# Patient Record
Sex: Male | Born: 1955 | Race: White | Hispanic: No | Marital: Married | Smoking: Current every day smoker
Health system: Southern US, Community
[De-identification: ages and names within clinical notes are randomized; demographics above are authoritative.]

## PROBLEM LIST (undated history)

## (undated) DIAGNOSIS — K8020 Calculus of gallbladder without cholecystitis without obstruction: Secondary | ICD-10-CM

## (undated) DIAGNOSIS — I255 Ischemic cardiomyopathy: Secondary | ICD-10-CM

## (undated) DIAGNOSIS — I219 Acute myocardial infarction, unspecified: Secondary | ICD-10-CM

## (undated) DIAGNOSIS — F329 Major depressive disorder, single episode, unspecified: Secondary | ICD-10-CM

## (undated) DIAGNOSIS — M545 Low back pain: Secondary | ICD-10-CM

## (undated) DIAGNOSIS — I428 Other cardiomyopathies: Secondary | ICD-10-CM

## (undated) DIAGNOSIS — I639 Cerebral infarction, unspecified: Secondary | ICD-10-CM

## (undated) DIAGNOSIS — M5137 Other intervertebral disc degeneration, lumbosacral region: Secondary | ICD-10-CM

## (undated) DIAGNOSIS — I2510 Atherosclerotic heart disease of native coronary artery without angina pectoris: Secondary | ICD-10-CM

## (undated) DIAGNOSIS — Z95810 Presence of automatic (implantable) cardiac defibrillator: Secondary | ICD-10-CM

## (undated) DIAGNOSIS — J189 Pneumonia, unspecified organism: Secondary | ICD-10-CM

## (undated) DIAGNOSIS — G8929 Other chronic pain: Secondary | ICD-10-CM

## (undated) DIAGNOSIS — J449 Chronic obstructive pulmonary disease, unspecified: Secondary | ICD-10-CM

## (undated) DIAGNOSIS — C3490 Malignant neoplasm of unspecified part of unspecified bronchus or lung: Secondary | ICD-10-CM

## (undated) DIAGNOSIS — E785 Hyperlipidemia, unspecified: Secondary | ICD-10-CM

## (undated) DIAGNOSIS — M51379 Other intervertebral disc degeneration, lumbosacral region without mention of lumbar back pain or lower extremity pain: Secondary | ICD-10-CM

## (undated) DIAGNOSIS — I679 Cerebrovascular disease, unspecified: Secondary | ICD-10-CM

## (undated) DIAGNOSIS — M1990 Unspecified osteoarthritis, unspecified site: Secondary | ICD-10-CM

## (undated) DIAGNOSIS — I509 Heart failure, unspecified: Secondary | ICD-10-CM

## (undated) DIAGNOSIS — G4730 Sleep apnea, unspecified: Secondary | ICD-10-CM

## (undated) HISTORY — DX: Cerebrovascular disease, unspecified: I67.9

## (undated) HISTORY — DX: Other cardiomyopathies: I42.8

## (undated) HISTORY — DX: Ischemic cardiomyopathy: I25.5

## (undated) HISTORY — DX: Sleep apnea, unspecified: G47.30

## (undated) HISTORY — DX: Atherosclerotic heart disease of native coronary artery without angina pectoris: I25.10

## (undated) HISTORY — DX: Malignant neoplasm of unspecified part of unspecified bronchus or lung: C34.90

## (undated) HISTORY — DX: Calculus of gallbladder without cholecystitis without obstruction: K80.20

## (undated) HISTORY — PX: BACK SURGERY: SHX140

---

## 1994-06-26 HISTORY — PX: POSTERIOR LUMBAR FUSION: SHX6036

## 2000-06-26 HISTORY — PX: KNEE ARTHROSCOPY: SHX127

## 2003-12-18 ENCOUNTER — Emergency Department (HOSPITAL_COMMUNITY): Admission: EM | Admit: 2003-12-18 | Discharge: 2003-12-18 | Payer: Self-pay | Admitting: Emergency Medicine

## 2009-12-05 ENCOUNTER — Emergency Department (HOSPITAL_COMMUNITY)
Admission: EM | Admit: 2009-12-05 | Discharge: 2009-12-05 | Payer: Self-pay | Source: Home / Self Care | Admitting: Emergency Medicine

## 2009-12-31 ENCOUNTER — Ambulatory Visit (HOSPITAL_COMMUNITY): Admission: RE | Admit: 2009-12-31 | Discharge: 2009-12-31 | Payer: Self-pay | Admitting: Family Medicine

## 2010-01-07 ENCOUNTER — Ambulatory Visit (HOSPITAL_COMMUNITY)
Admission: RE | Admit: 2010-01-07 | Discharge: 2010-01-07 | Payer: Self-pay | Source: Home / Self Care | Admitting: Family Medicine

## 2010-06-04 ENCOUNTER — Emergency Department (HOSPITAL_COMMUNITY)
Admission: EM | Admit: 2010-06-04 | Discharge: 2010-06-05 | Payer: Self-pay | Source: Home / Self Care | Admitting: Emergency Medicine

## 2010-06-05 ENCOUNTER — Emergency Department (HOSPITAL_COMMUNITY)
Admission: EM | Admit: 2010-06-05 | Discharge: 2010-06-05 | Payer: Self-pay | Source: Home / Self Care | Admitting: Emergency Medicine

## 2010-06-06 ENCOUNTER — Emergency Department (HOSPITAL_COMMUNITY)
Admission: EM | Admit: 2010-06-06 | Discharge: 2010-06-06 | Payer: Self-pay | Source: Home / Self Care | Admitting: Emergency Medicine

## 2010-06-26 HISTORY — PX: LUMBAR DISC SURGERY: SHX700

## 2010-08-10 ENCOUNTER — Other Ambulatory Visit (HOSPITAL_COMMUNITY): Payer: Self-pay | Admitting: Neurosurgery

## 2010-08-10 DIAGNOSIS — M545 Low back pain: Secondary | ICD-10-CM

## 2010-08-11 ENCOUNTER — Other Ambulatory Visit (HOSPITAL_COMMUNITY): Payer: Self-pay | Admitting: Neurosurgery

## 2010-08-11 DIAGNOSIS — M545 Low back pain: Secondary | ICD-10-CM

## 2010-08-12 ENCOUNTER — Ambulatory Visit (HOSPITAL_COMMUNITY): Payer: Medicaid Other

## 2010-08-12 ENCOUNTER — Other Ambulatory Visit: Payer: Self-pay | Admitting: General Surgery

## 2010-08-12 ENCOUNTER — Ambulatory Visit (HOSPITAL_COMMUNITY): Admission: RE | Admit: 2010-08-12 | Payer: Medicaid Other | Source: Ambulatory Visit

## 2010-08-12 ENCOUNTER — Encounter (HOSPITAL_COMMUNITY): Payer: Medicaid Other | Attending: General Surgery

## 2010-08-12 DIAGNOSIS — Z01812 Encounter for preprocedural laboratory examination: Secondary | ICD-10-CM | POA: Insufficient documentation

## 2010-08-12 LAB — BASIC METABOLIC PANEL
BUN: 7 mg/dL (ref 6–23)
CO2: 28 mEq/L (ref 19–32)
Chloride: 104 mEq/L (ref 96–112)
Creatinine, Ser: 1.02 mg/dL (ref 0.4–1.5)
GFR calc Af Amer: >60 mL/min (ref >60)
GFR calc non Af Amer: >60 mL/min (ref >60)
Glucose, Bld: 76 mg/dL (ref 70–99)
Potassium: 4.6 mEq/L (ref 3.5–5.1)
Sodium: 138 mEq/L (ref 135–145)

## 2010-08-12 LAB — CBC
MCH: 31.7 pg (ref 26.0–34.0)
Platelets: 312 10*3/uL (ref 150–400)
WBC: 8.1 10*3/uL (ref 4.0–10.5)

## 2010-08-12 LAB — SURGICAL PCR SCREEN
MRSA, PCR: NEGATIVE
Staphylococcus aureus: NEGATIVE

## 2010-08-19 ENCOUNTER — Ambulatory Visit (HOSPITAL_COMMUNITY)
Admission: RE | Admit: 2010-08-19 | Discharge: 2010-08-19 | Disposition: A | Discharge status: Home / Self Care | Payer: Medicaid Other | Source: Ambulatory Visit | Attending: General Surgery | Admitting: General Surgery

## 2010-08-19 ENCOUNTER — Other Ambulatory Visit: Payer: Self-pay | Admitting: General Surgery

## 2010-08-19 ENCOUNTER — Ambulatory Visit (HOSPITAL_COMMUNITY)
Admission: RE | Admit: 2010-08-19 | Discharge: 2010-08-19 | Disposition: A | Discharge status: Home / Self Care | Payer: Medicaid Other | Source: Ambulatory Visit | Attending: Neurosurgery | Admitting: Neurosurgery

## 2010-08-19 DIAGNOSIS — M545 Low back pain, unspecified: Secondary | ICD-10-CM | POA: Insufficient documentation

## 2010-08-19 DIAGNOSIS — M48061 Spinal stenosis, lumbar region without neurogenic claudication: Secondary | ICD-10-CM | POA: Insufficient documentation

## 2010-08-19 DIAGNOSIS — L723 Sebaceous cyst: Secondary | ICD-10-CM | POA: Insufficient documentation

## 2010-08-19 DIAGNOSIS — M519 Unspecified thoracic, thoracolumbar and lumbosacral intervertebral disc disorder: Secondary | ICD-10-CM | POA: Insufficient documentation

## 2010-08-19 DIAGNOSIS — M5126 Other intervertebral disc displacement, lumbar region: Secondary | ICD-10-CM | POA: Insufficient documentation

## 2010-09-01 ENCOUNTER — Other Ambulatory Visit: Payer: Self-pay | Admitting: Neurosurgery

## 2010-09-01 DIAGNOSIS — M5126 Other intervertebral disc displacement, lumbar region: Secondary | ICD-10-CM

## 2010-09-02 ENCOUNTER — Ambulatory Visit
Admission: RE | Admit: 2010-09-02 | Discharge: 2010-09-02 | Disposition: A | Discharge status: Home / Self Care | Payer: Medicaid Other | Source: Ambulatory Visit | Attending: Neurosurgery | Admitting: Neurosurgery

## 2010-09-02 DIAGNOSIS — M5126 Other intervertebral disc displacement, lumbar region: Secondary | ICD-10-CM

## 2010-09-02 NOTE — H&P (Signed)
  NAMEDAVIER, TRAMELL               ACCOUNT NO.:  [id number]  MEDICAL RECORD NO.:  [id number]           PATIENT TYPE:  LOCATION:                                 FACILITY:  PHYSICIAN:  Tilford Pillar, MD      DATE OF BIRTH:  [birth year]  DATE OF ADMISSION: DATE OF DISCHARGE:  LH                             HISTORY & PHYSICAL   CHIEF COMPLAINT:  Knot under right earlobe.  HISTORY OF PRESENT ILLNESS:  The patient is a 55 year old male with suddenly a history of a knot underneath his right earlobe, this had not significantly changed in size.  He has had no discharge.  No erythema. No significant pain symptomatology.  This is become more of a new symptoms and irritation, concern due to the size.  PAST MEDICAL HISTORY: 1. Hypertension. 2. Back pain. 3. Hypercholesteremia.  PAST SURGICAL HISTORY:  Back surgery.  MEDICATIONS:  Cyclobenzaprine, hydrocodone, Zocor, has been on a recent prednisone.  ALLERGIES:  MORPHINE, which causes itching.  SOCIAL HISTORY:  Two-pack per day smoker.  No alcohol.  No recreational drug abuse.  He is currently not employed.  PERTINENT FAMILY HISTORY:  Diabetes mellitus, otherwise unremarkable.  REVIEW OF SYSTEMS:  CONSTITUTIONAL:  Unremarkable.  EYES:  Unremarkable. EARS, NOSE, AND THROAT:  Complaints of rhinorrhea.  RESPIRATORY:  Cough and wheezing.  CARDIOVASCULAR:  Unremarkable.  GASTROINTESTINAL: Unremarkable.  GENITOURINARY:  Unremarkable.  MUSCULOSKELETAL: Arthralgias of the back, neck and joints.  SKIN:  Unremarkable. ENDOCRINE:  Unremarkable.  NEUROLOGIC:  Paresthesias of bilateral legs.  PHYSICAL EXAMINATION:  GENERAL:  The patient is healthy, calm-appearing male.  No acute distress, he is alert and oriented x3. HEENT:  Scalp, no deformities, no masses.  Eyes; pupils are equal, round, and reactive to light.  Extraocular movements are intact.  No scleral icterus or conjunctival pallor is noted along the inferior aspect of the right  earlobe.  There is a mobile, soft, nontender, rubbery nodule, no discharges noted.  Oral mucosa is pink.  Normal occlusion. NECK:  Trachea is midline.  No cervical lymphadenopathy. PULMONARY:  Unlabored respiration.  No wheezes or crackles.  He is clear to auscultation bilaterally. CARDIOVASCULAR:  Regular rate and rhythm.  No murmurs or gallops are apparent.  He has no chest wall deformities. ABDOMEN:  Positive bowel sounds.  Abdomen is soft and nontender. EXTREMITIES:  Warm and dry.  ASSESSMENT AND PLAN:  Sebaceous cyst, subauricular of the right face.  Plan at this point is to proceed with excision.  The risks, benefits, and alternatives were discussed at length with the patient including both excision and continued conservative measures.  At this point, the patient does wish to proceed with excision and we will plan to proceed at his earliest convenience.     Tilford Pillar, MD     BZ/MEDQ  D:  08/18/2010  T:  08/18/2010  Job:  [id number]  Electronically Signed by Tilford Pillar MD on 09/01/2010 09:30:42 PM

## 2010-09-05 LAB — BASIC METABOLIC PANEL
Glucose, Bld: 115 mg/dL — ABNORMAL HIGH (ref 70–99)
Potassium: 4.5 mEq/L (ref 3.5–5.1)

## 2010-09-05 LAB — CBC
Hemoglobin: 15.6 g/dL (ref 13.0–17.0)
MCHC: 37.1 g/dL — ABNORMAL HIGH (ref 30.0–36.0)
MCV: 93.3 fL (ref 78.0–100.0)
WBC: 11.9 10*3/uL — ABNORMAL HIGH (ref 4.0–10.5)

## 2010-09-05 LAB — DIFFERENTIAL
Basophils Absolute: 0.1 10*3/uL (ref 0.0–0.1)
Basophils Relative: 1 % (ref 0–1)
Eosinophils Absolute: 0.2 10*3/uL (ref 0.0–0.7)
Lymphs Abs: 2.7 10*3/uL (ref 0.7–4.0)
Monocytes Relative: 8 % (ref 3–12)
Neutrophils Relative %: 66 % (ref 43–77)

## 2010-09-05 LAB — HEPATIC FUNCTION PANEL
AST: 33 U/L (ref 0–37)
Albumin: 4 g/dL (ref 3.5–5.2)
Alkaline Phosphatase: 76 U/L (ref 39–117)
Indirect Bilirubin: 0.4 mg/dL (ref 0.3–0.9)
Total Bilirubin: 0.5 mg/dL (ref 0.3–1.2)

## 2010-09-06 LAB — CBC
HCT: 39.5 % (ref 39.0–52.0)
Hemoglobin: 13.6 g/dL (ref 13.0–17.0)
MCH: 32.7 pg (ref 26.0–34.0)
MCHC: 34.4 g/dL (ref 30.0–36.0)
Platelets: 188 10*3/uL (ref 150–400)

## 2010-09-06 LAB — DIFFERENTIAL
Basophils Relative: 1 % (ref 0–1)
Eosinophils Absolute: 0.2 10*3/uL (ref 0.0–0.7)
Eosinophils Relative: 2 % (ref 0–5)
Monocytes Absolute: 0.7 10*3/uL (ref 0.1–1.0)
Monocytes Relative: 7 % (ref 3–12)
Neutro Abs: 7.3 10*3/uL (ref 1.7–7.7)

## 2010-09-06 LAB — POCT I-STAT, CHEM 8
HCT: 42 % (ref 39.0–52.0)
Hemoglobin: 14.3 g/dL (ref 13.0–17.0)
Potassium: 4.2 mEq/L (ref 3.5–5.1)

## 2010-09-23 NOTE — Op Note (Signed)
  NAMEGREELY, Charles Savage               ACCOUNT NO.:  [id number]  MEDICAL RECORD NO.:  [id number]           PATIENT TYPE:  LOCATION:                                 FACILITY:  PHYSICIAN:  Tilford Pillar, MD      DATE OF BIRTH:  08/02/1955  DATE OF PROCEDURE:  08/18/2010 DATE OF DISCHARGE:                              OPERATIVE REPORT   PREOPERATIVE DIAGNOSIS:  Sebaceous cyst of the right subauricular face.  POSTOPERATIVE DIAGNOSIS:  Sebaceous cyst of the right subauricular face.  PROCEDURE:  Excision of cyst via 2-cm incision.  SURGEON:  Tilford Pillar, MD  ANESTHESIA:  MAC sedation with local anesthetic.  Local anesthetic 1% lidocaine plain.  SPECIMEN:  Cyst.  ESTIMATED BLOOD LOSS:  Minimal.  COMPLICATIONS:  None.  INDICATIONS:  The patient is a 55 year old black male who presented to my office with a history of a nodule on his right ear.  This had slowly increased in size, and on evaluation, was consistent with a sebaceous cyst.  Risks, benefits and alternatives of excision were discussed at length with the patient including, but not limited to risk of bleeding, infection and recurrence.  His questions and concerns were addressed. The patient was consented for planned procedure.  OPERATION:  The patient was taken to the operating room, placed in supine position on the operating table at which time the MAC sedation was administered.  Once he was asleep, his right ear and face were prepped with Betadine solution and draped in standard fashion.  Local anesthetic was instilled and an elliptical incision was created over the cyst.  Careful dissection was carried out using combination of electrocautery and sharp dissection to free the cyst circumferentially. Once it was free, it was placed into the back table and sent as a permanent specimen to Pathology.  At this time, hemostasis was excellent.  I used a 3-0 Vicryl for the deep subcutaneous tissue and then 4-0 Monocryl in a  running subcuticular suture to reapproximate the skin edges.  Skin was washed, dried with moist dry towel.  Dermabond was placed over the wound and the patient was allowed to come out of sedation, he was transferred back to recovery area in stable condition.  At the conclusion of procedure, all instrument, sponge and needle counts were correct.  The patient tolerated the procedure extremely well.     Tilford Pillar, MD     BZ/MEDQ  D:  09/02/2010  T:  09/03/2010  Job:  [id number]  cc:   Primary Care Physician  Electronically Signed by Tilford Pillar MD on 09/23/2010 12:20:17 PM

## 2010-09-29 ENCOUNTER — Other Ambulatory Visit: Payer: Self-pay | Admitting: Neurosurgery

## 2010-09-29 DIAGNOSIS — M5126 Other intervertebral disc displacement, lumbar region: Secondary | ICD-10-CM

## 2010-10-06 ENCOUNTER — Ambulatory Visit
Admission: RE | Admit: 2010-10-06 | Discharge: 2010-10-06 | Disposition: A | Discharge status: Home / Self Care | Payer: Medicaid Other | Source: Ambulatory Visit | Attending: Neurosurgery | Admitting: Neurosurgery

## 2010-10-06 DIAGNOSIS — M5126 Other intervertebral disc displacement, lumbar region: Secondary | ICD-10-CM

## 2010-10-07 ENCOUNTER — Other Ambulatory Visit: Payer: Medicaid Other

## 2010-10-07 ENCOUNTER — Other Ambulatory Visit (HOSPITAL_COMMUNITY): Payer: Self-pay | Admitting: Family Medicine

## 2010-10-07 DIAGNOSIS — R9389 Abnormal findings on diagnostic imaging of other specified body structures: Secondary | ICD-10-CM

## 2010-10-11 ENCOUNTER — Other Ambulatory Visit (HOSPITAL_COMMUNITY): Payer: Medicaid Other

## 2010-10-14 ENCOUNTER — Ambulatory Visit (HOSPITAL_COMMUNITY)
Admission: RE | Admit: 2010-10-14 | Discharge: 2010-10-14 | Disposition: A | Discharge status: Home / Self Care | Payer: Medicaid Other | Source: Ambulatory Visit | Attending: Family Medicine | Admitting: Family Medicine

## 2010-10-14 DIAGNOSIS — F17200 Nicotine dependence, unspecified, uncomplicated: Secondary | ICD-10-CM | POA: Insufficient documentation

## 2010-10-14 DIAGNOSIS — J479 Bronchiectasis, uncomplicated: Secondary | ICD-10-CM | POA: Insufficient documentation

## 2010-10-14 DIAGNOSIS — J984 Other disorders of lung: Secondary | ICD-10-CM | POA: Insufficient documentation

## 2010-10-14 DIAGNOSIS — R918 Other nonspecific abnormal finding of lung field: Secondary | ICD-10-CM | POA: Insufficient documentation

## 2010-10-14 DIAGNOSIS — R9389 Abnormal findings on diagnostic imaging of other specified body structures: Secondary | ICD-10-CM

## 2010-11-08 ENCOUNTER — Other Ambulatory Visit: Payer: Self-pay | Admitting: Neurosurgery

## 2010-11-08 DIAGNOSIS — M5126 Other intervertebral disc displacement, lumbar region: Secondary | ICD-10-CM

## 2010-11-11 ENCOUNTER — Ambulatory Visit
Admission: RE | Admit: 2010-11-11 | Discharge: 2010-11-11 | Disposition: A | Discharge status: Home / Self Care | Payer: Medicaid Other | Source: Ambulatory Visit | Attending: Neurosurgery | Admitting: Neurosurgery

## 2010-11-11 DIAGNOSIS — M5126 Other intervertebral disc displacement, lumbar region: Secondary | ICD-10-CM

## 2011-02-10 ENCOUNTER — Other Ambulatory Visit (HOSPITAL_COMMUNITY): Payer: Self-pay | Admitting: Internal Medicine

## 2011-02-10 DIAGNOSIS — J449 Chronic obstructive pulmonary disease, unspecified: Secondary | ICD-10-CM

## 2011-02-13 ENCOUNTER — Ambulatory Visit (HOSPITAL_COMMUNITY): Payer: Medicaid Other

## 2011-02-16 ENCOUNTER — Ambulatory Visit (HOSPITAL_COMMUNITY)
Admission: RE | Admit: 2011-02-16 | Discharge: 2011-02-16 | Disposition: A | Discharge status: Home / Self Care | Payer: Medicaid Other | Source: Ambulatory Visit | Attending: Internal Medicine | Admitting: Internal Medicine

## 2011-02-16 ENCOUNTER — Encounter (HOSPITAL_COMMUNITY): Payer: Self-pay

## 2011-02-16 DIAGNOSIS — F17200 Nicotine dependence, unspecified, uncomplicated: Secondary | ICD-10-CM | POA: Insufficient documentation

## 2011-02-16 DIAGNOSIS — J449 Chronic obstructive pulmonary disease, unspecified: Secondary | ICD-10-CM | POA: Insufficient documentation

## 2011-02-16 DIAGNOSIS — J984 Other disorders of lung: Secondary | ICD-10-CM | POA: Insufficient documentation

## 2011-02-16 DIAGNOSIS — R0789 Other chest pain: Secondary | ICD-10-CM | POA: Insufficient documentation

## 2011-02-16 DIAGNOSIS — J4489 Other specified chronic obstructive pulmonary disease: Secondary | ICD-10-CM | POA: Insufficient documentation

## 2011-02-16 DIAGNOSIS — R0602 Shortness of breath: Secondary | ICD-10-CM | POA: Insufficient documentation

## 2011-02-16 MED ORDER — IOHEXOL 300 MG/ML  SOLN
80.0000 mL | Freq: Once | INTRAMUSCULAR | Status: AC | PRN
  Administered 2011-02-16: 80 mL via INTRAVENOUS

## 2011-02-22 ENCOUNTER — Encounter (HOSPITAL_COMMUNITY): Payer: Self-pay | Admitting: *Deleted

## 2011-02-22 ENCOUNTER — Emergency Department (HOSPITAL_COMMUNITY)
Admission: EM | Admit: 2011-02-22 | Discharge: 2011-02-23 | Disposition: A | Discharge status: Home / Self Care | Payer: Medicaid Other | Attending: Emergency Medicine | Admitting: Emergency Medicine

## 2011-02-22 DIAGNOSIS — F17200 Nicotine dependence, unspecified, uncomplicated: Secondary | ICD-10-CM | POA: Insufficient documentation

## 2011-02-22 DIAGNOSIS — L272 Dermatitis due to ingested food: Secondary | ICD-10-CM | POA: Insufficient documentation

## 2011-02-22 DIAGNOSIS — T7840XA Allergy, unspecified, initial encounter: Secondary | ICD-10-CM

## 2011-02-22 MED ORDER — FAMOTIDINE IN NACL 20-0.9 MG/50ML-% IV SOLN
20.0000 mg | Freq: Once | INTRAVENOUS | Status: AC
  Administered 2011-02-22: 20 mg via INTRAVENOUS
  Filled 2011-02-22: qty 50

## 2011-02-22 MED ORDER — METHYLPREDNISOLONE SODIUM SUCC 125 MG IJ SOLR
125.0000 mg | Freq: Once | INTRAMUSCULAR | Status: AC
  Administered 2011-02-22: 125 mg via INTRAVENOUS
  Filled 2011-02-22: qty 2

## 2011-02-22 MED ORDER — DIPHENHYDRAMINE HCL 50 MG/ML IJ SOLN
25.0000 mg | Freq: Once | INTRAMUSCULAR | Status: AC
  Administered 2011-02-22: 25 mg via INTRAVENOUS
  Filled 2011-02-22: qty 1

## 2011-02-22 NOTE — ED Notes (Signed)
Pt states he ate a peach pie and 10 mins later broke out in rash and itching all over; pt states he throat feels very dry

## 2011-02-23 MED ORDER — PREDNISONE 10 MG PO TABS: 20.0000 mg | ORAL_TABLET | Freq: Every day | ORAL | Status: AC

## 2011-02-23 NOTE — ED Provider Notes (Signed)
History     CSN: [id number] Arrival date & time: 02/22/2011 11:15 PM  Chief Complaint  Patient presents with  . Allergic Reaction   HPI Comments: Seen 0003.  Patient is a 55 y.o. male presenting with allergic reaction. The history is provided by the patient and the spouse.  Allergic Reaction The primary symptoms are  rash and urticaria. The primary symptoms do not include wheezing, shortness of breath, cough, abdominal pain, nausea, vomiting, diarrhea, dizziness, palpitations or altered mental status. Primary symptoms comment: Patient ate a peach cobler pie and within 30 minutes developed severe itching and hives. The current episode started less than 1 hour ago. The problem has been rapidly worsening. This is a new problem.  The rash began today. Location: over entire body. The rash is associated with itching.  The urticaria began less than 1 hour ago.  The onset of the reaction was associated with eating. Significant symptoms also include itching. Significant symptoms that are not present include eye redness, flushing or rhinorrhea.    History reviewed. No pertinent past medical history.  Past Surgical History  Procedure Date  . Back surgery     History reviewed. No pertinent family history.  History  Substance Use Topics  . Smoking status: Current Everyday Smoker  . Smokeless tobacco: Not on file  . Alcohol Use: No      Review of Systems  HENT: Negative for rhinorrhea.   Eyes: Negative for redness.  Respiratory: Negative for cough, choking, chest tightness, shortness of breath and wheezing.   Cardiovascular: Negative for chest pain and palpitations.  Gastrointestinal: Negative for nausea, vomiting, abdominal pain and diarrhea.  Skin: Positive for itching and rash. Negative for flushing.       itching  Neurological: Negative for dizziness.  Psychiatric/Behavioral: Negative for altered mental status.  All other systems reviewed and are negative.    Physical Exam    BP 128/75  Pulse 111  Resp 20  Ht 5\' 9"  (1.753 m)  Wt 209 lb (94.802 kg)  BMI 30.86 kg/m2  SpO2 98%  Physical Exam  Nursing note and vitals reviewed. Constitutional: He is oriented to person, place, and time. He appears well-developed and well-nourished.       Itching and scratching.  HENT:  Head: Normocephalic and atraumatic.  Nose: Nose normal.  Mouth/Throat: Oropharynx is clear and moist.       No stridor  Eyes: EOM are normal.  Neck: Normal range of motion. Neck supple.  Cardiovascular: Normal rate, normal heart sounds and intact distal pulses.   Pulmonary/Chest: Effort normal.  Abdominal: Bowel sounds are normal.  Musculoskeletal: Normal range of motion.  Neurological: He is alert and oriented to person, place, and time.  Skin:       Hives to trunk, arms, legs, neck.  Psychiatric: He has a normal mood and affect.    ED Course  Procedures  Patient with acute allergic response after eating peach cobbler. Received solumedrol, pepcid, benadyl. Urticaria resolved. Itching improved.Patient observed in the ER. Pt feels improved after observation and/or treatment in ED. Pt stable in ED with no significant deterioration in condition.  MDM Reviewed: nursing note and vitals Total time providing critical care: 30-74 minutes. This excludes time spent performing separately reportable procedures and services.         Nicoletta Dress. Colon Branch, MD 02/23/11 [id number]

## 2011-02-23 NOTE — ED Notes (Signed)
Pt sleeping. 

## 2011-02-23 NOTE — ED Notes (Signed)
Pt self ambulated out with a steady gait stating no needs 

## 2011-05-24 ENCOUNTER — Ambulatory Visit (HOSPITAL_COMMUNITY)
Admission: RE | Admit: 2011-05-24 | Discharge: 2011-05-24 | Disposition: A | Discharge status: Home / Self Care | Payer: Medicaid Other | Source: Ambulatory Visit | Attending: Specialist | Admitting: Specialist

## 2011-05-24 ENCOUNTER — Other Ambulatory Visit (HOSPITAL_COMMUNITY): Payer: Self-pay | Admitting: Specialist

## 2011-05-24 DIAGNOSIS — IMO0002 Reserved for concepts with insufficient information to code with codable children: Secondary | ICD-10-CM

## 2011-05-24 DIAGNOSIS — S0550XA Penetrating wound with foreign body of unspecified eyeball, initial encounter: Secondary | ICD-10-CM | POA: Insufficient documentation

## 2011-05-24 DIAGNOSIS — X58XXXA Exposure to other specified factors, initial encounter: Secondary | ICD-10-CM | POA: Insufficient documentation

## 2011-06-01 ENCOUNTER — Encounter (HOSPITAL_BASED_OUTPATIENT_CLINIC_OR_DEPARTMENT_OTHER): Payer: Self-pay | Admitting: *Deleted

## 2011-06-01 NOTE — Progress Notes (Signed)
Pt had lumbar surg 9/12 [facility] Still severe back pain Has a piece metal in lt cheek needs to come out so he can have an mri No labs per anesth needed

## 2011-06-01 NOTE — H&P (Signed)
Charles Savage is an 55 y.o. male.   Chief Complaint: foreign body to face HPI:   Past Medical History  Diagnosis Date  . Arthritis   . Chronic pain     back pain    Past Surgical History  Procedure Date  . Back surgery 1610,9604  . Knee arthroscopy 2002    rt    History reviewed. No pertinent family history. Social History:  reports that he has been smoking.  He does not have any smokeless tobacco history on file. He reports that he does not drink alcohol or use illicit drugs.  Allergies:  Allergies  Allergen Reactions  . Morphine And Related Hives and Itching    No current facility-administered medications on file as of .   Medications Prior to Admission  Medication Sig Dispense Refill  . cyclobenzaprine (FLEXERIL) 10 MG tablet Take 10 mg by mouth 3 (three) times daily as needed.        Marland Kitchen HYDROcodone-acetaminophen (NORCO) 10-325 MG per tablet Take 1 tablet by mouth every 6 (six) hours as needed.        . meloxicam (MOBIC) 15 MG tablet Take 15 mg by mouth as needed.          No results found for this or any previous visit (from the past 48 hour(s)). No results found.  Review of Systems  Constitutional: Negative for fever, chills, weight loss, malaise/fatigue and diaphoresis.  HENT: Negative for hearing loss, ear pain, nosebleeds, congestion, sore throat, neck pain, tinnitus and ear discharge.   Eyes: Positive for double vision. Negative for blurred vision, photophobia, pain, discharge and redness.  Respiratory: Negative for cough, hemoptysis, sputum production, shortness of breath, wheezing and stridor.   Cardiovascular: Negative for chest pain, palpitations, orthopnea, claudication, leg swelling and PND.  Gastrointestinal: Negative for heartburn, nausea, vomiting, abdominal pain, diarrhea, constipation, blood in stool and melena.  Genitourinary: Negative for dysuria, urgency, frequency, hematuria and flank pain.  Musculoskeletal: Positive for back pain and joint  pain. Negative for myalgias and falls.  Skin: Negative for itching and rash.  Neurological: Negative for dizziness, tingling, tremors, sensory change, speech change, focal weakness, seizures, loss of consciousness, weakness and headaches.  Endo/Heme/Allergies: Negative for environmental allergies and polydipsia. Does not bruise/bleed easily.  Psychiatric/Behavioral: Negative for depression, suicidal ideas, hallucinations, memory loss and substance abuse. The patient is not nervous/anxious and does not have insomnia.   All other systems reviewed and are negative.    There were no vitals taken for this visit. Physical Exam  Constitutional: He is oriented to person, place, and time. He appears well-developed and well-nourished.  HENT:  Head: Normocephalic and atraumatic.    Right Ear: External ear normal.  Left Ear: External ear normal.  Nose: Nose normal.  Mouth/Throat: Oropharynx is clear and moist. No oropharyngeal exudate.  Eyes: Conjunctivae are normal. Pupils are equal, round, and reactive to light. Right eye exhibits no discharge. Left eye exhibits no discharge. No scleral icterus.  Neck: Normal range of motion. No JVD present. No tracheal deviation present. No thyromegaly present.  Cardiovascular: Normal rate, regular rhythm, normal heart sounds and intact distal pulses.  Exam reveals no gallop and no friction rub.   No murmur heard. Respiratory: Effort normal and breath sounds normal. No respiratory distress. He has no wheezes. He has no rales. He exhibits no tenderness.  GI: Soft. Bowel sounds are normal. He exhibits no distension and no mass. There is no tenderness. There is no rebound and no guarding.  Musculoskeletal: Normal range of motion. He exhibits no edema and no tenderness.  Lymphadenopathy:    He has no cervical adenopathy.  Neurological: He is alert and oriented to person, place, and time. He has normal reflexes. He displays normal reflexes. No cranial nerve deficit.  He exhibits normal muscle tone. Coordination normal.  Skin: Skin is warm and dry. No rash noted. No erythema. No pallor.  Psychiatric: He has a normal mood and affect. His behavior is normal. Judgment and thought content normal.     Assessment/Plan Removal of foreign body and repair of fracture  Dyanara Cozza L 06/01/2011, 12:09 PM

## 2011-06-02 ENCOUNTER — Encounter (HOSPITAL_BASED_OUTPATIENT_CLINIC_OR_DEPARTMENT_OTHER)
Admission: RE | Admit: 2011-06-02 | Discharge: 2011-06-02 | Disposition: A | Discharge status: Home / Self Care | Payer: Medicaid Other | Source: Ambulatory Visit | Attending: Specialist | Admitting: Specialist

## 2011-06-02 LAB — CBC
Hemoglobin: 14.4 g/dL (ref 13.0–17.0)
MCH: 33.2 pg (ref 26.0–34.0)
MCHC: 35.8 g/dL (ref 30.0–36.0)
Platelets: 263 10*3/uL (ref 150–400)
RDW: 13.1 % (ref 11.5–15.5)

## 2011-06-02 LAB — BASIC METABOLIC PANEL
BUN: 8 mg/dL (ref 6–23)
CO2: 27 mEq/L (ref 19–32)
Chloride: 103 mEq/L (ref 96–112)
Creatinine, Ser: 0.81 mg/dL (ref 0.50–1.35)
Potassium: 4.3 mEq/L (ref 3.5–5.1)
Sodium: 138 mEq/L (ref 135–145)

## 2011-06-05 ENCOUNTER — Encounter (HOSPITAL_BASED_OUTPATIENT_CLINIC_OR_DEPARTMENT_OTHER)
Admission: RE | Disposition: A | Discharge status: Home / Self Care | Payer: Self-pay | Source: Ambulatory Visit | Attending: Specialist

## 2011-06-05 ENCOUNTER — Other Ambulatory Visit: Payer: Self-pay | Admitting: Specialist

## 2011-06-05 ENCOUNTER — Ambulatory Visit (HOSPITAL_BASED_OUTPATIENT_CLINIC_OR_DEPARTMENT_OTHER): Payer: Medicaid Other | Admitting: Anesthesiology

## 2011-06-05 ENCOUNTER — Ambulatory Visit (HOSPITAL_BASED_OUTPATIENT_CLINIC_OR_DEPARTMENT_OTHER)
Admission: RE | Admit: 2011-06-05 | Discharge: 2011-06-05 | Disposition: A | Discharge status: Home / Self Care | Payer: Medicaid Other | Source: Ambulatory Visit | Attending: Specialist | Admitting: Specialist

## 2011-06-05 ENCOUNTER — Encounter (HOSPITAL_BASED_OUTPATIENT_CLINIC_OR_DEPARTMENT_OTHER): Payer: Self-pay | Admitting: Anesthesiology

## 2011-06-05 ENCOUNTER — Encounter (HOSPITAL_BASED_OUTPATIENT_CLINIC_OR_DEPARTMENT_OTHER): Payer: Self-pay | Admitting: Certified Registered"

## 2011-06-05 ENCOUNTER — Encounter (HOSPITAL_BASED_OUTPATIENT_CLINIC_OR_DEPARTMENT_OTHER): Payer: Self-pay

## 2011-06-05 DIAGNOSIS — Z01812 Encounter for preprocedural laboratory examination: Secondary | ICD-10-CM | POA: Insufficient documentation

## 2011-06-05 DIAGNOSIS — H0550 Retained (old) foreign body following penetrating wound of unspecified orbit: Secondary | ICD-10-CM | POA: Insufficient documentation

## 2011-06-05 HISTORY — DX: Unspecified osteoarthritis, unspecified site: M19.90

## 2011-06-05 HISTORY — PX: FOREIGN BODY REMOVAL: SHX962

## 2011-06-05 SURGERY — FOREIGN BODY REMOVAL ADULT
Anesthesia: General | Site: Face | Laterality: Right | Wound class: Clean

## 2011-06-05 MED ORDER — CEFAZOLIN SODIUM 1-5 GM-% IV SOLN
INTRAVENOUS | Status: DC | PRN
  Administered 2011-06-05: 2 g via INTRAVENOUS

## 2011-06-05 MED ORDER — FENTANYL CITRATE 0.05 MG/ML IJ SOLN: 50.0000 ug | INTRAMUSCULAR | Status: DC | PRN

## 2011-06-05 MED ORDER — GLYCOPYRROLATE 0.2 MG/ML IJ SOLN
INTRAMUSCULAR | Status: DC | PRN
  Administered 2011-06-05: .6 mg via INTRAVENOUS

## 2011-06-05 MED ORDER — DEXAMETHASONE SODIUM PHOSPHATE 4 MG/ML IJ SOLN
INTRAMUSCULAR | Status: DC | PRN
  Administered 2011-06-05: 10 mg via INTRAVENOUS

## 2011-06-05 MED ORDER — FENTANYL CITRATE 0.05 MG/ML IJ SOLN
INTRAMUSCULAR | Status: DC | PRN
  Administered 2011-06-05: 50 ug via INTRAVENOUS

## 2011-06-05 MED ORDER — LACTATED RINGERS IV SOLN
INTRAVENOUS | Status: DC
  Administered 2011-06-05 (×2): via INTRAVENOUS

## 2011-06-05 MED ORDER — PROPOFOL 10 MG/ML IV EMUL
INTRAVENOUS | Status: DC | PRN
  Administered 2011-06-05: 200 mg via INTRAVENOUS

## 2011-06-05 MED ORDER — ROCURONIUM BROMIDE 100 MG/10ML IV SOLN
INTRAVENOUS | Status: DC | PRN
  Administered 2011-06-05: 50 mg via INTRAVENOUS

## 2011-06-05 MED ORDER — LIDOCAINE-EPINEPHRINE 0.5-1:200000 % IJ SOLN
INTRAMUSCULAR | Status: DC | PRN
  Administered 2011-06-05: 9 mL

## 2011-06-05 MED ORDER — NEOSTIGMINE METHYLSULFATE 1 MG/ML IJ SOLN
INTRAMUSCULAR | Status: DC | PRN
  Administered 2011-06-05: 4 mg via INTRAVENOUS

## 2011-06-05 MED ORDER — FENTANYL CITRATE 0.05 MG/ML IJ SOLN: 25.0000 ug | INTRAMUSCULAR | Status: DC | PRN

## 2011-06-05 MED ORDER — MIDAZOLAM HCL 2 MG/2ML IJ SOLN: 0.5000 mg | INTRAMUSCULAR | Status: DC | PRN

## 2011-06-05 MED ORDER — ONDANSETRON HCL 4 MG/2ML IJ SOLN
INTRAMUSCULAR | Status: DC | PRN
  Administered 2011-06-05: 4 mg via INTRAVENOUS

## 2011-06-05 MED ORDER — LIDOCAINE HCL (CARDIAC) 20 MG/ML IV SOLN
INTRAVENOUS | Status: DC | PRN
  Administered 2011-06-05: 60 mg via INTRAVENOUS

## 2011-06-05 MED ORDER — METOCLOPRAMIDE HCL 5 MG/ML IJ SOLN: 10.0000 mg | Freq: Once | INTRAMUSCULAR | Status: DC | PRN

## 2011-06-05 MED ORDER — MIDAZOLAM HCL 5 MG/5ML IJ SOLN
INTRAMUSCULAR | Status: DC | PRN
  Administered 2011-06-05: 1 mg via INTRAVENOUS

## 2011-06-05 SURGICAL SUPPLY — 49 items
BANDAGE GAUZE ELAST BULKY 4 IN (GAUZE/BANDAGES/DRESSINGS) IMPLANT
BENZOIN TINCTURE PRP APPL 2/3 (GAUZE/BANDAGES/DRESSINGS) ×2 IMPLANT
BLADE KNIFE PERSONA 10 (BLADE) IMPLANT
BLADE KNIFE PERSONA 15 (BLADE) ×2 IMPLANT
CANISTER SUCTION 1200CC (MISCELLANEOUS) IMPLANT
CLEANER CAUTERY TIP 5X5 PAD (MISCELLANEOUS) IMPLANT
CLOTH BEACON ORANGE TIMEOUT ST (SAFETY) ×2 IMPLANT
COTTONBALL LRG STERILE PKG (GAUZE/BANDAGES/DRESSINGS) IMPLANT
COVER MAYO STAND STRL (DRAPES) ×2 IMPLANT
COVER TABLE BACK 60X90 (DRAPES) ×2 IMPLANT
DRAPE PED LAPAROTOMY (DRAPES) IMPLANT
DRAPE U-SHAPE 76X120 STRL (DRAPES) IMPLANT
DRSG PAD ABDOMINAL 8X10 ST (GAUZE/BANDAGES/DRESSINGS) IMPLANT
ELECT NEEDLE TIP 2.8 STRL (NEEDLE) ×2 IMPLANT
ELECT REM PT RETURN 9FT ADLT (ELECTROSURGICAL) ×2
ELECTRODE REM PT RTRN 9FT ADLT (ELECTROSURGICAL) ×1 IMPLANT
GAUZE SPONGE 4X4 12PLY STRL LF (GAUZE/BANDAGES/DRESSINGS) IMPLANT
GAUZE SPONGE 4X4 16PLY XRAY LF (GAUZE/BANDAGES/DRESSINGS) IMPLANT
GAUZE XEROFORM 1X8 LF (GAUZE/BANDAGES/DRESSINGS) ×2 IMPLANT
GAUZE XEROFORM 5X9 LF (GAUZE/BANDAGES/DRESSINGS) IMPLANT
GLOVE BIO SURGEON STRL SZ 6.5 (GLOVE) ×4 IMPLANT
GLOVE BIO SURGEON STRL SZ7 (GLOVE) ×4 IMPLANT
GLOVE ECLIPSE 7.0 STRL STRAW (GLOVE) ×2 IMPLANT
GOWN PREVENTION PLUS XLARGE (GOWN DISPOSABLE) IMPLANT
GOWN PREVENTION PLUS XXLARGE (GOWN DISPOSABLE) ×4 IMPLANT
NEEDLE HYPO 25X1 1.5 SAFETY (NEEDLE) ×2 IMPLANT
PACK BASIN DAY SURGERY FS (CUSTOM PROCEDURE TRAY) ×2 IMPLANT
PAD CLEANER CAUTERY TIP 5X5 (MISCELLANEOUS)
PENCIL BUTTON HOLSTER BLD 10FT (ELECTRODE) ×2 IMPLANT
SHEET MEDIUM DRAPE 40X70 STRL (DRAPES) IMPLANT
SPONGE GAUZE 2X2 8PLY STRL LF (GAUZE/BANDAGES/DRESSINGS) ×2 IMPLANT
SPONGE GAUZE 4X4 12PLY (GAUZE/BANDAGES/DRESSINGS) IMPLANT
STAPLER VISISTAT 35W (STAPLE) IMPLANT
STERI STRIP BROWN 1/4X3 R155 (GAUZE/BANDAGES/DRESSINGS) ×2 IMPLANT
STRIP SUTURE WOUND CLOSURE 1/2 (SUTURE) IMPLANT
SUCTION FRAZIER TIP 10 FR DISP (SUCTIONS) IMPLANT
SUT MNCRL AB 3-0 PS2 18 (SUTURE) ×2 IMPLANT
SUT PROLENE 4 0 P 3 18 (SUTURE) IMPLANT
SUT PROLENE 4 0 PS 2 18 (SUTURE) IMPLANT
SUT SILK 3 0 PS 1 (SUTURE) IMPLANT
SUT SILK 6 0 P 1 (SUTURE) ×2 IMPLANT
SUT VIC AB 3-0 FS2 27 (SUTURE) IMPLANT
SYR CONTROL 10ML LL (SYRINGE) ×2 IMPLANT
TAPE HYPAFIX 4 X10 (GAUZE/BANDAGES/DRESSINGS) ×2 IMPLANT
TAPE HYPAFIX 6X30 (GAUZE/BANDAGES/DRESSINGS) IMPLANT
TOWEL OR 17X24 6PK STRL BLUE (TOWEL DISPOSABLE) ×6 IMPLANT
TUBE CONNECTING 20X1/4 (TUBING) IMPLANT
UNDERPAD 30X30 INCONTINENT (UNDERPADS AND DIAPERS) ×2 IMPLANT
WATER STERILE IRR 1000ML POUR (IV SOLUTION) IMPLANT

## 2011-06-05 NOTE — Interval H&P Note (Signed)
History and Physical Interval Note:  06/05/2011 9:07 AM  Charles Savage  has presented today for surgery, with the diagnosis of Retained foreign body (metal)  The various methods of treatment have been discussed with the patient and family. After consideration of risks, benefits and other options for treatment, the patient has consented to  Procedure(s): FOREIGN BODY REMOVAL ADULT as a surgical intervention .  The patients' history has been reviewed, patient examined, no change in status, stable for surgery.  I have reviewed the patients' chart and labs.  Questions were answered to the patient's satisfaction.     [name] L

## 2011-06-05 NOTE — Anesthesia Preprocedure Evaluation (Signed)
Anesthesia Evaluation  Patient identified by MRN, date of birth, ID band Patient awake    Reviewed: Allergy & Precautions, H&P , NPO status , [name] Chart, lab work & pertinent test results, reviewed documented beta blocker date and time   Airway Mallampati: II TM Distance: >3 FB Neck ROM: full    Dental   Pulmonary neg pulmonary ROS,          Cardiovascular neg cardio ROS     Neuro/Psych Negative Neurological ROS  Negative Psych ROS   GI/Hepatic negative GI ROS, Neg liver ROS,   Endo/Other  Negative Endocrine ROS  Renal/GU negative Renal ROS  Genitourinary negative   Musculoskeletal   Abdominal   Peds  Hematology negative hematology ROS (+)   Anesthesia Other Findings See surgeon's H&P   Reproductive/Obstetrics negative OB ROS                           Anesthesia Physical Anesthesia Plan  ASA: II  Anesthesia Plan: General   Post-op Pain Management:    Induction: Intravenous  Airway Management Planned: Oral ETT  Additional Equipment:   Intra-op Plan:   Post-operative Plan: Extubation in OR  Informed Consent: I have reviewed the patients History and Physical, chart, labs and discussed the procedure including the risks, benefits and alternatives for the proposed anesthesia with the patient or authorized representative who has indicated his/her understanding and acceptance.     Plan Discussed with: CRNA and Surgeon  Anesthesia Plan Comments:         Anesthesia Quick Evaluation

## 2011-06-05 NOTE — Transfer of Care (Signed)
Immediate Anesthesia Transfer of Care Note  Patient: Charles Savage  Procedure(s) Performed:  FOREIGN BODY REMOVAL ADULT - removal foreign body from right side face   Patient Location: PACU  Anesthesia Type: General  Level of Consciousness: awake, alert , oriented and patient cooperative  Airway & Oxygen Therapy: Patient Spontanous Breathing and Patient connected to face mask oxygen  Post-op Assessment: Report given to PACU RN and Post -op Vital signs reviewed and stable  Post vital signs: Reviewed and stable  Complications: No apparent anesthesia complications

## 2011-06-05 NOTE — Anesthesia Procedure Notes (Signed)
Procedure Name: Intubation Date/Time: 06/05/2011 9:27 AM Performed by: Radford Pax Pre-anesthesia Checklist: Patient identified, Emergency Drugs available, Suction available, Patient being monitored and Timeout performed Patient Re-evaluated:Patient Re-evaluated prior to inductionOxygen Delivery Method: Circle System Utilized Preoxygenation: Pre-oxygenation with 100% oxygen Intubation Type: IV induction Ventilation: Mask ventilation without difficulty Laryngoscope Size: 3 and Miller Grade View: Grade III Tube type: Oral Tube size: 8.0 (cords barely visable and deep) mm Number of attempts: 1 Airway Equipment and Method: stylet Placement Confirmation: positive ETCO2 and breath sounds checked- equal and bilateral Tube secured with: Tape Dental Injury: Teeth and Oropharynx as per pre-operative assessment  Difficulty Due To: Difficulty was unanticipated and Difficult Airway- due to dentition

## 2011-06-05 NOTE — Brief Op Note (Signed)
06/05/2011  10:02 AM  PATIENT:  Charles Savage  55 y.o. male  PRE-OPERATIVE DIAGNOSIS:  Retained foreign body (metal)  POST-OPERATIVE DIAGNOSIS:  Retained foreign body (metal)  PROCEDURE:  Procedure(s): FOREIGN BODY REMOVAL ADULT  SURGEON:  Surgeon(s): Yaakov Guthrie Amenah Tucci  PHYSICIAN ASSISTANT:   ASSISTANTS: none   ANESTHESIA:   general  EBL:  Total I/O In: 200 [I.V.:200] Out: -   BLOOD ADMINISTERED:none  DRAINS: none   LOCAL MEDICATIONS USED:  NONE  SPECIMEN:  Excision  DISPOSITION OF SPECIMEN:  PATHOLOGY  COUNTS:  YES  TOURNIQUET:  * No tourniquets in log *  DICTATION: .409811  PLAN OF CARE: Discharge to home after PACU  PATIENT DISPOSITION:  PACU - hemodynamically stable.   Delay start of Pharmacological VTE agent (>24hrs) due to surgical blood loss or risk of bleeding:  {YES/NO/NOT APPLICABLE:20182

## 2011-06-05 NOTE — Anesthesia Postprocedure Evaluation (Signed)
  Anesthesia Post Note  Patient: Charles Savage  Procedure(s) Performed:  FOREIGN BODY REMOVAL ADULT - removal foreign body from right side face   Anesthesia type: General  Patient location: PACU  Post pain: Pain level controlled  Post assessment: [name] Cardiovascular Status Stable  Last Vitals:  Filed Vitals:   06/05/11 1214  BP: 104/66  Pulse: 86  Temp: 36.6 C  Resp: 20    Post vital signs: Reviewed and stable  Level of consciousness: alert  Complications: No apparent anesthesia complications

## 2011-06-06 NOTE — Op Note (Signed)
NAMECOLLIN, RENGEL               ACCOUNT NO.:  [id number]  MEDICAL RECORD NO.:  [id number]  LOCATION:                                 FACILITY:  PHYSICIAN:  Earvin Hansen L. Aubert Choyce, M.D.DATE OF BIRTH:  08-15-1955  DATE OF PROCEDURE:  06/05/2011 DATE OF DISCHARGE:                              OPERATIVE REPORT   This 55 year old gentleman who couple of years ago was working with a drill bit, fell off and broke and it lodged into his facial area.  He has a demonstrable area there on palpation but the patient needs to have an MRI and needs to have it now removed prior to that.  PROCEDURE:  Planned exploration right orbital area, removal of foreign body, drill bit with plastic closure, buffing of the bone.  ANESTHESIA:  General.  SURGEON:  Telina Kleckley L. Shon Hough, MD  ASSISTANTS:  None.  DESCRIPTION OF PROCEDURE:  Preoperatively, the patient had the area drawn.  He underwent general anesthesia and intubated orally.  Prep was done to the facial area with Betadine solution and walled off with sterile towels and drapes so as to make a sterile field.  Xylocaine 1.5% with epinephrine was injected locally over the site, a total of 9 mL. Next, we did a curvilinear incision over the site, carried down through the skin and subcutaneous tissue, orbicularis musculature to the mass and had configuration and capsule that were removed using blunt and sharp dissection.  The foreign body had been enlarged within the orbital bone that was buffed down with a buffer.  Then, after the air was removed, hemostasis was maintained with the Bovie anticoagulation, then plastic closure was done with multiple sutures of 3-0 Monocryl subcutaneously in the muscle, then subcuticular closure with 5-0 Monocryl.  Steri-Strips and soft dressing were applied to all the areas. He withstood the procedures very well and was taken to recovery in excellent condition.     Yaakov Guthrie. Shon Hough, M.D.     Cathie Hoops  D:   06/05/2011  T:  06/05/2011  Job:  [id number]

## 2011-06-08 ENCOUNTER — Encounter (HOSPITAL_BASED_OUTPATIENT_CLINIC_OR_DEPARTMENT_OTHER): Payer: Self-pay | Admitting: Specialist

## 2011-07-28 DIAGNOSIS — I639 Cerebral infarction, unspecified: Secondary | ICD-10-CM

## 2011-07-28 HISTORY — PX: CAROTID ENDARTERECTOMY: SUR193

## 2011-07-28 HISTORY — DX: Cerebral infarction, unspecified: I63.9

## 2011-07-30 ENCOUNTER — Encounter: Payer: Self-pay | Admitting: Cardiology

## 2011-08-25 DIAGNOSIS — I219 Acute myocardial infarction, unspecified: Secondary | ICD-10-CM

## 2011-08-25 HISTORY — DX: Acute myocardial infarction, unspecified: I21.9

## 2011-09-10 ENCOUNTER — Emergency Department (HOSPITAL_COMMUNITY): Payer: Medicaid Other

## 2011-09-10 ENCOUNTER — Encounter (HOSPITAL_COMMUNITY): Payer: Self-pay | Admitting: *Deleted

## 2011-09-10 ENCOUNTER — Other Ambulatory Visit: Payer: Self-pay

## 2011-09-10 ENCOUNTER — Inpatient Hospital Stay (HOSPITAL_COMMUNITY): Payer: Medicaid Other

## 2011-09-10 ENCOUNTER — Inpatient Hospital Stay (HOSPITAL_COMMUNITY)
Admission: EM | Admit: 2011-09-10 | Discharge: 2011-09-20 | DRG: 280 | Disposition: A | Discharge status: Home / Self Care | Payer: Medicaid Other | Source: Ambulatory Visit | Attending: Internal Medicine | Admitting: Internal Medicine

## 2011-09-10 DIAGNOSIS — I509 Heart failure, unspecified: Secondary | ICD-10-CM | POA: Diagnosis present

## 2011-09-10 DIAGNOSIS — M51379 Other intervertebral disc degeneration, lumbosacral region without mention of lumbar back pain or lower extremity pain: Secondary | ICD-10-CM | POA: Diagnosis present

## 2011-09-10 DIAGNOSIS — I059 Rheumatic mitral valve disease, unspecified: Secondary | ICD-10-CM | POA: Diagnosis present

## 2011-09-10 DIAGNOSIS — M5137 Other intervertebral disc degeneration, lumbosacral region: Secondary | ICD-10-CM | POA: Diagnosis present

## 2011-09-10 DIAGNOSIS — R9431 Abnormal electrocardiogram [ECG] [EKG]: Secondary | ICD-10-CM | POA: Diagnosis present

## 2011-09-10 DIAGNOSIS — M1990 Unspecified osteoarthritis, unspecified site: Secondary | ICD-10-CM | POA: Insufficient documentation

## 2011-09-10 DIAGNOSIS — Z888 Allergy status to other drugs, medicaments and biological substances status: Secondary | ICD-10-CM

## 2011-09-10 DIAGNOSIS — I5020 Unspecified systolic (congestive) heart failure: Secondary | ICD-10-CM | POA: Diagnosis present

## 2011-09-10 DIAGNOSIS — N058 Unspecified nephritic syndrome with other morphologic changes: Secondary | ICD-10-CM | POA: Diagnosis not present

## 2011-09-10 DIAGNOSIS — R5381 Other malaise: Secondary | ICD-10-CM | POA: Diagnosis present

## 2011-09-10 DIAGNOSIS — Z720 Tobacco use: Secondary | ICD-10-CM | POA: Diagnosis present

## 2011-09-10 DIAGNOSIS — D72829 Elevated white blood cell count, unspecified: Secondary | ICD-10-CM | POA: Diagnosis present

## 2011-09-10 DIAGNOSIS — M129 Arthropathy, unspecified: Secondary | ICD-10-CM | POA: Diagnosis present

## 2011-09-10 DIAGNOSIS — R0902 Hypoxemia: Secondary | ICD-10-CM | POA: Diagnosis not present

## 2011-09-10 DIAGNOSIS — I2510 Atherosclerotic heart disease of native coronary artery without angina pectoris: Secondary | ICD-10-CM | POA: Diagnosis present

## 2011-09-10 DIAGNOSIS — Z7902 Long term (current) use of antithrombotics/antiplatelets: Secondary | ICD-10-CM

## 2011-09-10 DIAGNOSIS — Z6830 Body mass index (BMI) 30.0-30.9, adult: Secondary | ICD-10-CM

## 2011-09-10 DIAGNOSIS — R5383 Other fatigue: Secondary | ICD-10-CM | POA: Diagnosis present

## 2011-09-10 DIAGNOSIS — J9601 Acute respiratory failure with hypoxia: Secondary | ICD-10-CM

## 2011-09-10 DIAGNOSIS — R531 Weakness: Secondary | ICD-10-CM | POA: Diagnosis present

## 2011-09-10 DIAGNOSIS — M519 Unspecified thoracic, thoracolumbar and lumbosacral intervertebral disc disorder: Secondary | ICD-10-CM | POA: Insufficient documentation

## 2011-09-10 DIAGNOSIS — Z9889 Other specified postprocedural states: Secondary | ICD-10-CM | POA: Insufficient documentation

## 2011-09-10 DIAGNOSIS — Z9981 Dependence on supplemental oxygen: Secondary | ICD-10-CM

## 2011-09-10 DIAGNOSIS — N179 Acute kidney failure, unspecified: Secondary | ICD-10-CM | POA: Diagnosis present

## 2011-09-10 DIAGNOSIS — I340 Nonrheumatic mitral (valve) insufficiency: Secondary | ICD-10-CM | POA: Diagnosis present

## 2011-09-10 DIAGNOSIS — D649 Anemia, unspecified: Secondary | ICD-10-CM | POA: Diagnosis present

## 2011-09-10 DIAGNOSIS — J189 Pneumonia, unspecified organism: Secondary | ICD-10-CM | POA: Diagnosis present

## 2011-09-10 DIAGNOSIS — N170 Acute kidney failure with tubular necrosis: Secondary | ICD-10-CM | POA: Diagnosis not present

## 2011-09-10 DIAGNOSIS — I5021 Acute systolic (congestive) heart failure: Secondary | ICD-10-CM | POA: Diagnosis present

## 2011-09-10 DIAGNOSIS — I959 Hypotension, unspecified: Secondary | ICD-10-CM | POA: Diagnosis not present

## 2011-09-10 DIAGNOSIS — J90 Pleural effusion, not elsewhere classified: Secondary | ICD-10-CM | POA: Diagnosis present

## 2011-09-10 DIAGNOSIS — I679 Cerebrovascular disease, unspecified: Secondary | ICD-10-CM | POA: Insufficient documentation

## 2011-09-10 DIAGNOSIS — E785 Hyperlipidemia, unspecified: Secondary | ICD-10-CM | POA: Diagnosis present

## 2011-09-10 DIAGNOSIS — J441 Chronic obstructive pulmonary disease with (acute) exacerbation: Secondary | ICD-10-CM | POA: Diagnosis present

## 2011-09-10 DIAGNOSIS — Z8673 Personal history of transient ischemic attack (TIA), and cerebral infarction without residual deficits: Secondary | ICD-10-CM

## 2011-09-10 DIAGNOSIS — E875 Hyperkalemia: Secondary | ICD-10-CM | POA: Diagnosis present

## 2011-09-10 DIAGNOSIS — E860 Dehydration: Secondary | ICD-10-CM | POA: Diagnosis present

## 2011-09-10 DIAGNOSIS — I214 Non-ST elevation (NSTEMI) myocardial infarction: Principal | ICD-10-CM | POA: Diagnosis present

## 2011-09-10 DIAGNOSIS — Z79899 Other long term (current) drug therapy: Secondary | ICD-10-CM

## 2011-09-10 DIAGNOSIS — G8929 Other chronic pain: Secondary | ICD-10-CM | POA: Diagnosis present

## 2011-09-10 DIAGNOSIS — Z7982 Long term (current) use of aspirin: Secondary | ICD-10-CM

## 2011-09-10 DIAGNOSIS — J9600 Acute respiratory failure, unspecified whether with hypoxia or hypercapnia: Secondary | ICD-10-CM | POA: Diagnosis not present

## 2011-09-10 HISTORY — DX: Cerebral infarction, unspecified: I63.9

## 2011-09-10 HISTORY — DX: Hyperlipidemia, unspecified: E78.5

## 2011-09-10 LAB — BLOOD GAS, ARTERIAL
Acid-base deficit: 1.1 mmol/L (ref 0.0–2.0)
Bicarbonate: 22.8 mEq/L (ref 20.0–24.0)
O2 Saturation: 91.7 %
TCO2: 19.1 mmol/L (ref 0–100)
pCO2 arterial: 36.3 mmHg (ref 35.0–45.0)
pO2, Arterial: 63.7 mmHg — ABNORMAL LOW (ref 80.0–100.0)

## 2011-09-10 LAB — CBC
Hemoglobin: 13.7 g/dL (ref 13.0–17.0)
MCH: 31.5 pg (ref 26.0–34.0)
MCHC: 33.8 g/dL (ref 30.0–36.0)
Platelets: 345 10*3/uL (ref 150–400)
RBC: 4.26 MIL/uL (ref 4.22–5.81)
RDW: 13.4 % (ref 11.5–15.5)
WBC: 11.8 10*3/uL — ABNORMAL HIGH (ref 4.0–10.5)
WBC: 11 10*3/uL — ABNORMAL HIGH (ref 4.0–10.5)

## 2011-09-10 LAB — CARDIAC PANEL(CRET KIN+CKTOT+MB+TROPI)
CK, MB: 4.8 ng/mL — ABNORMAL HIGH (ref 0.3–4.0)
Relative Index: 4.2 — ABNORMAL HIGH (ref 0.0–2.5)
Relative Index: 2.7 — ABNORMAL HIGH (ref 0.0–2.5)
Total CK: 114 U/L (ref 7–232)
Troponin I: 1.33 ng/mL (ref <0.30)

## 2011-09-10 LAB — STREP PNEUMONIAE URINARY ANTIGEN: Strep Pneumo Urinary Antigen: NEGATIVE

## 2011-09-10 LAB — TSH: TSH: 2.108 u[IU]/mL (ref 0.350–4.500)

## 2011-09-10 LAB — DIFFERENTIAL
Basophils Absolute: 0.1 10*3/uL (ref 0.0–0.1)
Basophils Relative: 0 % (ref 0–1)
Lymphocytes Relative: 27 % (ref 12–46)
Neutro Abs: 7.2 10*3/uL (ref 1.7–7.7)
Neutrophils Relative %: 61 % (ref 43–77)

## 2011-09-10 LAB — BASIC METABOLIC PANEL
Chloride: 100 mEq/L (ref 96–112)
GFR calc Af Amer: >90 mL/min (ref >90)
Potassium: 4 mEq/L (ref 3.5–5.1)
Sodium: 136 mEq/L (ref 135–145)

## 2011-09-10 LAB — TROPONIN I: Troponin I: 2.14 ng/mL (ref <0.30)

## 2011-09-10 LAB — CREATININE, SERUM: Creatinine, Ser: 0.9 mg/dL (ref 0.50–1.35)

## 2011-09-10 LAB — D-DIMER, QUANTITATIVE: D-Dimer, Quant: 1.5 ug/mL-FEU — ABNORMAL HIGH (ref 0.00–0.48)

## 2011-09-10 MED ORDER — GUAIFENESIN-DM 100-10 MG/5ML PO SYRP: 5.0000 mL | ORAL_SOLUTION | ORAL | Status: DC | PRN

## 2011-09-10 MED ORDER — LEVALBUTEROL HCL 0.63 MG/3ML IN NEBU
0.6300 mg | INHALATION_SOLUTION | Freq: Four times a day (QID) | RESPIRATORY_TRACT | Status: DC
  Administered 2011-09-10 – 2011-09-12 (×9): 0.63 mg via RESPIRATORY_TRACT
  Filled 2011-09-10 (×12): qty 3

## 2011-09-10 MED ORDER — HEPARIN BOLUS VIA INFUSION
4000.0000 [IU] | Freq: Once | INTRAVENOUS | Status: DC
  Filled 2011-09-10: qty 4000

## 2011-09-10 MED ORDER — HYDROMORPHONE HCL PF 1 MG/ML IJ SOLN
1.0000 mg | Freq: Once | INTRAMUSCULAR | Status: AC
Start: 2011-09-10 — End: 2011-09-10
  Administered 2011-09-10: 1 mg via INTRAVENOUS
  Filled 2011-09-10: qty 1

## 2011-09-10 MED ORDER — ONDANSETRON HCL 4 MG PO TABS: 4.0000 mg | ORAL_TABLET | Freq: Four times a day (QID) | ORAL | Status: DC | PRN

## 2011-09-10 MED ORDER — VANCOMYCIN HCL IN DEXTROSE 1-5 GM/200ML-% IV SOLN
1000.0000 mg | Freq: Three times a day (TID) | INTRAVENOUS | Status: DC
  Administered 2011-09-10 – 2011-09-12 (×4): 1000 mg via INTRAVENOUS
  Filled 2011-09-10 (×8): qty 200

## 2011-09-10 MED ORDER — ALBUTEROL SULFATE (5 MG/ML) 0.5% IN NEBU
2.5000 mg | INHALATION_SOLUTION | RESPIRATORY_TRACT | Status: DC | PRN
  Administered 2011-09-10: 2.5 mg via RESPIRATORY_TRACT
  Filled 2011-09-10: qty 0.5

## 2011-09-10 MED ORDER — ATORVASTATIN CALCIUM 40 MG PO TABS
40.0000 mg | ORAL_TABLET | Freq: Every day | ORAL | Status: DC
  Administered 2011-09-10 – 2011-09-12 (×3): 40 mg via ORAL
  Filled 2011-09-10 (×4): qty 1

## 2011-09-10 MED ORDER — LEVOFLOXACIN IN D5W 750 MG/150ML IV SOLN
750.0000 mg | INTRAVENOUS | Status: DC
  Administered 2011-09-10 – 2011-09-12 (×2): 750 mg via INTRAVENOUS
  Filled 2011-09-10 (×3): qty 150

## 2011-09-10 MED ORDER — HYDROCODONE-ACETAMINOPHEN 5-325 MG PO TABS
1.0000 | ORAL_TABLET | ORAL | Status: DC | PRN
  Administered 2011-09-10 – 2011-09-12 (×4): 2 via ORAL
  Administered 2011-09-13: 1 via ORAL
  Administered 2011-09-14 – 2011-09-19 (×7): 2 via ORAL
  Filled 2011-09-10 (×11): qty 2
  Filled 2011-09-10: qty 1

## 2011-09-10 MED ORDER — SIMVASTATIN 10 MG PO TABS
10.0000 mg | ORAL_TABLET | Freq: Every day | ORAL | Status: DC
  Filled 2011-09-10: qty 1

## 2011-09-10 MED ORDER — CEFTRIAXONE SODIUM 1 G IJ SOLR
1.0000 g | Freq: Once | INTRAMUSCULAR | Status: AC
  Administered 2011-09-10: 1 g via INTRAVENOUS
  Filled 2011-09-10: qty 10

## 2011-09-10 MED ORDER — METOPROLOL TARTRATE 12.5 MG HALF TABLET
12.5000 mg | ORAL_TABLET | Freq: Two times a day (BID) | ORAL | Status: DC
  Administered 2011-09-10 – 2011-09-11 (×2): 12.5 mg via ORAL
  Filled 2011-09-10 (×4): qty 1

## 2011-09-10 MED ORDER — ALBUTEROL SULFATE (5 MG/ML) 0.5% IN NEBU
2.5000 mg | INHALATION_SOLUTION | Freq: Once | RESPIRATORY_TRACT | Status: AC
  Administered 2011-09-10: 2.5 mg via RESPIRATORY_TRACT
  Filled 2011-09-10: qty 0.5

## 2011-09-10 MED ORDER — ALBUTEROL SULFATE (5 MG/ML) 0.5% IN NEBU: 2.5000 mg | INHALATION_SOLUTION | Freq: Four times a day (QID) | RESPIRATORY_TRACT | Status: DC

## 2011-09-10 MED ORDER — SODIUM CHLORIDE 0.9 % IJ SOLN
3.0000 mL | Freq: Two times a day (BID) | INTRAMUSCULAR | Status: DC
  Administered 2011-09-11 – 2011-09-20 (×13): 3 mL via INTRAVENOUS

## 2011-09-10 MED ORDER — MUPIROCIN 2 % EX OINT
1.0000 "application " | TOPICAL_OINTMENT | Freq: Two times a day (BID) | CUTANEOUS | Status: DC
  Filled 2011-09-10: qty 22

## 2011-09-10 MED ORDER — CHLORHEXIDINE GLUCONATE CLOTH 2 % EX PADS: 6.0000 | MEDICATED_PAD | Freq: Every day | CUTANEOUS | Status: DC

## 2011-09-10 MED ORDER — PANTOPRAZOLE SODIUM 40 MG IV SOLR
40.0000 mg | Freq: Every day | INTRAVENOUS | Status: DC
  Filled 2011-09-10 (×2): qty 40

## 2011-09-10 MED ORDER — NICOTINE 21 MG/24HR TD PT24
21.0000 mg | MEDICATED_PATCH | Freq: Every day | TRANSDERMAL | Status: DC
  Administered 2011-09-10 – 2011-09-20 (×11): 21 mg via TRANSDERMAL
  Filled 2011-09-10 (×11): qty 1

## 2011-09-10 MED ORDER — ASPIRIN 325 MG PO TABS
325.0000 mg | ORAL_TABLET | Freq: Once | ORAL | Status: AC
  Administered 2011-09-10: 325 mg via ORAL
  Filled 2011-09-10: qty 1

## 2011-09-10 MED ORDER — HYDROCODONE-ACETAMINOPHEN 10-325 MG PO TABS: 1.0000 | ORAL_TABLET | Freq: Three times a day (TID) | ORAL | Status: DC | PRN

## 2011-09-10 MED ORDER — HYDROMORPHONE HCL PF 1 MG/ML IJ SOLN
1.0000 mg | INTRAMUSCULAR | Status: DC | PRN
  Administered 2011-09-11: 1 mg via INTRAVENOUS
  Filled 2011-09-10: qty 1

## 2011-09-10 MED ORDER — PIPERACILLIN-TAZOBACTAM 3.375 G IVPB
3.3750 g | Freq: Three times a day (TID) | INTRAVENOUS | Status: DC
  Administered 2011-09-10 – 2011-09-12 (×4): 3.375 g via INTRAVENOUS
  Filled 2011-09-10 (×8): qty 50

## 2011-09-10 MED ORDER — CYCLOBENZAPRINE HCL 10 MG PO TABS
10.0000 mg | ORAL_TABLET | Freq: Three times a day (TID) | ORAL | Status: DC | PRN
  Administered 2011-09-11 – 2011-09-19 (×10): 10 mg via ORAL
  Filled 2011-09-10 (×11): qty 1

## 2011-09-10 MED ORDER — ALUM & MAG HYDROXIDE-SIMETH 200-200-20 MG/5ML PO SUSP: 30.0000 mL | Freq: Four times a day (QID) | ORAL | Status: DC | PRN

## 2011-09-10 MED ORDER — HEPARIN (PORCINE) IN NACL 100-0.45 UNIT/ML-% IJ SOLN
1600.0000 [IU]/h | INTRAMUSCULAR | Status: DC
  Administered 2011-09-10: 1200 [IU]/h via INTRAVENOUS
  Administered 2011-09-11 (×2): 1400 [IU]/h via INTRAVENOUS
  Administered 2011-09-12 – 2011-09-14 (×4): 1600 [IU]/h via INTRAVENOUS
  Filled 2011-09-10 (×8): qty 250

## 2011-09-10 MED ORDER — SODIUM CHLORIDE 0.9 % IV SOLN
INTRAVENOUS | Status: DC
  Administered 2011-09-11: 12:00:00 via INTRAVENOUS
  Administered 2011-09-15: 10 mL/h via INTRAVENOUS

## 2011-09-10 MED ORDER — ACETAMINOPHEN 325 MG PO TABS: 650.0000 mg | ORAL_TABLET | Freq: Four times a day (QID) | ORAL | Status: DC | PRN

## 2011-09-10 MED ORDER — ENOXAPARIN SODIUM 40 MG/0.4ML ~~LOC~~ SOLN
40.0000 mg | SUBCUTANEOUS | Status: DC
  Filled 2011-09-10: qty 0.4

## 2011-09-10 MED ORDER — NITROGLYCERIN 2 % TD OINT
1.0000 [in_us] | TOPICAL_OINTMENT | Freq: Once | TRANSDERMAL | Status: AC
  Administered 2011-09-10: 1 [in_us] via TOPICAL
  Filled 2011-09-10 (×2): qty 1

## 2011-09-10 MED ORDER — ONDANSETRON HCL 4 MG/2ML IJ SOLN
4.0000 mg | Freq: Four times a day (QID) | INTRAMUSCULAR | Status: DC | PRN
  Administered 2011-09-15: 4 mg via INTRAVENOUS
  Filled 2011-09-10: qty 2

## 2011-09-10 MED ORDER — ACETAMINOPHEN 650 MG RE SUPP: 650.0000 mg | Freq: Four times a day (QID) | RECTAL | Status: DC | PRN

## 2011-09-10 MED ORDER — IPRATROPIUM BROMIDE 0.02 % IN SOLN
0.5000 mg | Freq: Once | RESPIRATORY_TRACT | Status: AC
  Administered 2011-09-10: 0.5 mg via RESPIRATORY_TRACT
  Filled 2011-09-10: qty 2.5

## 2011-09-10 MED ORDER — IPRATROPIUM BROMIDE 0.02 % IN SOLN
0.5000 mg | Freq: Four times a day (QID) | RESPIRATORY_TRACT | Status: DC
  Administered 2011-09-10 – 2011-09-12 (×10): 0.5 mg via RESPIRATORY_TRACT
  Filled 2011-09-10 (×11): qty 2.5

## 2011-09-10 MED ORDER — ASPIRIN EC 325 MG PO TBEC
325.0000 mg | DELAYED_RELEASE_TABLET | Freq: Every day | ORAL | Status: DC
  Administered 2011-09-11 – 2011-09-17 (×7): 325 mg via ORAL
  Filled 2011-09-10 (×8): qty 1

## 2011-09-10 MED ORDER — DEXTROSE 5 % IV SOLN
500.0000 mg | Freq: Once | INTRAVENOUS | Status: AC
  Administered 2011-09-10: 500 mg via INTRAVENOUS
  Filled 2011-09-10: qty 500

## 2011-09-10 NOTE — Progress Notes (Addendum)
ANTICOAGULATION CONSULT NOTE - Initial Consult  Pharmacy Consult for [facility] Indication: chest pain/ACS  Allergies  Allergen Reactions  . Morphine And Related Hives and Itching  . Other     Peaches cause hives and itching     Patient Measurements: Height: 5\' 8"  (172.7 cm) Weight: 200 lb (90.719 kg) IBW/kg (Calculated) : 68.4   Vital Signs: Temp: 98.3 F (36.8 C) (03/17 1153) Temp src: Oral (03/17 0546) BP: 103/77 mmHg (03/17 1610) Pulse Rate: 108  (03/17 1500)  Labs:  Basename 09/10/11 1447 09/10/11 0606  HGB 13.4 13.7  HCT 39.0 40.5  PLT 345 358  APTT -- --  LABPROT -- --  INR -- --  HEPARINUNFRC -- --  CREATININE 0.90 0.98  CKTOTAL 114 --  CKMB 4.8* --  TROPONINI 1.33* 2.14*   Estimated Creatinine Clearance: 100.2 ml/min (by C-G formula based on Cr of 0.9).  Medical History: Past Medical History  Diagnosis Date  . Chronic pain     back pain  . Stroke   . Hyperlipemia   . Lumbar disc disease   . Carotid artery disease     Medications:  Prescriptions prior to admission  Medication Sig Dispense Refill  . aspirin EC 325 MG tablet Take 650 mg by mouth daily.      . cyclobenzaprine (FLEXERIL) 10 MG tablet Take 10 mg by mouth 3 (three) times daily as needed.        Marland Kitchen HYDROcodone-acetaminophen (NORCO) 10-325 MG per tablet Take 1 tablet by mouth every 8 (eight) hours as needed. Pain      . pravastatin (PRAVACHOL) 20 MG tablet Take 20 mg by mouth daily.      . meloxicam (MOBIC) 15 MG tablet Take 15 mg by mouth as needed.          Assessment: 56 y/o male patient admitted with chest pain, positive cardiac enzymes requiring anticoagulation for NSTEMI. EKG with some ST depression.  Goal of Therapy:  Heparin level 0.3-0.7 units/ml   Plan:  Heparin 4000 unit IV bolus followed by 1000 units/hr, check 6 hour level with daily cbc and heparin level.  Verlene Mayer, PharmD, BCPS Pager [phone] 09/10/2011,4:46 PM   Patient with recent stroke, will d/c heparin  bolus and start gtt at 1200 units/hr. D/c'd sq lovenox.  Verlene Mayer, PharmD, BCPS Pager [phone]

## 2011-09-10 NOTE — ED Notes (Signed)
Lab called critical troponin of 2.17. Result repeated. edp aware

## 2011-09-10 NOTE — ED Notes (Signed)
Plans being made for pt to be admitted. Pt and family aware

## 2011-09-10 NOTE — H&P (Signed)
History and Physical       Hospital Admission Note Date: 09/10/2011  Patient name: Charles Savage Medical record number: [id number] Date of birth: 21-Mar-1956 Age: 56 y.o. Gender: male PCP: Colette Ribas, MD, MD  Attending physician: Cathren Harsh, MD   Chief Complaint:  Shortness of breath worsening for last 3 days  HPI: Patient is a 56 year old Caucasian male with history of recent CVA in February 2013, carotid endarterectomy last month after CVA (done at [facility], [place]), chronic back pain, hyperlipidemia presented initially to [facility] ED this morning with shortness of breath worsening over last 3 days. Patient was found to have a positive troponin of 2.14 and EKG changes suggestive of NSTEMI. Patient was transferred to [facility] for further management of his cardiac issues. History was obtained from the patient and his family members in the room. Per patient, he has been having productive cough with yellowish phlegm, subjective fevers and chills, pleuritic chest pain with worsening shortness of breath for last 3 days. Patient also felt wheezing, he has ongoing nicotine abuse.   Review of Systems:  Constitutional:  See history of present illness.  HEENT: Denies photophobia, eye pain, redness, hearing loss, ear pain, congestion, sore throat, rhinorrhea, sneezing, mouth sores, trouble swallowing, neck pain, neck stiffness and tinnitus.   Respiratory: See history of present illness    Cardiovascular: Denies palpitations and leg swelling.  has pleuritic chest pain with coughing and deep breathing Gastrointestinal: Denies nausea, vomiting, abdominal pain, diarrhea, constipation, blood in stool and abdominal distention.  Genitourinary: Denies dysuria, urgency, frequency, hematuria, flank pain and difficulty urinating.  Musculoskeletal: Denies myalgias, joint swelling, arthralgias and gait problem.  has  chronic back pain Skin: Denies pallor, rash and wound.  Neurological: Denies dizziness, seizures, syncope, weakness, light-headedness, numbness and headaches.  Hematological: Denies adenopathy. Easy bruising, personal or family bleeding history  Psychiatric/Behavioral: Denies suicidal ideation, mood changes, confusion, nervousness, sleep disturbance and agitation  Past Medical History: Past Medical History  Diagnosis Date  . Arthritis   . Chronic pain     back pain  . Stroke    Past Surgical History  Procedure Date  . Back surgery 4098,1191  . Knee arthroscopy 2002    rt  . Foreign body removal 06/05/2011    Procedure: FOREIGN BODY REMOVAL ADULT;  Surgeon: Rosalio Macadamia;  Location: [facility];  Service: Plastics;  Laterality: Right;  removal foreign body from right side face   . Carotid endarterectomy     Medications: Prior to Admission medications   Medication Sig Start Date End Date Taking? Authorizing Provider  aspirin EC 325 MG tablet Take 650 mg by mouth daily.   Yes Historical Provider, MD  cyclobenzaprine (FLEXERIL) 10 MG tablet Take 10 mg by mouth 3 (three) times daily as needed.     Yes Historical Provider, MD  HYDROcodone-acetaminophen (NORCO) 10-325 MG per tablet Take 1 tablet by mouth every 8 (eight) hours as needed. Pain   Yes Historical Provider, MD  pravastatin (PRAVACHOL) 20 MG tablet Take 20 mg by mouth daily.   Yes Historical Provider, MD  meloxicam (MOBIC) 15 MG tablet Take 15 mg by mouth as needed.      Historical Provider, MD    Allergies:   Allergies  Allergen Reactions  . Morphine And Related Hives and Itching  . Other     Peaches cause hives and itching     Social History:  He reports that he does not drink alcohol  or use illicit drugs. patient smokes 2 packs per day. Denies any drug use, lives at home with his family and is functional with all his ADLs  Family History: No family history on file.  Physical Exam: Blood  pressure 101/75, pulse 102, temperature 98.3 F (36.8 C), temperature source Oral, resp. rate 24, height 5\' 8"  (1.727 m), weight 90.719 kg (200 lb), SpO2 95.00%. General: Alert, awake, oriented x3, in no acute distress. HEENT: anicteric sclera, pink conjunctiva, pupils equal and reactive to light and accomodation,  Neck: supple, no masses or lymphadenopathy, no goiter,  left carotid endarterectomy scar Heart: Tachycardiac Regular rate and rhythm, without murmurs, rubs or gallops. Lungs: Scattered wheezing with rhonchi right worse than left Abdomen: Soft, nontender, nondistended, positive bowel sounds, no masses. Extremities: No clubbing, cyanosis or edema with positive pedal pulses. Neuro: Grossly intact, no focal neurological deficits, strength 5/5 upper and lower extremities bilaterally Psych: alert and oriented x 3, normal mood and affect Skin: no rashes or lesions, warm and dry   LABS on Admission:  Basic Metabolic Panel:  Lab 09/10/11 9562  NA 136  K 4.0  CL 100  CO2 26  GLUCOSE 126*  BUN 15  CREATININE 0.98  CALCIUM 9.1  MG --  PHOS --     Lab 09/10/11 0606  WBC 11.8*  NEUTROABS 7.2  HGB 13.7  HCT 40.5  MCV 93.1  PLT 358   Cardiac Enzymes:  Lab 09/10/11 0606  CKTOTAL --  CKMB --  CKMBINDEX --  TROPONINI 2.14*     Radiological Exams on Admission: Dg Chest Port 1 View  09/10/2011  *RADIOLOGY REPORT*  Clinical Data: Short of breath  PORTABLE CHEST - 1 VIEW  Comparison: Chest CT 02/16/2011  Findings: Cardiac silhouette is enlarged.  There is air space disease in the right upper lobe.  There is bibasilar atelectasis. No pneumothorax. No acute osseous abnormality.  IMPRESSION: Right upper lobe pneumonia.  Recommend follow-up chest x-rays after therapy to ensure resolution.  Original Report Authenticated By: Genevive Bi, M.D.    Assessment/Plan Present on Admission:  .Pneumonia/ COPD exacerbation: Patient did receive antibiotics last month for his carotid  endarterectomy procedure, hence will manage as HCAP for now - Obtain blood cultures, sputum cultures, urine Legionella antigen, flu PCR, urine strep antigen, stat d-dimer - Placed on vancomycin, Zosyn, Levaquin, narrow antibiotics within next 48 hours depending on cultures and clinical stability - Placed on scheduled Xopenex and Atrovent nebulizers    .NSTEMI (non-ST elevated myocardial infarction): First set of troponins 2.14 with EKG changes, pleuritic chest pain - Obtain serial cardiac enzymes, stat d-dimer (if positive rule out PE), 2-D echo - Placed on aspirin, beta blocker (given tachycardia), statins - Placed cardiology consult, discussed with Dr. Donnie Aho, and defer heparin drip to cardiology  .CVA (cerebral infarction): Per patient was secondary to carotid artery occlusion status post carotid endarterectomy by Dr. Andrey Campanile in [facility] [place] - Continue full dose aspirin, statins   .Hyperlipidemia - Obtain lipid panel, continue Zocor   .Arthritis/ Chronic back pain: - Continue pain control when necessary   .Weakness generalized: - PTOT evaluation once more stable  .Nicotine abuse:  - Strongly counseled on nicotine cessation, place on Nicoderm patch   DVT prophylaxis: Lovenox  CODE STATUS: I discussed in detail with the patient, he opted to be FULL CODE STATUS  Family communication: Discussed in detail with patient's wife and sister in the room. [name] wife, Deval Mroczka phone #[phone], patient's sister, Delray Alt, phone #[phone]  Further  plan will depend as patient's clinical course evolves and further radiologic and laboratory data become available.   @Time  Spent on Admission: 1 hour Issabelle Mcraney M.D. [facility] 09/10/2011, 2:39 PM

## 2011-09-10 NOTE — ED Notes (Signed)
Pt complain of back pain ( has chronic back pain). edp aware

## 2011-09-10 NOTE — ED Notes (Signed)
Pt reports productive cough x 3 days, reports tonight he was unable to sleep or find a position of comfort, pt reports he was unable to "catch" his breath and it worried him

## 2011-09-10 NOTE — ED Notes (Signed)
Pt transferred via EMS to [facility].

## 2011-09-10 NOTE — ED Notes (Signed)
edp back in with pt to discuss plan of care

## 2011-09-10 NOTE — ED Notes (Signed)
Family with paitent. Doctor at bedside.

## 2011-09-10 NOTE — Progress Notes (Signed)
  Echocardiogram 2D Echocardiogram has been performed.  Charles Savage 09/10/2011, 6:36 PM

## 2011-09-10 NOTE — ED Notes (Signed)
Awaiting [facility] arrival for transport. Pt denies pain. Informed of room assignment at [facility].

## 2011-09-10 NOTE — ED Notes (Signed)
Report called to Nettie Elm, Charity fundraiser at [facility].

## 2011-09-10 NOTE — Consult Note (Signed)
Admit date: 09/10/2011 Name: Charles Savage 56 y.o.  male DOB:  03-04-1956 MRN:  [id number]  Today's date:  09/10/2011  Referring Physician:    Dr. Marigene Ehlers  Primary Physician:    Dr. Assunta Found  Reason for Consultation:   Chest pain, abnormal cardiac enzymes  IMPRESSIONS: 1. Clinical presentation of pneumonia with right upper lobe pneumonia on x-ray 2. Abnormal troponins thought secondary to a non-STEMI 3. Carotid artery disease with previous carotid endarterectomy 4. Ongoing tobacco abuse 5. Chronic lumbar disc disease with chronic pain 6. Clinical history of exertional tightness suggestive of angina 7. Elevated BNP level possible CHF  RECOMMENDATION: 1. Initiate treatment for pneumonia 2. Serial cardiac enzymes 3. Intravenous heparin as well as beta blocker therapy, high-dose statin therapy 4. Obtain echocardiogram 5. Serial EKGs. 6. Cardiac catheterization depending on clinical response and pneumonia  HISTORY: This 56 year old male presented to the [facility] emergency room with a 3 day history of pleuritic chest discomfort fever and chills and cough productive of yellowish-green sputum. He went because he thought he was having pneumonia and was also having shortness of breath. He was found to have a right upper lobe pneumonia but also troponins were done which were elevated. In retrospect he has had exertional chest tightness that has been present over the winter usually with breathing cold air in or with some level of exertion. He had a carotid endarterectomy last month at [facility]. He is also due to have upcoming lumbar disc surgery. He has some mild chronic dyspnea and smokes about 2 packs of cigarettes per day. He denies PND, orthopnea or edema. He does not have any claudication.   Past Medical History  Diagnosis Date  . Chronic pain     back pain  . Stroke   . Hyperlipemia   . Lumbar disc disease   . Carotid artery disease       Past Surgical History    Procedure Date  . Back surgery [id number]  . Knee arthroscopy 2002    rt  . Foreign body removal 06/05/2011    Procedure: FOREIGN BODY REMOVAL ADULT;  Surgeon: Rosalio Macadamia;  Location: [facility];  Service: Plastics;  Laterality: Right;  removal foreign body from right side face   . Carotid endarterectomy     Allergies:  is allergic to morphine and related and other.   Medications: Prior to Admission medications   Medication Sig Start Date End Date Taking? Authorizing Provider  aspirin EC 325 MG tablet Take 650 mg by mouth daily.   Yes Historical Provider, MD  cyclobenzaprine (FLEXERIL) 10 MG tablet Take 10 mg by mouth 3 (three) times daily as needed.     Yes Historical Provider, MD  HYDROcodone-acetaminophen (NORCO) 10-325 MG per tablet Take 1 tablet by mouth every 8 (eight) hours as needed. Pain   Yes Historical Provider, MD  pravastatin (PRAVACHOL) 20 MG tablet Take 20 mg by mouth daily.   Yes Historical Provider, MD  meloxicam (MOBIC) 15 MG tablet Take 15 mg by mouth as needed.      Historical Provider, MD    Family History:  Family Status  Relation Status Death Age  . Mother Deceased 69    heart trouble  . Father Deceased 24    heart trouble  . Brother Alive     arryhthmia  . Sister Alive     heart trouble  . Sister Alive     heart trouble    Social History:  reports that he has been smoking Cigarettes.  He has a 60 pack-year smoking history. He does not have any smokeless tobacco history on file. He reports that he does not drink alcohol or use illicit drugs.   History   Social History Narrative   Disabled.  Formerly did logging work.  Lives with wife.    Review of Systems: .General ROS: Mild fatigue over the winter, he is sedentary  Ophthalmic ROS: He had double vision last winter that has resolved, no other eye complaints Respiratory ROS: Significant for reticulocyte chest pain as well as cough productive of sputum Gastrointestinal ROS:  no abdominal pain, change in bowel habits, or black or bloody stools Genito-Urinary ROS: no dysuria, trouble voiding, or hematuria Musculoskeletal ROS: Significant chronic back pain for which he sees Dr. Joylene Igo. He also has arthritis of his hands Neurological ROS: He had right-sided weakness and difficulty with his speech which resolved after the recent carotid endarterectomy Dermatological ROS: negative Other than as noted above the remainder of the review of systems is unremarkable.  Physical Exam: Blood pressure 101/75, pulse 102, temperature 98.3 F (36.8 C), temperature source Oral, resp. rate 24, height 5\' 8"  (1.727 m), weight 90.719 kg (200 lb), SpO2 95.00%.    General appearance: alert, appears older than stated age and no distress Head: Normocephalic, without obvious abnormality, atraumatic Eyes: conjunctivae/corneas clear. PERRL, EOM's intact. Fundi benign. Ears: normal TM's and external ear canals both ears Neck: no adenopathy, no carotid bruit, no JVD, supple, symmetrical, trachea midline and Healed left carotid endarterectomy scar Back: Previous lumbar laminectomy scar Lungs: Mild rales and decreased breath sounds in the right upper lobe, mild rhonchi Chest wall: no tenderness Heart: regular rate and rhythm, S1, S2 normal, no murmur, click, rub or gallop Abdomen: soft, non-tender; bowel sounds normal; no masses,  no organomegaly Rectal: deferred Extremities: extremities normal, atraumatic, no cyanosis or edema Pulses: 2+ and symmetric Skin: Skin color, texture, turgor normal. No rashes or lesions Lymph nodes: No adenopathy Neurologic: Alert and oriented X 3, normal strength and tone. Normal symmetric reflexes. Normal coordination and gait  Labs: Results for orders placed during the hospital encounter of 09/10/11 (from the past 24 hour(s))  CBC     Status: Abnormal   Collection Time   09/10/11  6:06 AM      Component Value Range   WBC 11.8 (*) 4.0 - 10.5 (K/uL)   RBC 4.35   4.22 - 5.81 (MIL/uL)   Hemoglobin 13.7  13.0 - 17.0 (g/dL)   HCT 95.6  21.3 - 08.6 (%)   MCV 93.1  78.0 - 100.0 (fL)   MCH 31.5  26.0 - 34.0 (pg)   MCHC 33.8  30.0 - 36.0 (g/dL)   RDW 57.8  46.9 - 62.9 (%)   Platelets 358  150 - 400 (K/uL)  DIFFERENTIAL     Status: Normal   Collection Time   09/10/11  6:06 AM      Component Value Range   Neutrophils Relative 61  43 - 77 (%)   Neutro Abs 7.2  1.7 - 7.7 (K/uL)   Lymphocytes Relative 27  12 - 46 (%)   Lymphs Abs 3.2  0.7 - 4.0 (K/uL)   Monocytes Relative 9  3 - 12 (%)   Monocytes Absolute 1.0  0.1 - 1.0 (K/uL)   Eosinophils Relative 3  0 - 5 (%)   Eosinophils Absolute 0.4  0.0 - 0.7 (K/uL)   Basophils Relative 0  0 - 1 (%)  Basophils Absolute 0.1  0.0 - 0.1 (K/uL)  BASIC METABOLIC PANEL     Status: Abnormal   Collection Time   09/10/11  6:06 AM      Component Value Range   Sodium 136  135 - 145 (mEq/L)   Potassium 4.0  3.5 - 5.1 (mEq/L)   Chloride 100  96 - 112 (mEq/L)   CO2 26  19 - 32 (mEq/L)   Glucose, Bld 126 (*) 70 - 99 (mg/dL)   BUN 15  6 - 23 (mg/dL)   Creatinine, Ser 1.61  0.50 - 1.35 (mg/dL)   Calcium 9.1  8.4 - 09.6 (mg/dL)   GFR calc non Af Amer >90  >90 (mL/min)   GFR calc Af Amer >90  >90 (mL/min)  TROPONIN I     Status: Abnormal   Collection Time   09/10/11  6:06 AM      Component Value Range   Troponin I 2.14 (*) <0.30 (ng/mL)  PRO B NATRIURETIC PEPTIDE     Status: Abnormal   Collection Time   09/10/11  6:06 AM      Component Value Range   Pro B Natriuretic peptide (BNP) 5890.0 (*) 0 - 125 (pg/mL)  BLOOD GAS, ARTERIAL     Status: Abnormal   Collection Time   09/10/11  8:00 AM      Component Value Range   O2 Content 2.0     Delivery systems NASAL CANNULA     pH, Arterial 7.415  7.350 - 7.450    pCO2 arterial 36.3  35.0 - 45.0 (mmHg)   pO2, Arterial 63.7 (*) 80.0 - 100.0 (mmHg)   Bicarbonate 22.8  20.0 - 24.0 (mEq/L)   TCO2 19.1  0 - 100 (mmol/L)   Acid-base deficit 1.1  0.0 - 2.0 (mmol/L)   O2  Saturation 91.7     Patient temperature 37.0     Collection site RIGHT RADIAL     Drawn by [id number]     Sample type ARTHROGRAPHIS SPECIES     Allens test (pass/fail) PASS  PASS       Radiology: Basilar atelectasis, right upper lobe infiltrate  EKG: Sinus rhythm with ST depression inferolateral leads, occasional PVCs  Signed:  W. Ashley Royalty MD [facility]   Cardiology Consultant  09/10/2011, 3:13 PM

## 2011-09-10 NOTE — ED Notes (Signed)
[facility]. Pt and family aware. Pt denies pain at present. States the only time he has pain is when he takes a deep breath

## 2011-09-10 NOTE — Progress Notes (Signed)
Called NP Lenny Pastel with the [facility] about pts increase work of breathing with movement and bilateral crackles in bases of lungs. Ordered PCXR

## 2011-09-10 NOTE — ED Notes (Signed)
Report given to Victorino Dike, Charity fundraiser for [employer]. Transport ETA 30 min.

## 2011-09-10 NOTE — Progress Notes (Signed)
ANTIBIOTIC CONSULT NOTE - INITIAL  Pharmacy Consult for Vancomycin/Zosyn Indication: Suspected HCAP  Allergies  Allergen Reactions  . Morphine And Related Hives and Itching  . Other     Peaches cause hives and itching     Patient Measurements: Height: 5\' 8"  (172.7 cm) Weight: 200 lb (90.719 kg) IBW/kg (Calculated) : 68.4   Vital Signs: Temp: 98.3 F (36.8 C) (03/17 1153) Temp src: Oral (03/17 0546) BP: 101/75 mmHg (03/17 1400) Pulse Rate: 102  (03/17 1400) Intake/Output from previous day:   Intake/Output from this shift:    Labs:  [facility] 09/10/11 0606  WBC 11.8*  HGB 13.7  PLT 358  LABCREA --  CREATININE 0.98   Estimated Creatinine Clearance: 92 ml/min (by C-G formula based on Cr of 0.98). No results found for this basename: VANCOTROUGH:2,VANCOPEAK:2,VANCORANDOM:2,GENTTROUGH:2,GENTPEAK:2,GENTRANDOM:2,TOBRATROUGH:2,TOBRAPEAK:2,TOBRARND:2,AMIKACINPEAK:2,AMIKACINTROU:2,AMIKACIN:2, in the last 72 hours   Microbiology: No results found for this or any previous visit (from the past 720 hour(s)).  Medical History: Past Medical History  Diagnosis Date  . Arthritis   . Chronic pain     back pain  . Stroke    Assessment: 38 yoM with PNA/COPD exacerbation.  Pt had recent hospital procedure therefore will be treated for HCAP.  Pt will be on Levaquin per MD and Vancomycin/Zosyn per Pharmacy.   Renal rxn ok (CrCl ~ 90ml /min).  WBC 11.8 (neutrophills 61%).  Afebrile.   Blood Cxs, Resp Cx, legionella/influenza/ strep labs pending.    Goal of Therapy:  Vancomycin trough level 15-20 mcg/ml  Plan:  1.   Vanc 1 gram IV q 8 hours 2.   Zosyn 3.375g q 8 hours (4 hour infusion).   3.   F/u cultures, T, WBC, clinical course.

## 2011-09-10 NOTE — ED Notes (Signed)
Pt reports he has had a productive cough for the past 3 days, reports tonight he was unable to walk but a few steps without having increased sob

## 2011-09-10 NOTE — ED Provider Notes (Signed)
History     CSN: [id number]  Arrival date & time 09/10/11  0535   First MD Initiated Contact with Patient 09/10/11 [phone]      Chief Complaint  Patient presents with  . Shortness of Breath    (Consider location/radiation/quality/duration/timing/severity/associated sxs/prior treatment) HPI Charles Savage is a 56 y.o. male who presents to the Emergency Department complaining of x3 days productive of yellow-green sputum. He is not aware of having fevers although he has had some sweating. He was unable to sleep last night due to shortness of breath while lying down. He has heard himself wheezing. He has taken no medicines. He denies nausea, vomiting, diarrhea, weakness.  PCP Dr. Phillips Odor Past Medical History  Diagnosis Date  . Arthritis   . Chronic pain     back pain  . Stroke     Past Surgical History  Procedure Date  . Back surgery [id number]  . Knee arthroscopy 2002    rt  . Foreign body removal 06/05/2011    Procedure: FOREIGN BODY REMOVAL ADULT;  Surgeon: Rosalio Macadamia;  Location: [facility];  Service: Plastics;  Laterality: Right;  removal foreign body from right side face   . Carotid endarterectomy     No family history on file.  History  Substance Use Topics  . Smoking status: Current Everyday Smoker -- 1.5 packs/day  . Smokeless tobacco: Not on file  . Alcohol Use: No      Review of Systems ROS: Statement: All systems negative except as marked or noted in the HPI; Constitutional: Negative for fever and chills. ; ; Eyes: Negative for eye pain, redness and discharge. ; ; ENMT: Negative for ear pain, hoarseness, nasal congestion, sinus pressure and sore throat. ; ; Cardiovascular: Negative for chest pain, palpitations, diaphoresis, dyspnea and peripheral edema. ; ; Respiratory: Negative for stridor. ; ; Gastrointestinal: Negative for nausea, vomiting, diarrhea, abdominal pain, blood in stool, hematemesis, jaundice and rectal bleeding. . ; ;  Genitourinary: Negative for dysuria, flank pain and hematuria. ; ; Musculoskeletal: Negative for back pain and neck pain. Negative for swelling and trauma.; ; Skin: Negative for pruritus, rash, abrasions, blisters, bruising and skin lesion.; ; Neuro: Negative for headache, lightheadedness and neck stiffness. Negative for weakness, altered level of consciousness , altered mental status, extremity weakness, paresthesias, involuntary movement, seizure and syncope.     Allergies  Morphine and related  Home Medications   Current Outpatient Rx  Name Route Sig Dispense Refill  . PRAVASTATIN SODIUM 20 MG PO TABS Oral Take 20 mg by mouth daily.    . CYCLOBENZAPRINE HCL 10 MG PO TABS Oral Take 10 mg by mouth 3 (three) times daily as needed.      Marland Kitchen HYDROCODONE-ACETAMINOPHEN 10-325 MG PO TABS Oral Take 1 tablet by mouth every 6 (six) hours as needed.      . MELOXICAM 15 MG PO TABS Oral Take 15 mg by mouth as needed.        BP 131/71  Pulse 115  Temp(Src) 98.3 F (36.8 C) (Oral)  Resp 20  Ht 5\' 8"  (1.727 m)  Wt 200 lb (90.719 kg)  BMI 30.41 kg/m2  SpO2 93%  Physical Exam Physical examination:  Nursing notes reviewed; Vital signs and O2 SAT reviewed;  Constitutional: Well developed, Well nourished, Well hydrated, In no acute distress; Head:  Normocephalic, atraumatic; Eyes: EOMI, PERRL, No scleral icterus; ENMT: Mouth and pharynx normal, Mucous membranes moist; Neck: Supple, Full range of motion, No lymphadenopathy; Cardiovascular:  Regular rate and rhythm, No murmur, rub, or gallop; Respiratory: Breath sounds equal, mild respiratory effort, end expiratory wheezing, Normal respiratoryexcursion; Chest: Nontender, Movement normal; Abdomen: Soft, Nontender, Nondistended, Normal bowel sounds; Genitourinary: No CVA tenderness; Extremities: Pulses normal, No tenderness, No edema, No calf edema or asymmetry.; Neuro: AA&Ox3, Major CN grossly intact.  No gross focal motor or sensory deficits in extremities.;  Skin: Color normal, Warm, Dry  ED Course  Procedures (including critical care time) Results for orders placed during the hospital encounter of 09/10/11  CBC      Component Value Range   WBC 11.8 (*) 4.0 - 10.5 (K/uL)   RBC 4.35  4.22 - 5.81 (MIL/uL)   Hemoglobin 13.7  13.0 - 17.0 (g/dL)   HCT 16.1  09.6 - 04.5 (%)   MCV 93.1  78.0 - 100.0 (fL)   MCH 31.5  26.0 - 34.0 (pg)   MCHC 33.8  30.0 - 36.0 (g/dL)   RDW 40.9  81.1 - 91.4 (%)   Platelets 358  150 - 400 (K/uL)  DIFFERENTIAL      Component Value Range   Neutrophils Relative 61  43 - 77 (%)   Neutro Abs 7.2  1.7 - 7.7 (K/uL)   Lymphocytes Relative 27  12 - 46 (%)   Lymphs Abs 3.2  0.7 - 4.0 (K/uL)   Monocytes Relative 9  3 - 12 (%)   Monocytes Absolute 1.0  0.1 - 1.0 (K/uL)   Eosinophils Relative 3  0 - 5 (%)   Eosinophils Absolute 0.4  0.0 - 0.7 (K/uL)   Basophils Relative 0  0 - 1 (%)   Basophils Absolute 0.1  0.0 - 0.1 (K/uL)  BASIC METABOLIC PANEL      Component Value Range   Sodium 136  135 - 145 (mEq/L)   Potassium 4.0  3.5 - 5.1 (mEq/L)   Chloride 100  96 - 112 (mEq/L)   CO2 26  19 - 32 (mEq/L)   Glucose, Bld 126 (*) 70 - 99 (mg/dL)   BUN 15  6 - 23 (mg/dL)   Creatinine, Ser 7.82  0.50 - 1.35 (mg/dL)   Calcium 9.1  8.4 - 95.6 (mg/dL)   GFR calc non Af Amer >90  >90 (mL/min)   GFR calc Af Amer >90  >90 (mL/min)  TROPONIN I      Component Value Range   Troponin I 2.14 (*) <0.30 (ng/mL)    Date: 09/10/2011 2130  Rate: 113  Rhythm: sinus tachycardia and premature ventricular contractions (PVC)  QRS Axis: normal  Intervals: normal  ST/T Wave abnormalities: ST depressions laterally  Conduction Disutrbances:none  Narrative Interpretation: Patient with frequent consecutive PVCs   Old EKG Reviewed: changes noted c/w 08/12/10 PVCs are new Obtained long rhythm strip that shows NSR alternating with sinus tachycardia with frequent PVCs.  Dg Chest Port 1 View  09/10/2011  *RADIOLOGY REPORT*  Clinical Data: Short of  breath  PORTABLE CHEST - 1 VIEW  Comparison: Chest CT 02/16/2011  Findings: Cardiac silhouette is enlarged.  There is air space disease in the right upper lobe.  There is bibasilar atelectasis. No pneumothorax. No acute osseous abnormality.  IMPRESSION: Right upper lobe pneumonia.  Recommend follow-up chest x-rays after therapy to ensure resolution.  Original Report Authenticated By: Genevive Bi, M.D.    7:38 AM:  T/C to Dr. Viann Fish, cardiology, case discussed, including:  HPI, pertinent PM/SHx, VS/PE, dx testing, ED course and treatment.  He recommended admission with hospitalist, cardiology consultation.  With patient recent stroke (07/2011 and carotid endarterectomy) , treatment of pna first then consideration of further cardiac interventions.  1610 Spoke with patient and his wife. His stroke was non hemorraghic, affecting his right side. He has regained all use of right side. Carotid endarterectomy was performed at [facility] by Dr. Andrey Campanile. 46 Spoke with [facility]. They have a page out already to  [facility] hospitalist based on the conversation previously with Dr. Donnie Aho.  7:58 AM:  T/C to Dr. Isidoro Donning, hospitalist, case discussed, including:  HPI, pertinent PM/SHx, VS/PE, dx testing, ED course and treatment.  Agreeable to transfer and admission to step down at [facility], Team 5.   MDM  Patient presents with a three-day history of cough productive of yellow-green sputum associated with shortness of breath.Chest xray with right upper lobe CAP. Initiated antibiotic therapy.  Labs unremarkable except for an elevated troponin. EKG with possible evidence of anterior MI and with ST depression in the lateral leads. . Suspect patient may have had a NSTEMI in the setting of this pna. He has received albuterol and atrovent with improvement in shortness of breath. Received with patient and his wife all of current findings and recommendation that he transfer to [facility] for care. Spoke with hospitalist at [facility] who accepted patient in  transfer. Pt stable in ED with no significant deterioration in condition. The patient appears reasonably stabilized for transfer considering the current resources, flow, and capabilities available in the ED at this time, and I doubt any other [facility] requiring further screening and/or treatment in the ED prior to transfer.  MDM Reviewed: nursing note and vitals Interpretation: labs, ECG and x-ray Consults: cardiology (hospitalist)  CRITICAL CARE Performed by: Annamarie Dawley.   Total critical care time: 40 Critical care time was exclusive of separately billable procedures and treating other patients.  Critical care was necessary to treat or prevent imminent or life-threatening deterioration.  Critical care was time spent personally by me on the following activities: development of treatment plan with patient and/or surrogate as well as nursing, discussions with consultants, evaluation of patient's response to treatment, examination of patient, obtaining history from patient or surrogate, ordering and performing treatments and interventions, ordering and review of laboratory studies, ordering and review of radiographic studies, pulse oximetry and re-evaluation of patient's condition.         Nicoletta Dress. Colon Branch, MD 09/10/11 [id number]

## 2011-09-11 DIAGNOSIS — D72829 Elevated white blood cell count, unspecified: Secondary | ICD-10-CM | POA: Diagnosis present

## 2011-09-11 DIAGNOSIS — E860 Dehydration: Secondary | ICD-10-CM | POA: Diagnosis present

## 2011-09-11 DIAGNOSIS — J9601 Acute respiratory failure with hypoxia: Secondary | ICD-10-CM | POA: Diagnosis present

## 2011-09-11 DIAGNOSIS — R9431 Abnormal electrocardiogram [ECG] [EKG]: Secondary | ICD-10-CM | POA: Diagnosis present

## 2011-09-11 DIAGNOSIS — I5020 Unspecified systolic (congestive) heart failure: Secondary | ICD-10-CM | POA: Diagnosis present

## 2011-09-11 DIAGNOSIS — I340 Nonrheumatic mitral (valve) insufficiency: Secondary | ICD-10-CM | POA: Diagnosis present

## 2011-09-11 DIAGNOSIS — Z720 Tobacco use: Secondary | ICD-10-CM | POA: Diagnosis present

## 2011-09-11 LAB — LIPID PANEL
Cholesterol: 149 mg/dL (ref 0–200)
Total CHOL/HDL Ratio: 7.5 RATIO
Triglycerides: 125 mg/dL (ref <150)
VLDL: 25 mg/dL (ref 0–40)

## 2011-09-11 LAB — BASIC METABOLIC PANEL
CO2: 24 mEq/L (ref 19–32)
Chloride: 99 mEq/L (ref 96–112)
Potassium: 3.8 mEq/L (ref 3.5–5.1)
Sodium: 133 mEq/L — ABNORMAL LOW (ref 135–145)

## 2011-09-11 LAB — EXPECTORATED SPUTUM ASSESSMENT W GRAM STAIN, RFLX TO RESP C

## 2011-09-11 LAB — CBC
HCT: 35.1 % — ABNORMAL LOW (ref 39.0–52.0)
Hemoglobin: 12.1 g/dL — ABNORMAL LOW (ref 13.0–17.0)
MCV: 90.9 fL (ref 78.0–100.0)
RBC: 3.86 MIL/uL — ABNORMAL LOW (ref 4.22–5.81)
WBC: 12.3 10*3/uL — ABNORMAL HIGH (ref 4.0–10.5)

## 2011-09-11 LAB — CARDIAC PANEL(CRET KIN+CKTOT+MB+TROPI)
CK, MB: 3.9 ng/mL (ref 0.3–4.0)
Relative Index: 3.8 — ABNORMAL HIGH (ref 0.0–2.5)
Troponin I: 0.95 ng/mL (ref <0.30)

## 2011-09-11 LAB — HEPARIN LEVEL (UNFRACTIONATED): Heparin Unfractionated: 0.16 IU/mL — ABNORMAL LOW (ref 0.30–0.70)

## 2011-09-11 LAB — LEGIONELLA ANTIGEN, URINE

## 2011-09-11 MED ORDER — PANTOPRAZOLE SODIUM 40 MG PO TBEC
40.0000 mg | DELAYED_RELEASE_TABLET | Freq: Every day | ORAL | Status: DC
  Administered 2011-09-11 – 2011-09-20 (×9): 40 mg via ORAL
  Filled 2011-09-11 (×9): qty 1

## 2011-09-11 MED ORDER — FUROSEMIDE 10 MG/ML IJ SOLN
40.0000 mg | Freq: Two times a day (BID) | INTRAMUSCULAR | Status: DC
  Filled 2011-09-11 (×2): qty 4

## 2011-09-11 MED ORDER — METOPROLOL TARTRATE 25 MG PO TABS
25.0000 mg | ORAL_TABLET | Freq: Two times a day (BID) | ORAL | Status: DC
  Administered 2011-09-11 – 2011-09-14 (×7): 25 mg via ORAL
  Filled 2011-09-11 (×8): qty 1

## 2011-09-11 MED ORDER — FUROSEMIDE 40 MG PO TABS
40.0000 mg | ORAL_TABLET | Freq: Two times a day (BID) | ORAL | Status: DC
  Administered 2011-09-11 – 2011-09-12 (×2): 40 mg via ORAL
  Filled 2011-09-11 (×3): qty 1

## 2011-09-11 MED ORDER — POTASSIUM CHLORIDE CRYS ER 20 MEQ PO TBCR
20.0000 meq | EXTENDED_RELEASE_TABLET | Freq: Two times a day (BID) | ORAL | Status: DC
  Administered 2011-09-11 – 2011-09-12 (×2): 20 meq via ORAL
  Filled 2011-09-11 (×3): qty 1

## 2011-09-11 MED ORDER — FUROSEMIDE 40 MG PO TABS
40.0000 mg | ORAL_TABLET | Freq: Two times a day (BID) | ORAL | Status: DC
  Administered 2011-09-11: 40 mg via ORAL
  Filled 2011-09-11: qty 1

## 2011-09-11 NOTE — Progress Notes (Signed)
Subjective:  Had some mild dyspnea last night, is better this am.  No chest tightness pleuritic chest pain is better. He reports that he had an ECHO at [facility].  Also reports has been told of murmur in past.  Objective:  Vital Signs in the last 24 hours: BP 96/79  Pulse 109  Temp(Src) 97.8 F (36.6 C) (Oral)  Resp 32  Ht 5\' 8"  (1.727 m)  Wt 92 kg (202 lb 13.2 oz)  BMI 30.84 kg/m2  SpO2 97%  Physical Exam: Pleasant obese WM in NAD Lungs:  Clear  Cardiac:  Regular rhythm, normal S1 and S2, no S3 I could not appreciate a murmur after careful exam Abdomen:  Soft, nontender, no masses Extremities:  No edema present  Intake/Output from previous day: 03/17 0701 - 03/18 0700 In: 1218 [I.V.:568; IV Piggyback:650] Out: 1300 [Urine:1300] Weight change: 1.281 kg (2 lb 13.2 oz)  Lab Results: Basic Metabolic Panel:  Basename 09/11/11 0313 09/10/11 1447 09/10/11 0606  NA 133* -- 136  K 3.8 -- 4.0  CL 99 -- 100  CO2 24 -- 26  GLUCOSE 111* -- 126*  BUN 11 -- 15  CREATININE 0.89 0.90 --   CBC:  Basename 09/11/11 0313 09/10/11 1447 09/10/11 0606  WBC 12.3* 11.0* --  NEUTROABS -- -- 7.2  HGB 12.1* 13.4 --  HCT 35.1* 39.0 --  MCV 90.9 91.5 --  PLT 321 345 --   Cardiac Enzymes:  Basename 09/11/11 0214 09/10/11 2030 09/10/11 1447  CKTOTAL 103 153 114  CKMB 3.9 4.2* 4.8*  CKMBINDEX -- -- --  TROPONINI 0.95* 1.03* 1.33*    Protime: . Lab Results  Component Value Date   INR 1.02 06/05/2010    Telemetry: Sinus tachycardia  Assessment/Plan:  1. Non STEMI with positive troponin and MB  2. Abnormal ECHO with severe MR ( again did not appreciate murmur at bedside) 3. Mild CHF with EF 40-45% 4. Abnormal EKG c/w ischemia 5. Sinus tachycardia 6. Pneumonia under treatment  Rec:  1.  [facility] records he had an ECHO there 2. Continue antibiotics for pneumonia 3. Mild diuresis 4. Continue heparin and treat pneumonia  May consider cath in a day or two once  pneumonia well under treatment.  The LA enlargement suggests a cardia process that has been going on sometime.  Hard to tell is MR chronic or acute.       Darden Palmer.  MD [facility] 09/11/2011, 9:45 AM

## 2011-09-11 NOTE — Progress Notes (Signed)
Pt signed release of information for [facility].  Forms pass to NSMT to be faxed off for records.  Eliane Decree, RN, 09/11/2011, 1215pm

## 2011-09-11 NOTE — Progress Notes (Signed)
ANTICOAGULATION CONSULT NOTE - Follow Up Consult  Pharmacy Consult for Heparin Indication: NSTEMI  Allergies  Allergen Reactions  . Morphine And Related Hives and Itching  . Other     Peaches cause hives and itching     Patient Measurements: Height: 5\' 8"  (172.7 cm) Weight: 202 lb 13.2 oz (92 kg) IBW/kg (Calculated) : 68.4  Heparin Dosing Weight: 87 kg  Vital Signs: Temp: 98.2 F (36.8 C) (03/18 1619) Temp src: Oral (03/18 1619) BP: 122/73 mmHg (03/18 1619) Pulse Rate: 114  (03/18 1619)  Labs:  Alvira Philips 09/11/11 1805 09/11/11 0810 09/11/11 0313 09/11/11 0214 09/10/11 2030 09/10/11 1447 09/10/11 0606  HGB -- -- 12.1* -- -- 13.4 --  HCT -- -- 35.1* -- -- 39.0 40.5  PLT -- -- 321 -- -- 345 358  APTT -- -- -- -- -- -- --  LABPROT -- -- -- -- -- -- --  INR -- -- -- -- -- -- --  HEPARINUNFRC 0.33 0.17* 0.18* -- -- -- --  CREATININE -- -- 0.89 -- -- 0.90 0.98  CKTOTAL -- -- -- 103 153 114 --  CKMB -- -- -- 3.9 4.2* 4.8* --  TROPONINI -- -- -- 0.95* 1.03* 1.33* --   Estimated Creatinine Clearance: 102 ml/min (by C-G formula based on Cr of 0.89).   Medications:  Scheduled:     . aspirin EC  325 mg Oral Daily  . atorvastatin  40 mg Oral q1800  . furosemide  40 mg Oral BID  . ipratropium  0.5 mg Nebulization Q6H  . levalbuterol  0.63 mg Nebulization Q6H  . levofloxacin (LEVAQUIN) IV  750 mg Intravenous Q24H  . metoprolol tartrate  25 mg Oral BID  . nicotine  21 mg Transdermal Daily  . pantoprazole  40 mg Oral Q1200  . piperacillin-tazobactam (ZOSYN)  IV  3.375 g Intravenous Q8H  . potassium chloride  20 mEq Oral BID  . sodium chloride  3 mL Intravenous Q12H  . vancomycin  1,000 mg Intravenous Q8H  . DISCONTD: Chlorhexidine Gluconate Cloth  6 each Topical Q0600  . DISCONTD: Chlorhexidine Gluconate Cloth  6 each Topical Q0600  . DISCONTD: furosemide  40 mg Intravenous Q12H  . DISCONTD: furosemide  40 mg Oral BID  . DISCONTD: metoprolol tartrate  12.5 mg Oral BID  .  DISCONTD: mupirocin ointment  1 application Nasal BID  . DISCONTD: mupirocin ointment  1 application Nasal BID  . DISCONTD: pantoprazole (PROTONIX) IV  40 mg Intravenous Q1200   Infusions:     . sodium chloride 10 mL/hr at 09/11/11 1445  . heparin 1,600 Units/hr (09/11/11 1100)    Assessment: 56 yo M on heparin for NSTEMI.  Heparin level now therapeutic.  Noted plans for cardiac cath in 1-2 days.  Goal of Therapy:  Heparin level 0.3-0.7 units/ml   Plan:  Continue heparin at 1600 units/hr. F/U heparin level in AM  Rolland Porter, [place].D., BCPS Clinical Pharmacist Pager: [phone]

## 2011-09-11 NOTE — Progress Notes (Signed)
ANTICOAGULATION CONSULT NOTE - Follow Up Consult  Pharmacy Consult for heparin Indication: chest pain/ACS  Labs:  Basename 09/10/11 2308 09/10/11 2030 09/10/11 1447 09/10/11 0606  HGB -- -- 13.4 13.7  HCT -- -- 39.0 40.5  PLT -- -- 345 358  APTT -- -- -- --  LABPROT -- -- -- --  INR -- -- -- --  HEPARINUNFRC 0.16* -- -- --  CREATININE -- -- 0.90 0.98  CKTOTAL -- 153 114 --  CKMB -- 4.2* 4.8* --  TROPONINI -- 1.03* 1.33* 2.14*   Assessment: 55yo male subtherapeutic on heparin with initial dosing for CP (CVA in 07/2011).  Goal of Therapy:  Heparin level 0.3-0.7 units/ml   Plan:  Will increase heparin gtt by 2-3 units/kg/hr to 1400 units/hr and check level in 6hr.  Colleen Can PharmD BCPS 09/11/2011,12:15 AM

## 2011-09-11 NOTE — Progress Notes (Signed)
Utilization review completed.  

## 2011-09-11 NOTE — Progress Notes (Addendum)
ANTICOAGULATION CONSULT NOTE - Follow Up Consult  Pharmacy Consult for Heparin Indication: chest pain/ACS  Allergies  Allergen Reactions  . Morphine And Related Hives and Itching  . Other     Peaches cause hives and itching     Patient Measurements: Height: 5\' 8"  (172.7 cm) Weight: 202 lb 13.2 oz (92 kg) IBW/kg (Calculated) : 68.4  Heparin Dosing Weight: 87 kg  Vital Signs: Temp: 97.8 F (36.6 C) (03/18 0738) Temp src: Oral (03/18 0738) BP: 96/79 mmHg (03/18 0853) Pulse Rate: 109  (03/18 0853)  Labs:  Alvira Philips 09/11/11 0810 09/11/11 0313 09/11/11 0214 09/10/11 2308 09/10/11 2030 09/10/11 1447 09/10/11 0606  HGB -- 12.1* -- -- -- 13.4 --  HCT -- 35.1* -- -- -- 39.0 40.5  PLT -- 321 -- -- -- 345 358  APTT -- -- -- -- -- -- --  LABPROT -- -- -- -- -- -- --  INR -- -- -- -- -- -- --  HEPARINUNFRC 0.17* 0.18* -- 0.16* -- -- --  CREATININE -- 0.89 -- -- -- 0.90 0.98  CKTOTAL -- -- 103 -- 153 114 --  CKMB -- -- 3.9 -- 4.2* 4.8* --  TROPONINI -- -- 0.95* -- 1.03* 1.33* --   Estimated Creatinine Clearance: 102 ml/min (by C-G formula based on Cr of 0.89).   Medications:  Scheduled:    . aspirin EC  325 mg Oral Daily  . atorvastatin  40 mg Oral q1800  . azithromycin  500 mg Intravenous Once  . furosemide  40 mg Oral BID  . ipratropium  0.5 mg Nebulization Q6H  . levalbuterol  0.63 mg Nebulization Q6H  . levofloxacin (LEVAQUIN) IV  750 mg Intravenous Q24H  . metoprolol tartrate  25 mg Oral BID  . nicotine  21 mg Transdermal Daily  . pantoprazole (PROTONIX) IV  40 mg Intravenous Q1200  . piperacillin-tazobactam (ZOSYN)  IV  3.375 g Intravenous Q8H  . sodium chloride  3 mL Intravenous Q12H  . vancomycin  1,000 mg Intravenous Q8H  . DISCONTD: albuterol  2.5 mg Nebulization Q6H  . DISCONTD: Chlorhexidine Gluconate Cloth  6 each Topical Q0600  . DISCONTD: Chlorhexidine Gluconate Cloth  6 each Topical Q0600  . DISCONTD: enoxaparin  40 mg Subcutaneous Q24H  . DISCONTD:  heparin  4,000 Units Intravenous Once  . DISCONTD: metoprolol tartrate  12.5 mg Oral BID  . DISCONTD: mupirocin ointment  1 application Nasal BID  . DISCONTD: mupirocin ointment  1 application Nasal BID  . DISCONTD: simvastatin  10 mg Oral q1800   Infusions:    . sodium chloride 75 mL/hr at 09/11/11 0700  . heparin 1,400 Units/hr (09/11/11 1018)    Assessment: 56 yo M on heparin for CP/ACS.  Heparin level remains subtherapeutic despite rate increase overnight.  No IV issues noted per RN.  Noted plans for cardiac cath in 1-2 days.  Goal of Therapy:  Heparin level 0.3-0.7 units/ml   Plan:  Increase heparin to 1600 units/hr. Recheck heparin level in 6 hours.  Patient also meets criteria for IV--> PO conversion of protonix.  Start protonix 40mg  PO daily.  [employer], Pharm.D., BCPS Clinical Pharmacist Pager [phone]  09/11/2011,10:25 AM

## 2011-09-11 NOTE — Progress Notes (Signed)
[facility]  Subjective: Alert. Complains of generalized weakness and some dizziness especially when standing. Currently no chest pain or shortness of breath.  Objective: Blood pressure 122/73, pulse 114, temperature 98.2 F (36.8 C), temperature source Oral, resp. rate 21, height 5\' 8"  (1.727 m), weight 92 kg (202 lb 13.2 oz), SpO2 96.00%.  Intake/Output from previous day: 03/17 0701 - 03/18 0700 In: 2026.4 [I.V.:1376.4; IV Piggyback:650] Out: 1300 [Urine:1300] Intake/Output this shift: Total I/O In: 957.8 [P.O.:240; I.V.:467.8; IV Piggyback:250] Out: -   General appearance: alert, cooperative, appears stated age and no distress Resp: clear to auscultation bilaterally but decreased right side somewhat, on 2 L nasal cannula oxygen with saturations 97% had transient tachypnea during the night Cardio: regular rate and rhythm, S1, S2 normal, no murmur, click, rub or gallop, IV fluid at 75 cc per hour, IV heparin infusing GI: soft, non-tender; bowel sounds normal; no masses,  no organomegaly Extremities: extremities normal, atraumatic, no cyanosis or edema Neurologic: Grossly normal  Lab Results:  Basename 09/11/11 0313 09/10/11 1447  WBC 12.3* 11.0*  HGB 12.1* 13.4  HCT 35.1* 39.0  PLT 321 345   BMET  Basename 09/11/11 0313 09/10/11 1447 09/10/11 0606  NA 133* -- 136  K 3.8 -- 4.0  CL 99 -- 100  CO2 24 -- 26  GLUCOSE 111* -- 126*  BUN 11 -- 15  CREATININE 0.89 0.90 --  CALCIUM 8.4 -- 9.1   Medications:  I have reviewed the patient's current medications.  Assessment/Plan:  Acute respiratory failure with hypoxia/ *HCAP (healthcare-associated pneumonia) *Stable from a respiratory standpoint but still requiring nasal cannula oxygen *Continue empiric antibiotics of Levaquin, Zosyn, and vancomycin *Continue supportive care with oxygen, nebulizers, and pulmonary toileting  NSTEMI (non-ST elevated myocardial infarction)/Abnormal EKG *Cardiology is following-initial  EKG was sinus rhythm with ST depression inferior lateral leads *Peak troponin I was 2.14 at [facility] and current trend is downward. May have had acute ischemic event 24-48 hours prior to presentation therefore troponin may have been higher *Suspect etiology secondary to pneumonia process with concurrent hypoxemia *Consideration for possible cardiac catheterization this hospitalization once pneumonia adequately treated otherwise stable from a respiratory standpoint *ECHO shows severe hypokinesis of the inferior myocardium as well as moderately reduced systolic function. Patient had echo done at [facility] back in February and Dr. Donnie Aho with cardiology is attempting to obtain this result for comparison *Continue aspirin, Lipitor, metoprolol and IV heparin (pharmacy managing heparin)  Systolic heart failure, NYHA class II/severe mitral regurgitation *Not sure if this is a new finding secondary to acute coronary ischemia or progression of underlying systolic dysfunction. Echo from [facility] has been requested *Also found to have severe MR and again unclear if this is new or acute finding-either problem could contribute to acute decompensated heart failure *Change Lasix to 40 mg every 12 hours and add potassium to prevent hypokalemia  Leukocytosis/Dehydration *Initially felt to be dehydrated secondary to pneumonia but chest x-ray looks more consistent with pulmonary edema secondary to acute systolic heart failure *Suspect leukocytosis could be related to acute coronary ischemia as well  Weakness generalized *Likely secondary to healthcare acquired pneumonia and acute coronary ischemia  S/P carotid endarterectomy *Stable without any neurological deficits  Hyperlipemia *On statin therapy with Pravachol prior to admission *Lipid panel pending this admission   Chronic back pain *Continue home Flexeril and Vicodin  Tobacco abuse *Nicotine patch in place *Patient counseled on beginning cessation  given multiple vascular problems  Disposition *Remain in step down unit  LOS: 1 day   Junious Silk, ANP pager [phone]  [facility] [email or web address] Password: [email or web address]  09/11/2011, 12:54 PM  I have personally examined this patient and reviewed the entire database. I have reviewed the above note, made any necessary editorial changes, and agree with its content.  Lonia Blood, MD [facility]

## 2011-09-12 ENCOUNTER — Inpatient Hospital Stay (HOSPITAL_COMMUNITY): Payer: Medicaid Other

## 2011-09-12 LAB — COMPREHENSIVE METABOLIC PANEL
ALT: 13 U/L (ref 0–53)
AST: 26 U/L (ref 0–37)
Albumin: 3.3 g/dL — ABNORMAL LOW (ref 3.5–5.2)
Alkaline Phosphatase: 60 U/L (ref 39–117)
Calcium: 9.1 mg/dL (ref 8.4–10.5)
GFR calc Af Amer: 23 mL/min — ABNORMAL LOW (ref >90)
Glucose, Bld: 106 mg/dL — ABNORMAL HIGH (ref 70–99)
Potassium: 5.1 mEq/L (ref 3.5–5.1)
Sodium: 135 mEq/L (ref 135–145)
Total Protein: 7.2 g/dL (ref 6.0–8.3)

## 2011-09-12 LAB — BASIC METABOLIC PANEL
BUN: 29 mg/dL — ABNORMAL HIGH (ref 6–23)
CO2: 24 mEq/L (ref 19–32)
Calcium: 8.9 mg/dL (ref 8.4–10.5)
Chloride: 99 mEq/L (ref 96–112)
Creatinine, Ser: 3.57 mg/dL — ABNORMAL HIGH (ref 0.50–1.35)
GFR calc non Af Amer: 21 mL/min — ABNORMAL LOW (ref >90)
Glucose, Bld: 126 mg/dL — ABNORMAL HIGH (ref 70–99)
Glucose, Bld: 110 mg/dL — ABNORMAL HIGH (ref 70–99)
Potassium: 5.4 mEq/L — ABNORMAL HIGH (ref 3.5–5.1)
Sodium: 136 mEq/L (ref 135–145)

## 2011-09-12 LAB — CBC
HCT: 36 % — ABNORMAL LOW (ref 39.0–52.0)
Hemoglobin: 12.6 g/dL — ABNORMAL LOW (ref 13.0–17.0)
MCH: 31.8 pg (ref 26.0–34.0)
MCHC: 35 g/dL (ref 30.0–36.0)
RBC: 3.96 MIL/uL — ABNORMAL LOW (ref 4.22–5.81)

## 2011-09-12 LAB — URINALYSIS, ROUTINE W REFLEX MICROSCOPIC
Bilirubin Urine: NEGATIVE
Ketones, ur: NEGATIVE mg/dL
Leukocytes, UA: NEGATIVE
Nitrite: NEGATIVE
Protein, ur: NEGATIVE mg/dL
Urobilinogen, UA: 0.2 mg/dL (ref 0.0–1.0)
pH: 5 (ref 5.0–8.0)

## 2011-09-12 LAB — HEPARIN LEVEL (UNFRACTIONATED)
Heparin Unfractionated: 0.34 IU/mL (ref 0.30–0.70)
Heparin Unfractionated: 0.32 IU/mL (ref 0.30–0.70)

## 2011-09-12 MED ORDER — MAGNESIUM HYDROXIDE 400 MG/5ML PO SUSP: 30.0000 mL | Freq: Every day | ORAL | Status: DC | PRN

## 2011-09-12 MED ORDER — LEVOFLOXACIN IN D5W 250 MG/50ML IV SOLN
250.0000 mg | INTRAVENOUS | Status: DC
  Filled 2011-09-12: qty 50

## 2011-09-12 MED ORDER — DOCUSATE SODIUM 100 MG PO CAPS
100.0000 mg | ORAL_CAPSULE | Freq: Every day | ORAL | Status: DC | PRN
  Administered 2011-09-12: 100 mg via ORAL
  Filled 2011-09-12: qty 1

## 2011-09-12 NOTE — Progress Notes (Signed)
Subjective:  Mild tightness and SOB when lies down.  Coughing productively.  No chest pain.  Says urinating OK.  Objective:  Vital Signs in the last 24 hours: BP 107/67  Pulse 104  Temp(Src) 98.2 F (36.8 C) (Oral)  Resp 27  Ht 5\' 8"  (1.727 m)  Wt 92 kg (202 lb 13.2 oz)  BMI 30.84 kg/m2  SpO2 93%  Physical Exam: Pleasant obese WM in NAD Lungs: Rales post lung L>R  Cardiac:  Rapid regular rhythm, normal S1 and S2, ? S4, again, no murmur heard today. Abdomen:  Soft, nontender, no masses Extremities:  No edema present  Intake/Output from previous day: 03/18 0701 - 03/19 0700 In: 2364.1 [P.O.:600; I.V.:1264.1; IV Piggyback:500] Out: 350 [Urine:350] Weight change:   Lab Results: Basic Metabolic Panel:  Basename 09/12/11 0846 09/11/11 0313  NA 136 133*  K 5.4* 3.8  CL 99 99  CO2 24 24  GLUCOSE 126* 111*  BUN 23 11  CREATININE 3.04* 0.89   CBC:  Basename 09/12/11 0500 09/11/11 0313 09/10/11 0606  WBC 14.1* 12.3* --  NEUTROABS -- -- 7.2  HGB 12.6* 12.1* --  HCT 36.0* 35.1* --  MCV 90.9 90.9 --  PLT 320 321 --   Cardiac Enzymes:  Basename 09/11/11 0214 09/10/11 2030 09/10/11 1447  CKTOTAL 103 153 114  CKMB 3.9 4.2* 4.8*  CKMBINDEX -- -- --  TROPONINI 0.95* 1.03* 1.33*    Telemetry: Sinus tachycardia  Assessment/Plan:  1. Non STEMI with positive troponin and MB  2. Abnormal ECHO with severe MR ( again did not appreciate murmur at bedside) 3. Mild CHF with EF 40-45% 4. Abnormal EKG c/w ischemia 5. Sinus tachycardia 6. Pneumonia under treatment 7. Acute renal failure ?cause 8. Mitral regurgitation on ECHO  Rec:  1.  Have not gotten [facility] records and will request again. 2. Need to recheck lab and get renal ultrasound,  If Creatinine rising, will need renal consult and hold on cath for now 3. Unexplained sinus tach still possibility that the MR is more acute 4. D/c potassium and diuretics for now.   Darden Palmer.  MD [facility]  09/12/2011,  12:34 PM

## 2011-09-12 NOTE — Consult Note (Signed)
I have seen and examined this patient and agree with the assessment/plan as outlined above by [facility] student. Agree with the above Assessment and plan- appears that from the history, timeline of events and available database- this ARF appears consistent with ATN- likely ischemic with volume depletion/ relative hypotension and preceding NSAID use. Currently non-oliguric but without accurately charted UOP- await renal ultrasound. No acute HD needs and will monitor potassium closely.   Wanette Robison K.,MD 09/12/2011 3:49 PM

## 2011-09-12 NOTE — Progress Notes (Addendum)
ANTICOAGULATION/Antibiotic CONSULT NOTE - Follow Up Consult  Pharmacy Consult for  Heparin for NSTEMI Vancomycin/Zosyn/Levaquin for HCAP  Allergies  Allergen Reactions  . Morphine And Related Hives and Itching  . Other     Peaches cause hives and itching     Patient Measurements: Height: 5\' 8"  (172.7 cm) Weight: 202 lb 13.2 oz (92 kg) IBW/kg (Calculated) : 68.4  Heparin Dosing Weight: 87 kg  Vital Signs: Temp: 98.4 F (36.9 C) (03/19 0829) Temp src: Oral (03/19 0829) BP: 112/88 mmHg (03/19 0829) Pulse Rate: 110  (03/19 0829)  Labs:  Alvira Philips 09/12/11 0512 09/12/11 0500 09/11/11 1805 09/11/11 0810 09/11/11 0313 09/11/11 0214 09/10/11 2030 09/10/11 1447 09/10/11 0606  HGB -- 12.6* -- -- 12.1* -- -- -- --  HCT -- 36.0* -- -- 35.1* -- -- 39.0 --  PLT -- 320 -- -- 321 -- -- 345 --  APTT -- -- -- -- -- -- -- -- --  LABPROT -- -- -- -- -- -- -- -- --  INR -- -- -- -- -- -- -- -- --  HEPARINUNFRC 0.34 -- 0.33 0.17* -- -- -- -- --  CREATININE -- -- -- -- 0.89 -- -- 0.90 0.98  CKTOTAL -- -- -- -- -- 103 153 114 --  CKMB -- -- -- -- -- 3.9 4.2* 4.8* --  TROPONINI -- -- -- -- -- 0.95* 1.03* 1.33* --   Estimated Creatinine Clearance: 102 ml/min (by C-G formula based on Cr of 0.89).   Medications:  Scheduled:     . aspirin EC  325 mg Oral Daily  . atorvastatin  40 mg Oral q1800  . furosemide  40 mg Oral BID  . ipratropium  0.5 mg Nebulization Q6H  . levalbuterol  0.63 mg Nebulization Q6H  . levofloxacin (LEVAQUIN) IV  750 mg Intravenous Q24H  . metoprolol tartrate  25 mg Oral BID  . nicotine  21 mg Transdermal Daily  . pantoprazole  40 mg Oral Q1200  . piperacillin-tazobactam (ZOSYN)  IV  3.375 g Intravenous Q8H  . potassium chloride  20 mEq Oral BID  . sodium chloride  3 mL Intravenous Q12H  . vancomycin  1,000 mg Intravenous Q8H  . DISCONTD: furosemide  40 mg Intravenous Q12H  . DISCONTD: furosemide  40 mg Oral BID  . DISCONTD: metoprolol tartrate  12.5 mg Oral BID    . DISCONTD: pantoprazole (PROTONIX) IV  40 mg Intravenous Q1200   Infusions:     . sodium chloride 10 mL/hr at 09/11/11 1445  . heparin 1,600 Units/hr (09/12/11 0358)    Assessment/Plan 1)NSTEMI-currently on IV UFH which continues to be at goal without bleeding complications. CBC stable. Consider cath once pneumonia is stable. ACS best practices with aspirin, metoprolol, lipitor.  2)HCAP - day #2 vancomycin/zosyn/levaquin, no fevers noted, wbc increasing to 14 today. Blood cxs-ngtd, respiratory cx needs recollecting. Renal function worsening CrCl <30 ml/min with concern for ATN. Antibiotics narrowed to levaquin alone today.  Goal of Therapy:  Heparin level 0.3-0.7 units/ml Vancomycin trough 15-20 Renal adjustment of antibiotics  Plan:  Continue heparin at 1600 units/hr. F/U heparin level in AM Change levofloxacin to 750 mg IV q48h. Follow up SCr, UOP, cultures, clinical course and adjust as clinically indicated.   Severiano Gilbert 9:29 AM 09/12/2011  Lovenia Kim Pharm.D., BCPS Clinical Pharmacist 09/12/2011 3:05 PM Pager: [phone] Phone: [phone]

## 2011-09-12 NOTE — Progress Notes (Signed)
[facility]  Subjective: Continues to endorse coughing but now is nonproductive and feels like is getting stuck in his throat. Denies orthopnea, chest pain or shortness of breath.  Objective: Blood pressure 107/67, pulse 104, temperature 98.2 F (36.8 C), temperature source Oral, resp. rate 27, height 5\' 8"  (1.727 m), weight 92 kg (202 lb 13.2 oz), SpO2 93.00%.  Intake/Output from previous day: 03/18 0701 - 03/19 0700 In: 2364.1 [P.O.:600; I.V.:1264.1; IV Piggyback:500] Out: 350 [Urine:350] Intake/Output this shift: Total I/O In: 344 [P.O.:240; I.V.:104] Out: 100 [Urine:100]  General appearance: alert, cooperative, appears stated age and no distress Resp: clear to auscultation bilaterally but decreased right side somewhat, on 2 L nasal cannula oxygen with saturations 97% had transient tachypnea during the night Cardio: regular rate and rhythm, S1, S2 normal, no murmur, click, rub or gallop, IV fluid at the open rate, IV heparin infusing GI: soft, non-tender; bowel sounds normal; no masses,  no organomegaly Extremities: extremities normal, atraumatic, no cyanosis or edema Neurologic: Grossly normal  Lab Results:  Basename 09/12/11 0500 09/11/11 0313  WBC 14.1* 12.3*  HGB 12.6* 12.1*  HCT 36.0* 35.1*  PLT 320 321   BMET  Basename 09/12/11 0846 09/11/11 0313  NA 136 133*  K 5.4* 3.8  CL 99 99  CO2 24 24  GLUCOSE 126* 111*  BUN 23 11  CREATININE 3.04* 0.89  CALCIUM 8.9 8.4   Medications:  I have reviewed the patient's current medications.  Assessment/Plan:  Acute respiratory failure with hypoxia/ *HCAP (healthcare-associated pneumonia) *Stable from a respiratory standpoint but still requiring nasal cannula oxygen *Continue empiric antibiotics of Levaquin but we'll go ahead and discontinue Zosyn and vancomycin *Continue supportive care with oxygen, nebulizers, and pulmonary toileting  NSTEMI (non-ST elevated myocardial infarction)/Abnormal EKG *Cardiology is  following-initial EKG was sinus rhythm with ST depression inferior lateral leads *Peak troponin I was 2.14 at [facility] and current trend is downward. May have had acute ischemic event 24-48 hours prior to presentation therefore troponin may have been higher *Suspect etiology secondary to pneumonia process with concurrent hypoxemia *Cardiology plans to proceed with cardiac catheterization this hospitalization once pneumonia adequately treated otherwise stable from a respiratory standpoint *ECHO shows severe hypokinesis of the inferior myocardium as well as moderately reduced systolic function. Patient had echo done at [facility] back in February and Dr. Donnie Aho with cardiology is attempting to obtain this result for comparison *Continue aspirin, Lipitor, metoprolol and IV heparin (pharmacy managing heparin)  Systolic heart failure, NYHA class II/severe mitral regurgitation *Not sure if this is a new finding secondary to acute coronary ischemia or progression of underlying systolic dysfunction. Echo from [facility] has been requested *Also found to have severe MR and again unclear if this is new or acute finding-either problem could contribute to acute decompensated heart failure *Continue Lasix to 40 mg every 12 hours with potassium to prevent hypokalemia *I and O. is innaccurate due to patient voiding in the toilet. [name] weight has decreased nearly 2 kg over the past 24 hours-today's weight is 90.6 kg  Leukocytosis/Dehydration *Initially felt to be dehydrated secondary to pneumonia but chest x-ray looks more consistent with pulmonary edema secondary to acute systolic heart failure *Suspect leukocytosis could be related to acute coronary ischemia as well  Weakness generalized *Likely secondary to healthcare acquired pneumonia and acute coronary ischemia  S/P carotid endarterectomy *Stable without any neurological deficits  Hyperlipemia *On statin therapy with Pravachol prior to  admission *Lipid panel pending this admission   Chronic back pain *Continue  home Flexeril and Vicodin  Tobacco abuse *Nicotine patch in place *Patient counseled on beginning cessation given multiple vascular problems  Disposition *Remain in step down unit   LOS: 2 days   Junious Silk, ANP pager [phone]  [facility] [email or web address] Password: [email or web address]  09/12/2011, 11:51 AM  I have examined the patient and reveiwed the chart. Elevation is Cr and K+ is concerning. Dr Donnie Aho has stopped diuretics and KCL today. I will ask pharmacy to redose Levaquin- and other meds for the renal failure.    Calvert Cantor, MD [phone]

## 2011-09-12 NOTE — Consult Note (Signed)
[facility] Consult Note [facility]  Date: 09/12/2011  Patient name: Charles Savage Medical record number: [id number] Date of birth: [birth year] Age: 56 y.o. Gender: male PCP: Colette Ribas, MD, MD  Medical Service: Nephrology      Chief Complaint: HCAP, NSTEMI, Acute Renal Failure  History of Present Illness: Charles Savage is a 56 year old male with a history of CVA in FEB 2013, Carotid artery disease s/p L carotid endardectomy in FEB, Chronic pain, HLD, tobacco abuse who presented to the ED at [facility] on 3-17 with the chief complaint of SOB x 3days. Pt was found to have an elevated troponin of 2.14 and EKG changes suggestive of NSTEMI and was transferred to [facility]. Cardiac enzymes were cycled at [facility] and pt has had positive enzymes x 3 which is suggestive of NSTEMI. Cardiology is following the pt and he will require a cardiac cath when stable enough to undergo the procedure. Pt had echo done which shows an EF of 40-45% and sever MR. Pt is currently being medically managed with Heparin infusion, ASA, Lipitor, Lopressor  Pt was also noted to have a productive cough of thick yellow mucus, subjective fevers, and chills, pleuritc chest pain, wheezing,  and SOB for 3 days prior to coming into the ED. CXR was obtained and showed a RUL pnuemonia. Pt was initially started on Vanc, Zosyn and Levoquin for HCAP and is now only on Levaquin. Blood cultures were obtained and show no growth. Resp cultures were obtained and are thought not to be representative of lower resp tract secretion.   Nephrology is beng consulted for an acute worsening of renal function. The pt was noted to have a Scr today of 3.04 increased from 0.89 yesterday. GFR today is 21 decreased from >90 yesterday. BUN today 23 increased from 11 yesterday. The pt was receiving 40mg  lasix q12 and 20 meq of K+ BID. These medications have currently been discontinued. The pt now has an elevated K+ at 5.4 increased from 3.8  yesterday.   Currently the pt states he is feeling better. He denies ever having any issues with his kidneys in the past. He denies any CP but does state some SOB with minimal exertion. He denies SOB at rest. He denies any palpitations. He denies any swelling in his extremities. The pt sates he still has a productive cough and describes the mucus and brown/green and thick. He states he is able to expectorate the sputum. The pt currently denies any wheezing. He currently denies any chills or night sweats. The pt does state one episode of nausea today that occurred while he was eating. He states it resolved without any medical therapy and he denies any vomiting. The pt denies any problems with urination. He denies any decrease in force/stream. He denies any feelings on incomplete emptying. He denies any decrease in urinary output. He denies any dysuria or hematuria. The pt does state that he is constipated and has not had a bowel movement since this past Saturday. He does endorse some anxiety about what is going on with his heart. He denies having any lasting deficits from his CVA in Feb.    Meds: Medications Prior to Admission  Medication Dose Route Frequency Provider Last Rate Last Dose  . 0.9 %  sodium chloride infusion   Intravenous Continuous Russella Dar, NP 10 mL/hr at 09/11/11 1445    . acetaminophen (TYLENOL) tablet 650 mg  650 mg Oral Q6H PRN Ripudeep Jenna Luo, MD  Or  . acetaminophen (TYLENOL) suppository 650 mg  650 mg Rectal Q6H PRN Ripudeep K Rai, MD      . albuterol (PROVENTIL) (5 MG/ML) 0.5% nebulizer solution 2.5 mg  2.5 mg Nebulization Once [employer]. Colon Branch, MD   2.5 mg at 09/10/11 1610  . albuterol (PROVENTIL) (5 MG/ML) 0.5% nebulizer solution 2.5 mg  2.5 mg Nebulization Q2H PRN Ripudeep K Rai, MD   2.5 mg at 09/10/11 1530  . alum & mag hydroxide-simeth (MAALOX/MYLANTA) 200-200-20 MG/5ML suspension 30 mL  30 mL Oral Q6H PRN Ripudeep K Rai, MD      . aspirin EC tablet 325 mg  325 mg  Oral Daily Ripudeep Jenna Luo, MD   325 mg at 09/12/11 0924  . aspirin tablet 325 mg  325 mg Oral Once [employer]. Colon Branch, MD   325 mg at 09/10/11 0728  . atorvastatin (LIPITOR) tablet 40 mg  40 mg Oral q1800 Othella Boyer, MD   40 mg at 09/11/11 1833  . azithromycin (ZITHROMAX) 500 mg in dextrose 5 % 250 mL IVPB  500 mg Intravenous Once Nicoletta Dress. Colon Branch, MD   500 mg at 09/10/11 9604  . cefTRIAXone (ROCEPHIN) 1 g in dextrose 5 % 50 mL IVPB  1 g Intravenous Once Nicoletta Dress. Colon Branch, MD   1 g at 09/10/11 0729  . cyclobenzaprine (FLEXERIL) tablet 10 mg  10 mg Oral TID PRN Ripudeep Jenna Luo, MD   10 mg at 09/11/11 1535  . docusate sodium (COLACE) capsule 100 mg  100 mg Oral Daily PRN [place] C. Terressa Koyanagi, MD   100 mg at 09/12/11 0533  . guaiFENesin-dextromethorphan (ROBITUSSIN DM) 100-10 MG/5ML syrup 5 mL  5 mL Oral Q4H PRN Ripudeep K Rai, MD      . heparin ADULT infusion 100 units/mL (25000 units/250 mL)  1,600 Units/hr Intravenous Continuous Judie Bonus Hammons, PHARMD 16 mL/hr at 09/12/11 0358 1,600 Units/hr at 09/12/11 0358  . HYDROcodone-acetaminophen (NORCO) 5-325 MG per tablet 1-2 tablet  1-2 tablet Oral Q4H PRN Ripudeep Jenna Luo, MD   2 tablet at 09/11/11 1534  . HYDROmorphone (DILAUDID) injection 1 mg  1 mg Intravenous Once Benny Lennert, MD   1 mg at 09/10/11 0855  . HYDROmorphone (DILAUDID) injection 1 mg  1 mg Intravenous Q4H PRN Ripudeep Jenna Luo, MD   1 mg at 09/11/11 1138  . ipratropium (ATROVENT) nebulizer solution 0.5 mg  0.5 mg Nebulization Once [employer]. Colon Branch, MD   0.5 mg at 09/10/11 5409  . ipratropium (ATROVENT) nebulizer solution 0.5 mg  0.5 mg Nebulization Q6H Ripudeep K Rai, MD   0.5 mg at 09/12/11 0810  . levalbuterol (XOPENEX) nebulizer solution 0.63 mg  0.63 mg Nebulization Q6H Ripudeep K Rai, MD   0.63 mg at 09/12/11 0810  . Levofloxacin (LEVAQUIN) IVPB 750 mg  750 mg Intravenous Q24H Ripudeep K Rai, MD   750 mg at 09/10/11 1610  . magnesium hydroxide (MILK OF MAGNESIA) suspension 30 mL   30 mL Oral Daily PRN [place] C. Balfour, MD      . metoprolol tartrate (LOPRESSOR) tablet 25 mg  25 mg Oral BID Othella Boyer, MD   25 mg at 09/12/11 0924  . nicotine (NICODERM CQ - dosed in mg/24 hours) patch 21 mg  21 mg Transdermal Daily Ripudeep Jenna Luo, MD   21 mg at 09/12/11 0927  . nitroGLYCERIN (NITROGLYN) 2 % ointment 1 inch  1 inch Topical Once Nicoletta Dress. Colon Branch, MD   1  inch at 09/10/11 0825  . ondansetron (ZOFRAN) tablet 4 mg  4 mg Oral Q6H PRN Ripudeep K Rai, MD       Or  . ondansetron (ZOFRAN) injection 4 mg  4 mg Intravenous Q6H PRN Ripudeep K Rai, MD      . pantoprazole (PROTONIX) EC tablet 40 mg  40 mg Oral Q1200 Kimberly Ballard Hammons, PHARMD   40 mg at 09/12/11 1150  . sodium chloride 0.9 % injection 3 mL  3 mL Intravenous Q12H Ripudeep Jenna Luo, MD   3 mL at 09/12/11 0924  . DISCONTD: albuterol (PROVENTIL) (5 MG/ML) 0.5% nebulizer solution 2.5 mg  2.5 mg Nebulization Q6H Ripudeep Jenna Luo, MD      . DISCONTD: Chlorhexidine Gluconate Cloth 2 % PADS 6 each  6 each Topical Q0600 Nolon Lennert, MD      . DISCONTD: Chlorhexidine Gluconate Cloth 2 % PADS 6 each  6 each Topical Q0600 Nolon Lennert, MD      . DISCONTD: enoxaparin (LOVENOX) injection 40 mg  40 mg Subcutaneous Q24H Ripudeep K Rai, MD      . DISCONTD: furosemide (LASIX) injection 40 mg  40 mg Intravenous Q12H Russella Dar, NP      . DISCONTD: furosemide (LASIX) tablet 40 mg  40 mg Oral BID Othella Boyer, MD   40 mg at 09/11/11 1248  . DISCONTD: furosemide (LASIX) tablet 40 mg  40 mg Oral BID Lonia Blood, MD   40 mg at 09/12/11 0924  . DISCONTD: heparin bolus via infusion 4,000 Units  4,000 Units Intravenous Once Ripudeep Jenna Luo, MD      . DISCONTD: HYDROcodone-acetaminophen (NORCO) 10-325 MG per tablet 1 tablet  1 tablet Oral Q8H PRN Ripudeep Jenna Luo, MD      . DISCONTD: metoprolol tartrate (LOPRESSOR) tablet 12.5 mg  12.5 mg Oral BID Ripudeep Jenna Luo, MD   12.5 mg at 09/11/11 0854  . DISCONTD: mupirocin ointment  (BACTROBAN) 2 % 1 application  1 application Nasal BID Nolon Lennert, MD      . DISCONTD: mupirocin ointment (BACTROBAN) 2 % 1 application  1 application Nasal BID Nolon Lennert, MD      . DISCONTD: pantoprazole (PROTONIX) injection 40 mg  40 mg Intravenous Q1200 Ripudeep K Rai, MD      . DISCONTD: piperacillin-tazobactam (ZOSYN) IVPB 3.375 g  3.375 g Intravenous Q8H Colleen E Summe, PHARMD   3.375 g at 09/12/11 0347  . DISCONTD: potassium chloride SA (K-DUR,KLOR-CON) CR tablet 20 mEq  20 mEq Oral BID Russella Dar, NP   20 mEq at 09/12/11 0924  . DISCONTD: simvastatin (ZOCOR) tablet 10 mg  10 mg Oral q1800 Ripudeep K Rai, MD      . DISCONTD: vancomycin (VANCOCIN) IVPB 1000 mg/200 mL premix  1,000 mg Intravenous Q8H Colleen E Summe, PHARMD   1,000 mg at 09/12/11 0346   Medications Prior to Admission  Medication Sig Dispense Refill  . cyclobenzaprine (FLEXERIL) 10 MG tablet Take 10 mg by mouth 3 (three) times daily as needed.        Marland Kitchen HYDROcodone-acetaminophen (NORCO) 10-325 MG per tablet Take 1 tablet by mouth every 8 (eight) hours as needed. Pain      . meloxicam (MOBIC) 15 MG tablet Take 15 mg by mouth as needed.          Allergies: Morphine and related and Other Past Medical History  Diagnosis Date  . Chronic pain  back pain  . Stroke   . Hyperlipemia   . Lumbar disc disease   . Carotid artery disease    Past Surgical History  Procedure Date  . Back surgery [id number]  . Knee arthroscopy 2002    rt  . Foreign body removal 06/05/2011    Procedure: FOREIGN BODY REMOVAL ADULT;  Surgeon: Rosalio Macadamia;  Location: [facility];  Service: Plastics;  Laterality: Right;  removal foreign body from right side face   . Carotid endarterectomy    No family history on file. History   Social History  . Marital Status: Legally Separated    Spouse Name: N/A    Number of Children: N/A  . Years of Education: N/A   Occupational History  . Not on file.   Social  History Main Topics  . Smoking status: Current Everyday Smoker -- 2.0 packs/day for 30 years    Types: Cigarettes  . Smokeless tobacco: Not on file  . Alcohol Use: No  . Drug Use: No  . Sexually Active:    Other Topics Concern  . 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    Review of Systems: As stated in HPI  Physical Exam: Blood pressure 107/67, pulse 104, temperature 98.2 F (36.8 C), temperature source Oral, resp. rate 27, height 5\' 8"  (1.727 m), weight 92 kg (202 lb 13.2 oz), SpO2 93.00%. General: Pleasant obese WM in NAD  HEENT: anicteric sclera, pupils equal and reactive to light and accomodation,  Neck: supple, no masses or lymphadenopathy, no goiter, left carotid endarterectomy scar Lungs: bibasilar rales, Diffuse rhonchi that improves with cough, no wheezes Cardiac: Rapid regular rhythm, normal S1 and S2, no murmur  Abdomen: Soft, nontender, no masses  Extremities: trace pedal edema noted Neuro: Alert and Oriented X 3, responds appropriately, equal strength in all extremities.  Skin: Warm, dry, no lesions or rashes noted  Lab results: Basic Metabolic Panel:  Lab 09/12/11 5409 09/11/11 0313 09/10/11 1447 09/10/11 0606  NA 136 133* -- 136  K 5.4* 3.8 -- 4.0  CL 99 99 -- 100  CO2 24 24 -- 26  GLUCOSE 126* 111* -- 126*  BUN 23 11 -- 15  CREATININE 3.04* 0.89 0.90 --  CALCIUM 8.9 8.4 -- 9.1  ALB -- -- -- --  PHOS -- -- -- --   Liver Function Tests: No results found for this basename: AST:3,ALT:3,ALKPHOS:3,BILITOT:3,PROT:3,ALBUMIN:3 in the last 168 hours No results found for this basename: LIPASE:3,AMYLASE:3 in the last 168 hours No results found for this basename: AMMONIA:3 in the last 168 hours INR: @resultsinr3 @ CBC:  Lab 09/12/11 0500 09/11/11 0313 09/10/11 1447 09/10/11 0606  WBC 14.1* 12.3* 11.0* --  NEUTROABS -- -- -- 7.2  HGB 12.6* 12.1* 13.4 --  HCT 36.0* 35.1* 39.0 --  MCV 90.9 90.9 91.5  93.1  PLT 320 321 345 --   Blood Culture    Component Value Date/Time   SDES SPUTUM 09/11/2011 1836   SPECREQUEST NONE 09/11/2011 1836   CULT        BLOOD CULTURE RECEIVED NO GROWTH TO DATE CULTURE WILL BE HELD FOR 5 DAYS BEFORE ISSUING A FINAL NEGATIVE REPORT 09/10/2011 1535   REPTSTATUS 09/11/2011 FINAL 09/11/2011 1836    Cardiac Enzymes:  Lab 09/11/11 0214 09/10/11 2030 09/10/11 1447 09/10/11 0606  CKTOTAL 103 153 114 --  CKMB 3.9 4.2* 4.8* --  CKMBINDEX -- -- -- --  TROPONINI 0.95* 1.03* 1.33* 2.14*   CBG: No results found for this basename: GLUCAP:5 in the last 168 hours Iron Studies: No results found for this basename: IRON,TIBC,TRANSFERRIN,FERRITIN in the last 72 hours  Micro Results: Recent Results (from the past 240 hour(s))  CULTURE, BLOOD (ROUTINE X 2)     Status: Normal (Preliminary result)   Collection Time   09/10/11  3:34 PM      Component Value Range Status Comment   Specimen Description BLOOD RIGHT HAND   Final    Special Requests BOTTLES DRAWN AEROBIC AND ANAEROBIC 10CC   Final    Culture  Setup Time [id number]   Final    Culture     Final    Value:        BLOOD CULTURE RECEIVED NO GROWTH TO DATE CULTURE WILL BE HELD FOR 5 DAYS BEFORE ISSUING A FINAL NEGATIVE REPORT   Report Status PENDING   Incomplete   CULTURE, BLOOD (ROUTINE X 2)     Status: Normal (Preliminary result)   Collection Time   09/10/11  3:35 PM      Component Value Range Status Comment   Specimen Description BLOOD RIGHT ARM   Final    Special Requests BOTTLES DRAWN AEROBIC AND ANAEROBIC 10CC   Final    Culture  Setup Time [id number]   Final    Culture     Final    Value:        BLOOD CULTURE RECEIVED NO GROWTH TO DATE CULTURE WILL BE HELD FOR 5 DAYS BEFORE ISSUING A FINAL NEGATIVE REPORT   Report Status PENDING   Incomplete   CULTURE, SPUTUM-ASSESSMENT     Status: Normal   Collection Time   09/11/11  6:36 PM      Component Value Range Status Comment   Specimen Description SPUTUM   Final     Special Requests NONE   Final    Sputum evaluation     Final    Value: MICROSCOPIC FINDINGS SUGGEST THAT THIS SPECIMEN IS NOT REPRESENTATIVE OF LOWER RESPIRATORY SECRETIONS. PLEASE RECOLLECT.     CALLED TO RN E.SMITH AT 1923 09/11/11 BY L.PITT   Report Status 09/11/2011 FINAL   Final    Studies/Results: Dg Chest Port 1 View  09/11/2011  *RADIOLOGY REPORT*  Clinical Data: Dyspnea.  PORTABLE CHEST - 1 VIEW  Comparison: Chest radiograph performed earlier today at 06:31 a.m.  Findings: The lungs are well-aerated.  There has been interval redistribution of bilateral airspace opacities.  Given the appearance on the prior study, this remains concerning for pneumonia, though interstitial edema could have a similar appearance.  Underlying vascular congestion is noted.  A small left pleural effusion is suspected.  No pneumothorax is seen.  The cardiomediastinal silhouette remains borderline normal in size. No acute osseous abnormalities are identified.  IMPRESSION: Persistent bilateral airspace opacities; this remains concerning for pneumonia, though interstitial edema could have a similar appearance.  Underlying vascular congestion and small left pleural effusion noted.  Original Report Authenticated By: Tonia Ghent, M.D.    Other results: EKG:  Assessment & Plan by Problem: 1. HCAP-  CXR shows Persistent bilateral airspace opacities; this remains concerning for pneumonia, pt currently on Levaquin. Requiring O2 at 2lpm [place]. Pt states improvement in sob however it is still present with minimal exertion. Continues to have productive cough with thick brown/green mucus. Resp cultures were obtained but thought no to be representative of Lower resp tract secretions. Continue abx therapy.  2. NSTEMI- Troponin positive  x3. Cardiology following. Cardiology plans to proceed with cardiac catheterization this hospitalization once pneumonia adequately treated and kidney function stable.  3. Acute Renal Failure-  Possible that renal injury is from episodes of hypovolemia and concurrent mobic use. Cannot rule out obstruction at this point. The pt was noted to have a Scr today of 3.04 increased from 0.89 yesterday. GFR today is 21 decreased from >90 yesterday. BUN today 23 increased from 11 yesterday. Urinalysis ordered , Renal US ordered, Urine protein/creatinie ratio ordered. No results yet available. I&O are inaccurate due to pt voiding in the toilet. Will need strict I&O and daily weights since Acute Renal Failure with CHF. Renal panel in AM.  4. CHF- Echo shows EF 40-45 %, CXR shows underlying vascular congestion and small left pleural effusion noted. Pt was receiving lasix 40mg  q12 but it was discontinued due to concerns over renal function.  5. Severe MR- Echo suggestive of severe MR, pt had recent echo done at [facility]. Cardiology is requesting those records to see if this is a new finding.  6. HLD- on Statin therapy 7. Hyperkalemia- last K= was 5.4 increased from 3.8 yesterday. Pt was receiving K supplementation at bid while he was on lasix. Both have been discontinued. Continue to monitor.  8. Tobacco abuse- on nicotine patch 9.  Mild anemia- Last HGB 12.6, continue to monitor 10. CVA- occurred in Feb with no residual neurologic deficits 11. Carotid artery disease- s/p carotid endardectomy  This is a Psychologist, occupational Note.  The care of the patient was discussed with Dr. Allena Katz and the assessment and plan was formulated with their assistance.  Please see their note for official documentation of the patient encounter.   Signed: Morrell Riddle  PA-S2 09/12/2011, 1:56 PM

## 2011-09-13 ENCOUNTER — Inpatient Hospital Stay (HOSPITAL_COMMUNITY): Payer: Medicaid Other

## 2011-09-13 DIAGNOSIS — N179 Acute kidney failure, unspecified: Secondary | ICD-10-CM | POA: Diagnosis present

## 2011-09-13 LAB — DIFFERENTIAL
Basophils Absolute: 0 10*3/uL (ref 0.0–0.1)
Eosinophils Absolute: 0.3 10*3/uL (ref 0.0–0.7)
Eosinophils Relative: 2 % (ref 0–5)
Lymphocytes Relative: 22 % (ref 12–46)
Lymphs Abs: 2.4 10*3/uL (ref 0.7–4.0)
Monocytes Absolute: 0.9 10*3/uL (ref 0.1–1.0)

## 2011-09-13 LAB — CBC
HCT: 34 % — ABNORMAL LOW (ref 39.0–52.0)
MCH: 31.2 pg (ref 26.0–34.0)
MCV: 90.7 fL (ref 78.0–100.0)
Platelets: 304 10*3/uL (ref 150–400)
RDW: 13.8 % (ref 11.5–15.5)
WBC: 12.3 10*3/uL — ABNORMAL HIGH (ref 4.0–10.5)

## 2011-09-13 LAB — BASIC METABOLIC PANEL
CO2: 22 mEq/L (ref 19–32)
Calcium: 8.9 mg/dL (ref 8.4–10.5)
Chloride: 99 mEq/L (ref 96–112)
Creatinine, Ser: 3.9 mg/dL — ABNORMAL HIGH (ref 0.50–1.35)
Glucose, Bld: 98 mg/dL (ref 70–99)

## 2011-09-13 LAB — HEPARIN LEVEL (UNFRACTIONATED): Heparin Unfractionated: 0.51 IU/mL (ref 0.30–0.70)

## 2011-09-13 MED ORDER — DEXTROSE 5 % IV SOLN
2.0000 g | INTRAVENOUS | Status: AC
  Administered 2011-09-13 – 2011-09-19 (×7): 2 g via INTRAVENOUS
  Filled 2011-09-13 (×7): qty 2

## 2011-09-13 MED ORDER — LEVALBUTEROL HCL 0.63 MG/3ML IN NEBU
0.6300 mg | INHALATION_SOLUTION | Freq: Three times a day (TID) | RESPIRATORY_TRACT | Status: DC
  Administered 2011-09-13 – 2011-09-14 (×6): 0.63 mg via RESPIRATORY_TRACT
  Filled 2011-09-13 (×10): qty 3

## 2011-09-13 MED ORDER — AZITHROMYCIN 500 MG IV SOLR
500.0000 mg | INTRAVENOUS | Status: AC
  Administered 2011-09-13 – 2011-09-17 (×5): 500 mg via INTRAVENOUS
  Filled 2011-09-13 (×5): qty 500

## 2011-09-13 MED ORDER — IPRATROPIUM BROMIDE 0.02 % IN SOLN
0.5000 mg | Freq: Three times a day (TID) | RESPIRATORY_TRACT | Status: DC
  Administered 2011-09-13 – 2011-09-14 (×6): 0.5 mg via RESPIRATORY_TRACT
  Filled 2011-09-13 (×7): qty 2.5

## 2011-09-13 MED ORDER — LEVOFLOXACIN IN D5W 750 MG/150ML IV SOLN: 750.0000 mg | INTRAVENOUS | Status: DC

## 2011-09-13 NOTE — Progress Notes (Signed)
[facility] [place] TEAM 1 - Stepdown/ICU TEAM  Subjective: No significant complaints verbalized by the patient today. No chest pain, shortness of breath, or orthopnea.  Objective: Blood pressure 122/76, pulse 100, temperature 97.5 F (36.4 C), temperature source Oral, resp. rate 12, height 5\' 8"  (1.727 m), weight 92 kg (202 lb 13.2 oz), SpO2 94.00%.  Intake/Output from previous day: 03/19 0701 - 03/20 0700 In: 1381 [P.O.:780; I.V.:601] Out: 575 [Urine:575] Intake/Output this shift: Total I/O In: 240 [P.O.:240] Out: 200 [Urine:200]  General appearance: alert, cooperative, appears stated age and no distress Resp: clear to auscultation bilaterally but decreased right side somewhat, on 2 L nasal cannula oxygen with saturations 97% Cardio: regular rate and rhythm, S1, S2 normal, no murmur, click, rub or gallop, IV fluid at the open rate, IV heparin infusing GI: soft, non-tender; bowel sounds normal; no masses,  no organomegaly Extremities: extremities normal, atraumatic, no cyanosis or edema Neurologic: Grossly normal  Lab Results:  Basename 09/13/11 0455 09/12/11 0500  WBC 12.3* 14.1*  HGB 11.7* 12.6*  HCT 34.0* 36.0*  PLT 304 320   BMET  Basename 09/13/11 0455 09/12/11 2006  NA 135 136  K 4.4 4.3  CL 99 99  CO2 22 24  GLUCOSE 98 110*  BUN 32* 29*  CREATININE 3.90* 3.57*  CALCIUM 8.9 8.9   Medications:  I have reviewed the patient's current medications.  Assessment/Plan:  Acute respiratory failure with hypoxia/ *HCAP (healthcare-associated pneumonia) *Stable from a respiratory standpoint but still requiring nasal cannula oxygen *Antibiotics narrowed yesterday with discontinuation of Zosyn and vancomycin in favor of Levaquin. Today because of progressive renal failure Levaquin was discontinued in favor of Rocephin and azithromycin. *Continue supportive care with oxygen, nebulizers, and pulmonary toileting  NSTEMI (non-ST elevated myocardial  infarction)/Abnormal EKG *Cardiology is following-initial EKG was sinus rhythm with ST depression inferior lateral leads *Peak troponin I was 2.14 at [facility] and current trend is downward. May have had acute ischemic event 24-48 hours prior to presentation therefore troponin may have been higher *Suspect etiology secondary to pneumonia process with concurrent hypoxemia *Cardiology plans to proceed with cardiac catheterization this hospitalization once pneumonia adequately treated otherwise stable from a respiratory standpoint. Also recent problems with acute renal failure are prohibitive to proceeding with immediate cardiac catheterization. *ECHO shows severe hypokinesis of the inferior myocardium as well as moderately reduced systolic function. Patient had echo done at [facility] back in February and Dr. Donnie Aho with cardiology is attempting to obtain this result for comparison *Continue aspirin, Lipitor, metoprolol and IV heparin (pharmacy managing heparin)  Systolic heart failure, NYHA class II/severe mitral regurgitation *Not sure if this is a new finding secondary to acute coronary ischemia or progression of underlying systolic dysfunction. Echo from [facility] has been requested *Also found to have severe MR and again unclear if this is new or acute finding-either problem could contribute to acute decompensated heart failure *Lasix was discontinued due to acute renal failure issues  Acute renal failure secondary to ATN versus acute interstitial nephritis *Creatinine 0.85 in admission and has increased to above 3 as of 09/12/2011 and continues to [place] today *Appreciate renal team assistance *Serological workup in process *Recommendations are to DC Levaquin and start Rocephin and azithromycin instead *Consider renal biopsy on Thursday or Friday if serologies negative and renal function continues to rise today  Leukocytosis/Dehydration *Initially felt to be dehydrated secondary to pneumonia but  chest x-ray looks more consistent with pulmonary edema secondary to acute systolic heart failure *Suspect leukocytosis could  be related to acute coronary ischemia as well *Trend is downward and no left shift  Weakness generalized *Likely secondary to healthcare acquired pneumonia and acute coronary ischemia  S/P carotid endarterectomy *Stable without any neurological deficits  Hyperlipemia *On statin therapy with Pravachol prior to admission *Lipid panel this admission demonstrates suboptimal HDL cholesterol 20 and mildly elevated LDL cholesterol 104   Chronic back pain *Continue home Flexeril and Vicodin  Tobacco abuse *Nicotine patch in place *Patient counseled on beginning cessation given multiple vascular problems  Disposition *Remain in step down unit   LOS: 3 days   Junious Silk, ANP pager [phone]  [facility] [email or web address] Password: [email or web address]  09/13/2011, 11:43 AM  I have personally examined this patient and reviewed the entire database. I have reviewed the above note, made any necessary editorial changes, and agree with its content.  Lonia Blood, MD [facility]

## 2011-09-13 NOTE — Progress Notes (Signed)
ANTICOAGULATION/Antibiotic CONSULT NOTE - Follow Up Consult  Pharmacy Consult for  Heparin for NSTEMI Levaquin for HCAP  Allergies  Allergen Reactions  . Morphine And Related Hives and Itching  . Other     Peaches cause hives and itching     Patient Measurements: Height: 5\' 8"  (172.7 cm) Weight: 202 lb 13.2 oz (92 kg) IBW/kg (Calculated) : 68.4  Heparin Dosing Weight: 87 kg  Vital Signs: Temp: 97.5 F (36.4 C) (03/20 0806) Temp src: Oral (03/20 0806) BP: 122/76 mmHg (03/20 0806) Pulse Rate: 100  (03/20 0806)  Labs:  Alvira Philips 09/13/11 0455 09/12/11 2006 09/12/11 1337 09/12/11 0846 09/12/11 0512 09/12/11 0500 09/11/11 0313 09/11/11 0214 09/10/11 2030 09/10/11 1447  HGB 11.7* -- -- -- -- 12.6* -- -- -- --  HCT 34.0* -- -- -- -- 36.0* 35.1* -- -- --  PLT 304 -- -- -- -- 320 321 -- -- --  APTT -- -- -- -- -- -- -- -- -- --  LABPROT -- -- -- -- -- -- -- -- -- --  INR -- -- -- -- -- -- -- -- -- --  HEPARINUNFRC 0.51 -- -- 0.32 0.34 -- -- -- -- --  CREATININE 3.90* 3.57* 3.29* -- -- -- -- -- -- --  CKTOTAL -- -- -- -- -- -- -- 103 153 114  CKMB -- -- -- -- -- -- -- 3.9 4.2* 4.8*  TROPONINI -- -- -- -- -- -- -- 0.95* 1.03* 1.33*   Estimated Creatinine Clearance: 23.3 ml/min (by C-G formula based on Cr of 3.9).   Medications:  Scheduled:     . aspirin EC  325 mg Oral Daily  . atorvastatin  40 mg Oral q1800  . ipratropium  0.5 mg Nebulization TID  . levalbuterol  0.63 mg Nebulization TID  . levofloxacin (LEVAQUIN) IV  750 mg Intravenous Q48H  . metoprolol tartrate  25 mg Oral BID  . nicotine  21 mg Transdermal Daily  . pantoprazole  40 mg Oral Q1200  . sodium chloride  3 mL Intravenous Q12H  . DISCONTD: furosemide  40 mg Oral BID  . DISCONTD: ipratropium  0.5 mg Nebulization Q6H  . DISCONTD: levalbuterol  0.63 mg Nebulization Q6H  . DISCONTD: levofloxacin (LEVAQUIN) IV  250 mg Intravenous Q24H  . DISCONTD: levofloxacin (LEVAQUIN) IV  750 mg Intravenous Q24H  .  DISCONTD: piperacillin-tazobactam (ZOSYN)  IV  3.375 g Intravenous Q8H  . DISCONTD: potassium chloride  20 mEq Oral BID  . DISCONTD: vancomycin  1,000 mg Intravenous Q8H   Infusions:     . sodium chloride 10 mL/hr at 09/11/11 1445  . heparin 1,600 Units/hr (09/12/11 1949)    Assessment/Plan 1)NSTEMI-currently on IV UFH which continues to be at goal without bleeding complications. HGB 11.7 down from 12.6 platelet count stable. Considering cath once pneumonia and now renal function is stable. ACS best practices with aspirin, metoprolol, lipitor.  2)HCAP - day #3 levaquin, no fevers noted, wbc down to 12 today. Blood cxs-ngtd, respiratory cx needs recollecting. Renal function worsening SCr up to 3.9 today with uop but may not be accurate. Antibiotics narrowed to levaquin alone at HCAP dosing which has been renally adjusted to q 48h. No changes warranted today.  Goal of Therapy:  Heparin level 0.3-0.7 units/ml Renal adjustment of antibiotics  Plan:  Continue heparin at 1600 units/hr. F/U heparin level in AM Continue levofloxacin 750 mg IV q48h. Follow up SCr, UOP, cultures, clinical course and adjust as clinically indicated.  Severiano Gilbert 8:31 AM 09/13/2011

## 2011-09-13 NOTE — Progress Notes (Signed)
I will stop atorvastatin as there have been reports of worsening renal failure associated with it.   Darden Palmer MD [facility]

## 2011-09-13 NOTE — Progress Notes (Signed)
Subjective:  Less cough. Not too much SOB.  Renal failure is worsening.  No chest pain.   Objective:  Vital Signs in the last 24 hours: BP 122/76  Pulse 100  Temp(Src) 97.5 F (36.4 C) (Oral)  Resp 12  Ht 5\' 8"  (1.727 m)  Wt 92 kg (202 lb 13.2 oz)  BMI 30.84 kg/m2  SpO2 94%  Physical Exam: Pleasant obese WM in NAD Lungs: Rales post lung L>R  Cardiac:  Rapid regular rhythm, normal S1 and S2, ? S4, again, no murmur heard today. Abdomen:  Soft, nontender, no masses Extremities:  No edema present  Intake/Output from previous day: 03/19 0701 - 03/20 0700 In: 1381 [P.O.:780; I.V.:601] Out: 575 [Urine:575] Weight change:   Lab Results: Basic Metabolic Panel:  Basename 09/13/11 0455 09/12/11 2006  NA 135 136  K 4.4 4.3  CL 99 99  CO2 22 24  GLUCOSE 98 110*  BUN 32* 29*  CREATININE 3.90* 3.57*   CBC:  Basename 09/13/11 0455 09/12/11 0500  WBC 12.3* 14.1*  NEUTROABS -- --  HGB 11.7* 12.6*  HCT 34.0* 36.0*  MCV 90.7 90.9  PLT 304 320   Cardiac Enzymes:  Basename 09/11/11 0214 09/10/11 2030 09/10/11 1447  CKTOTAL 103 153 114  CKMB 3.9 4.2* 4.8*  CKMBINDEX -- -- --  TROPONINI 0.95* 1.03* 1.33*    Telemetry: Sinus tachycardia  Assessment/Plan:  1. Acute renal failure - nephrology currently seeing.  The clinical course is unusual for dehydration. 2. Abnormal ECHO with severe MR ( again did not appreciate murmur at bedside) 3. Mild CHF with EF 40-45% 4. Abnormal EKG c/w ischemia 5.  Pneumonia under treatment ? If levaquin is causing renal problems 6. Persistent sinus tachycardia  Rec:  1. Have not gotten [facility] records and will request again.  Now 2 days out 2. Hold on cath until renal issues resolved  W. Viann Fish, Montez Hageman.  MD [facility]  09/13/2011, 8:47 AM

## 2011-09-13 NOTE — Progress Notes (Signed)
PA Student Daily Progress Note [facility] Subjective: Pt states he is feeling better. He denies any CP. He states some sob with exertion but states this is improving. He still endorses a productive cough. Pt denies any urinary symptoms or difficulty urinating. Objective:  Filed Vitals:   09/12/11 2355 09/13/11 0005 09/13/11 0340 09/13/11 0350  BP: 101/67  129/84   Pulse: 98  96   Temp:  98.6 F (37 C)  97.5 F (36.4 C)  TempSrc:  Oral  Oral  Resp:   12   Height:      Weight:      SpO2: 93%  92%    Physical Exam: General: Pleasant obese WM in NAD  HEENT: anicteric sclera, pupils equal and reactive to light and accomodation,  Neck: supple, no masses or lymphadenopathy, no goiter, left carotid endarterectomy scar  Lungs: bibasilar rales L>R, Diffuse rhonchi that improves with cough, no wheezes  Cardiac: Rapid regular rhythm, normal S1 and S2, no murmur  Abdomen: Soft, nontender, no masses  Extremities: trace pedal edema noted, somewhat increased from yesterday Neuro: Alert and Oriented X 3, responds appropriately, equal strength in all extremities.  Skin: Warm, dry, no lesions or rashes noted  Assessment/Plan: 1. HCAP- CXR from 18th shows Persistent bilateral airspace opacities; this remains concerning for pneumonia. No new CXR available for review. Consider obtaining CXR today. Pt  Remains on Levaquin. Pt states improvement in sob however it is still present with minimal exertion. Continues to have productive cough with thick brown/green mucus. Resp cultures were obtained but thought no to be representative of Lower resp tract secretions. Continue abx therapy.  2. NSTEMI- Troponin positive x3. Cardiology following. Cardiology plans to proceed with cardiac catheterization this hospitalization once pneumonia adequately treated and kidney function stable.  3. Acute Renal Failure- Possible ATN due to ischemia from volume depletion, relative hypotension and NSAID use. Scr  continue to increase 3.90 today increased from 3.57 yesterday. BUN increased to 32 from 29 yesterday. Urine output from yesterday shows 575 ml out which may not be completely accurate due to pt voiding in the toilet earlier in the day. Pt was advised we needed to keep close monitoring of his output. Will closely follow output today. UA was negative for infection, glucose, HGB, protein. Renal US shows no evidence of obstruction. Total urine protein 6.6, Total urine creatinine 32.71, protein/creatinine ratio high at 0.20. No electrolyte abnormalities noted. No acute needs for HD.  4. CHF- Echo shows EF 40-45 %, CXR shows underlying vascular congestion and small left pleural effusion noted. Pt was receiving lasix 40mg  q12 but it was discontinued due to concerns over renal function. Slight increase in LE edema today.  5. Severe MR- Echo suggestive of severe MR, pt had recent echo done at [facility]. Cardiology is requesting those records to see if this is a new finding.  6. HLD- on Statin therapy  7. Hyperkalemia- lmproved. Continue to monitor.  8. Tobacco abuse- on nicotine patch  9. Mild anemia- Last HGB 11.7, continue to monitor  10. 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 Carotid artery disease- s/p carotid endardectomy  Labs: Basic Metabolic Panel:  Lab 09/13/11 1610 09/12/11 2006 09/12/11 1337  NA 135 136 135  K 4.4 4.3 5.1  CL 99 99 98  CO2 22 24 23   GLUCOSE 98 110* 106*  BUN 32* 29* 25*  CREATININE 3.90* 3.57* 3.29*  CALCIUM 8.9 8.9 9.1  ALB -- -- --  PHOS -- -- --  Liver Function Tests:  Lab 09/12/11 1337  AST 26  ALT 13  ALKPHOS 60  BILITOT 0.5  PROT 7.2  ALBUMIN 3.3*   No results found for this basename: LIPASE:3,AMYLASE:3 in the last 168 hours No results found for this basename: AMMONIA:3 in the last 168 hours INR: @resultsinr3 @ CBC:  Lab 09/13/11 0455 09/12/11 0500 09/11/11 0313 09/10/11 1447 09/10/11 0606  WBC 12.3* 14.1* 12.3* -- --    NEUTROABS -- -- -- -- 7.2  HGB 11.7* 12.6* 12.1* -- --  HCT 34.0* 36.0* 35.1* -- --  MCV 90.7 90.9 90.9 91.5 93.1  PLT 304 320 321 -- --   Blood Culture    Component Value Date/Time   SDES SPUTUM 09/11/2011 1836   SPECREQUEST NONE 09/11/2011 1836   CULT        BLOOD CULTURE RECEIVED NO GROWTH TO DATE CULTURE WILL BE HELD FOR 5 DAYS BEFORE ISSUING A FINAL NEGATIVE REPORT 09/10/2011 1535   REPTSTATUS 09/11/2011 FINAL 09/11/2011 1836    Cardiac Enzymes:  Lab 09/11/11 0214 09/10/11 2030 09/10/11 1447 09/10/11 0606  CKTOTAL 103 153 114 --  CKMB 3.9 4.2* 4.8* --  CKMBINDEX -- -- -- --  TROPONINI 0.95* 1.03* 1.33* 2.14*   CBG: No results found for this basename: GLUCAP:5 in the last 168 hours Iron Studies: No results found for this basename: IRON,TIBC,TRANSFERRIN,FERRITIN in the last 72 hours  Micro Results: Recent Results (from the past 240 hour(s))  CULTURE, BLOOD (ROUTINE X 2)     Status: Normal (Preliminary result)   Collection Time   09/10/11  3:34 PM      Component Value Range Status Comment   Specimen Description BLOOD RIGHT HAND   Final    Special Requests BOTTLES DRAWN AEROBIC AND ANAEROBIC 10CC   Final    Culture  Setup Time [id number]   Final    Culture     Final    Value:        BLOOD CULTURE RECEIVED NO GROWTH TO DATE CULTURE WILL BE HELD FOR 5 DAYS BEFORE ISSUING A FINAL NEGATIVE REPORT   Report Status PENDING   Incomplete   CULTURE, BLOOD (ROUTINE X 2)     Status: Normal (Preliminary result)   Collection Time   09/10/11  3:35 PM      Component Value Range Status Comment   Specimen Description BLOOD RIGHT ARM   Final    Special Requests BOTTLES DRAWN AEROBIC AND ANAEROBIC 10CC   Final    Culture  Setup Time [id number]   Final    Culture     Final    Value:        BLOOD CULTURE RECEIVED NO GROWTH TO DATE CULTURE WILL BE HELD FOR 5 DAYS BEFORE ISSUING A FINAL NEGATIVE REPORT   Report Status PENDING   Incomplete   CULTURE, SPUTUM-ASSESSMENT     Status: Normal    Collection Time   09/11/11  6:36 PM      Component Value Range Status Comment   Specimen Description SPUTUM   Final    Special Requests NONE   Final    Sputum evaluation     Final    Value: MICROSCOPIC FINDINGS SUGGEST THAT THIS SPECIMEN IS NOT REPRESENTATIVE OF LOWER RESPIRATORY SECRETIONS. PLEASE RECOLLECT.     CALLED TO RN E.SMITH AT 1923 09/11/11 BY L.PITT   Report Status 09/11/2011 FINAL   Final    Studies/Results: US Renal  09/12/2011  *RADIOLOGY REPORT*  Clinical Data:  Worsening renal  failure  RENAL/URINARY TRACT ULTRASOUND COMPLETE  Comparison:  None.  Findings:  Right Kidney:  12.3 cm.  Normal in size and parenchymal echogenicity.  No evidence of mass or hydronephrosis.  Left Kidney:  12.0 cm.  Normal in size and parenchymal echogenicity.  No evidence of mass or hydronephrosis.  Bladder:  Appears normal for degree of bladder distention.  IMPRESSION: No evidence for obstructive uropathy.  Original Report Authenticated By: Rosealee Albee, M.D.   Medications: Scheduled Meds:   . aspirin EC  325 mg Oral Daily  . atorvastatin  40 mg Oral q1800  . ipratropium  0.5 mg Nebulization TID  . levalbuterol  0.63 mg Nebulization TID  . levofloxacin (LEVAQUIN) IV  250 mg Intravenous Q24H  . metoprolol tartrate  25 mg Oral BID  . nicotine  21 mg Transdermal Daily  . pantoprazole  40 mg Oral Q1200  . sodium chloride  3 mL Intravenous Q12H  . DISCONTD: furosemide  40 mg Oral BID  . DISCONTD: ipratropium  0.5 mg Nebulization Q6H  . DISCONTD: levalbuterol  0.63 mg Nebulization Q6H  . DISCONTD: levofloxacin (LEVAQUIN) IV  750 mg Intravenous Q24H  . DISCONTD: piperacillin-tazobactam (ZOSYN)  IV  3.375 g Intravenous Q8H  . DISCONTD: potassium chloride  20 mEq Oral BID  . DISCONTD: vancomycin  1,000 mg Intravenous Q8H   Continuous Infusions:   . sodium chloride 10 mL/hr at 09/11/11 1445  . heparin 1,600 Units/hr (09/12/11 1949)   PRN Meds:.acetaminophen, acetaminophen, albuterol, alum & mag  hydroxide-simeth, cyclobenzaprine, docusate sodium, guaiFENesin-dextromethorphan, HYDROcodone-acetaminophen, HYDROmorphone, ondansetron (ZOFRAN) IV, ondansetron, DISCONTD: magnesium hydroxide   This is a Psychologist, occupational Note.  The care of the patient was discussed with Dr. Allena Katz and the assessment and plan formulated with their assistance.  Please see their attached note for official documentation of the daily encounter.  Morrell Riddle PA-S2 09/13/2011, 7:35 AM

## 2011-09-13 NOTE — Progress Notes (Signed)
Report from Night RN. Chart reviewed together. Handoff complete.  

## 2011-09-13 NOTE — Progress Notes (Signed)
I have seen and examined this patient and agree with the assessment/plan as outlined above by [facility] PA student. Agree with above A&P- etiology of ARF remains elusive. At this time- appears to be likely from an ischemic ATN but need to r/o AIN/GN Plan: 1. Serologies for GN (in spite of negative Urine sediment) 2. Screen for atheroembolism (In spite negative UA/CBC and exam) 3. DC levaquin and start Rocephin/Azithro 4. Consider renal biopsy tomorrow or Friday if serologies negative and renal function continues to worsen Noelene Gang K.,MD 09/13/2011 9:07 AM

## 2011-09-14 LAB — COMPREHENSIVE METABOLIC PANEL
ALT: 13 U/L (ref 0–53)
AST: 24 U/L (ref 0–37)
Albumin: 3.2 g/dL — ABNORMAL LOW (ref 3.5–5.2)
Alkaline Phosphatase: 61 U/L (ref 39–117)
CO2: 24 mEq/L (ref 19–32)
Chloride: 99 mEq/L (ref 96–112)
GFR calc non Af Amer: 14 mL/min — ABNORMAL LOW (ref >90)
Potassium: 4.7 mEq/L (ref 3.5–5.1)
Sodium: 137 mEq/L (ref 135–145)
Total Bilirubin: 0.3 mg/dL (ref 0.3–1.2)

## 2011-09-14 LAB — CBC
Platelets: 309 10*3/uL (ref 150–400)
RBC: 3.78 MIL/uL — ABNORMAL LOW (ref 4.22–5.81)
RDW: 14 % (ref 11.5–15.5)
WBC: 10.7 10*3/uL — ABNORMAL HIGH (ref 4.0–10.5)

## 2011-09-14 LAB — EXPECTORATED SPUTUM ASSESSMENT W GRAM STAIN, RFLX TO RESP C: Special Requests: NORMAL

## 2011-09-14 LAB — URINALYSIS, ROUTINE W REFLEX MICROSCOPIC
Glucose, UA: NEGATIVE mg/dL
Ketones, ur: 15 mg/dL — AB
Leukocytes, UA: NEGATIVE
Protein, ur: NEGATIVE mg/dL
Urobilinogen, UA: 0.2 mg/dL (ref 0.0–1.0)

## 2011-09-14 LAB — MPO/PR-3 (ANCA) ANTIBODIES: Myeloperoxidase Abs: <1 AU/mL (ref <20)

## 2011-09-14 LAB — C3 COMPLEMENT: C3 Complement: 190 mg/dL — ABNORMAL HIGH (ref 90–180)

## 2011-09-14 LAB — C4 COMPLEMENT: Complement C4, Body Fluid: 19 mg/dL (ref 10–40)

## 2011-09-14 LAB — HEPARIN LEVEL (UNFRACTIONATED): Heparin Unfractionated: 0.63 IU/mL (ref 0.30–0.70)

## 2011-09-14 MED ORDER — ENOXAPARIN SODIUM 30 MG/0.3ML ~~LOC~~ SOLN
30.0000 mg | SUBCUTANEOUS | Status: DC
  Administered 2011-09-14 – 2011-09-19 (×6): 30 mg via SUBCUTANEOUS
  Filled 2011-09-14 (×7): qty 0.3

## 2011-09-14 NOTE — Progress Notes (Signed)
PA Student Daily Progress Note [facility] Subjective: Pt states he is feeling better. He states his SOB has continued to improve. He still endorses a productive cough. He denies any weakness or fatigue. He denies any N/V.  Objective:  Filed Vitals:   09/13/11 2116 09/13/11 2247 09/13/11 2250 09/14/11 0350  BP:   123/80 97/65  Pulse:   102 93  Temp:  97.4 F (36.3 C)  97.6 F (36.4 C)  TempSrc:  Oral  Oral  Resp:    18  Height:      Weight:      SpO2: 95%  94% 95%   Physical Exam: General: Pleasant obese WM in NAD  HEENT: anicteric sclera, pupils equal and reactive to light and accomodation,  Neck: supple, no masses or lymphadenopathy, no goiter, left carotid endarterectomy scar  Lungs: bibasilar rales, Diffuse rhonchi that improves with cough, no wheezes  Cardiac: Rapid regular rhythm, normal S1 and S2, no murmur  Abdomen: Soft, nontender, no masses  Extremities: trace pedal edema noted,  Neuro: Alert and Oriented X 3, responds appropriately, equal strength in all extremities.  Skin: Warm, dry, no lesions or rashes noted   Assessment/Plan: 1. HCAP- CXR from yesterday shows widespread Pulm opacities without improvement from CXR done on 3-17, also shows small pleural effusions. Pt states improvement in sob however it is still present with minimal exertion. Continues to have productive cough with thick brown/green mucus. Resp cultures were obtained but thought no to be representative of Lower resp tract secretions.Pt now on Rocephin/Azithro. Continue abx therapy.  2. NSTEMI- Troponin positive x3. Cardiology following. Cardiology plans to proceed with cardiac catheterization this hospitalization once pneumonia adequately treated and kidney function stable. Pt denies any CP. 3. Acute Renal Failure- Etiology still unknown, possible ATN due to ischemia from volume depletion, relative hypotension and NSAID use. Scr continue to increase 4.21 today increased from 3.90  yesterday. BUN increased to 42 from 32 yesterday. Urine output from yesterday shows 1300 ml out. UA was negative for infection, glucose, HGB, protein. Renal US shows no evidence of obstruction. Total urine protein 6.6, Total urine creatinine 32.71, protein/creatinine ratio high at 0.20. No electrolyte abnormalities noted. No acute needs for HD. C3 was slightly high at 190. C4 was 19. ANCA, ANA, GBM antibodies pending. Pt may require renal biopsy in the near future.  4. CHF- Echo shows EF 40-45 %, CXR shows underlying vascular congestion and small left pleural effusion noted. Pt was receiving lasix 40mg  q12 but it was discontinued due to concerns over renal function. Slight increase in LE edema today.  5. Severe MR- Echo suggestive of severe MR, pt had recent echo done at [facility]. Cardiology is requesting those records to see if this is a new finding.  6. HLD- pt taken off atorvastatin due to possible worsening of renal failure associated with its use.  7. Hyperkalemia- lmproved. Continue to monitor.  8. Tobacco abuse- on nicotine patch  9. Mild anemia- Last HGB 12.1, continue to monitor  10. 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 Carotid artery disease- s/p carotid endardectomy  Labs: Basic Metabolic Panel:  Lab 09/14/11 5621 09/13/11 0455 09/12/11 2006  NA 137 135 136  K 4.7 4.4 4.3  CL 99 99 99  CO2 24 22 24   GLUCOSE 91 98 110*  BUN 42* 32* 29*  CREATININE 4.21* 3.90* 3.57*  CALCIUM 9.2 8.9 8.9  ALB -- -- --  PHOS -- -- --   Liver  Function Tests:  Lab 09/14/11 0545 09/12/11 1337  AST 24 26  ALT 13 13  ALKPHOS 61 60  BILITOT 0.3 0.5  PROT 7.0 7.2  ALBUMIN 3.2* 3.3*   No results found for this basename: LIPASE:3,AMYLASE:3 in the last 168 hours No results found for this basename: AMMONIA:3 in the last 168 hours INR: @resultsinr3 @ CBC:  Lab 09/14/11 0545 09/13/11 0915 09/13/11 0455 09/12/11 0500 09/11/11 0313 09/10/11 1447 09/10/11 0606  WBC 10.7* --  12.3* 14.1* -- -- --  NEUTROABS -- 7.4 -- -- -- -- 7.2  HGB 12.1* -- 11.7* 12.6* -- -- --  HCT 35.3* -- 34.0* 36.0* -- -- --  MCV 93.4 -- 90.7 90.9 90.9 91.5 --  PLT 309 -- 304 320 -- -- --   Blood Culture    Component Value Date/Time   SDES SPUTUM 09/14/2011 0138   SPECREQUEST NONE 09/14/2011 0138   CULT        BLOOD CULTURE RECEIVED NO GROWTH TO DATE CULTURE WILL BE HELD FOR 5 DAYS BEFORE ISSUING A FINAL NEGATIVE REPORT 09/10/2011 1535   REPTSTATUS 09/14/2011 FINAL 09/14/2011 0138    Cardiac Enzymes:  Lab 09/11/11 0214 09/10/11 2030 09/10/11 1447 09/10/11 0606  CKTOTAL 103 153 114 --  CKMB 3.9 4.2* 4.8* --  CKMBINDEX -- -- -- --  TROPONINI 0.95* 1.03* 1.33* 2.14*   CBG: No results found for this basename: GLUCAP:5 in the last 168 hours Iron Studies: No results found for this basename: IRON,TIBC,TRANSFERRIN,FERRITIN in the last 72 hours  Micro Results: Recent Results (from the past 240 hour(s))  CULTURE, BLOOD (ROUTINE X 2)     Status: Normal (Preliminary result)   Collection Time   09/10/11  3:34 PM      Component Value Range Status Comment   Specimen Description BLOOD RIGHT HAND   Final    Special Requests BOTTLES DRAWN AEROBIC AND ANAEROBIC 10CC   Final    Culture  Setup Time [id number]   Final    Culture     Final    Value:        BLOOD CULTURE RECEIVED NO GROWTH TO DATE CULTURE WILL BE HELD FOR 5 DAYS BEFORE ISSUING A FINAL NEGATIVE REPORT   Report Status PENDING   Incomplete   CULTURE, BLOOD (ROUTINE X 2)     Status: Normal (Preliminary result)   Collection Time   09/10/11  3:35 PM      Component Value Range Status Comment   Specimen Description BLOOD RIGHT ARM   Final    Special Requests BOTTLES DRAWN AEROBIC AND ANAEROBIC 10CC   Final    Culture  Setup Time [id number]   Final    Culture     Final    Value:        BLOOD CULTURE RECEIVED NO GROWTH TO DATE CULTURE WILL BE HELD FOR 5 DAYS BEFORE ISSUING A FINAL NEGATIVE REPORT   Report Status PENDING   Incomplete    CULTURE, SPUTUM-ASSESSMENT     Status: Normal   Collection Time   09/11/11  6:36 PM      Component Value Range Status Comment   Specimen Description SPUTUM   Final    Special Requests NONE   Final    Sputum evaluation     Final    Value: MICROSCOPIC FINDINGS SUGGEST THAT THIS SPECIMEN IS NOT REPRESENTATIVE OF LOWER RESPIRATORY SECRETIONS. PLEASE RECOLLECT.     CALLED TO RN E.SMITH AT 1923 09/11/11 BY L.PITT   Report Status  09/11/2011 FINAL   Final   CULTURE, SPUTUM-ASSESSMENT     Status: Normal   Collection Time   09/14/11  1:38 AM      Component Value Range Status Comment   Specimen Description SPUTUM   Final    Special Requests NONE   Final    Sputum evaluation     Final    Value: MICROSCOPIC FINDINGS SUGGEST THAT THIS SPECIMEN IS NOT REPRESENTATIVE OF LOWER RESPIRATORY SECRETIONS. PLEASE RECOLLECT.     CALLED TO A.BOEHLING,RN 0630 09/14/11 M.CAMPBELL   Report Status 09/14/2011 FINAL   Final    Studies/Results: Dg Chest 2 View  09/13/2011  *RADIOLOGY REPORT*  Clinical Data: 56 year old male with cough and shortness of breath.  CHEST - 2 VIEW  Comparison: 09/10/2011 and earlier.  Findings: Widespread nodular airspace opacity re-identified.  Most confluent areas are in the right upper lobe and at both lung bases. No significant improvement since 09/10/2011.  Stable cardiac size and mediastinal contours.  Visualized tracheal air column is within normal limits.  Small pleural effusions.  No pneumothorax.  IMPRESSION: Widespread pulmonary opacity compatible with bilateral pneumonia without improvement since 09/10/2011.  Small pleural effusions.  Original Report Authenticated By: Randall An, M.D.   US Renal  09/12/2011  *RADIOLOGY REPORT*  Clinical Data:  Worsening renal failure  RENAL/URINARY TRACT ULTRASOUND COMPLETE  Comparison:  None.  Findings:  Right Kidney:  12.3 cm.  Normal in size and parenchymal echogenicity.  No evidence of mass or hydronephrosis.  Left Kidney:  12.0 cm.  Normal  in size and parenchymal echogenicity.  No evidence of mass or hydronephrosis.  Bladder:  Appears normal for degree of bladder distention.  IMPRESSION: No evidence for obstructive uropathy.  Original Report Authenticated By: Angelita Ingles, M.D.   Medications: Scheduled Meds:   . aspirin EC  325 mg Oral Daily  . azithromycin  500 mg Intravenous Q24H  . cefTRIAXone (ROCEPHIN)  IV  2 g Intravenous Q24H  . ipratropium  0.5 mg Nebulization TID  . levalbuterol  0.63 mg Nebulization TID  . metoprolol tartrate  25 mg Oral BID  . nicotine  21 mg Transdermal Daily  . pantoprazole  40 mg Oral Q1200  . sodium chloride  3 mL Intravenous Q12H  . DISCONTD: atorvastatin  40 mg Oral q1800  . DISCONTD: levofloxacin (LEVAQUIN) IV  250 mg Intravenous Q24H  . DISCONTD: levofloxacin (LEVAQUIN) IV  750 mg Intravenous Q48H   Continuous Infusions:   . sodium chloride 10 mL/hr at 09/11/11 1445  . heparin 1,600 Units/hr (09/14/11 0523)   PRN Meds:.acetaminophen, acetaminophen, albuterol, alum & mag hydroxide-simeth, cyclobenzaprine, docusate sodium, guaiFENesin-dextromethorphan, HYDROcodone-acetaminophen, HYDROmorphone, ondansetron (ZOFRAN) IV, ondansetron   This is a Careers information officer Note.  The care of the patient was discussed with Dr. Posey Pronto and the assessment and plan formulated with their assistance.  Please see their attached note for official documentation of the daily encounter.  Gabriel Rainwater PA-S2 09/14/2011, 7:27 AM

## 2011-09-14 NOTE — Progress Notes (Signed)
I have seen and examined this patient and agree with the assessment/plan as outlined above by [facility] PA student. Data so far not pointing to etiology of his ARF. Rate of creatinine rise slower today and labs/clinical status does not indicate need for HD  Plan: 1. Await repeat UA 2. If UA shows proteinuria or hematuria- plan for renal biopsy and empiric corticosteroids as we await results  Given negative work up so far- this appears likely ATN or AIN rather than GN  Achaia Garlock K.,MD 09/14/2011 8:48 AM

## 2011-09-14 NOTE — Progress Notes (Signed)
[facility] [place] TEAM 1 - Stepdown/ICU TEAM  Subjective: No significant complaints verbalized by the patient today. No chest pain, shortness of breath, or orthopnea.  Objective: Blood pressure 120/82, pulse 100, temperature 97.3 F (36.3 C), temperature source Oral, resp. rate 18, height 5\' 8"  (1.727 m), weight 92 kg (202 lb 13.2 oz), SpO2 94.00%.  Intake/Output from previous day: 03/20 0701 - 03/21 0700 In: 1460 [P.O.:920; I.V.:540] Out: 1300 [Urine:1300] Intake/Output this shift:    General appearance: alert, cooperative, appears stated age and no distress Resp: Has left basilar crackles that extend up to the left midfield, somewhat diminished on the right on 3 L nasal cannula oxygen with saturations 97% Cardio: regular rate and rhythm, S1, S2 normal, no murmur, click, rub or gallop, IV fluid at the open rate, IV heparin infusing GI: soft, non-tender; bowel sounds normal; no masses,  no organomegaly Extremities: extremities normal, atraumatic, no cyanosis or edema Neurologic: Grossly normal  Lab Results:  Basename 09/14/11 0545 09/13/11 0455  WBC 10.7* 12.3*  HGB 12.1* 11.7*  HCT 35.3* 34.0*  PLT 309 304   BMET  Basename 09/14/11 0545 09/13/11 0455  NA 137 135  K 4.7 4.4  CL 99 99  CO2 24 22  GLUCOSE 91 98  BUN 42* 32*  CREATININE 4.21* 3.90*  CALCIUM 9.2 8.9   Medications:  I have reviewed the patient's current medications.  Assessment/Plan:  Acute respiratory failure with hypoxia/ *HCAP (healthcare-associated pneumonia) *Stable from a respiratory standpoint but still requiring nasal cannula oxygen *Antibiotics narrowed 09/13/2011 with discontinuation of Zosyn and vancomycin in favor of Levaquin. Today because of progressive renal failure Levaquin was discontinued in favor of Rocephin and azithromycin. *Continue supportive care with oxygen, nebulizers, and pulmonary toileting  NSTEMI (non-ST elevated myocardial infarction)/Abnormal  EKG *Cardiology is following-initial EKG was sinus rhythm with ST depression inferior lateral leads *Peak troponin I was 2.14 at [facility] and current trend is downward. May have had acute ischemic event 24-48 hours prior to presentation therefore troponin may have been higher *Suspect etiology secondary to pneumonia process with concurrent hypoxemia *Cardiology plans to proceed with cardiac catheterization this hospitalization once pneumonia adequately treated otherwise stable from a respiratory standpoint. Also recent problems with acute renal failure are prohibitive to proceeding with immediate cardiac catheterization. *Cardiology considering discontinuing heparin since cardiac catheterization not imminent *ECHO shows severe hypokinesis of the inferior myocardium as well as moderately reduced systolic function. Patient had echo done at [facility] back in February and Dr. Donnie Aho with cardiology is attempting to obtain this result for comparison *Continue aspirin, metoprolol and IV heparin (pharmacy managing heparin) *Lipitor was discontinued due to concerns this may be contributing to patient's renal failure. Please note patient was on Pravachol prior to admission  Systolic heart failure, NYHA class II/severe mitral regurgitation *Not sure if this is a new finding secondary to acute coronary ischemia or progression of underlying systolic dysfunction. Echo from [facility] has been requested *Also found to have severe MR and again unclear if this is new or acute finding-either problem could contribute to acute decompensated heart failure *Lasix was discontinued due to acute renal failure issues  Acute renal failure secondary to ATN versus acute interstitial nephritis *Creatinine 0.85 in admission and has increased to above 3 as of 09/12/2011 and continues to progressively increase. Despite this continues with adequate urinary output *Appreciate renal team assistance-so far no clear cut etiology to acute  renal failure *Serological workup in process *Levaquin was discontinued 09/14/2011 per recommendations of nephrology  in favor of Rocephin and azithromycin instead *Consider renal biopsy on Thursday or Friday if serologies negative and renal function continues to rise today  Leukocytosis/Dehydration *Resolved *Initially felt to be dehydrated secondary to pneumonia but chest x-ray looks more consistent with pulmonary edema secondary to acute systolic heart failure *Suspect leukocytosis could be related to acute coronary ischemia as well *Trend is downward and no left shift  Weakness generalized *Likely secondary to healthcare acquired pneumonia and acute coronary ischemia  S/P carotid endarterectomy *Stable without any neurological deficits  Hyperlipemia *On statin therapy with Pravachol prior to admission-because of renal failure statin therapy was discontinued on 09/14/2011 *Lipid panel this admission demonstrates suboptimal HDL cholesterol 20 and mildly elevated LDL cholesterol 104   Chronic back pain *Continue home Flexeril and Vicodin  Tobacco abuse *Nicotine patch in place *Patient counseled on beginning cessation given multiple vascular problems  Disposition *Remain in step down unit   LOS: 4 days   Junious Silk, ANP pager [phone]  [facility] [email or web address] Password: [email or web address]  09/14/2011, 11:33 AM   I have examined the patient and reviewed the chart. I agree with the above note.   Calvert Cantor, MD [phone]

## 2011-09-14 NOTE — Progress Notes (Signed)
Utilization review completed.  

## 2011-09-14 NOTE — Progress Notes (Signed)
Subjective:  Less cough. MIld SOB when went to bathroom, but overall feels a lot better.  Renal failure is worsening, but rate of rise is slower.  No chest pain.   Objective:  Vital Signs in the last 24 hours: BP 120/82  Pulse 100  Temp(Src) 97.3 F (36.3 C) (Oral)  Resp 18  Ht 5\' 8"  (1.727 m)  Wt 92 kg (202 lb 13.2 oz)  BMI 30.84 kg/m2  SpO2 94%  Physical Exam: Pleasant obese WM in NAD Lungs: Rales post lung L>R  Cardiac:  Rapid regular rhythm, normal S1 and S2, ? S4, again, no murmur heard today. Abdomen:  Soft, nontender, no masses Extremities:  No edema present  Intake/Output from previous day: 03/20 0701 - 03/21 0700 In: 1460 [P.O.:920; I.V.:540] Out: 1300 [Urine:1300]  Lab Results: Basic Metabolic Panel:  Basename 09/14/11 0545 09/13/11 0455  NA 137 135  K 4.7 4.4  CL 99 99  CO2 24 22  GLUCOSE 91 98  BUN 42* 32*  CREATININE 4.21* 3.90*   CBC:  Basename 09/14/11 0545 09/13/11 0915 09/13/11 0455  WBC 10.7* -- 12.3*  NEUTROABS -- 7.4 --  HGB 12.1* -- 11.7*  HCT 35.3* -- 34.0*  MCV 93.4 -- 90.7  PLT 309 -- 304   Telemetry: Sinus tachycardia  Assessment/Plan:  1. Acute renal failure - nephrology currently seeing.  Rate of rise is slowing but still worsening 2. Abnormal ECHO with severe MR ( again did not appreciate murmur at bedside) 3. Mild CHF with EF 40-45% 4. Abnormal EKG c/w ischemia 5.  Pneumonia under treatment  CXR is worsening today 6. Persistent sinus tachycardia 7.  Clinically he looks better although numbers are worse and Xray worse  Rec:  1. Recheck BNP 2. Consider stopping heparin.  No plans for cath at present 3. If hypoxic or worsening dyspnea consider pulmonary consult ? If he has something else going on unusual like Wegener's 4.  Have still not received [facility] records   W. Ashley Royalty  MD [facility] Cardiology  09/14/2011, 9:54 AM

## 2011-09-15 LAB — COMPREHENSIVE METABOLIC PANEL
ALT: 16 U/L (ref 0–53)
AST: 24 U/L (ref 0–37)
CO2: 21 mEq/L (ref 19–32)
Calcium: 9.2 mg/dL (ref 8.4–10.5)
GFR calc non Af Amer: 15 mL/min — ABNORMAL LOW (ref >90)
Potassium: 4.3 mEq/L (ref 3.5–5.1)
Sodium: 137 mEq/L (ref 135–145)

## 2011-09-15 MED ORDER — METOPROLOL TARTRATE 50 MG PO TABS
50.0000 mg | ORAL_TABLET | Freq: Two times a day (BID) | ORAL | Status: DC
  Administered 2011-09-15 – 2011-09-16 (×3): 50 mg via ORAL
  Filled 2011-09-15 (×4): qty 1

## 2011-09-15 MED ORDER — IPRATROPIUM BROMIDE 0.02 % IN SOLN: 0.5000 mg | Freq: Four times a day (QID) | RESPIRATORY_TRACT | Status: DC | PRN

## 2011-09-15 MED ORDER — LEVALBUTEROL HCL 0.63 MG/3ML IN NEBU
0.6300 mg | INHALATION_SOLUTION | Freq: Four times a day (QID) | RESPIRATORY_TRACT | Status: DC | PRN
  Filled 2011-09-15: qty 3

## 2011-09-15 NOTE — Progress Notes (Signed)
Pt. c/o acute onset nausea w/small amount emesis.  SBP decreased to 88, skin warm, dry, and pale. No rhythm or ST changes on monitor.  Pt. denies other symptom. Pt was assisted back to bed and zofran given for nausea.  Pt.'s color and nausea subsequently improved. (Of note, increased metoprolol dose given this am) Will continue to monitor for any change in condition.

## 2011-09-15 NOTE — Progress Notes (Signed)
I have seen and examined this patient and agree with the assessment/plan as outlined above by [facility] PA student. I personally examined the patient and addended/edited the above note as needed. Renal function appears to now be stable- ?plateau phase of ATN? Serologies negative for RPGN and repeat UA bland- no indications for renal biopsy at this time- will continue to follwo Marri Mcneff K.,MD 09/15/2011 9:11 AM

## 2011-09-15 NOTE — Progress Notes (Signed)
Subjective:  Overall feels a lot better.  Still mild orthopnea. Renal failure has plateaued.  No chest pain.   Objective:  Vital Signs in the last 24 hours: BP 127/81  Pulse 99  Temp(Src) 97.3 F (36.3 C) (Oral)  Resp 16  Ht 5\' 8"  (1.727 m)  Wt 92 kg (202 lb 13.2 oz)  BMI 30.84 kg/m2  SpO2 97%  Physical Exam: Pleasant obese WM in NAD Lungs: Rales post lung L>R  Cardiac:  Rapid regular rhythm, normal S1 and S2, ? S4, again, no murmur heard today. Extremities:  No edema present  Intake/Output from previous day: 03/21 0701 - 03/22 0700 In: 1398 [P.O.:1200; I.V.:198] Out: 1000 [Urine:1000]  Lab Results: Basic Metabolic Panel:  Basename 09/15/11 0548 09/14/11 0545  NA 137 137  K 4.3 4.7  CL 101 99  CO2 21 24  GLUCOSE 97 91  BUN 49* 42*  CREATININE 4.16* 4.21*   CBC:  Basename 09/14/11 0545 09/13/11 0915 09/13/11 0455  WBC 10.7* -- 12.3*  NEUTROABS -- 7.4 --  HGB 12.1* -- 11.7*  HCT 35.3* -- 34.0*  MCV 93.4 -- 90.7  PLT 309 -- 304   Telemetry: Sinus tachycardia  Assessment/Plan:  1. Acute renal failure - nephrology currently seeing.  Currently plateaued 2. Abnormal ECHO [facility] ECHO results reviewed.  EF was 40-45% there. Also eccentric MR noted on their ECHO 3. Mild CHF with EF 40-45% 4. Abnormal EKG c/w ischemia 5.  Pneumonia under treatment  6. Persistent sinus tachycardia   Rec:  1. Recheck BNP 2. Clinically a lot better, but still with ARF.  I would defer cardiac cath until complete resolution of ATN.  Since has persistent sinus tach will increase beta blocker.  Not candidate for ACE or ARB at present secondary to reanal failure.  3. Once more stable might consider addition of Plavix to regimen.  Darden Palmer  MD [facility] Cardiology  09/15/2011, 8:45 AM

## 2011-09-15 NOTE — Progress Notes (Signed)
PA Student Daily Progress Note [facility] Subjective: Pt states he is feeling okay. He states he still becomes sob with minimal exertion. No CP. No weakness or fatigue. No N/V. He states a decrease in appetite but attributes it to the medications he is taking making "everything taste funny." He states a similar thing happened to him when he was hospitalized for his stroke. He denies any urinary symptoms.  Objective:  Filed Vitals:   09/15/11 0000 09/15/11 0011 09/15/11 0400 09/15/11 0407  BP: 98/56  101/70   Pulse: 87  89   Temp:  97.6 F (36.4 C)  97.6 F (36.4 C)  TempSrc:    Oral  Resp: 20  16   Height:      Weight:      SpO2: 93%  94%    Physical Exam: General: Pleasant obese WM in NAD  HEENT: anicteric sclera, pupils equal and reactive to light and accomodation,  Neck: supple, no masses or lymphadenopathy, no goiter, left carotid endarterectomy scar  Lungs: bibasilar rales,  no wheezes or rhonchi  Cardiac: Rapid regular rhythm, normal S1 and S2, no murmur  Abdomen: Soft, nontender, no masses  Extremities: trace pedal edema noted,  Neuro: Alert and Oriented X 3, responds appropriately, equal strength in all extremities.  Skin: Warm, dry, no lesions or rashes noted   Assessment/Plan: 1. HCAP- CXR from 3-20 shows widespread Pulm opacities without improvement from CXR done on 3-17, also shows small pleural effusions. No repeat CXR available since that time. Pt states improvement in sob however it is still present with minimal exertion. Continues to have productive cough with thick brown/green mucus. Resp cultures were obtained but thought no to be representative of Lower resp tract secretions. Repeat cultures have been obtained and are not yet available. Pt now on Rocephin/Azithro. Continue abx therapy.  2. NSTEMI- Troponin positive x3. Cardiology following. Cardiology plans to proceed with cardiac catheterization this hospitalization once pneumonia adequately treated  and kidney function stable. Pt denies any CP.  3. Acute Renal Failure- Shows signs of improvement. Etiology still unknown, possible ATN due to ischemia from volume depletion, relative hypotension and NSAID use. Scr decreased today to 4.16 from 4.21 yesterday. Urine output from yesterday shows 1000 ml out. .C3 was slightly high at 190. C4 was 19. ANCA shows no abnormalities. ANA negative. GBM antibodies <1. Repeat UA shows no signs of infection, no glucose or HGB. Was positive for ketones. No electrolyte abnormalities noted. No acute needs for HD.  Hopefully kidney function will continue to improve. Monitor closely.  4. CHF- Echo shows EF 40-45 %, CXR shows underlying vascular congestion and small left pleural effusion noted. Pt was receiving lasix 40mg  q12 but it was discontinued due to concerns over renal function. Slight increase in LE edema today.  5. Severe MR- Echo suggestive of severe MR, pt had recent echo done at [facility]. Cardiology is requesting those records to see if this is a new finding.  6. HLD- pt taken off atorvastatin due to possible worsening of renal failure associated with its use.  7. Hyperkalemia- lmproved. Continue to monitor.  8. Tobacco abuse- on nicotine patch  9. Mild anemia- Last HGB 12.1, continue to monitor  10. 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 Carotid artery disease- s/p carotid endardectomy  Labs: Basic Metabolic Panel:  Lab 52/77/82 0548 09/14/11 0545 09/13/11 0455  NA 137 137 135  K 4.3 4.7 4.4  CL 101 99 99  CO2 21  24 22  GLUCOSE 97 91 98  BUN 49* 42* 32*  CREATININE 4.16* 4.21* 3.90*  CALCIUM 9.2 9.2 8.9  ALB -- -- --  PHOS -- -- --   Liver Function Tests:  Lab 09/15/11 0548 09/14/11 0545 09/12/11 1337  AST 24 24 26   ALT 16 13 13   ALKPHOS 62 61 60  BILITOT 0.2* 0.3 0.5  PROT 6.9 7.0 7.2  ALBUMIN 3.0* 3.2* 3.3*   No results found for this basename: LIPASE:3,AMYLASE:3 in the last 168 hours No results found for this  basename: AMMONIA:3 in the last 168 hours INR: @resultsinr3 @ CBC:  Lab 09/14/11 0545 09/13/11 0915 09/13/11 0455 09/12/11 0500 09/11/11 0313 09/10/11 1447 09/10/11 0606  WBC 10.7* -- 12.3* 14.1* -- -- --  NEUTROABS -- 7.4 -- -- -- -- 7.2  HGB 12.1* -- 11.7* 12.6* -- -- --  HCT 35.3* -- 34.0* 36.0* -- -- --  MCV 93.4 -- 90.7 90.9 90.9 91.5 --  PLT 309 -- 304 320 -- -- --   Blood Culture    Component Value Date/Time   SDES SPUTUM 09/14/2011 2124   SDES SPUTUM 09/14/2011 2124   SPECREQUEST Normal 09/14/2011 2124   SPECREQUEST NONE 09/14/2011 2124   CULT PENDING 09/14/2011 2124   REPTSTATUS 09/14/2011 FINAL 09/14/2011 2124   REPTSTATUS PENDING 09/14/2011 2124    Cardiac Enzymes:  Lab 09/11/11 0214 09/10/11 2030 09/10/11 1447 09/10/11 0606  CKTOTAL 103 153 114 --  CKMB 3.9 4.2* 4.8* --  CKMBINDEX -- -- -- --  TROPONINI 0.95* 1.03* 1.33* 2.14*   CBG: No results found for this basename: GLUCAP:5 in the last 168 hours Iron Studies: No results found for this basename: IRON,TIBC,TRANSFERRIN,FERRITIN in the last 72 hours  Micro Results: Recent Results (from the past 240 hour(s))  CULTURE, BLOOD (ROUTINE X 2)     Status: Normal (Preliminary result)   Collection Time   09/10/11  3:34 PM      Component Value Range Status Comment   Specimen Description BLOOD RIGHT HAND   Final    Special Requests BOTTLES DRAWN AEROBIC AND ANAEROBIC 10CC   Final    Culture  Setup Time [id number]   Final    Culture     Final    Value:        BLOOD CULTURE RECEIVED NO GROWTH TO DATE CULTURE WILL BE HELD FOR 5 DAYS BEFORE ISSUING A FINAL NEGATIVE REPORT   Report Status PENDING   Incomplete   CULTURE, BLOOD (ROUTINE X 2)     Status: Normal (Preliminary result)   Collection Time   09/10/11  3:35 PM      Component Value Range Status Comment   Specimen Description BLOOD RIGHT ARM   Final    Special Requests BOTTLES DRAWN AEROBIC AND ANAEROBIC 10CC   Final    Culture  Setup Time [id number]   Final     Culture     Final    Value:        BLOOD CULTURE RECEIVED NO GROWTH TO DATE CULTURE WILL BE HELD FOR 5 DAYS BEFORE ISSUING A FINAL NEGATIVE REPORT   Report Status PENDING   Incomplete   CULTURE, SPUTUM-ASSESSMENT     Status: Normal   Collection Time   09/11/11  6:36 PM      Component Value Range Status Comment   Specimen Description SPUTUM   Final    Special Requests NONE   Final    Sputum evaluation     Final  Value: MICROSCOPIC FINDINGS SUGGEST THAT THIS SPECIMEN IS NOT REPRESENTATIVE OF LOWER RESPIRATORY SECRETIONS. PLEASE RECOLLECT.     CALLED TO RN E.SMITH AT 1923 09/11/11 BY L.PITT   Report Status 09/11/2011 FINAL   Final   CULTURE, SPUTUM-ASSESSMENT     Status: Normal   Collection Time   09/14/11  1:38 AM      Component Value Range Status Comment   Specimen Description SPUTUM   Final    Special Requests NONE   Final    Sputum evaluation     Final    Value: MICROSCOPIC FINDINGS SUGGEST THAT THIS SPECIMEN IS NOT REPRESENTATIVE OF LOWER RESPIRATORY SECRETIONS. PLEASE RECOLLECT.     CALLED TO A.BOEHLING,RN [id number] 09/14/11 M.CAMPBELL   Report Status 09/14/2011 FINAL   Final   CULTURE, SPUTUM-ASSESSMENT     Status: Normal   Collection Time   09/14/11  9:24 PM      Component Value Range Status Comment   Specimen Description SPUTUM   Final    Special Requests Normal   Final    Sputum evaluation     Final    Value: THIS SPECIMEN IS ACCEPTABLE. RESPIRATORY CULTURE REPORT TO FOLLOW.   Report Status 09/14/2011 FINAL   Final   CULTURE, RESPIRATORY     Status: Normal (Preliminary result)   Collection Time   09/14/11  9:24 PM      Component Value Range Status Comment   Specimen Description SPUTUM   Final    Special Requests NONE   Final    Gram Stain     Final    Value: RARE WBC PRESENT,BOTH PMN AND MONONUCLEAR     RARE SQUAMOUS EPITHELIAL CELLS PRESENT     NO ORGANISMS SEEN   Culture PENDING   Incomplete    Report Status PENDING   Incomplete    Studies/Results: Dg Chest 2  View  09/13/2011  *RADIOLOGY REPORT*  Clinical Data: 56 year old male with cough and shortness of breath.  CHEST - 2 VIEW  Comparison: 09/10/2011 and earlier.  Findings: Widespread nodular airspace opacity re-identified.  Most confluent areas are in the right upper lobe and at both lung bases. No significant improvement since 09/10/2011.  Stable cardiac size and mediastinal contours.  Visualized tracheal air column is within normal limits.  Small pleural effusions.  No pneumothorax.  IMPRESSION: Widespread pulmonary opacity compatible with bilateral pneumonia without improvement since 09/10/2011.  Small pleural effusions.  Original Report Authenticated By: Randall An, M.D.   Medications: Scheduled Meds:   . aspirin EC  325 mg Oral Daily  . azithromycin  500 mg Intravenous Q24H  . cefTRIAXone (ROCEPHIN)  IV  2 g Intravenous Q24H  . enoxaparin (LOVENOX) injection  30 mg Subcutaneous Q24H  . ipratropium  0.5 mg Nebulization TID  . levalbuterol  0.63 mg Nebulization TID  . metoprolol tartrate  25 mg Oral BID  . nicotine  21 mg Transdermal Daily  . pantoprazole  40 mg Oral Q1200  . sodium chloride  3 mL Intravenous Q12H   Continuous Infusions:   . sodium chloride 10 mL/hr at 09/11/11 1445  . DISCONTD: heparin 1,600 Units/hr (09/14/11 0523)   PRN Meds:.acetaminophen, acetaminophen, albuterol, alum & mag hydroxide-simeth, cyclobenzaprine, docusate sodium, guaiFENesin-dextromethorphan, HYDROcodone-acetaminophen, HYDROmorphone, ondansetron (ZOFRAN) IV, ondansetron   This is a Careers information officer Note.  The care of the patient was discussed with Dr. Posey Pronto and the assessment and plan formulated with their assistance.  Please see their attached note for official documentation of the daily  encounter.  Gabriel Rainwater PA-S2 09/15/2011, 7:25 AM

## 2011-09-15 NOTE — Progress Notes (Signed)
[facility] [place] TEAM 1 - Stepdown/ICU TEAM  Subjective: Clinically improved and stable. Denies shortness of breath or any chest pain. Coughing has also improved.  Objective: Blood pressure 127/81, pulse 99, temperature 97.3 F (36.3 C), temperature source Oral, resp. rate 16, height 5\' 8"  (1.727 m), weight 92 kg (202 lb 13.2 oz), SpO2 97.00%.  Intake/Output from previous day: 03/21 0701 - 03/22 0700 In: 1398 [P.O.:1200; I.V.:198] Out: 1000 [Urine:1000] Intake/Output this shift: Total I/O In: 260 [P.O.:240; I.V.:20] Out: -   General appearance: alert, cooperative, appears stated age and no distress Resp: Has left basilar crackles that extend up to the left midfield, somewhat diminished on the right on 3 L nasal cannula oxygen with saturations 97% Cardio: regular rate and rhythm, S1, S2 normal, no murmur, click, rub or gallop, IV fluid at the open rate, IV heparin infusing GI: soft, non-tender; bowel sounds normal; no masses,  no organomegaly Extremities: extremities normal, atraumatic, no cyanosis or edema Neurologic: Grossly normal  Lab Results:  Basename 09/14/11 0545 09/13/11 0455  WBC 10.7* 12.3*  HGB 12.1* 11.7*  HCT 35.3* 34.0*  PLT 309 304   BMET  Basename 09/15/11 0548 09/14/11 0545  NA 137 137  K 4.3 4.7  CL 101 99  CO2 21 24  GLUCOSE 97 91  BUN 49* 42*  CREATININE 4.16* 4.21*  CALCIUM 9.2 9.2   Medications:  I have reviewed the patient's current medications.  Assessment/Plan:  Acute respiratory failure with hypoxia/ *HCAP (healthcare-associated pneumonia) *Stable from a respiratory standpoint but still requiring nasal cannula oxygen *Antibiotics narrowed 09/13/2011 with discontinuation of Zosyn and vancomycin in favor of Levaquin. Today because of progressive renal failure Levaquin was discontinued in favor of Rocephin and azithromycin. *Continue supportive care with oxygen, nebulizers, and pulmonary toileting  NSTEMI (non-ST elevated  myocardial infarction)/Abnormal EKG *Cardiology is following-initial EKG was sinus rhythm with ST depression inferior lateral leads *Peak troponin I was 2.14 at [facility] and current trend is downward. May have had acute ischemic event 24-48 hours prior to presentation therefore troponin may have been higher *Suspect etiology secondary to pneumonia process with concurrent hypoxemia *Cardiology has deferred cardiac catheterization until complete resolution of HTN. *Do to persistent tachycardia beta blocker dosage was increased. He is not a candidate for ACE or ARB due to renal failure *ECHO shows severe hypokinesis of the inferior myocardium as well as moderately reduced systolic function. Had echocardiogram in February 2013 at [facility] which also demonstrated 40-45% EF and eccentric MR. *Continue aspirin and metoprolol. IV heparin was discontinued on 09/14/2011 per cardiology. Once more stable cardiology is considering adding Plavix to the regimen *Lipitor was discontinued due to concerns this may be contributing to patient's renal failure. Please note patient was on Pravachol prior to admission  Systolic heart failure, NYHA class II/severe mitral regurgitation *Similar findings on February echocardiogram from [facility] including eccentric MR which has now progressed to severe on echo here *Lasix was discontinued due to acute renal failure issues  Acute renal failure secondary to ATN versus acute interstitial nephritis *Creatinine 0.85 at admission and subsequently peaked at 4.21 on 09/14/2011 and as of today trend is downward *Appreciate renal team assistance. They suspect ATN as etiology. Serologies are negative for RPGN and followup urinalysis bland. No indications for renal biopsy at this time. *Levaquin was discontinued 09/14/2011 per recommendations of nephrology in favor of Rocephin and azithromycin instead  Leukocytosis/Dehydration *Resolved *Initially felt to be  dehydrated secondary to pneumonia but chest x-ray looks more  consistent with pulmonary edema secondary to acute systolic heart failure *Suspect leukocytosis could be related to acute coronary ischemia as well *Trend is downward and no left shift  Weakness generalized *Likely secondary to healthcare acquired pneumonia and acute coronary ischemia  S/P carotid endarterectomy *Stable without any neurological deficits  Hyperlipemia *On statin therapy with Pravachol prior to admission-because of renal failure statin therapy was discontinued on 09/14/2011 *Lipid panel this admission demonstrates suboptimal HDL cholesterol 20 and mildly elevated LDL cholesterol 104   Chronic back pain *Continue home Flexeril and Vicodin  Tobacco abuse *Nicotine patch in place *Patient counseled on beginning cessation given multiple vascular problems  Disposition *Transfer to telemetry unit   LOS: 5 days   Junious Silk, ANP pager [phone]  [facility] [email or web address] Password: [email or web address]  09/15/2011, 11:34 AM  I have personally examined this patient and reviewed the entire database. I have reviewed the above note, made any necessary editorial changes, and agree with its content.  Lonia Blood, MD [facility]

## 2011-09-16 DIAGNOSIS — J189 Pneumonia, unspecified organism: Secondary | ICD-10-CM

## 2011-09-16 LAB — CULTURE, BLOOD (ROUTINE X 2)
Culture  Setup Time: 201303172116
Culture  Setup Time: 201303172116

## 2011-09-16 LAB — BASIC METABOLIC PANEL
GFR calc Af Amer: 18 mL/min — ABNORMAL LOW (ref >90)
GFR calc non Af Amer: 15 mL/min — ABNORMAL LOW (ref >90)
Potassium: 4.8 mEq/L (ref 3.5–5.1)
Sodium: 135 mEq/L (ref 135–145)

## 2011-09-16 LAB — CBC
Hemoglobin: 12.1 g/dL — ABNORMAL LOW (ref 13.0–17.0)
RBC: 3.83 MIL/uL — ABNORMAL LOW (ref 4.22–5.81)

## 2011-09-16 MED ORDER — METOPROLOL TARTRATE 50 MG PO TABS
50.0000 mg | ORAL_TABLET | Freq: Two times a day (BID) | ORAL | Status: DC
  Administered 2011-09-18 – 2011-09-20 (×4): 50 mg via ORAL
  Filled 2011-09-16 (×9): qty 1

## 2011-09-16 NOTE — Progress Notes (Signed)
Patient Name: Charles Savage      SUBJECTIVE: Patient admitted a week ago with chest pain and pneumonia and abnormal cardiac enzymes. Peak troponin was 2.14 at 80% hospital The hospital course is further complicated by renal failure for which no specific mechanism has been elucidated but thought to be HTN. Creatinine has plateaued in the low 4.0 range  echo demonstrated severe mitral regurgitation with ejection fraction 45-45% and severe inferior wall hypokinesis. Catheterization has been deferred because of renal insufficiency  It is worth noting perhaps that in February he had a stroke thought to related to his carotid artery. He has had an MI as suggested by his inferior wall motion abnormality and presumably(realizing I'm not very smart about this) secondary mitral regurgitation and and has had renal failure. I wonder whether there is any possibility that these episodes are not all thromboembolic and isn't worth looking for a right to left shunt.   The patient denies chest pain, shortness of breath, nocturnal dyspnea, orthopnea or peripheral edema.  There have been no palpitations, lightheadedness or syncope.   Past Medical History  Diagnosis Date  . Chronic pain     back pain  . Stroke   . Hyperlipemia   . Lumbar disc disease   . Carotid artery disease     PHYSICAL EXAM Filed Vitals:   09/15/11 1700 09/15/11 1900 09/15/11 1946 09/15/11 2140  BP: 105/65 106/69  112/74  Pulse:    93  Temp:   98.2 F (36.8 C) 97.6 F (36.4 C)  TempSrc:   Oral   Resp:   21 18  Height:      Weight:      SpO2:   95% 96%    Well developed and nourished in no acute distress HENT normal Neck supple with JVP-flat Clear Regular rate and rhythm, no murmurs or gallops Abd-soft with active BS No Clubbing cyanosis edema Skin-warm and dry A & Oriented  Grossly normal sensory and motor function   TELEMETRY: Reviewed telemetry pt in  nsr    Intake/Output Summary (Last 24 hours) at 09/16/11  1040 Last data filed at 09/16/11 0900  Gross per 24 hour  Intake   1082 ml  Output    850 ml  Net    232 ml    LABS: Basic Metabolic Panel:  Lab 09/16/11 6578 09/15/11 0548 09/14/11 0545 09/13/11 0455 09/12/11 2006 09/12/11 1337 09/12/11 0846  NA 135 137 137 135 136 135 136  K 4.8 4.3 4.7 4.4 4.3 5.1 5.4*  CL 99 101 99 99 99 98 99  CO2 22 21 24 22 24 23 24   GLUCOSE 106* 97 91 98 110* 106* 126*  BUN 56* 49* 42* 32* 29* 25* 23  CREATININE 4.04* 4.16* 4.21* 3.90* 3.57* 3.29* 3.04*  CALCIUM 9.0 9.2 -- -- -- -- --  MG -- -- -- -- -- -- --  PHOS -- -- -- -- -- -- --   Cardiac Enzymes: No results found for this basename: CKTOTAL:3,CKMB:3,CKMBINDEX:3,TROPONINI:3 in the last 72 hours CBC:  Lab 09/16/11 0500 09/14/11 0545 09/13/11 0915 09/13/11 0455 09/12/11 0500 09/11/11 0313 09/10/11 1447 09/10/11 0606  WBC 10.5 10.7* -- 12.3* 14.1* 12.3* 11.0* 11.8*  NEUTROABS -- -- 7.4 -- -- -- -- 7.2  HGB 12.1* 12.1* -- 11.7* 12.6* 12.1* 13.4 13.7  HCT 35.5* 35.3* -- 34.0* 36.0* 35.1* 39.0 40.5  MCV 92.7 93.4 -- 90.7 90.9 90.9 91.5 93.1  PLT 329 309 -- 304 320 321 345 358  PROTIME: No results found for this basename: LABPROT:3,INR:3 in the last 72 hours Liver Function Tests:  Basename 09/15/11 0548 09/14/11 0545  AST 24 24  ALT 16 13  ALKPHOS 62 61  BILITOT 0.2* 0.3  PROT 6.9 7.0  ALBUMIN 3.0* 3.2*     ASSESSMENT AND PLAn for now continued medical therapy is in order As noted above, I wonder if there is a unifying diagnosis for his stroke, presumed MI, and renal insufficiency. As a possible if there is a right to left shunt. I will defer this to Dr. Donnie Aho.     Signed, Sherryl Manges MD  09/16/2011

## 2011-09-16 NOTE — Progress Notes (Signed)
Patient ID: Charles Savage, male   DOB: 04/01/1956, 56 y.o.   MRN: [id number]   [facility] Progress Note    Subjective:   Reports to be feeling well, had some transient nausea, hypertension and vomiting after metoprolol yesterday in the evening. States that this has been on recurrent and he feels well this morning.    Objective:   BP 112/74  Pulse 93  Temp(Src) 97.6 F (36.4 C) (Oral)  Resp 18  Ht 5\' 8"  (1.727 m)  Wt 92 kg (202 lb 13.2 oz)  BMI 30.84 kg/m2  SpO2 96%  Intake/Output Summary (Last 24 hours) at 09/16/11 0931 Last data filed at 09/16/11 0900  Gross per 24 hour  Intake   1832 ml  Output    850 ml  Net    982 ml   Weight change:   Physical Exam: Gen: Comfortably sleeping in bed, in no distress CVS: Pulse regular in rate and rhythm, heart sounds S1 and S2 normal Resp: Coarse rales bilaterally right greater than left Abd: Soft, obese, nontender and bowel sounds are normal Ext: Trace to 1+ edema bipedally  Imaging: No results found.  Labs: BMET  Lab 09/16/11 0500 09/15/11 0548 09/14/11 0545 09/13/11 0455 09/12/11 2006 09/12/11 1337 09/12/11 0846  NA 135 137 137 135 136 135 136  K 4.8 4.3 4.7 4.4 4.3 5.1 5.4*  CL 99 101 99 99 99 98 99  CO2 22 21 24 22 24 23 24   GLUCOSE 106* 97 91 98 110* 106* 126*  BUN 56* 49* 42* 32* 29* 25* 23  CREATININE 4.04* 4.16* 4.21* 3.90* 3.57* 3.29* 3.04*  ALB -- -- -- -- -- -- --  CALCIUM 9.0 9.2 9.2 8.9 8.9 9.1 8.9  PHOS -- -- -- -- -- -- --   CBC  Lab 09/16/11 0500 09/14/11 0545 09/13/11 0915 09/13/11 0455 09/12/11 0500 09/10/11 0606  WBC 10.5 10.7* -- 12.3* 14.1* --  NEUTROABS -- -- 7.4 -- -- 7.2  HGB 12.1* 12.1* -- 11.7* 12.6* --  HCT 35.5* 35.3* -- 34.0* 36.0* --  MCV 92.7 93.4 -- 90.7 90.9 --  PLT 329 309 -- 304 320 --    Medications:      . aspirin EC  325 mg Oral Daily  . azithromycin  500 mg Intravenous Q24H  . cefTRIAXone (ROCEPHIN)  IV  2 g Intravenous Q24H  . enoxaparin (LOVENOX)  injection  30 mg Subcutaneous Q24H  . metoprolol tartrate  50 mg Oral BID  . nicotine  21 mg Transdermal Daily  . pantoprazole  40 mg Oral Q1200  . sodium chloride  3 mL Intravenous Q12H     Assessment/ Plan:   1. CAP- improving clinically on azithromycin and Rocephin-afebrile and without features of impending sepsis.  2. NSTEMI- Troponin positive x3. Cardiology following and plans are noted for elective cardiac catheterization once the patient is recovered from renal failure/pneumonia in order to limit for the renal toxicity from intravenous contrast.  3. Acute Renal Failure- renal function continues to show improvement and recovery from what clinically now appears consistent with ATN. Serologies negative for glomerulonephritis and urinary sediment remains bland. No acute electrolyte issues recognized at this time and no indications for intravenous fluids. 4. CHF- Echo shows EF 40-45 %, CXR shows underlying vascular congestion and small left pleural effusion noted. Pt was receiving lasix 40mg  q12 but it was discontinued due to concerns over renal function. Slight increase in LE edema today.  5. Severe MR- Echo  suggestive of severe MR, pt had recent echo done at [facility]. Cardiology is requesting those records to see if this is a new finding.   Zetta Bills, MD 09/16/2011, 9:31 AM

## 2011-09-16 NOTE — Progress Notes (Signed)
Subjective: Patient seen and examined, stated that is not feeling well because his blood pressure is low  Objective: Vital signs in last 24 hours: Temp:  [97.5 F (36.4 C)-98.2 F (36.8 C)] 97.5 F (36.4 C) (03/23 1332) Pulse Rate:  [80-93] 80  (03/23 1600) Resp:  [18-21] 20  (03/23 1332) BP: (71-112)/(56-79) 104/72 mmHg (03/23 1600) SpO2:  [95 %-96 %] 96 % (03/23 1332) Weight change:  Last BM Date: 09/14/11  Intake/Output from previous day: 03/22 0701 - 03/23 0700 In: 1732 [P.O.:720; I.V.:110; IV Piggyback:902] Out: 850 [Urine:850] Total I/O In: 363 [P.O.:360; I.V.:3] Out: 800 [Urine:800]   Physical Exam: General: Alert, awake, oriented x3, in no acute distress. Heart: Regular rate and rhythm, without murmurs, rubs, gallops. Lungs: Bibasilar Rales Abdomen: Soft, nontender, nondistended, positive bowel sounds. Extremities: Trace pedal edema with positive pedal pulses. Neuro: Grossly intact, nonfocal.    Lab Results: Results for orders placed during the hospital encounter of 09/10/11 (from the past 24 hour(s))  BASIC METABOLIC PANEL     Status: Abnormal   Collection Time   09/16/11  5:00 AM      Component Value Range   Sodium 135  135 - 145 (mEq/L)   Potassium 4.8  3.5 - 5.1 (mEq/L)   Chloride 99  96 - 112 (mEq/L)   CO2 22  19 - 32 (mEq/L)   Glucose, Bld 106 (*) 70 - 99 (mg/dL)   BUN 56 (*) 6 - 23 (mg/dL)   Creatinine, Ser 1.61 (*) 0.50 - 1.35 (mg/dL)   Calcium 9.0  8.4 - 09.6 (mg/dL)   GFR calc non Af Amer 15 (*) >90 (mL/min)   GFR calc Af Amer 18 (*) >90 (mL/min)  CBC     Status: Abnormal   Collection Time   09/16/11  5:00 AM      Component Value Range   WBC 10.5  4.0 - 10.5 (K/uL)   RBC 3.83 (*) 4.22 - 5.81 (MIL/uL)   Hemoglobin 12.1 (*) 13.0 - 17.0 (g/dL)   HCT 04.5 (*) 40.9 - 52.0 (%)   MCV 92.7  78.0 - 100.0 (fL)   MCH 31.6  26.0 - 34.0 (pg)   MCHC 34.1  30.0 - 36.0 (g/dL)   RDW 81.1  91.4 - 78.2 (%)   Platelets 329  150 - 400 (K/uL)     Studies/Results: No results found.  Medications:    . aspirin EC  325 mg Oral Daily  . azithromycin  500 mg Intravenous Q24H  . cefTRIAXone (ROCEPHIN)  IV  2 g Intravenous Q24H  . enoxaparin (LOVENOX) injection  30 mg Subcutaneous Q24H  . metoprolol tartrate  50 mg Oral BID  . nicotine  21 mg Transdermal Daily  . pantoprazole  40 mg Oral Q1200  . sodium chloride  3 mL Intravenous Q12H    acetaminophen, acetaminophen, albuterol, alum & mag hydroxide-simeth, cyclobenzaprine, docusate sodium, guaiFENesin-dextromethorphan, HYDROcodone-acetaminophen, HYDROmorphone, ipratropium, levalbuterol, ondansetron (ZOFRAN) IV, ondansetron     . sodium chloride 10 mL/hr (09/15/11 2211)    Assessment/Plan: Hypotension: Blood pressure 71/56 Patient is on metoprolol 50 mg twice a day, dose was recently increased and as per his wife is not tolerating that very well. We'll decrease metoprolol dose to 25 mg twice a day with parameters to hold for systolic blood pressure less than 110 and heart rate is a 60. Acute respiratory failure with hypoxia/ *HCAP (healthcare-associated pneumonia)  *Requiring 3 L of oxygen via nasal cannula Continue  Rocephin and azithromycin, oxygen, nebulizers, and  pulmonary toileting  NSTEMI (non-ST elevated myocardial infarction)/Abnormal EKG  *Cardiology is following-Peak troponin I was 2.14  and current trend is downward.    *Cardiology has deferred cardiac catheterization at this time *ECHO shows severe hypokinesis of the inferior myocardium as well as moderately reduced systolic function. Had echocardiogram in February 2013 at [facility] which also demonstrated 40-45% EF and eccentric MR.  *Continue aspirin and metoprolol. Once more stable cardiology is considering adding Plavix to the regimen  *Lipitor was discontinued due to concerns this may be contributing to patient's renal failure. Systolic heart failure, NYHA class II/severe mitral regurgitation  *  echocardiogram  Showed  Ef 40-45%,severe MR  *Lasix was discontinued due to acute renal failure  Acute renal failure secondary to ATN  *Creatinine 0.85 at admission and subsequently peaked at 4.21 and plateaued , nonoliguric. Renal following. Leukocytosis/Dehydration  *Resolved  Weakness generalized  *PT consult when ok by cardiology S/P carotid endarterectomy  *Stable without any neurological deficits  Hyperlipemia  *On statin therapy with Pravachol prior to admission-because of renal failure statin therapy was discontinued on 09/14/2011  *Lipid panel this admission demonstrates suboptimal HDL cholesterol 20 and mildly elevated LDL cholesterol 104  Chronic back pain  *Continue home Flexeril and Vicodin  Tobacco abuse  *Nicotine patch in place  *Patient counseled on smoking cessation .   LOS: 6 days   Martine Trageser 09/16/2011, 5:33 PM

## 2011-09-17 LAB — CBC
HCT: 34.6 % — ABNORMAL LOW (ref 39.0–52.0)
Hemoglobin: 11.8 g/dL — ABNORMAL LOW (ref 13.0–17.0)
RDW: 14 % (ref 11.5–15.5)
WBC: 11 10*3/uL — ABNORMAL HIGH (ref 4.0–10.5)

## 2011-09-17 LAB — CULTURE, RESPIRATORY W GRAM STAIN: Culture: NORMAL

## 2011-09-17 LAB — RENAL FUNCTION PANEL
CO2: 22 mEq/L (ref 19–32)
GFR calc Af Amer: 18 mL/min — ABNORMAL LOW (ref >90)
Glucose, Bld: 92 mg/dL (ref 70–99)
Potassium: 4.6 mEq/L (ref 3.5–5.1)
Sodium: 136 mEq/L (ref 135–145)

## 2011-09-17 NOTE — Progress Notes (Signed)
Subjective: Patient seen and examined, feeling better today. He denies chest pain or shortness of breath.  Objective: Vital signs in last 24 hours: Temp:  [97.5 F (36.4 C)-98.6 F (37 C)] 98.6 F (37 C) (03/24 0545) Pulse Rate:  [80-93] 89  (03/24 0545) Resp:  [16-20] 18  (03/24 0545) BP: (71-122)/(56-79) 122/72 mmHg (03/24 0545) SpO2:  [94 %-98 %] 94 % (03/24 0545) Weight change:  Last BM Date: 09/14/11  Intake/Output from previous day: 03/23 0701 - 03/24 0700 In: 363 [P.O.:360; I.V.:3] Out: 800 [Urine:800]     Physical Exam:  General: Alert, awake, oriented x3, in no acute distress.  Heart: Regular rate and rhythm, without murmurs, rubs, gallops.  Lungs: Bibasilar Rales  Abdomen: Soft, nontender, nondistended, positive bowel sounds.  Extremities: Trace pedal edema with positive pedal pulses.  Neuro: Grossly intact, nonfocal.       Lab Results: Results for orders placed during the hospital encounter of 09/10/11 (from the past 24 hour(s))  RENAL FUNCTION PANEL     Status: Abnormal   Collection Time   09/17/11  6:35 AM      Component Value Range   Sodium 136  135 - 145 (mEq/L)   Potassium 4.6  3.5 - 5.1 (mEq/L)   Chloride 100  96 - 112 (mEq/L)   CO2 22  19 - 32 (mEq/L)   Glucose, Bld 92  70 - 99 (mg/dL)   BUN 63 (*) 6 - 23 (mg/dL)   Creatinine, Ser 1.61 (*) 0.50 - 1.35 (mg/dL)   Calcium 8.9  8.4 - 09.6 (mg/dL)   Phosphorus 5.7 (*) 2.3 - 4.6 (mg/dL)   Albumin 3.1 (*) 3.5 - 5.2 (g/dL)   GFR calc non Af Amer 15 (*) >90 (mL/min)   GFR calc Af Amer 18 (*) >90 (mL/min)  CBC     Status: Abnormal   Collection Time   09/17/11  6:35 AM      Component Value Range   WBC 11.0 (*) 4.0 - 10.5 (K/uL)   RBC 3.74 (*) 4.22 - 5.81 (MIL/uL)   Hemoglobin 11.8 (*) 13.0 - 17.0 (g/dL)   HCT 04.5 (*) 40.9 - 52.0 (%)   MCV 92.5  78.0 - 100.0 (fL)   MCH 31.6  26.0 - 34.0 (pg)   MCHC 34.1  30.0 - 36.0 (g/dL)   RDW 81.1  91.4 - 78.2 (%)   Platelets 307  150 - 400 (K/uL)     Studies/Results: No results found.  Medications:    . aspirin EC  325 mg Oral Daily  . azithromycin  500 mg Intravenous Q24H  . cefTRIAXone (ROCEPHIN)  IV  2 g Intravenous Q24H  . enoxaparin (LOVENOX) injection  30 mg Subcutaneous Q24H  . metoprolol tartrate  50 mg Oral BID  . nicotine  21 mg Transdermal Daily  . pantoprazole  40 mg Oral Q1200  . sodium chloride  3 mL Intravenous Q12H  . DISCONTD: metoprolol tartrate  50 mg Oral BID    acetaminophen, acetaminophen, albuterol, alum & mag hydroxide-simeth, cyclobenzaprine, docusate sodium, guaiFENesin-dextromethorphan, HYDROcodone-acetaminophen, HYDROmorphone, ipratropium, levalbuterol, ondansetron (ZOFRAN) IV, ondansetron     . sodium chloride 10 mL/hr (09/15/11 2211)    Assessment/Plan:  Hypotension:  Blood pressure improved and stabilized.Continue metoprolol  25 mg twice a day with parameters to hold for systolic blood pressure less than 110 and heart rate <60.  Acute respiratory failure with hypoxia/ *HCAP (healthcare-associated pneumonia)  *Hypoxemic requiring 3 L of oxygen via nasal cannula . Chest x-ray on  3/20 showed no improvement Patient received vancomycin ,Zosyn and Levaquin on 3/17-3/20  .3/20 changed to Rocephin and Zithromax ( completed 5 days of Zithromax today).Continue Rocephin  for 3 more doses To complete at least 10 days of antibiotics ,  Continue oxygen, nebulizers, and pulmonary toileting . Repeat chest x-ray after completion of therapy. NSTEMI (non-ST elevated myocardial infarction)/Abnormal EKG  *Cardiology has deferred cardiac catheterization at this time  he is more stable. *ECHO shows severe hypokinesis of the inferior myocardium as well as moderately reduced systolic function.  *Continue aspirin and metoprolol. Once more stable cardiology is considering adding Plavix to the regimen  Systolic heart failure, NYHA class II/severe mitral regurgitation  * echocardiogram Showed Ef 40-45%,severe MR   *Lasix was discontinued due to acute renal failure  Acute renal failure secondary to ATN  *Creatinine 0.85 at admission and subsequently peaked at 4.21 and plateaued now and trending down which is consistent with the pattern  of ATN   , nonoliguric. Renal following.  Leukocytosis/Dehydration  *Resolved  Weakness generalized  *PT consult when ok by cardiology  S/P carotid endarterectomy  *Stable without any neurological deficits  Hyperlipemia  *On statin therapy with Pravachol prior to admission-because of renal failure statin therapy was discontinued on 09/14/2011  *Lipid panel this admission demonstrates suboptimal HDL cholesterol 20 and mildly elevated LDL cholesterol 104  Chronic back pain  *Continue home Flexeril and Vicodin  Tobacco abuse  *Nicotine patch in place  *Patient counseled on smoking cessation .    LOS: 7 days   Charles Savage 09/17/2011, 10:06 AM

## 2011-09-17 NOTE — Progress Notes (Signed)
Patient Name: Charles Savage      SUBJECTIVE: Patient admitted a week ago with chest pain and pneumonia and abnormal cardiac enzymes. Peak troponin was 2.14 at 80% hospital The hospital course is further complicated by renal failure for which no specific mechanism has been elucidated but thought to be HTN. Creatinine has plateaued in the low 4.0 range  echo demonstrated severe mitral regurgitation with ejection fraction 45-45% and severe inferior wall hypokinesis. Catheterization has been deferred because of renal insufficiency  It is worth noting perhaps that in February he had a stroke thought to related to his carotid artery. He has had an MI as suggested by his inferior wall motion abnormality and presumably(realizing I'm not very smart about this) secondary mitral regurgitation and and has had renal failure. I wonder whether there is any possibility that these episodes are not all thromboembolic and isn't worth looking for a right to left shunt.   The patient denies chest pain, shortness of breath, nocturnal dyspnea, orthopnea or peripheral edema.  There have been no palpitations, lightheadedness or syncope.  \ The patient had an episode of hypotension yesterday; his metoprolol dose has been decreased  Past Medical History  Diagnosis Date  . Chronic pain     back pain  . Stroke   . Hyperlipemia   . Lumbar disc disease   . Carotid artery disease     PHYSICAL EXAM Filed Vitals:   09/16/11 1700 09/16/11 1832 09/16/11 2130 09/17/11 0545  BP: 93/62 100/75 105/73 122/72  Pulse: 80 93 88 89  Temp:   98.4 F (36.9 C) 98.6 F (37 C)  TempSrc:      Resp: 16  16 18   Height:      Weight:      SpO2: 98%  98% 94%    Well developed and nourished in no acute distress HENT normal Neck supple with JVP-flat Clear Regular rate and rhythm, no murmurs or gallops Abd-soft with active BS No Clubbing cyanosis edema Skin-warm and dry A & Oriented  Grossly normal sensory and motor  function   TELEMETRY: Reviewed telemetry pt in  nsr    Intake/Output Summary (Last 24 hours) at 09/17/11 0755 Last data filed at 09/16/11 1500  Gross per 24 hour  Intake    363 ml  Output    800 ml  Net   -437 ml    LABS: Basic Metabolic Panel:  Lab 09/16/11 2130 09/15/11 0548 09/14/11 0545 09/13/11 0455 09/12/11 2006 09/12/11 1337 09/12/11 0846  NA 135 137 137 135 136 135 136  K 4.8 4.3 4.7 4.4 4.3 5.1 5.4*  CL 99 101 99 99 99 98 99  CO2 22 21 24 22 24 23 24   GLUCOSE 106* 97 91 98 110* 106* 126*  BUN 56* 49* 42* 32* 29* 25* 23  CREATININE 4.04* 4.16* 4.21* 3.90* 3.57* 3.29* 3.04*  CALCIUM 9.0 9.2 -- -- -- -- --  MG -- -- -- -- -- -- --  PHOS -- -- -- -- -- -- --   Cardiac Enzymes: No results found for this basename: CKTOTAL:3,CKMB:3,CKMBINDEX:3,TROPONINI:3 in the last 72 hours CBC:  Lab 09/17/11 0635 09/16/11 0500 09/14/11 0545 09/13/11 0915 09/13/11 0455 09/12/11 0500 09/11/11 0313 09/10/11 1447  WBC 11.0* 10.5 10.7* -- 12.3* 14.1* 12.3* 11.0*  NEUTROABS -- -- -- 7.4 -- -- -- --  HGB 11.8* 12.1* 12.1* -- 11.7* 12.6* 12.1* 13.4  HCT 34.6* 35.5* 35.3* -- 34.0* 36.0* 35.1* 39.0  MCV 92.5 92.7 93.4 --  90.7 90.9 90.9 91.5  PLT 307 329 309 -- 304 320 321 345   PROTIME: No results found for this basename: LABPROT:3,INR:3 in the last 72 hours Liver Function Tests:  Basename 09/15/11 0548  AST 24  ALT 16  ALKPHOS 62  BILITOT 0.2*  PROT 6.9  ALBUMIN 3.0*     ASSESSMENT AND PLAn for now continued medical therapy is in order--metoprolol dose decreased. Afterload reduction in the setting of mitral regurgitation is no acute issue especially given the paucity of symptoms As noted above, I wonder if there is a unifying diagnosis for his stroke, presumed MI, and renal insufficiency. As a possible if there is a right to left shunt. I will defer this to Dr. Donnie Aho.     Signed, Sherryl Manges MD  09/17/2011

## 2011-09-17 NOTE — Progress Notes (Addendum)
Patient ID: Charles Savage, male   DOB: March 09, 1956, 56 y.o.   MRN: [id number]   [facility] Progress Note    Subjective:   No acute events overnight and continues to not have acute SOB/CP, mild dyspepsia persists- transient hypotension noted yesterday   Objective:   BP 122/72  Pulse 89  Temp(Src) 98.6 F (37 C) (Oral)  Resp 18  Ht 5\' 8"  (1.727 m)  Wt 92 kg (202 lb 13.2 oz)  BMI 30.84 kg/m2  SpO2 94%  Intake/Output Summary (Last 24 hours) at 09/17/11 0920 Last data filed at 09/16/11 1500  Gross per 24 hour  Intake      3 ml  Output    800 ml  Net   -797 ml   Weight change:   Physical Exam: WUJ:WJXBJYNWGNF sleeping in bed  AOZ:HYQMV RRR, normal S1 and S2  Resp:Coarse rales bibasally, no rhonchi/wheeze  HQI:ONGE, obese, NT, BS normal Ext: Trace LE edema  Imaging: No results found.  Labs: BMET  Lab 09/17/11 9528 09/16/11 0500 09/15/11 0548 09/14/11 0545 09/13/11 0455 09/12/11 2006 09/12/11 1337  NA 136 135 137 137 135 136 135  K 4.6 4.8 4.3 4.7 4.4 4.3 5.1  CL 100 99 101 99 99 99 98  CO2 22 22 21 24 22 24 23   GLUCOSE 92 106* 97 91 98 110* 106*  BUN 63* 56* 49* 42* 32* 29* 25*  CREATININE 3.99* 4.04* 4.16* 4.21* 3.90* 3.57* 3.29*  ALB -- -- -- -- -- -- --  CALCIUM 8.9 9.0 9.2 9.2 8.9 8.9 9.1  PHOS 5.7* -- -- -- -- -- --   CBC  Lab 09/17/11 0635 09/16/11 0500 09/14/11 0545 09/13/11 0915 09/13/11 0455  WBC 11.0* 10.5 10.7* -- 12.3*  NEUTROABS -- -- -- 7.4 --  HGB 11.8* 12.1* 12.1* -- 11.7*  HCT 34.6* 35.5* 35.3* -- 34.0*  MCV 92.5 92.7 93.4 -- 90.7  PLT 307 329 309 -- 304    Medications:      . aspirin EC  325 mg Oral Daily  . azithromycin  500 mg Intravenous Q24H  . cefTRIAXone (ROCEPHIN)  IV  2 g Intravenous Q24H  . enoxaparin (LOVENOX) injection  30 mg Subcutaneous Q24H  . metoprolol tartrate  50 mg Oral BID  . nicotine  21 mg Transdermal Daily  . pantoprazole  40 mg Oral Q1200  . sodium chloride  3 mL Intravenous Q12H  .  DISCONTD: metoprolol tartrate  50 mg Oral BID     Assessment/ Plan:   1. CAP- improving clinically on azithromycin and Rocephin-afebrile and without features of impending sepsis.  2. NSTEMI- Troponin positive x3. Cardiology following and plans are noted for elective cardiac catheterization once the patient is recovered from renal failure/pneumonia in order to limit for the renal toxicity from intravenous contrast.  3. Acute Renal Failure- renal function continues to show improvement and recovery from what clinically now appears consistent with ATN, non-oliguric (UOP not charted for the 2nd half of yesterday). Serologies negative for glomerulonephritis and urinary sediment remains bland. No acute electrolyte issues recognized at this time and no indications for intravenous fluids. Dr Odessa Fleming suspicion of thromboembolism noted but UA bland (No hematuria to suggest infarct) and C3/C4 point away from atheroembolism. Will need to be vigilant with his hypotension and ongoing BID metoprolol. 4. CHF- Echo shows EF 40-45 %, CXR shows underlying vascular congestion and small left pleural effusion noted. Pt was receiving lasix 40mg  q12 but it was discontinued due to  concerns over renal function. Slight increase in LE edema today.  5. Severe MR- Echo suggestive of severe MR, pt had recent echo done at [facility]. Cardiology is requesting those records to see if this is a new finding.    Zetta Bills, MD 09/17/2011, 9:20 AM

## 2011-09-18 LAB — RENAL FUNCTION PANEL
Albumin: 3.2 g/dL — ABNORMAL LOW (ref 3.5–5.2)
BUN: 56 mg/dL — ABNORMAL HIGH (ref 6–23)
Chloride: 102 mEq/L (ref 96–112)
GFR calc Af Amer: 21 mL/min — ABNORMAL LOW (ref >90)
Glucose, Bld: 93 mg/dL (ref 70–99)
Potassium: 4 mEq/L (ref 3.5–5.1)

## 2011-09-18 LAB — CBC
HCT: 37.3 % — ABNORMAL LOW (ref 39.0–52.0)
Platelets: 348 10*3/uL (ref 150–400)
RDW: 14.1 % (ref 11.5–15.5)
WBC: 9.4 10*3/uL (ref 4.0–10.5)

## 2011-09-18 LAB — URINALYSIS, ROUTINE W REFLEX MICROSCOPIC
Glucose, UA: NEGATIVE mg/dL
Leukocytes, UA: NEGATIVE
Nitrite: NEGATIVE
Protein, ur: NEGATIVE mg/dL

## 2011-09-18 MED ORDER — ASPIRIN EC 81 MG PO TBEC
81.0000 mg | DELAYED_RELEASE_TABLET | Freq: Every day | ORAL | Status: DC
  Administered 2011-09-19 – 2011-09-20 (×2): 81 mg via ORAL
  Filled 2011-09-18 (×2): qty 1

## 2011-09-18 MED ORDER — CLOPIDOGREL BISULFATE 75 MG PO TABS
75.0000 mg | ORAL_TABLET | Freq: Every day | ORAL | Status: DC
  Administered 2011-09-18 – 2011-09-20 (×3): 75 mg via ORAL
  Filled 2011-09-18 (×3): qty 1

## 2011-09-18 NOTE — Progress Notes (Signed)
Subjective: Patient seen and examined, feeling generally weak but denies any chest pain or shortness of breath.  Objective: Vital signs in last 24 hours: Temp:  [98 F (36.7 C)-98.4 F (36.9 C)] 98 F (36.7 C) (03/25 0550) Pulse Rate:  [98-99] 98  (03/25 0550) Resp:  [18] 18  (03/25 0550) BP: (120-126)/(70-83) 126/83 mmHg (03/25 0550) SpO2:  [96 %] 96 % (03/25 0550) Weight change:  Last BM Date: 09/17/11  Intake/Output from previous day: 03/24 0701 - 03/25 0700 In: 123 [P.O.:120; I.V.:3] Out: 820 [Urine:820]     Physical Exam: General: Alert, awake, oriented x3, in no acute distress.  Heart: Regular rate and rhythm, without murmurs, rubs, gallops.  Lungs: Bibasilar Rales  Abdomen: Soft, nontender, nondistended, positive bowel sounds.  Extremities: Trace pedal edema with positive pedal pulses.  Neuro: Grossly intact, nonfocal     Lab Results: Results for orders placed during the hospital encounter of 09/10/11 (from the past 24 hour(s))  URINALYSIS, ROUTINE W REFLEX MICROSCOPIC     Status: Abnormal   Collection Time   09/18/11  5:52 AM      Component Value Range   Color, Urine YELLOW  YELLOW    APPearance CLOUDY (*) CLEAR    Specific Gravity, Urine 1.013  1.005 - 1.030    pH 5.5  5.0 - 8.0    Glucose, UA NEGATIVE  NEGATIVE (mg/dL)   Hgb urine dipstick NEGATIVE  NEGATIVE    Bilirubin Urine NEGATIVE  NEGATIVE    Ketones, ur NEGATIVE  NEGATIVE (mg/dL)   Protein, ur NEGATIVE  NEGATIVE (mg/dL)   Urobilinogen, UA 0.2  0.0 - 1.0 (mg/dL)   Nitrite NEGATIVE  NEGATIVE    Leukocytes, UA NEGATIVE  NEGATIVE   RENAL FUNCTION PANEL     Status: Abnormal   Collection Time   09/18/11  6:35 AM      Component Value Range   Sodium 141  135 - 145 (mEq/L)   Potassium 4.0  3.5 - 5.1 (mEq/L)   Chloride 102  96 - 112 (mEq/L)   CO2 26  19 - 32 (mEq/L)   Glucose, Bld 93  70 - 99 (mg/dL)   BUN 56 (*) 6 - 23 (mg/dL)   Creatinine, Ser 1.61 (*) 0.50 - 1.35 (mg/dL)   Calcium 9.0  8.4 - 09.6  (mg/dL)   Phosphorus 5.5 (*) 2.3 - 4.6 (mg/dL)   Albumin 3.2 (*) 3.5 - 5.2 (g/dL)   GFR calc non Af Amer 18 (*) >90 (mL/min)   GFR calc Af Amer 21 (*) >90 (mL/min)  CBC     Status: Abnormal   Collection Time   09/18/11  6:35 AM      Component Value Range   WBC 9.4  4.0 - 10.5 (K/uL)   RBC 3.98 (*) 4.22 - 5.81 (MIL/uL)   Hemoglobin 12.5 (*) 13.0 - 17.0 (g/dL)   HCT 04.5 (*) 40.9 - 52.0 (%)   MCV 93.7  78.0 - 100.0 (fL)   MCH 31.4  26.0 - 34.0 (pg)   MCHC 33.5  30.0 - 36.0 (g/dL)   RDW 81.1  91.4 - 78.2 (%)   Platelets 348  150 - 400 (K/uL)    Studies/Results: No results found.  Medications:    . aspirin EC  81 mg Oral Daily  . cefTRIAXone (ROCEPHIN)  IV  2 g Intravenous Q24H  . clopidogrel  75 mg Oral Q breakfast  . enoxaparin (LOVENOX) injection  30 mg Subcutaneous Q24H  . metoprolol tartrate  50 mg Oral BID  . nicotine  21 mg Transdermal Daily  . pantoprazole  40 mg Oral Q1200  . sodium chloride  3 mL Intravenous Q12H  . DISCONTD: aspirin EC  325 mg Oral Daily    acetaminophen, acetaminophen, albuterol, alum & mag hydroxide-simeth, cyclobenzaprine, docusate sodium, guaiFENesin-dextromethorphan, HYDROcodone-acetaminophen, HYDROmorphone, ipratropium, levalbuterol, ondansetron (ZOFRAN) IV, ondansetron     . sodium chloride 10 mL/hr (09/15/11 2211)    Assessment/Plan: Hypotension:  Blood pressure improved and stabilized.Continue metoprolol 25 mg twice a day with parameters to hold for systolic blood pressure less than 110 and heart rate <60.  Acute respiratory failure with hypoxia/ *HCAP (healthcare-associated pneumonia)  *Hypoxemic requiring 3 -4L of oxygen via nasal cannula . Chest x-ray on 3/20 showed no improvement  Continue Rocephin , switch to by mouth Avelox tomorrow , Continue oxygen, nebulizers, and pulmonary toileting . Repeat chest x-ray tomorrow, try to wean gradually off oxygen if he remained hypoxemic he will require home oxygen.  NSTEMI (non-ST elevated  myocardial infarction)/Abnormal EKG  *Cardiology has deferred cardiac catheterization at this time because of renal failure. *ECHO shows severe hypokinesis of the inferior myocardium as well as moderately reduced systolic function. Recommendations to recheck echocardiogram noted.  *Continue aspirin and metoprolol.  Plavix was added by Dr Donnie Aho  Systolic heart failure, NYHA class II/severe mitral regurgitation  * echocardiogram Showed Ef 40-45%,severe MR  *Lasix was discontinued due to acute renal failure  Acute renal failure secondary to ATN  *Creatinine 0.85 at admission and subsequently peaked at 4.21 and plateaued now and trending down which is consistent with the pattern of ATN , nonoliguric. Renal following.  Leukocytosis/Dehydration  *Resolved  Weakness generalized  *PT consulted, outpatient PT recommended S/P carotid endarterectomy  *Stable without any neurological deficits  Hyperlipemia  *On statin therapy with Pravachol prior to admission-because of renal failure statin therapy was discontinued on 09/14/2011  *Lipid panel this admission demonstrates suboptimal HDL cholesterol 20 and mildly elevated LDL cholesterol 104  Chronic back pain  *Continue home Flexeril and Vicodin  Tobacco abuse  *Nicotine patch in place  *Patient counseled on smoking cessation . Disposition: To home hopefully in 1-2 days     LOS: 8 days   Lexa Coronado 09/18/2011, 1:07 PM

## 2011-09-18 NOTE — Progress Notes (Signed)
Subjective:  Overall feels a lot better.  Mild hyoptension and beta blocker dose reduced. NO chest pain, less purulent sputum. Still with significant renal failure  Objective:  Vital Signs in the last 24 hours: BP 126/83  Pulse 98  Temp(Src) 98 F (36.7 C) (Oral)  Resp 18  Ht 5\' 8"  (1.727 m)  Wt 92 kg (202 lb 13.2 oz)  BMI 30.84 kg/m2  SpO2 96%  Physical Exam: Pleasant obese WM in NAD Lungs: Rales post lung L>R but improved from Friday Cardiac:  Rapid regular rhythm, normal S1 and S2, ? S4, again, no murmur heard today. Extremities:  No edema present  Intake/Output from previous day: 03/24 0701 - 03/25 0700 In: 123 [P.O.:120; I.V.:3] Out: 820 [Urine:820]  Lab Results: Basic Metabolic Panel:  Basename 09/18/11 0635 09/17/11 0635  NA 141 136  K 4.0 4.6  CL 102 100  CO2 26 22  GLUCOSE 93 92  BUN 56* 63*  CREATININE 3.44* 3.99*   CBC:  Basename 09/18/11 0635 09/17/11 0635  WBC 9.4 11.0*  NEUTROABS -- --  HGB 12.5* 11.8*  HCT 37.3* 34.6*  MCV 93.7 92.5  PLT 348 307   Telemetry: Sinus rhythm  Assessment/Plan:  1. Acute renal failure - nephrology currently seeing.  Currently plateaued 2. Abnormal ECHO [facility] ECHO results reviewed.  EF was 40-45% there. Also eccentric MR noted on their ECHO 3. Mild CHF with EF 40-45% 4. Abnormal EKG c/w ischemia 5.  Pneumonia under treatment improving   Rec:  1.  Clinically a lot better, but still with ARF.  I would defer cardiac cath until complete resolution of ATN.   Not candidate for ACE or ARB at present secondary to reanal failure.  2. Add Plavix to regimen for nonSTEMI   3. Recheck ECHO  W. Ashley Royalty  MD [facility]  09/18/2011, 10:01 AM

## 2011-09-18 NOTE — Evaluation (Signed)
Physical Therapy Evaluation Patient Details Name: Charles Savage MRN: [id number] DOB: Jul 28, 1955 Today's Date: 09/18/2011  Problem List:  Patient Active Problem List  Diagnoses  . HCAP (healthcare-associated pneumonia)  . NSTEMI (non-ST elevated myocardial infarction)  . Chronic back pain  . Weakness generalized  . S/P carotid endarterectomy  . Hyperlipemia  . Lumbar disc disease  . Carotid artery disease  . Acute respiratory failure with hypoxia  . Leukocytosis  . Abnormal EKG  . Tobacco abuse  . Dehydration  . 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 Severe mitral regurgitation  . Acute renal failure secondary to ATN versus acute interstitial nephritis    Past Medical History:  Past Medical History  Diagnosis Date  . Chronic pain     back pain  . Stroke   . Hyperlipemia   . Lumbar disc disease   . Carotid artery disease    Past Surgical History:  Past Surgical History  Procedure Date  . Back surgery [id number]  . Knee arthroscopy 2002    rt  . Foreign body removal 06/05/2011    Procedure: FOREIGN BODY REMOVAL ADULT;  Surgeon: Rosalio Macadamia;  Location: [facility];  Service: Plastics;  Laterality: Right;  removal foreign body from right side face   . Carotid endarterectomy     PT Assessment/Plan/Recommendation PT Assessment Clinical Impression Statement: Pt presents with generalized weakness and decreased activity tolerance as well as delayed balance reactions in distracting environments.  Pt will benefit from skilled PT in the acute setting. PT Recommendation/Assessment: Patient will need skilled PT in the acute care venue PT Problem List: Decreased strength;Decreased activity tolerance;Decreased balance;Decreased coordination;Cardiopulmonary status limiting activity PT Therapy Diagnosis : Difficulty walking;Generalized weakness PT Plan PT Frequency: Min 3X/week PT Treatment/Interventions: Gait  training;DME instruction;Stair training;Functional mobility training;Balance training;Neuromuscular re-education;Therapeutic exercise;Therapeutic activities;Patient/family education PT Recommendation Follow Up Recommendations: Outpatient PT Equipment Recommended: None recommended by PT PT Goals  Acute Rehab PT Goals PT Goal Formulation: With patient Time For Goal Achievement: 7 days Pt will Transfer Bed to Chair/Chair to Bed: with modified independence Pt will Ambulate: >150 feet;with modified independence Pt will Go Up / Down Stairs: 3-5 stairs;with modified independence Pt will Perform Home Exercise Program: Independently  PT Evaluation Precautions/Restrictions  Precautions Precautions: Fall Required Braces or Orthoses: No Restrictions Weight Bearing Restrictions: No Prior Functioning  Home Living Lives With: Spouse Receives Help From: Family Type of Home: House Home Layout: One level Home Access: Stairs to enter Secretary/administrator of Steps: 5   Cognition Cognition Arousal/Alertness: Awake/alert Overall Cognitive Status: Appears within functional limits for tasks assessed Sensation/Coordination Sensation Light Touch: Appears Intact Proprioception: Appears Intact Coordination Gross Motor Movements are Fluid and Coordinated: Yes Extremity Assessment RLE Assessment RLE Assessment: Within Functional Limits LLE Assessment LLE Assessment: Within Functional Limits Mobility (including Balance) Bed Mobility Bed Mobility: Yes Supine to Sit: 7: Independent Transfers Sit to Stand: 6: Modified independent (Device/Increase time) Stand to Sit: 6: Modified independent (Device/Increase time) Stand Pivot Transfers: 5: Supervision Stand Pivot Transfer Details (indicate cue type and reason): close supervision, pt with increased sway but able to correct minor LOB Ambulation/Gait Ambulation/Gait: Yes Ambulation/Gait Assistance: 5: Supervision Ambulation/Gait Assistance Details  (indicate cue type and reason): supervision, cues for obstacle negotiation. dynamic gait challenges with close supervision for looking all directions, stop/start, fwd/backward gait Ambulation Distance (Feet): 150 Feet Assistive device: None Gait velocity: slow Stairs: No Wheelchair Mobility Wheelchair Mobility: No  Posture/Postural Control Posture/Postural Control: No  significant limitations Static Standing Balance Static Standing - Level of Assistance: 6: Modified independent (Device/Increase time) Dynamic Standing Balance Dynamic Standing - Level of Assistance: 5: Stand by assistance (for functional task) Exercise    End of Session PT - End of Session Equipment Utilized During Treatment: Gait belt Activity Tolerance: Patient tolerated treatment well Patient left: in bed;with call bell in reach Nurse Communication: Mobility status for transfers General Behavior During Session: [facility] for tasks performed Cognition: [facility] for tasks performed  [facility] 09/18/2011, 4:26 PM

## 2011-09-18 NOTE — Progress Notes (Signed)
PA Student Daily Progress Note [facility] Subjective: Pt states he is feeling much better. He states his sob with exertion is improving. He denies sob at rest. He denies any chest pain. He states he does not have much of an appetite. Had some episodes of hypotension over the weekend. Cardiology decreased his Metoprolol. Objective:  Filed Vitals:   09/17/11 0545 09/17/11 1054 09/17/11 2200 09/18/11 0550  BP: 122/72 117/76 120/70 126/83  Pulse: 89 93 99 98  Temp: 98.6 F (37 C)  98.4 F (36.9 C) 98 F (36.7 C)  TempSrc:      Resp: 18  18 18   Height:      Weight:      SpO2: 94%  96% 96%   Physical Exam: General: Alert, awake, oriented x3, in no acute distress.  Heart: Regular rate and rhythm, without murmurs, rubs, gallops.  Lungs: Bibasilar Rales  Abdomen: Soft, nontender, nondistended, positive bowel sounds.  Extremities: Trace pedal edema with positive pedal pulses.  Neuro: Grossly intact, nonfocal.  Assessment/Plan: 1. CAP- improving clinically on azithromycin and Rocephin  2. NSTEMI- Troponin positive x3. Cardiology following and plans are noted for elective cardiac catheterization once the patient is recovered from renal failure/pneumonia in order to limit for the renal toxicity from intravenous contrast.  3. Acute Renal Failure- renal function continues to show improvement and recovery from what clinically now appears consistent with ATN. Scr today 3.44 decreased form 3.99 yesterday. Urine output yesterday 820 ml. Serologies negative for glomerulonephritis. No acute electrolyte abnormalities.  Had several episodes of hypotension over the weekend. Cardiology decreased his Metoprolol dose. Will keep off diuretics and hopefully continue to see renal function improve.  If function continues to improve slowly, consider d/c/ home before cath is done? 4. CHF- Echo shows EF 40-45 %, CXR shows underlying vascular congestion and small left pleural effusion noted. Pt was  receiving lasix 40mg  q12 but it was discontinued due to concerns over renal function. Bibasilar rales still present. No signs of sig vol overload. 5. Severe MR- Echo suggestive of severe MR, pt had recent echo done at [facility]. Cardiology is requesting those records to see if this is a new finding.  6. Hyperphosphatemia- Phosphate yesterday 5.7  Labs: Basic Metabolic Panel:  Lab 09/18/11 1610 09/17/11 0635 09/16/11 0500  NA 141 136 135  K 4.0 4.6 4.8  CL 102 100 99  CO2 26 22 22   GLUCOSE 93 92 106*  BUN 56* 63* 56*  CREATININE 3.44* 3.99* 4.04*  CALCIUM 9.0 8.9 9.0  ALB -- -- --  PHOS 5.5* 5.7* --   Liver Function Tests:  Lab 09/18/11 9604 09/17/11 5409 09/15/11 0548 09/14/11 0545 09/12/11 1337  AST -- -- 24 24 26   ALT -- -- 16 13 13   ALKPHOS -- -- 62 61 60  BILITOT -- -- 0.2* 0.3 0.5  PROT -- -- 6.9 7.0 7.2  ALBUMIN 3.2* 3.1* 3.0* -- --   No results found for this basename: LIPASE:3,AMYLASE:3 in the last 168 hours No results found for this basename: AMMONIA:3 in the last 168 hours INR: @resultsinr3 @ CBC:  Lab 09/18/11 0635 09/17/11 0635 09/16/11 0500 09/14/11 0545 09/13/11 0915 09/13/11 0455  WBC 9.4 11.0* 10.5 -- -- --  NEUTROABS -- -- -- -- 7.4 --  HGB 12.5* 11.8* 12.1* -- -- --  HCT 37.3* 34.6* 35.5* -- -- --  MCV 93.7 92.5 92.7 93.4 -- 90.7  PLT 348 307 329 -- -- --   Blood Culture  Component Value Date/Time   SDES SPUTUM 09/14/2011 2124   SDES SPUTUM 09/14/2011 2124   SPECREQUEST Normal 09/14/2011 2124   SPECREQUEST NONE 09/14/2011 2124   CULT NORMAL OROPHARYNGEAL FLORA 09/14/2011 2124   REPTSTATUS 09/14/2011 FINAL 09/14/2011 2124   REPTSTATUS 09/17/2011 FINAL 09/14/2011 2124    Cardiac Enzymes: No results found for this basename: CKTOTAL:5,CKMB:5,CKMBINDEX:5,TROPONINI:5 in the last 168 hours CBG: No results found for this basename: GLUCAP:5 in the last 168 hours Iron Studies: No results found for this basename: IRON,TIBC,TRANSFERRIN,FERRITIN in the last 72  hours  Micro Results: Recent Results (from the past 240 hour(s))  CULTURE, BLOOD (ROUTINE X 2)     Status: Normal   Collection Time   09/10/11  3:34 PM      Component Value Range Status Comment   Specimen Description BLOOD RIGHT HAND   Final    Special Requests BOTTLES DRAWN AEROBIC AND ANAEROBIC 10CC   Final    Culture  Setup Time [id number]   Final    Culture NO GROWTH 5 DAYS   Final    Report Status 09/16/2011 FINAL   Final   CULTURE, BLOOD (ROUTINE X 2)     Status: Normal   Collection Time   09/10/11  3:35 PM      Component Value Range Status Comment   Specimen Description BLOOD RIGHT ARM   Final    Special Requests BOTTLES DRAWN AEROBIC AND ANAEROBIC 10CC   Final    Culture  Setup Time [id number]   Final    Culture NO GROWTH 5 DAYS   Final    Report Status 09/16/2011 FINAL   Final   CULTURE, SPUTUM-ASSESSMENT     Status: Normal   Collection Time   09/11/11  6:36 PM      Component Value Range Status Comment   Specimen Description SPUTUM   Final    Special Requests NONE   Final    Sputum evaluation     Final    Value: MICROSCOPIC FINDINGS SUGGEST THAT THIS SPECIMEN IS NOT REPRESENTATIVE OF LOWER RESPIRATORY SECRETIONS. PLEASE RECOLLECT.     CALLED TO RN E.SMITH AT 1923 09/11/11 BY L.PITT   Report Status 09/11/2011 FINAL   Final   CULTURE, SPUTUM-ASSESSMENT     Status: Normal   Collection Time   09/14/11  1:38 AM      Component Value Range Status Comment   Specimen Description SPUTUM   Final    Special Requests NONE   Final    Sputum evaluation     Final    Value: MICROSCOPIC FINDINGS SUGGEST THAT THIS SPECIMEN IS NOT REPRESENTATIVE OF LOWER RESPIRATORY SECRETIONS. PLEASE RECOLLECT.     CALLED TO A.BOEHLING,RN 0248 09/14/11 M.CAMPBELL   Report Status 09/14/2011 FINAL   Final   CULTURE, SPUTUM-ASSESSMENT     Status: Normal   Collection Time   09/14/11  9:24 PM      Component Value Range Status Comment   Specimen Description SPUTUM   Final    Special Requests Normal    Final    Sputum evaluation     Final    Value: THIS SPECIMEN IS ACCEPTABLE. RESPIRATORY CULTURE REPORT TO FOLLOW.   Report Status 09/14/2011 FINAL   Final   CULTURE, RESPIRATORY     Status: Normal   Collection Time   09/14/11  9:24 PM      Component Value Range Status Comment   Specimen Description SPUTUM   Final    Special Requests NONE  Final    Gram Stain     Final    Value: RARE WBC PRESENT,BOTH PMN AND MONONUCLEAR     RARE SQUAMOUS EPITHELIAL CELLS PRESENT     NO ORGANISMS SEEN   Culture NORMAL OROPHARYNGEAL FLORA   Final    Report Status 09/17/2011 FINAL   Final    Studies/Results: No results found. Medications: Scheduled Meds:   . aspirin EC  325 mg Oral Daily  . azithromycin  500 mg Intravenous Q24H  . cefTRIAXone (ROCEPHIN)  IV  2 g Intravenous Q24H  . enoxaparin (LOVENOX) injection  30 mg Subcutaneous Q24H  . metoprolol tartrate  50 mg Oral BID  . nicotine  21 mg Transdermal Daily  . pantoprazole  40 mg Oral Q1200  . sodium chloride  3 mL Intravenous Q12H   Continuous Infusions:   . sodium chloride 10 mL/hr (09/15/11 2211)   PRN Meds:.acetaminophen, acetaminophen, albuterol, alum & mag hydroxide-simeth, cyclobenzaprine, docusate sodium, guaiFENesin-dextromethorphan, HYDROcodone-acetaminophen, HYDROmorphone, ipratropium, levalbuterol, ondansetron (ZOFRAN) IV, ondansetron   This is a Psychologist, occupational Note.  The care of the patient was discussed with [facility] and the assessment and plan formulated with their assistance.  Please see their attached note for official documentation of the daily encounter.  Morrell Riddle PA-S2 09/18/2011, 8:23 AM  Patient seen and examined, agree with above note with above modifications.  Annie Sable, MD 09/18/2011

## 2011-09-19 ENCOUNTER — Inpatient Hospital Stay (HOSPITAL_COMMUNITY): Payer: Medicaid Other

## 2011-09-19 DIAGNOSIS — J449 Chronic obstructive pulmonary disease, unspecified: Secondary | ICD-10-CM

## 2011-09-19 DIAGNOSIS — I2510 Atherosclerotic heart disease of native coronary artery without angina pectoris: Secondary | ICD-10-CM | POA: Diagnosis present

## 2011-09-19 DIAGNOSIS — J189 Pneumonia, unspecified organism: Secondary | ICD-10-CM

## 2011-09-19 DIAGNOSIS — R0902 Hypoxemia: Secondary | ICD-10-CM

## 2011-09-19 DIAGNOSIS — J810 Acute pulmonary edema: Secondary | ICD-10-CM

## 2011-09-19 LAB — RENAL FUNCTION PANEL
Albumin: 3.1 g/dL — ABNORMAL LOW (ref 3.5–5.2)
BUN: 47 mg/dL — ABNORMAL HIGH (ref 6–23)
Chloride: 105 mEq/L (ref 96–112)
GFR calc Af Amer: 26 mL/min — ABNORMAL LOW (ref >90)
GFR calc non Af Amer: 23 mL/min — ABNORMAL LOW (ref >90)
Phosphorus: 5.4 mg/dL — ABNORMAL HIGH (ref 2.3–4.6)
Potassium: 4.1 mEq/L (ref 3.5–5.1)
Sodium: 142 mEq/L (ref 135–145)

## 2011-09-19 MED ORDER — IPRATROPIUM BROMIDE 0.02 % IN SOLN
0.5000 mg | Freq: Four times a day (QID) | RESPIRATORY_TRACT | Status: DC
  Administered 2011-09-19 – 2011-09-20 (×4): 0.5 mg via RESPIRATORY_TRACT
  Filled 2011-09-19 (×4): qty 2.5

## 2011-09-19 MED ORDER — ALBUTEROL SULFATE (5 MG/ML) 0.5% IN NEBU
2.5000 mg | INHALATION_SOLUTION | Freq: Four times a day (QID) | RESPIRATORY_TRACT | Status: DC
  Administered 2011-09-19 – 2011-09-20 (×4): 2.5 mg via RESPIRATORY_TRACT
  Filled 2011-09-19 (×4): qty 0.5

## 2011-09-19 NOTE — Plan of Care (Signed)
Problem: Problem: Bowel/Bladder Progression Goal: OTHER URINARY GOAL(S) Acute renal failure Outcome: Progressing Renal doctors following, creatanine trending down

## 2011-09-19 NOTE — Progress Notes (Addendum)
Subjective: Patient seen and examined, feeling better with improvement in shortness of breath and cough, denies chest pain  Objective: Vital signs in last 24 hours: Temp:  [97.3 F (36.3 C)-97.6 F (36.4 C)] 97.6 F (36.4 C) (03/26 0500) Pulse Rate:  [92-105] 105  (03/26 1034) Resp:  [18-20] 20  (03/26 0500) BP: (107-126)/(65-84) 114/70 mmHg (03/26 1034) SpO2:  [75 %-97 %] 75 % (03/26 1020) Weight change:  Last BM Date: 09/18/11  Intake/Output from previous day:   Total I/O In: 240 [P.O.:240] Out: 450 [Urine:450]   Physical Exam: General: Alert, awake, oriented x3, in no acute distress.  Heart: Regular rate and rhythm, without murmurs, rubs, gallops.  Lungs: Bibasilar Rales  Abdomen: Soft, nontender, nondistended, positive bowel sounds.  Extremities: Trace pedal edema with positive pedal pulses.  Neuro: Grossly intact, nonfocal     Lab Results: Results for orders placed during the hospital encounter of 09/10/11 (from the past 24 hour(s))  RENAL FUNCTION PANEL     Status: Abnormal   Collection Time   09/19/11  5:48 AM      Component Value Range   Sodium 142  135 - 145 (mEq/L)   Potassium 4.1  3.5 - 5.1 (mEq/L)   Chloride 105  96 - 112 (mEq/L)   CO2 25  19 - 32 (mEq/L)   Glucose, Bld 101 (*) 70 - 99 (mg/dL)   BUN 47 (*) 6 - 23 (mg/dL)   Creatinine, Ser 7.82 (*) 0.50 - 1.35 (mg/dL)   Calcium 9.0  8.4 - 95.6 (mg/dL)   Phosphorus 5.4 (*) 2.3 - 4.6 (mg/dL)   Albumin 3.1 (*) 3.5 - 5.2 (g/dL)   GFR calc non Af Amer 23 (*) >90 (mL/min)   GFR calc Af Amer 26 (*) >90 (mL/min)    Studies/Results: Dg Chest 2 View  09/19/2011  *RADIOLOGY REPORT*  Clinical Data: Short of breath.  Pneumonia  CHEST - 2 VIEW  Comparison: 09/13/2011  Findings: Right upper lobe infiltrate shows mild progression. Patchy bibasilar airspace disease is unchanged.  Bilateral pleural effusions, with progression on the left.  Underlying COPD.  IMPRESSION: Mild progression right upper lobe infiltrate.   This is suspicious for pneumonia.  Bibasilar atelectasis/infiltrate is unchanged.  Bilateral pleural effusions, with progression of the left pleural effusion.  Original Report Authenticated By: Camelia Phenes, M.D.    Medications:    . aspirin EC  81 mg Oral Daily  . cefTRIAXone (ROCEPHIN)  IV  2 g Intravenous Q24H  . clopidogrel  75 mg Oral Q breakfast  . enoxaparin (LOVENOX) injection  30 mg Subcutaneous Q24H  . metoprolol tartrate  50 mg Oral BID  . nicotine  21 mg Transdermal Daily  . pantoprazole  40 mg Oral Q1200  . sodium chloride  3 mL Intravenous Q12H    acetaminophen, acetaminophen, albuterol, alum & mag hydroxide-simeth, cyclobenzaprine, docusate sodium, guaiFENesin-dextromethorphan, HYDROcodone-acetaminophen, HYDROmorphone, ipratropium, levalbuterol, ondansetron (ZOFRAN) IV, ondansetron     . sodium chloride 10 mL/hr (09/15/11 2211)    Assessment/Plan:  Acute respiratory failure with hypoxia/ *HCAP (healthcare-associated pneumonia)  *Patient remained hypoxemic him a O2 sat 75% on room air at rest and 71% on room air with ambulation, requiring 4L of oxygen via nasal cannula . Repeat Chest x-ray on showed mild worsening of pneumonia and bilateral pleural effusion after almost 10 days of antibiotic therapy.I spoke to  The radiologist and pleural effusion is small . Patient received vancomycin ,Zosyn and Levaquin on 3/17-3/20 .3/20 changed to Rocephin and Zithromax (  completed 5 days of Zithromax ).Continue Rocephin ,today is  D#10  of antibiotics .Continue oxygen, nebulizers, and pulmonary toileting .  Pulmonary service was consulted for further recommendations. NSTEMI (non-ST elevated myocardial infarction)/Abnormal EKG  *Cardiology has deferred cardiac catheterization at this time because of renal failure.  *ECHO shows severe hypokinesis of the inferior myocardium as well as moderately reduced systolic function.  *Continue aspirin and metoprolol. Plavix was added by Dr  Donnie Aho  Follow with Dr.Tilley as an outpatient in 2-3 weeks Systolic heart failure, NYHA class II/severe mitral regurgitation  * echocardiogram Showed Ef 40-45%,severe MR  *Lasix was discontinued due to acute renal failure  Acute renal failure secondary to ATN  *Creatinine 0.85 at admission and subsequently peaked at 4.21 and plateaued now and trending down which is consistent with the pattern of ATN , nonoliguric. Renal signed off. Hypotension:  Blood pressure improved and stabilized.Continue metoprolol 25 mg twice a day with parameters to hold for systolic blood pressure less than 110 and heart rate <60.  Leukocytosis/Dehydration  *Resolved  Weakness generalized  *PT consulted, no PT followup recommended  S/P carotid endarterectomy  *Stable without any neurological deficits  Hyperlipemia  *On statin therapy with Pravachol prior to admission-because of renal failure statin therapy was discontinued on 09/14/2011  *Lipid panel this admission demonstrates suboptimal HDL cholesterol 20 and mildly elevated LDL cholesterol 104  Chronic back pain  *Continue home Flexeril and Vicodin  Tobacco abuse  *Nicotine patch in place  *Patient counseled on smoking cessation .  Disposition: To home hopefully in 1-2 days       LOS: 9 days   Autry Droege 09/19/2011, 1:51 PM

## 2011-09-19 NOTE — Progress Notes (Signed)
  Echocardiogram 2D Echocardiogram has been performed.  Charles Savage 09/19/2011, 6:04 PM

## 2011-09-19 NOTE — Progress Notes (Signed)
[name] CT scan of chest results called to Dr. Charleston Ropes.  No new orders received.  Will continue to monitor.  Charles Savage

## 2011-09-19 NOTE — Progress Notes (Signed)
SATURATION QUALIFICATIONS:  Patient Saturations on Room Air at Rest =75%  Patient Saturations on [employer] while Ambulating = 71%  Patient Saturations on 4 Liters of oxygen while Ambulating = 84%   Patient Saturations on 6 Liters of oxygen while Ambulating = 96%   Patient Saturations on 4 Liters of oxygen while Ambulating =93%  09/19/2011  [place] Bing, PT [phone] (pager)

## 2011-09-19 NOTE — Progress Notes (Signed)
Subjective:  Overall feels  better. No chest pain, less purulent sputum. Kidneys improving Objective:  Vital Signs in the last 24 hours: BP 126/84  Pulse 98  Temp(Src) 97.6 F (36.4 C) (Oral)  Resp 20  Ht 5\' 8"  (1.727 m)  Wt 92 kg (202 lb 13.2 oz)  BMI 30.84 kg/m2  SpO2 95%  Physical Exam: Pleasant obese WM in NAD Lungs: Rales post lung L>R but improved from Friday Cardiac:  Rapid regular rhythm, normal S1 and S2, ? S4, again, no murmur heard today. Extremities:  No edema present  Lab Results: Basic Metabolic Panel:  Basename 09/19/11 0548 09/18/11 0635  NA 142 141  K 4.1 4.0  CL 105 102  CO2 25 26  GLUCOSE 101* 93  BUN 47* 56*  CREATININE 2.89* 3.44*   CBC:  Basename 09/18/11 0635 09/17/11 0635  WBC 9.4 11.0*  NEUTROABS -- --  HGB 12.5* 11.8*  HCT 37.3* 34.6*  MCV 93.7 92.5  PLT 348 307   Telemetry: Sinus rhythm and tachycardia  Assessment/Plan:  1. Acute renal failure - nephrology currently seeing.  Currently plateaued 2. Mild CHF with EF 40-45% 3. Abnormal EKG c/w ischemia 4.  Pneumonia under treatment improving  Rec:  1.  Review ECHO 2. OK to go home from cardiac viewpoint and followup with me in 2-3 weeks.  We will consider timing of cardiac eval when renal failure resolved.   Darden Palmer  MD [facility]  09/19/2011, 9:33 AM

## 2011-09-19 NOTE — Consult Note (Signed)
Name: Charles Savage MRN: [id number] DOB: 10/18/1955    LOS: 9 Requesting NW:[id number] Regarding: Hypoxia   CONSULT NOTE  History of Present Illness: Charles Savage is a pleasant 56 yo wm,2-3 ppd smoker age 5 to date of admission. He was admitted on 3/17 with 3 days of coughing up chunks of green/gray sputum, fever and  chest pain. He had elevated cardiac enzymes and was evaluated by cardi logy with full outpatient cardiac work up planned in the future. He reports his fever/chills and sputum production have improved but he remains O2 dependent. CxR questions pneumonia.  Note he has 2 strokes in 2011 , one prior to left carotid endarectomy and one post at [facility]. PCCM asked to evaluate hypoxia. Lines / Drains:   Cultures: 3/21 sputum>>neg 3/21 bc x 2 >>neg  Antibiotics: 3/20 roc>>  Tests / Events:  2/25 2 d ------------------------------------------------------------ Left ventricle: The cavity size was normal. Systolic function was mildly to moderately reduced. The estimated ejection fraction was in the range of 40% to 45%. Moderate diffuse hypokinesis. Regional wall motion abnormalities: Severe hypokinesis of the inferior myocardium.  ------------------------------------------------------------ Aortic valve: Trileaflet; normal thickness leaflets. Mobility was not restricted. Doppler: Transvalvular velocity was within the normal range. There was no stenosis. Mild regurgitation.  ------------------------------------------------------------ Aorta: Aortic root: The aortic root was normal in size.  ------------------------------------------------------------ Mitral valve: Structurally normal valve. Mobility was not restricted. Doppler: Transvalvular velocity was within the normal range. There was no evidence for stenosis. Severe regurgitation directed eccentrically. Peak gradient: 6mm Hg  (D).  ------------------------------------------------------------ Left atrium: The atrium was normal in size.  ------------------------------------------------------------ Right ventricle: The cavity size was moderately dilated. Wall thickness was normal. Systolic function was normal.  ------------------------------------------------------------ Pulmonic valve: Doppler: Transvalvular velocity was within the normal range. There was no evidence for stenosis.  ------------------------------------------------------------ Tricuspid valve: Structurally normal valve. Doppler: Transvalvular velocity was within the normal range. No regurgitation.  ------------------------------------------------------------ Pulmonary artery: The main pulmonary artery was normal-sized. Systolic pressure was mildly increased.  ------------------------------------------------------------ Right atrium: The atrium was normal in size.  ------------------------------------------------------------ Pericardium: A small pericardial effusion was identified.  ------------------------------------------------------------ Systemic veins: Inferior vena cava: The vessel was normal in size.  ------------------------------------------------------------   Subjective:  Past Medical History  Diagnosis Date  . Chronic pain     back pain  . Stroke   . Hyperlipemia   . Lumbar disc disease   . Carotid artery disease    Past Surgical History  Procedure Date  . Back surgery [id number]  . Knee arthroscopy 2002    rt  . Foreign body removal 06/05/2011    Procedure: FOREIGN BODY REMOVAL ADULT;  Surgeon: Rosalio Macadamia;  Location: [facility];  Service: Plastics;  Laterality: Right;  removal foreign body from right side face   . Carotid endarterectomy    Prior to Admission medications   Medication Sig Start Date End Date Taking? Authorizing Provider  aspirin EC 325 MG tablet Take 650 mg by mouth  daily.   Yes Historical Provider, MD  cyclobenzaprine (FLEXERIL) 10 MG tablet Take 10 mg by mouth 3 (three) times daily as  needed.     Yes Historical Provider, MD  HYDROcodone-acetaminophen (NORCO) 10-325 MG per tablet Take 1 tablet by mouth every 8 (eight) hours as needed. Pain   Yes Historical Provider, MD  pravastatin (PRAVACHOL) 20 MG tablet Take 20 mg by mouth daily.   Yes Historical Provider, MD  meloxicam (MOBIC) 15 MG tablet Take 15 mg by mouth as needed.      Historical Provider, MD   Allergies Allergies  Allergen Reactions  . Morphine And Related Hives and Itching  . Other     Peaches cause hives and itching     Family History No family history on file.  Social History  reports that he has been smoking Cigarettes.  He has a 60 pack-year smoking history. He does not have any smokeless tobacco history on file. He reports that he does not drink alcohol or use illicit drugs. He was a 12 beer a day drinker till 18 months ago. Review Of Systems  11 points review of systems is negative with an exception of listed in HPI.  Vital Signs: Temp:  [97.5 F (36.4 C)-97.6 F (36.4 C)] 97.6 F (36.4 C) (03/26 0500) Pulse Rate:  [97-105] 105  (03/26 1034) Resp:  [20] 20  (03/26 0500) BP: (107-126)/(69-84) 114/70 mmHg (03/26 1034) SpO2:  [75 %-95 %] 75 % (03/26 1020) I/O last 3 completed shifts: In: -  Out: 300 [Urine:300]  Physical Examination: General:  wnwdwmnad Neuro:  intact   HEENT:  Old lt CEA scar noted. No JVD Cardiovascular:  hsr rrr Lungs:  Decreased in bases Abdomen:  obese Musculoskeletal:  intact Skin:  warm  Ventilator settings:    Labs and Imaging:  Dg Chest 2 View  09/19/2011  *RADIOLOGY REPORT*  Clinical Data: Short of breath.  Pneumonia  CHEST - 2 VIEW  Comparison: 09/13/2011  Findings: Right upper lobe infiltrate shows mild progression. Patchy bibasilar airspace disease is unchanged.  Bilateral pleural effusions, with progression on the left.  Underlying  COPD.  IMPRESSION: Mild progression right upper lobe infiltrate.  This is suspicious for pneumonia.  Bibasilar atelectasis/infiltrate is unchanged.  Bilateral pleural effusions, with progression of the left pleural effusion.  Original Report Authenticated By: Camelia Phenes, M.D.    Lab 09/19/11 0548 09/18/11 0635 09/17/11 0635  NA 142 141 136  K 4.1 4.0 4.6  CL 105 102 100  CO2 25 26 22   BUN 47* 56* 63*  CREATININE 2.89* 3.44* 3.99*  GLUCOSE 101* 93 92    Lab 09/18/11 0635 09/17/11 0635 09/16/11 0500  HGB 12.5* 11.8* 12.1*  HCT 37.3* 34.6* 35.5*  WBC 9.4 11.0* 10.5  PLT 348 307 329   ABG    Component Value Date/Time   PHART 7.415 09/10/2011 0800   PCO2ART 36.3 09/10/2011 0800   PO2ART 63.7* 09/10/2011 0800   HCO3 22.8 09/10/2011 0800   TCO2 19.1 09/10/2011 0800   ACIDBASEDEF 1.1 09/10/2011 0800   O2SAT 91.7 09/10/2011 0800    Assessment and Plan: Hypoxia in setting of 3 ppd smoker since age 43, recent presumed pna, + cardiac enzymes , cardiac dysfunction, deconditioning  secondary to chronic back pain and narcotic uses.  -O2 as needed. He may need home O2 until residual affects of pna have resolved, He may be O2 dependent at baseline. -BD's with continued use as outpatient -Pulmonary toilet -?diuresis with bnp 4819 -stop smoking -consider dc abx at 10 days total  + cardiac enzymes and chf  -per cards  Chronic pain - per  IM team Best practices / Disposition: -->floor status under triad -->full code -->LMWHfor DVT Px    Brett Canales Minor ACNP [facility] PCCM Pager [phone] till 3 pm If no answer page [phone] 09/19/2011, 31:36 PM   56 year old with >100 pack year of smoking who presents with PNA and ? Of cavitation on CXR (likely just a bleb).  Hypoxemia is a combination of smoking, cardiac disease and deconditioning/narcotic use.  The hypoxemia is unlikely to resolve during this admission, recommend that patient have an ambulatory desaturation study to qualify for O2 and  have a chest CT to verify that it is a bleb and not a cavitary PNA.  Patient seen and examined, agree with above note.  I dictated the care and orders written for this patient under my direction.  Koren Bound, M.D. [phone]

## 2011-09-19 NOTE — Progress Notes (Signed)
PA Student Daily Progress Note [facility] Subjective: Pt states he feels about the same as he did yesterday. He is still having sob with minimal exertion. He denies any chest pain. He continues to require cont O2 via [place] at 3.5 L. The pt states his appetite is starting to improve. The pt has had no more episodes of hypotension. Looks good, only issue is hypoxia Objective:  Filed Vitals:   09/18/11 1400 09/18/11 1618 09/18/11 2100 09/19/11 0500  BP: 109/65  107/69 126/84  Pulse: 92  97 98  Temp: 97.3 F (36.3 C)  97.5 F (36.4 C) 97.6 F (36.4 C)  TempSrc: Oral  Oral   Resp: 18  20 20   Height:      Weight:      SpO2: 97% 95% 94% 95%   Physical Exam: General: Alert, awake, oriented x3, in no acute distress.  Heart: Regular rate and rhythm, without murmurs, rubs, gallops.  Lungs: Bibasilar Rales  Abdomen: Soft, nontender, nondistended, positive bowel sounds.  Extremities: Trace pedal edema with positive pedal pulses.  Neuro: Grossly intact, nonfocal.  Assessment/Plan: 1. CAP- improving clinically. CXR done today but not yet available for review. Pt still on ceftriaxone.  2. NSTEMI- Troponin positive x3. Cardiology following and plans are noted for elective cardiac catheterization once the patient is recovered from renal failure/pneumonia in order to limit for the renal toxicity from intravenous contrast.  3. Acute Renal Failure- renal function continues to show improvement and recovery from what clinically now appears consistent with ATN. Scr today 2.89 decreased from 3.44 yesterday. No acute electrolyte abnormalities. No acute needs for HD. Improved.  Good UOp no lasix.  Anticipate continued improvement back to baseline.  Renal will sign off.  He can follow up with PCP after d/c.  If there is issue with renal function we would be happy to see as OP but no automatic follow up is needed 4. CHF- Echo shows EF 40-45 %, CXR shows underlying vascular congestion and small left  pleural effusion noted. Pt was receiving lasix 40mg  q12 but it was discontinued due to concerns over renal function. Bibasilar rales still present. No signs of sig vol overload.  5. Severe MR- Echo suggestive of severe MR, pt had recent echo done at [facility] which also shows MR.   Labs: Basic Metabolic Panel:  Lab 09/19/11 8119 09/18/11 0635 09/17/11 0635  NA 142 141 136  K 4.1 4.0 4.6  CL 105 102 100  CO2 25 26 22   GLUCOSE 101* 93 92  BUN 47* 56* 63*  CREATININE 2.89* 3.44* 3.99*  CALCIUM 9.0 9.0 8.9  ALB -- -- --  PHOS 5.4* 5.5* 5.7*   Liver Function Tests:  Lab 09/19/11 0548 09/18/11 1478 09/17/11 0635 09/15/11 0548 09/14/11 0545 09/12/11 1337  AST -- -- -- 24 24 26   ALT -- -- -- 16 13 13   ALKPHOS -- -- -- 62 61 60  BILITOT -- -- -- 0.2* 0.3 0.5  PROT -- -- -- 6.9 7.0 7.2  ALBUMIN 3.1* 3.2* 3.1* -- -- --   No results found for this basename: LIPASE:3,AMYLASE:3 in the last 168 hours No results found for this basename: AMMONIA:3 in the last 168 hours INR: @resultsinr3 @ CBC:  Lab 09/18/11 0635 09/17/11 0635 09/16/11 0500 09/14/11 0545 09/13/11 0915 09/13/11 0455  WBC 9.4 11.0* 10.5 -- -- --  NEUTROABS -- -- -- -- 7.4 --  HGB 12.5* 11.8* 12.1* -- -- --  HCT 37.3* 34.6* 35.5* -- -- --  MCV 93.7 92.5 92.7 93.4 -- 90.7  PLT 348 307 329 -- -- --   Blood Culture    Component Value Date/Time   SDES SPUTUM 09/14/2011 2124   SDES SPUTUM 09/14/2011 2124   SPECREQUEST Normal 09/14/2011 2124   SPECREQUEST NONE 09/14/2011 2124   CULT NORMAL OROPHARYNGEAL FLORA 09/14/2011 2124   REPTSTATUS 09/14/2011 FINAL 09/14/2011 2124   REPTSTATUS 09/17/2011 FINAL 09/14/2011 2124    Cardiac Enzymes: No results found for this basename: CKTOTAL:5,CKMB:5,CKMBINDEX:5,TROPONINI:5 in the last 168 hours CBG: No results found for this basename: GLUCAP:5 in the last 168 hours Iron Studies: No results found for this basename: IRON,TIBC,TRANSFERRIN,FERRITIN in the last 72 hours  Micro Results: Recent  Results (from the past 240 hour(s))  CULTURE, BLOOD (ROUTINE X 2)     Status: Normal   Collection Time   09/10/11  3:34 PM      Component Value Range Status Comment   Specimen Description BLOOD RIGHT HAND   Final    Special Requests BOTTLES DRAWN AEROBIC AND ANAEROBIC 10CC   Final    Culture  Setup Time [id number]   Final    Culture NO GROWTH 5 DAYS   Final    Report Status 09/16/2011 FINAL   Final   CULTURE, BLOOD (ROUTINE X 2)     Status: Normal   Collection Time   09/10/11  3:35 PM      Component Value Range Status Comment   Specimen Description BLOOD RIGHT ARM   Final    Special Requests BOTTLES DRAWN AEROBIC AND ANAEROBIC 10CC   Final    Culture  Setup Time [id number]   Final    Culture NO GROWTH 5 DAYS   Final    Report Status 09/16/2011 FINAL   Final   CULTURE, SPUTUM-ASSESSMENT     Status: Normal   Collection Time   09/11/11  6:36 PM      Component Value Range Status Comment   Specimen Description SPUTUM   Final    Special Requests NONE   Final    Sputum evaluation     Final    Value: MICROSCOPIC FINDINGS SUGGEST THAT THIS SPECIMEN IS NOT REPRESENTATIVE OF LOWER RESPIRATORY SECRETIONS. PLEASE RECOLLECT.     CALLED TO RN E.SMITH AT 1923 09/11/11 BY L.PITT   Report Status 09/11/2011 FINAL   Final   CULTURE, SPUTUM-ASSESSMENT     Status: Normal   Collection Time   09/14/11  1:38 AM      Component Value Range Status Comment   Specimen Description SPUTUM   Final    Special Requests NONE   Final    Sputum evaluation     Final    Value: MICROSCOPIC FINDINGS SUGGEST THAT THIS SPECIMEN IS NOT REPRESENTATIVE OF LOWER RESPIRATORY SECRETIONS. PLEASE RECOLLECT.     CALLED TO A.BOEHLING,RN 0248 09/14/11 M.CAMPBELL   Report Status 09/14/2011 FINAL   Final   CULTURE, SPUTUM-ASSESSMENT     Status: Normal   Collection Time   09/14/11  9:24 PM      Component Value Range Status Comment   Specimen Description SPUTUM   Final    Special Requests Normal   Final    Sputum evaluation      Final    Value: THIS SPECIMEN IS ACCEPTABLE. RESPIRATORY CULTURE REPORT TO FOLLOW.   Report Status 09/14/2011 FINAL   Final   CULTURE, RESPIRATORY     Status: Normal   Collection Time   09/14/11  9:24 PM  Component Value Range Status Comment   Specimen Description SPUTUM   Final    Special Requests NONE   Final    Gram Stain     Final    Value: RARE WBC PRESENT,BOTH PMN AND MONONUCLEAR     RARE SQUAMOUS EPITHELIAL CELLS PRESENT     NO ORGANISMS SEEN   Culture NORMAL OROPHARYNGEAL FLORA   Final    Report Status 09/17/2011 FINAL   Final    Studies/Results: No results found. Medications: Scheduled Meds:   . aspirin EC  81 mg Oral Daily  . cefTRIAXone (ROCEPHIN)  IV  2 g Intravenous Q24H  . clopidogrel  75 mg Oral Q breakfast  . enoxaparin (LOVENOX) injection  30 mg Subcutaneous Q24H  . metoprolol tartrate  50 mg Oral BID  . nicotine  21 mg Transdermal Daily  . pantoprazole  40 mg Oral Q1200  . sodium chloride  3 mL Intravenous Q12H  . DISCONTD: aspirin EC  325 mg Oral Daily   Continuous Infusions:   . sodium chloride 10 mL/hr (09/15/11 2211)   PRN Meds:.acetaminophen, acetaminophen, albuterol, alum & mag hydroxide-simeth, cyclobenzaprine, docusate sodium, guaiFENesin-dextromethorphan, HYDROcodone-acetaminophen, HYDROmorphone, ipratropium, levalbuterol, ondansetron (ZOFRAN) IV, ondansetron   This is a Psychologist, occupational Note.  The care of the patient was discussed with Dr.Sandrika Schwinn, and the assessment and plan formulated with their assistance.  Please see their attached note for official documentation of the daily encounter.  Morrell Riddle PA-S2 09/19/2011, 7:30 AM  Patient seen and examined, agree with above note with above modifications.  Annie Sable, MD 09/19/2011

## 2011-09-19 NOTE — Progress Notes (Signed)
Physical Therapy Treatment Patient Details Name: Charles Savage MRN: [id number] DOB: 1955-07-06 Today's Date: 09/19/2011  PT Assessment/Plan  PT - Assessment/Plan Comments on Treatment Session: Safe to return home.  Will need home O2, see qualification progress note. PT Plan: Discharge plan remains appropriate Follow Up Recommendations: No PT follow up Equipment Recommended: None recommended by PT PT Goals  Acute Rehab PT Goals PT Goal Formulation: With patient PT Transfer Goal: Bed to Chair/Chair to Bed - Progress: Met PT Goal: Ambulate - Progress: Met  PT Treatment Precautions/Restrictions  Precautions Precautions: Fall Required Braces or Orthoses: No Restrictions Weight Bearing Restrictions: No Mobility (including Balance) Bed Mobility Supine to Sit: 7: Independent Sitting - Scoot to Edge of Bed: 7: Independent Sit to Supine: 7: Independent Transfers Transfers: Yes Sit to Stand: 7: Independent Stand to Sit: 7: Independent Ambulation/Gait Ambulation/Gait: Yes Ambulation/Gait Assistance: 7: Independent Ambulation/Gait Assistance Details (indicate cue type and reason): Generally steady even given challenges to balance incl. significant changes in gait speed, quick turns, backing up scanning in ~270 degree arc, stepping over objects Ambulation Distance (Feet): 550 Feet (or more) Assistive device: None (or pushing the IV pole) Gait Pattern: Within Functional Limits Gait velocity: able to speed up to a community level (subjectively) Stairs: No  Posture/Postural Control Posture/Postural Control: No significant limitations Balance Balance Assessed:  (generally steady) Exercise    End of Session PT - End of Session Activity Tolerance: Patient tolerated treatment well Patient left: in chair;with call bell in reach General Behavior During Session: [facility] for tasks performed Cognition: [facility] for tasks performed  Johnnye Sandford, Eliseo Gum 09/19/2011, 10:54 AM  09/19/2011  [place] Bing, PT [phone] (pager)

## 2011-09-20 LAB — BASIC METABOLIC PANEL
BUN: 39 mg/dL — ABNORMAL HIGH (ref 6–23)
Calcium: 8.9 mg/dL (ref 8.4–10.5)
Chloride: 100 mEq/L (ref 96–112)
Creatinine, Ser: 2.6 mg/dL — ABNORMAL HIGH (ref 0.50–1.35)
GFR calc Af Amer: 30 mL/min — ABNORMAL LOW (ref >90)

## 2011-09-20 MED ORDER — METOPROLOL TARTRATE 50 MG PO TABS: 50.0000 mg | ORAL_TABLET | Freq: Two times a day (BID) | ORAL | Status: DC

## 2011-09-20 MED ORDER — CLOPIDOGREL BISULFATE 75 MG PO TABS: 75.0000 mg | ORAL_TABLET | Freq: Every day | ORAL | Status: DC

## 2011-09-20 MED ORDER — ASPIRIN 81 MG PO TBEC: 81.0000 mg | DELAYED_RELEASE_TABLET | Freq: Every day | ORAL | Status: DC

## 2011-09-20 MED ORDER — ALBUTEROL SULFATE HFA 108 (90 BASE) MCG/ACT IN AERS: 2.0000 | INHALATION_SPRAY | Freq: Four times a day (QID) | RESPIRATORY_TRACT | Status: DC | PRN

## 2011-09-20 MED ORDER — ENOXAPARIN SODIUM 40 MG/0.4ML ~~LOC~~ SOLN
40.0000 mg | SUBCUTANEOUS | Status: DC
  Filled 2011-09-20: qty 0.4

## 2011-09-20 NOTE — Progress Notes (Signed)
Subjective:  Overall feels  better. No chest pain, less purulent sputum. Still requiring oxygen for desat.  CXR and CT results reviewed.  Objective:  Vital Signs in the last 24 hours: BP 112/76  Pulse 93  Temp(Src) 97.5 F (36.4 C) (Oral)  Resp 18  Ht 5\' 8"  (1.727 m)  Wt 92 kg (202 lb 13.2 oz)  BMI 30.84 kg/m2  SpO2 95%  Physical Exam: Pleasant obese WM in NAD Lungs: Rales post lung L>R but improved from Friday Cardiac:  Rapid regular rhythm, normal S1 and S2, ? S4, again, no murmur heard today. Extremities:  No edema present  Lab Results: Basic Metabolic Panel:  Basename 09/20/11 0540 09/19/11 0548  NA 137 142  K 4.0 4.1  CL 100 105  CO2 27 25  GLUCOSE 109* 101*  BUN 39* 47*  CREATININE 2.60* 2.89*   CBC:  Basename 09/18/11 0635  WBC 9.4  NEUTROABS --  HGB 12.5*  HCT 37.3*  MCV 93.7  PLT 348   Telemetry: Sinus rhythm and tachycardia  Assessment/Plan:  1. Acute renal failure - nephrology currently seeing.  Currently plateaued 2. Mild CHF with EF 40-45% 3. Abnormal EKG c/w ischemia CAD by coronary calcification seen on CT He will need assessment when over pneumonia 4.  Pneumonia under treatment improving but still with abnormal CXR and ST scan  Rec:  1. He has definite CAD and will need eval when over pneumonia and renal failure.  On Plavix now and low dose beta blocker. 2. Awaiting improvement in other ways.  Darden Palmer  MD [facility]  09/20/2011, 9:19 AM

## 2011-09-20 NOTE — Discharge Instructions (Signed)
Heart Failure Heart failure (HF) means your heart has trouble pumping blood. The blood is not circulated very well in your body because your heart is weak. HF may cause blood to back up into your lungs. This is commonly called "fluid in the lungs." HF may also cause your ankles and legs to puff up (swell). It is important to take good care of yourself when you have HF. HOME CARE Medicine  Take your medicine as told by your doctor.   Do not stop taking your medicine unless told to by your doctor.   Be sure to get your medicine refilled before it runs out.   Do not skip any doses of medicine.   Tell your doctor if you cannot afford your medicine.   Keep a list of all the medicine you take. This should include the name, how much you take, and when you take it.   Ask your doctor if you have any questions about your medicine. Do not take over-the-counter medicine unless your doctor says it is okay.  What you eat  Do not drink alcohol unless your doctor says it is okay.   Avoid food that is high in fat. Avoid foods fried in oil or made with fat.   Eat a healthy diet. A dietitian can help you with healthy food choices.   Limit how much salt you eat. Do not eat more than 1500 milligrams (mg) of salt (sodium) a day.   Do not add salt to your food.   Do not eat food made with a lot of salt. Here are some examples:   Canned vegetables.   Canned soups.   Canned drinks.   Hot dogs.   Fast food.   Pizza.   Chips.  Check your weight  Weigh yourself every morning. You should do this after you pee (urinate) and before you eat breakfast.   Wear the same amount of clothes each time you weigh yourself.   Write down your weight every day. Tell your doctor if you gain 3 lb/1.4 kg or more in 1 day or 5 lb/2.3 kg in a week.  Blood pressure monitoring  Buy a home blood pressure cuff.   Check your blood pressure as told by your doctor. Write down your blood pressure numbers on a sheet  of paper.   Bring your blood pressure numbers to your doctor visits.  Smoking  Smoking is bad for your heart.   Ask your doctor how to stop smoking.  Exercise  Talk to your doctor about exercise.   Ask how much exercise is right for you.   Exercise as much as you can. Stop if you feel tired, have problems breathing, or have chest pain.  Keep all your doctor appointments. GET HELP RIGHT AWAY IF:   You have trouble breathing.   You have a cough that does not go away.   You cannot sleep because you have trouble breathing.   You gain 3 lb/1.4 kg or more in 1 day or 5 lb/2.3 kg in a week.   You have puffy ankles or legs.   You have an enlarged (bloated) belly (abdomen).   You pass out (faint).   You have really bad chest pain or pressure. This includes pain or pressure in your:   Arms.   Jaw.   Neck.   Back.  If you have any of the above problems, call your local emergency services (911 in [place] Do not drive yourself to the hospital. MAKE  SURE YOU:   Understand these instructions.   Will watch your condition.   Will get help right away if you are not doing well or get worse.  Document Released: 03/21/2008 Document Revised: 06/01/2011 Document Reviewed: 10/13/2008 [facility] Patient Information 2012 [place], [place].Pneumonia, Adult Pneumonia is an infection of the lungs. It may be caused by a germ (virus or bacteria). Some types of pneumonia can spread easily from person to person. This can happen when you cough or sneeze. HOME CARE  Only take medicine as told by your doctor.   Take your medicine (antibiotics) as told. Finish it even if you start to feel better.   Do not smoke.   You may use a vaporizer or humidifier in your room. This can help loosen thick spit (mucus).   Sleep so you are almost sitting up (semi-upright). This helps reduce coughing.   Rest.  A shot (vaccine) can help prevent pneumonia. Shots are often advised for:  People over 44 years  old.   Patients on chemotherapy.   People with long-term (chronic) lung problems.   People with immune system problems.  GET HELP RIGHT AWAY IF:   You are getting worse.   You cannot control your cough, and you are losing sleep.   You cough up blood.   Your pain gets worse, even with medicine.   You have a fever.   Any of your problems are getting worse, not better.   You have shortness of breath or chest pain.  MAKE SURE YOU:   Understand these instructions.   Will watch your condition.   Will get help right away if you are not doing well or get worse.  Document Released: 11/29/2007 Document Revised: 06/01/2011 Document Reviewed: 09/02/2010 [facility] Patient Information 2012 [place], [place].Uremia (Kidney Failure) Uremia is a condition resulting from kidney (renal) failure. Kidney failure means that the kidneys are functioning dangerously below the normal level. As a result:  Toxins can accumulate in your blood.   Certain electrolytes can be dangerously high or low.   Fluids can accumulate in your lung.  The build up of toxins in the body causing outward clinical harm due to kidney failure is called uremia. When you have uremia, you are said to be in clinical kidney failure. Kidney function may be measured by evaluating your Blood Urea Nitrogen (BUN) and Creatinine (Cr). When these values become higher than normal, this is called azotemia. One can have azotemia without symptoms of uremia. If untreated, kidney failure leads to increasing buildup of toxic substances, severe electrolyte imbalance, and dangerous fluid overloading in the lungs, and eventually leads to death. CAUSES   Common diseases that start in the kidney. These include:   Glomerulonephritis.   Interstitial disease.   Recurrent infection.   Other diseases that can affect and harm the kidney.   Untreated diabetes.   Untreated high blood pressure.   Lupus.   Acute (comes on suddenly) causes of  kidney failure include:   Certain medicines (ibuprofen, certain antibiotics).   Severe vomiting and or diarrhea causing dehydration.   Severe life threatening infections (such as sepsis).   Severe burns.   Acute loss of blood volume (from shock for example).  TYPES There are 2 main forms of renal failure. It is divided into acute renal failure, and chronic renal failure (which comes on slowly and sometimes over a long time and to an extent is non-reversible). 1. Acute renal failure (ARF) can show itself early with a decrease in urine production (  oliguria). The cause must be immediately found to stop the progress. Many times the cause is reversible (such as certain medicines that can be toxic to the kidneys, or dehydration where the kidney does not receive enough blood because the body is so dry). In some instances dialysis may be necessary until the underlying cause(s) is treated. Most causes of ARF will occur over a few days.  2. Chronic renal failure (CRF) gives few symptoms initially. It can be the complication of a variety of kidney diseases. It can also result from untreated acute renal failure. Chronic renal failure is usually progressive and unlike ARF, loss of kidney function is usually permanent. End-stage renal disease (ESRD) is the ultimate outcome. When this happens, dialysis is usually required. In certain cases a kidney transplant may be necessary.  SYMPTOMS  Symptoms of kidney failure can depend on how rapidly the failure comes on and how severe it is. They include:  Nausea.   Confusion.   Headache.   Change in mental status.   Vomiting.   Sleepiness.   Loss of energy.   Loss of appetite.   Seizures.   Sore mouth.   Shortness of breath.   Chest pain.   Fainting.   Swollen extremities.   Dehydration.   Lack of or decreased urine output.  DIAGNOSIS  Your caregiver may first suspect kidney failure by evaluating your risks, symptoms, and physical  presentation. A diagnosis may be aided by doing a screening urinalysis and blood tests. Sometimes more specific blood tests, X-rays, and other imaging tests (such as ultrasound) can help to narrow down the exact reason for the kidney failure. TREATMENT  The treatment of renal failure depends on the cause. With acute renal failure treating the underlying cause can be effective (hydration to restore blood flow to the kidneys for example). If treatment is not successful, kidney dialysis can be used to clean the blood and prevent many of the harmful effects of these toxins on the body. Dialysis is also important to maintain a regular fluid status in patients, so they do not develop fluid in the lungs. Many people successfully undergo dialysis for many years. In some cases, a kidney transplant may be suggested. Kidney transplants are one of the most successful of all organ transplants. SEEK MEDICAL CARE IF:   You develop unexplained weakness, tiredness, or appetite loss.   You feel poorly with no clear explanation.  SEEK IMMEDIATE MEDICAL CARE IF:   The amount of urine you produce either distinctly increases or decreases.   You develop swelling of the face and/or ankles.   You develop shortness of breath.  Document Released: 06/12/2005 Document Revised: 06/01/2011 Document Reviewed: 11/15/2009 [facility] Patient Information 2012 [place], [place].

## 2011-09-20 NOTE — Discharge Summary (Signed)
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 He also developed AKI- secondary to ?ATN. Patient had been seen by cardiology/renal/PCCM. Cardiac cath deferred due to AKI/acute respiratory failure. Patient has completed 10 days of abx per pulmonary recommendation, and still needs home oxygen. He otherwise feels better, and his renal function continues to improve. Ct chest without contrast suggested PNA with small pleural effusions. He will need to follow with cardiology(Dr Donnie Aho in 2-3 weeks- spoke with Dr Donnie Aho). He will d/c on home oxygen for now. Should see his PCP in 1 week to follow renal function. Patient not discharged on ACEI/statin due to Acute kidney injury.   Medication List  As of 09/20/2011  1:05 PM   STOP taking these medications         meloxicam 15 MG tablet      pravastatin 20 MG tablet         TAKE these medications         albuterol 108 (90 BASE) MCG/ACT inhaler   Commonly known as: PROVENTIL HFA;VENTOLIN HFA   Inhale 2 puffs into the lungs every 6 (six) hours as needed for wheezing.      aspirin 81 MG EC tablet   Take 1 tablet (81 mg  total) by mouth daily.      clopidogrel 75 MG tablet   Commonly known as: PLAVIX   Take 1 tablet (75 mg total) by mouth daily with breakfast.      cyclobenzaprine 10 MG tablet   Commonly known as: FLEXERIL   Take 10 mg by mouth 3 (three) times daily as needed.      HYDROcodone-acetaminophen 10-325 MG per tablet   Commonly known as: NORCO   Take 1 tablet by mouth every 8 (eight) hours as needed. Pain      metoprolol 50 MG tablet   Commonly known as: LOPRESSOR   Take 1 tablet (50 mg total) by mouth 2 (two) times daily.             Day of Discharge BP 127/83  Pulse 105  Temp(Src) 97.5 F (36.4 C) (Oral)  Resp 18  Ht 5\' 8"  (1.727 m)  Wt 92 kg (202 lb 13.2 oz)  BMI 30.84 kg/m2  SpO2 95%  Physical Exam: Comfortable, some scant rhonchi bilaterally.  Results for orders placed during the hospital encounter of 09/10/11 (from the past 24 hour(s))  BASIC METABOLIC PANEL     Status: Abnormal   Collection Time   09/20/11  5:40 AM      Component Value Range   Sodium 137  135 - 145 (mEq/L)   Potassium 4.0  3.5 - 5.1 (mEq/L)   Chloride 100  96 - 112 (mEq/L)   CO2 27  19 - 32 (mEq/L)   Glucose, Bld 109 (*) 70 - 99 (mg/dL)   BUN 39 (*) 6 -  23 (mg/dL)   Creatinine, Ser 8.46 (*) 0.50 - 1.35 (mg/dL)   Calcium 8.9  8.4 - 96.2 (mg/dL)   GFR calc non Af Amer 26 (*) >90 (mL/min)   GFR calc Af Amer 30 (*) >90 (mL/min)    Disposition: home today.   Follow-up Appts: Discharge Orders    Future Orders Please Complete By Expires   Diet - low sodium heart healthy      Increase activity slowly         Follow-up Information    Follow up with Colette Ribas, MD in 1 week.      Follow up with TILLEY JR,W SPENCER, MD in 2 weeks.   Contact information:   [address] [place] [address] [phone]          Tests Needing Follow-up: Bmp.  Time spent in discharge (includes decision making & examination of pt): 40  minutes  Signed: Eddy Termine 09/20/2011, 1:05 PM

## 2011-09-20 NOTE — Progress Notes (Signed)
Name: Charles Savage MRN: [id number] DOB: 01-17-1956    LOS: 10 Requesting WG:[id number] Regarding: Hypoxia   [place] [facility]    History of Present Illness: Charles Savage is a pleasant 56 yo wm,2-3 ppd smoker since age 80 to date of admission. He was admitted on 3/17 with 3 days of coughing up chunks of green/gray sputum, fever and chest pain. He had elevated cardiac enzymes and was evaluated by cardiology with full outpatient cardiac work up planned in the future. He reports his fever/chills and sputum production have improved but he remains O2 dependent. CxR questions pneumonia.  Note he has 2 strokes in 2011 , one prior to left carotid endarectomy and one post at [facility]. PCCM asked to evaluate hypoxia.     Cultures: 3/21 sputum>>neg 3/21 bc x 2 >>neg  Antibiotics: 3/20 roc(aecopd)>> 3/26  Tests / Events:  2/25 2 d ------------------------------------------------------------ Left ventricle: The cavity size was normal. Systolic function was mildly to moderately reduced. The estimated ejection fraction was in the range of 40% to 45%. Moderate diffuse hypokinesis. Regional wall motion abnormalities: Severe hypokinesis of the inferior myocardium.  ------------------------------------------------------------ Aortic valve: Trileaflet; normal thickness leaflets. Mobility was not restricted. Doppler: Transvalvular velocity was within the normal range. There was no stenosis. Mild regurgitation.  ------------------------------------------------------------ Aorta: Aortic root: The aortic root was normal in size.  ------------------------------------------------------------ Mitral valve: Structurally normal valve. Mobility was not restricted. Doppler: Transvalvular velocity was within the normal range. There was no evidence for stenosis. Severe regurgitation directed eccentrically. Peak gradient: 6mm  Hg (D).  ------------------------------------------------------------ Left atrium: The atrium was normal in size.  ------------------------------------------------------------ Right ventricle: The cavity size was moderately dilated. Wall thickness was normal. Systolic function was normal.  ------------------------------------------------------------ Pulmonic valve: Doppler: Transvalvular velocity was within the normal range. There was no evidence for stenosis.  ------------------------------------------------------------ Tricuspid valve: Structurally normal valve. Doppler: Transvalvular velocity was within the normal range. No regurgitation.  ------------------------------------------------------------ Pulmonary artery: The main pulmonary artery was normal-sized. Systolic pressure was mildly increased.  ------------------------------------------------------------ Right atrium: The atrium was normal in size.  ------------------------------------------------------------ Pericardium: A small pericardial effusion was identified.  ------------------------------------------------------------ Systemic veins: Inferior vena cava: The vessel was normal in size.  ------------------------------------------------------------    Vital Signs: Temp:  [97.2 F (36.2 C)-97.5 F (36.4 C)] 97.5 F (36.4 C) (03/27 0500) Pulse Rate:  [92-105] 93  (03/27 0500) Resp:  [18-20] 18  (03/27 0500) BP: (112-125)/(70-84) 112/76 mmHg (03/27 0500) SpO2:  [75 %-95 %] 95 % (03/27 0735) I/O last 3 completed shifts: In: 1005 [P.O.:720; Other:85; IV Piggyback:200] Out: 850 [Urine:850]  Physical Examination: General:  wnwdwmnad Neuro:  intact   HEENT:  Old lt CEA scar noted. No JVD Cardiovascular:  hsr rrr Lungs:  Decreased in bases Abdomen:  obese Musculoskeletal:  intact Skin:  warm       Labs and Imaging:  Dg Chest 2 View  09/19/2011  *RADIOLOGY REPORT*  Clinical Data: Short of breath.   Pneumonia  CHEST - 2 VIEW  Comparison: 09/13/2011  Findings: Right upper lobe infiltrate shows mild progression. Patchy bibasilar airspace disease is unchanged.  Bilateral pleural effusions, with  progression on the left.  Underlying COPD.  IMPRESSION: Mild progression right upper lobe infiltrate.  This is suspicious for pneumonia.  Bibasilar atelectasis/infiltrate is unchanged.  Bilateral pleural effusions, with progression of the left pleural effusion.  Original Report Authenticated By: Camelia Phenes, M.D.   Ct Chest Wo Contrast  09/19/2011  *RADIOLOGY REPORT*  Clinical Data: Evaluate pneumonia.  Chest pain.  Short of breath.  CT CHEST WITHOUT CONTRAST  Technique:  Multidetector CT imaging of the chest was performed following the standard protocol without IV contrast.  Comparison: 09/19/2011 radiograph.  CT chest 02/16/2011.  Findings: Age advanced severe coronary artery atherosclerosis is present. If office based assessment of coronary risk factors has not been performed, it is now recommended.  Incidental imaging of the abdomen demonstrates that nodular contour of the liver suggesting hepatic cirrhosis.  Old granulomatous disease of the liver and cholelithiasis is also noted.  Moderate right greater than left bilateral pleural effusions are present which layer dependently.  The lungs demonstrate both paraseptal and centrilobular emphysema. Mild enlargement of the heart with a tiny pericardial effusion.  There is no axillary adenopathy.  Mild mediastinal adenopathy is present with enlargement of low right paratracheal lymph node that continues to demonstrate a normal fatty hilum.  This is probably congestive or reactive.  Bilateral upper lobe airspace disease is present compatible with multi focal pneumonia. Dense consolidation in the right upper lobe with air bronchograms.  Airspace opacification is multi focal. Collapse / consolidation of both lower lobes associated with pleural effusions.  Calcified  granuloma are present in the lower lobes bilaterally.  There are no aggressive osseous lesions identified.  Mild thoracic spondylosis.  IMPRESSION: 1.  Moderate bilateral right greater than left pleural effusions layering dependently with associated compressive atelectasis. 2.  Emphysema with bilateral upper lobe airspace disease most consistent with pneumonia; in a patient with prior CT evidence of interstitial lung disease, this could represent acute interstitial pneumonitis however pneumonia is favored. 3.  Mild cardiomegaly and age advanced atherosclerotic coronary artery disease. 4.  Old granulomatous disease. 5.  Probable hepatic cirrhosis. 6.  Cholelithiasis. 7.  Right middle lobe pulmonary nodule noted on prior exam 02/16/2011 not well visualized.  Original Report Authenticated By: Andreas Newport, M.D.    Lab 09/20/11 0540 09/19/11 0548 09/18/11 0635  NA 137 142 141  K 4.0 4.1 4.0  CL 100 105 102  CO2 27 25 26   BUN 39* 47* 56*  CREATININE 2.60* 2.89* 3.44*  GLUCOSE 109* 101* 93    Lab 09/18/11 0635 09/17/11 0635 09/16/11 0500  HGB 12.5* 11.8* 12.1*  HCT 37.3* 34.6* 35.5*  WBC 9.4 11.0* 10.5  PLT 348 307 329   ABG    Component Value Date/Time   PHART 7.415 09/10/2011 0800   PCO2ART 36.3 09/10/2011 0800   PO2ART 63.7* 09/10/2011 0800   HCO3 22.8 09/10/2011 0800   TCO2 19.1 09/10/2011 0800   ACIDBASEDEF 1.1 09/10/2011 0800   O2SAT 91.7 09/10/2011 0800    Intake/Output Summary (Last 24 hours) at 09/20/11 1003 Last data filed at 09/19/11 2000  Gross per 24 hour  Intake    765 ml  Output    400 ml  Net    365 ml    Assessment and Plan: Hypoxia in setting of 3 ppd smoker since age 71, recent presumed pna, + cardiac enzymes , cardiac dysfunction, deconditioning  secondary to chronic back pain and narcotic uses.  -O2 as needed. He may need home O2 until residual affects  of pna have resolved, He may be O2 dependent at baseline. -BD's with continued use as outpatient -Pulmonary  toilet -?diuresis with bnp 4819 -stop smoking  -ct of chest-> see rads  + cardiac enzymes and chf  -per cards  Chronic pain - per IM team Best practices / Disposition: > discharge plans per triad, remains with adequate sats on 3lpm NP      Brett Canales Minor ACNP [facility] Pager [phone] till 3 pm If no answer page [phone] 09/20/2011, 10:00 AM  Pt independently  seen and examined and available cxr's reviewed and I agree with above findings/ imp/ plan  Sandrea Hughs, MD Pulmonary and Critical Care Medicine [facility] Cell [phone]

## 2011-09-20 NOTE — Progress Notes (Signed)
Pt discharged to home per MD order. Pt alert and oriented with no complaints. Pt and family received all discharge instructions, education and medication information, including follow-up appointments and prescription information.  Pt received heart failure booklet. Efraim Kaufmann

## 2011-09-25 DIAGNOSIS — F32A Depression, unspecified: Secondary | ICD-10-CM

## 2011-09-25 HISTORY — PX: CARDIAC CATHETERIZATION: SHX172

## 2011-09-25 HISTORY — DX: Depression, unspecified: F32.A

## 2011-09-26 ENCOUNTER — Emergency Department (HOSPITAL_COMMUNITY): Payer: Medicaid Other

## 2011-09-26 ENCOUNTER — Other Ambulatory Visit: Payer: Self-pay

## 2011-09-26 ENCOUNTER — Encounter (HOSPITAL_COMMUNITY): Payer: Self-pay | Admitting: Emergency Medicine

## 2011-09-26 ENCOUNTER — Emergency Department (HOSPITAL_COMMUNITY)
Admission: EM | Admit: 2011-09-26 | Discharge: 2011-09-27 | Disposition: A | Discharge status: Home / Self Care | Payer: Medicaid Other | Attending: Emergency Medicine | Admitting: Emergency Medicine

## 2011-09-26 DIAGNOSIS — R059 Cough, unspecified: Secondary | ICD-10-CM | POA: Insufficient documentation

## 2011-09-26 DIAGNOSIS — M549 Dorsalgia, unspecified: Secondary | ICD-10-CM | POA: Insufficient documentation

## 2011-09-26 DIAGNOSIS — E785 Hyperlipidemia, unspecified: Secondary | ICD-10-CM | POA: Insufficient documentation

## 2011-09-26 DIAGNOSIS — R05 Cough: Secondary | ICD-10-CM | POA: Insufficient documentation

## 2011-09-26 DIAGNOSIS — Z8679 Personal history of other diseases of the circulatory system: Secondary | ICD-10-CM | POA: Insufficient documentation

## 2011-09-26 DIAGNOSIS — G8929 Other chronic pain: Secondary | ICD-10-CM | POA: Insufficient documentation

## 2011-09-26 DIAGNOSIS — R0602 Shortness of breath: Secondary | ICD-10-CM | POA: Insufficient documentation

## 2011-09-26 DIAGNOSIS — J449 Chronic obstructive pulmonary disease, unspecified: Secondary | ICD-10-CM

## 2011-09-26 DIAGNOSIS — J4489 Other specified chronic obstructive pulmonary disease: Secondary | ICD-10-CM | POA: Insufficient documentation

## 2011-09-26 LAB — DIFFERENTIAL
Basophils Absolute: 0.1 10*3/uL (ref 0.0–0.1)
Basophils Relative: 1 % (ref 0–1)
Eosinophils Relative: 1 % (ref 0–5)
Lymphocytes Relative: 25 % (ref 12–46)
Monocytes Absolute: 0.9 10*3/uL (ref 0.1–1.0)
Neutro Abs: 8.2 10*3/uL — ABNORMAL HIGH (ref 1.7–7.7)

## 2011-09-26 LAB — CBC
HCT: 38.1 % — ABNORMAL LOW (ref 39.0–52.0)
MCHC: 32.8 g/dL (ref 30.0–36.0)
Platelets: 340 10*3/uL (ref 150–400)
RDW: 14.4 % (ref 11.5–15.5)
WBC: 12.5 10*3/uL — ABNORMAL HIGH (ref 4.0–10.5)

## 2011-09-26 MED ORDER — ALBUTEROL SULFATE (5 MG/ML) 0.5% IN NEBU
2.5000 mg | INHALATION_SOLUTION | RESPIRATORY_TRACT | Status: AC
  Administered 2011-09-26: 2.5 mg via RESPIRATORY_TRACT
  Filled 2011-09-26: qty 0.5

## 2011-09-26 MED ORDER — IPRATROPIUM BROMIDE 0.02 % IN SOLN
0.5000 mg | Freq: Once | RESPIRATORY_TRACT | Status: AC
  Administered 2011-09-26: 0.5 mg via RESPIRATORY_TRACT
  Filled 2011-09-26: qty 2.5

## 2011-09-26 MED ORDER — SODIUM CHLORIDE 0.9 % IV SOLN
INTRAVENOUS | Status: DC
  Administered 2011-09-27: via INTRAVENOUS

## 2011-09-26 NOTE — ED Notes (Signed)
Pt DC from [facility] on last Wed for Pneumonia.  Pt feeling SOB

## 2011-09-26 NOTE — ED Notes (Signed)
Patient states he has had shortness of breath since 1200 this afternoon. Has had trouble catching her breath. Is breathing 40x\minute, labored, shallow. Diminished lung sounds in all fields. Denies any chest pain. Skin warm, dry, grey in color. Denies nausea, vomiting, diarrhea. Normal bowel sounds in all fields. No tenderness. Abdomen soft. Good skin turgor. sats between 84%-89% on 2L O2 [place]. Increased to 3L. Sats maintaining between 93-95%. States oxygen is somewhat making him feel better. Awaiting MD eval.

## 2011-09-26 NOTE — ED Notes (Addendum)
RT at bedside for breathing treatment. Patient breathing 36x\minute, shallow, labored. Denies having any pain. Denies any needs at this time. Call bell and wife at bedside. Bed in low position and locked with side rails up. Will continue to monitor. Sats 95% O2 3L [place].

## 2011-09-26 NOTE — ED Provider Notes (Addendum)
History  This chart was scribed for Shelda Jakes, MD by Bennett Scrape. This patient was seen in room APA02/APA02 and the patient's care was started at 11:11PM.  CSN: [id number]  Arrival date & time 09/26/11  2236   First MD Initiated Contact with Patient 09/26/11 2305      Chief Complaint  Patient presents with  . Shortness of Breath     Patient is a 56 y.o. male presenting with shortness of breath. The history is provided by the patient. No language interpreter was used.  Shortness of Breath  The current episode started today. The onset was gradual. The problem occurs continuously. The problem has been gradually worsening. The problem is mild. The symptoms are relieved by nothing. The symptoms are aggravated by nothing. Associated symptoms include cough and shortness of breath. Pertinent negatives include no chest pain, no fever, no rhinorrhea, no sore throat and no wheezing. His past medical history does not include asthma. There were no sick contacts. Recently, medical care has been given at another facility. Services received include medications given.    Charles Savage is a 56 y.o. male who presents to the Emergency Department complaining of 12 hours of gradual onset, gradually worsening, constant SOB that started after lunch today. He denies any modifying factors. Pt seen in this ED and diagnosed with pneumonia. He was admitted to [facility] on 09/10/11. Pt had a MI in this ED before transfer to [facility]. He states that he hasn't had a heart cath yet, because he is still experiencing symptoms. He c/o cough productive of some phlegm as an associated symptom but states that it is better than before he was admitted. Pt states that he was discharged home with an inhaler. He reports that it helps temporarily. The last time he used it around was around10PM tonight. He denies having any prior regular basis inhaler use. Pt denies fever, chest pain, nausea, emesis, diarrhea, sore throat,  rhinorrhea, neck pain, back pain, rash, dysuria, and leg swelling as associated symptoms.  Pt reports that he is on 2L of O2 at home.  Pt has a h/o chronic back pain and stroke. He is a current everyday smoker but denies alcohol use.   Dr. Donnie Aho is Cardiologist. Dr. Genia Hotter is PCP.   Past Medical History  Diagnosis Date  . Chronic pain     back pain  . Stroke   . Hyperlipemia   . Lumbar disc disease   . Carotid artery disease     Past Surgical History  Procedure Date  . Back surgery 4098,[id number]  . Knee arthroscopy 2002    rt  . Foreign body removal 06/05/2011    Procedure: FOREIGN BODY REMOVAL ADULT;  Surgeon: Rosalio Macadamia;  Location: [facility];  Service: Plastics;  Laterality: Right;  removal foreign body from right side face   . Carotid endarterectomy     No family history on file.  History  Substance Use Topics  . Smoking status: Current Everyday Smoker -- 2.0 packs/day for 30 years    Types: Cigarettes  . Smokeless tobacco: Not on file  . Alcohol Use: No      Review of Systems  Constitutional: Negative for fever and chills.  HENT: Negative for sore throat, rhinorrhea and neck pain.   Eyes: Negative for pain.  Respiratory: Positive for cough and shortness of breath. Negative for wheezing.   Cardiovascular: Negative for chest pain.  Gastrointestinal: Negative for nausea, vomiting, abdominal pain and diarrhea.  Genitourinary: Negative for dysuria, urgency and hematuria.  Musculoskeletal: Negative for back pain.  Skin: Negative for rash.  Neurological: Negative for seizures and headaches.  Hematological: Does not bruise/bleed easily.  Psychiatric/Behavioral: Negative for confusion.    Allergies  Morphine and related and Other  Home Medications   Current Outpatient Rx  Name Route Sig Dispense Refill  . ALBUTEROL SULFATE HFA 108 (90 BASE) MCG/ACT IN AERS Inhalation Inhale 2 puffs into the lungs every 6 (six) hours as needed for wheezing.  1 Inhaler 2  . ALBUTEROL SULFATE HFA 108 (90 BASE) MCG/ACT IN AERS Inhalation Inhale 1-2 puffs into the lungs every 6 (six) hours as needed for wheezing. 1 Inhaler 0  . ASPIRIN 81 MG PO TBEC Oral Take 1 tablet (81 mg total) by mouth daily. 30 tablet 0  . CLOPIDOGREL BISULFATE 75 MG PO TABS Oral Take 1 tablet (75 mg total) by mouth daily with breakfast. 30 tablet 0  . CYCLOBENZAPRINE HCL 10 MG PO TABS Oral Take 10 mg by mouth 3 (three) times daily as needed.      Marland Kitchen HYDROCODONE-ACETAMINOPHEN 10-325 MG PO TABS Oral Take 1 tablet by mouth every 8 (eight) hours as needed. Pain    . METOPROLOL TARTRATE 50 MG PO TABS Oral Take 1 tablet (50 mg total) by mouth 2 (two) times daily. 60 tablet 0  . PREDNISONE 10 MG PO TABS Oral Take 4 tablets (40 mg total) by mouth daily. 20 tablet 0    Triage Vitals: BP 115/86  Pulse 102  Temp(Src) 97.3 F (36.3 C) (Oral)  Resp 22  Ht 5\' 8"  (1.727 m)  Wt 190 lb (86.183 kg)  BMI 28.89 kg/m2  SpO2 98%  Physical Exam  Nursing note and vitals reviewed. Constitutional: He is oriented to person, place, and time. He appears well-developed and well-nourished.  HENT:  Head: Normocephalic and atraumatic.       Moist mucous membranes  Neck: Normal range of motion. Neck supple.  Cardiovascular: Regular rhythm.  Tachycardia present.   Pulmonary/Chest: Effort normal. No respiratory distress. He has no wheezes.       Dimished bilaterally  Musculoskeletal: Normal range of motion. He exhibits no edema (No lower leg swelling).  Lymphadenopathy:    He has no cervical adenopathy.  Neurological: He is alert and oriented to person, place, and time.  Skin: Skin is warm and dry.  Psychiatric: He has a normal mood and affect. His behavior is normal.    ED Course  Procedures (including critical care time)  DIAGNOSTIC STUDIES: Oxygen Saturation is:  84-88% on 2L of O2,  93-95% on 3L of O2  COORDINATION OF CARE: 11:16PM-Pt states that he is feeling better after the increased  from 2L to 3L. Discussed chest x-ray and blood work as treatment plan with pt and pt agreed to plan.   Labs Reviewed  CBC - Abnormal; Notable for the following:    WBC 12.5 (*)    RBC 4.04 (*)    Hemoglobin 12.5 (*)    HCT 38.1 (*)    All other components within normal limits  DIFFERENTIAL - Abnormal; Notable for the following:    Neutro Abs 8.2 (*)    All other components within normal limits  COMPREHENSIVE METABOLIC PANEL - Abnormal; Notable for the following:    Chloride 95 (*)    Glucose, Bld 129 (*)    BUN 31 (*)    Creatinine, Ser 1.95 (*)    GFR calc non Af Amer 37 (*)  GFR calc Af Amer 43 (*)    All other components within normal limits  PRO B NATRIURETIC PEPTIDE - Abnormal; Notable for the following:    Pro B Natriuretic peptide (BNP) 17216.0 (*)    All other components within normal limits  BLOOD GAS, ARTERIAL - Abnormal; Notable for the following:    pO2, Arterial 65.2 (*)    Bicarbonate 24.1 (*)    All other components within normal limits  TROPONIN I   Dg Chest Portable 1 View  09/26/2011  *RADIOLOGY REPORT*  Clinical Data: Shortness of breath.  PORTABLE CHEST - 1 VIEW  Comparison: 09/19/2011  Findings: Shallow inspiration.  Small bilateral pleural effusions with basilar atelectasis.  Focal consolidation in the right upper lung with volume loss.  Cardiac enlargement with normal pulmonary vascularity.  Stable appearance since previous study.  No pneumothorax.  IMPRESSION: Bilateral pleural effusions with basilar atelectasis. Consolidation in the right upper lung.  Cardiac enlargement. Changes are similar to previous study.  Original Report Authenticated By: Marlon Pel, M.D.   Results for orders placed during the hospital encounter of 09/26/11  CBC      Component Value Range   WBC 12.5 (*) 4.0 - 10.5 (K/uL)   RBC 4.04 (*) 4.22 - 5.81 (MIL/uL)   Hemoglobin 12.5 (*) 13.0 - 17.0 (g/dL)   HCT 57.8 (*) 46.9 - 52.0 (%)   MCV 94.3  78.0 - 100.0 (fL)   MCH 30.9   26.0 - 34.0 (pg)   MCHC 32.8  30.0 - 36.0 (g/dL)   RDW 62.9  52.8 - 41.3 (%)   Platelets 340  150 - 400 (K/uL)  DIFFERENTIAL      Component Value Range   Neutrophils Relative 65  43 - 77 (%)   Neutro Abs 8.2 (*) 1.7 - 7.7 (K/uL)   Lymphocytes Relative 25  12 - 46 (%)   Lymphs Abs 3.2  0.7 - 4.0 (K/uL)   Monocytes Relative 7  3 - 12 (%)   Monocytes Absolute 0.9  0.1 - 1.0 (K/uL)   Eosinophils Relative 1  0 - 5 (%)   Eosinophils Absolute 0.2  0.0 - 0.7 (K/uL)   Basophils Relative 1  0 - 1 (%)   Basophils Absolute 0.1  0.0 - 0.1 (K/uL)  COMPREHENSIVE METABOLIC PANEL      Component Value Range   Sodium 135  135 - 145 (mEq/L)   Potassium 4.2  3.5 - 5.1 (mEq/L)   Chloride 95 (*) 96 - 112 (mEq/L)   CO2 26  19 - 32 (mEq/L)   Glucose, Bld 129 (*) 70 - 99 (mg/dL)   BUN 31 (*) 6 - 23 (mg/dL)   Creatinine, Ser 2.44 (*) 0.50 - 1.35 (mg/dL)   Calcium 9.5  8.4 - 01.0 (mg/dL)   Total Protein 7.9  6.0 - 8.3 (g/dL)   Albumin 3.8  3.5 - 5.2 (g/dL)   AST 28  0 - 37 (U/L)   ALT 34  0 - 53 (U/L)   Alkaline Phosphatase 83  39 - 117 (U/L)   Total Bilirubin 0.7  0.3 - 1.2 (mg/dL)   GFR calc non Af Amer 37 (*) >90 (mL/min)   GFR calc Af Amer 43 (*) >90 (mL/min)  PRO B NATRIURETIC PEPTIDE      Component Value Range   Pro B Natriuretic peptide (BNP) 17216.0 (*) 0 - 125 (pg/mL)  TROPONIN I      Component Value Range   Troponin I <0.30  <0.30 (  ng/mL)  BLOOD GAS, ARTERIAL      Component Value Range   O2 Content 3.0     Delivery systems NASAL CANNULA     pH, Arterial 7.439  7.350 - 7.450    pCO2 arterial 36.1  35.0 - 45.0 (mmHg)   pO2, Arterial 65.2 (*) 80.0 - 100.0 (mmHg)   Bicarbonate 24.1 (*) 20.0 - 24.0 (mEq/L)   TCO2 21.4  0 - 100 (mmol/L)   Acid-Base Excess 0.4  0.0 - 2.0 (mmol/L)   O2 Saturation 92.2     Patient temperature 37.0     Collection site LEFT RADIAL     Drawn by [id number]     Sample type ARTERIAL     Allens test (pass/fail) PASS  PASS     Date: 09/27/2011  Rate: 104  Rhythm:  sinus tachycardia  QRS Axis: normal  Intervals: normal  ST/T Wave abnormalities: nonspecific T wave changes  Conduction Disutrbances:none  Narrative Interpretation:   Old EKG Reviewed: unchanged The dosing changes in EKG compared to 09/11/2011   1. COPD (chronic obstructive pulmonary disease)       MDM  Patient with recent hospitalization for pneumonia and also had a heart attack while in the hospital being followed by cardiology also patient recently started on albuterol inhaler at home suggestive of COPD patient does have a history of smoking until just recently. Patient presented with increased shortness of breath patient on his normal 2 L of oxygen was since had saturations below 90% patient given albuterol Atrovent nebulizers x2 in the emergency department with much improvement in his shortness of breath even know we didn't hear any wheezing her blood gas shows no significant elevation in PCO2 and the PO2 was in the 60s. On 2 L of oxygen in the emergency department after the treatments patient's saturations were 89 292% on 3 L saturations were 95-98%. We'll send patient home continuing to use albuterol inhaler 2 puffs every 6 hours also given prednisone in the emergency partner 60 mg and will continue that at 40 mg the next 5 days.  Chest x-ray shows no change in the pneumonia your blood gas shows no respiratory decompensation. Patient will require close followup a new process could be developing. The tachycardia persists however some of this may be due to the albuterol Atrovent although he also had a heart rate of 103 on March 18 and a heart rate of 104 today so this may be somewhat similar to his baseline.also patient's troponin was negative for significant since he started feeling bad around noontime that was approximately 12 hours ago. Based on the EKG and a troponin today's presentation not consistent with acute cardiac event.  Cardiology is planning cardiac catheterization once the  pneumonia is improved because of the recent heart attack obviously the patient hasn't had stents in the past.      I personally performed the services described in this documentation, which was scribed in my presence. The recorded information has been reviewed and considered.        Shelda Jakes, MD 09/27/11 1914  Shelda Jakes, MD 09/27/11 [phone]

## 2011-09-26 NOTE — ED Notes (Signed)
MD at bedside. Into room to attempt iv access and draw blood.

## 2011-09-26 NOTE — ED Notes (Signed)
Resting sitting up in bed. Clear lung sounds in all fields. Breathing 28x\minute. States he is feeling a lot better. Denies pain. Denies needs. Cup of water per request. Will continue to monitor.

## 2011-09-27 LAB — TROPONIN I: Troponin I: <0.3 ng/mL (ref <0.30)

## 2011-09-27 LAB — COMPREHENSIVE METABOLIC PANEL
ALT: 34 U/L (ref 0–53)
AST: 28 U/L (ref 0–37)
Albumin: 3.8 g/dL (ref 3.5–5.2)
Calcium: 9.5 mg/dL (ref 8.4–10.5)
Chloride: 95 mEq/L — ABNORMAL LOW (ref 96–112)
Creatinine, Ser: 1.95 mg/dL — ABNORMAL HIGH (ref 0.50–1.35)
Sodium: 135 mEq/L (ref 135–145)

## 2011-09-27 LAB — BLOOD GAS, ARTERIAL
Drawn by: 28701
O2 Content: 3 L/min
pCO2 arterial: 36.1 mmHg (ref 35.0–45.0)
pH, Arterial: 7.439 (ref 7.350–7.450)
pO2, Arterial: 65.2 mmHg — ABNORMAL LOW (ref 80.0–100.0)

## 2011-09-27 MED ORDER — ALBUTEROL SULFATE (5 MG/ML) 0.5% IN NEBU
2.5000 mg | INHALATION_SOLUTION | RESPIRATORY_TRACT | Status: AC
  Administered 2011-09-27: 2.5 mg via RESPIRATORY_TRACT
  Filled 2011-09-27: qty 0.5

## 2011-09-27 MED ORDER — ALBUTEROL SULFATE HFA 108 (90 BASE) MCG/ACT IN AERS: 1.0000 | INHALATION_SPRAY | Freq: Four times a day (QID) | RESPIRATORY_TRACT | Status: DC | PRN

## 2011-09-27 MED ORDER — PREDNISONE 10 MG PO TABS: 40.0000 mg | ORAL_TABLET | Freq: Every day | ORAL | Status: DC

## 2011-09-27 MED ORDER — IPRATROPIUM BROMIDE 0.02 % IN SOLN
0.5000 mg | Freq: Once | RESPIRATORY_TRACT | Status: AC
  Administered 2011-09-27: 0.5 mg via RESPIRATORY_TRACT
  Filled 2011-09-27: qty 2.5

## 2011-09-27 MED ORDER — PREDNISONE 20 MG PO TABS
60.0000 mg | ORAL_TABLET | Freq: Once | ORAL | Status: AC
  Administered 2011-09-27: 60 mg via ORAL
  Filled 2011-09-27: qty 3

## 2011-09-27 NOTE — Discharge Instructions (Signed)
Return for new worse breathing problems. Chest x-ray tonight shows a pneumonia to be stable. Arterial blood gas here this evening looks good. Continue usual oxygen at 2 or 3 L. Use albuterol inhaler 2 puffs every 6 hours whether needed or not. Take prednisone as directed. Followup with your regular doctor in the next 2 days as scheduled.

## 2011-09-27 NOTE — ED Notes (Signed)
MD at bedside. 

## 2011-09-27 NOTE — ED Notes (Signed)
RT at bedside for breathing treatment.  

## 2011-09-27 NOTE — ED Notes (Addendum)
Patient is resting comfortably sitting up. Breathing 24x\minute, regular, equal. 93% O2 3L [place]. Denies any needs. Denies pain. Call bell within reach. Skin pink, dry, warm. Will continue to monitor.

## 2011-09-27 NOTE — ED Notes (Signed)
Clear lung sounds in all fields. 95% on 3L O2 [place]. Equal chest rise and fall at 24x\minute, regular, unlabored. Skin pink, dry, warm. No distress. IV fluids stopped. Patient allowed to get dressed. Denies needs. Denies pain.

## 2011-09-27 NOTE — ED Notes (Addendum)
RT at bedside for ABG. Patient resting sitting up in bed. No distress. Equal chest rise and fall, regular, unlabored. Call bell within reach. Denies needs. Breathing 26x\minute, regular, shallow. States he is feeling a lot better. Wife and call bell at bedside.

## 2011-09-28 ENCOUNTER — Encounter (HOSPITAL_COMMUNITY): Payer: Self-pay | Admitting: Emergency Medicine

## 2011-09-28 ENCOUNTER — Inpatient Hospital Stay (HOSPITAL_COMMUNITY)
Admission: EM | Admit: 2011-09-28 | Discharge: 2011-10-02 | DRG: 193 | Disposition: A | Discharge status: Home / Self Care | Payer: Medicaid Other | Attending: Internal Medicine | Admitting: Internal Medicine

## 2011-09-28 DIAGNOSIS — J189 Pneumonia, unspecified organism: Principal | ICD-10-CM | POA: Diagnosis present

## 2011-09-28 DIAGNOSIS — I2589 Other forms of chronic ischemic heart disease: Secondary | ICD-10-CM | POA: Diagnosis present

## 2011-09-28 DIAGNOSIS — R0600 Dyspnea, unspecified: Secondary | ICD-10-CM

## 2011-09-28 DIAGNOSIS — J9610 Chronic respiratory failure, unspecified whether with hypoxia or hypercapnia: Secondary | ICD-10-CM | POA: Diagnosis present

## 2011-09-28 DIAGNOSIS — I340 Nonrheumatic mitral (valve) insufficiency: Secondary | ICD-10-CM | POA: Diagnosis present

## 2011-09-28 DIAGNOSIS — I059 Rheumatic mitral valve disease, unspecified: Secondary | ICD-10-CM | POA: Diagnosis present

## 2011-09-28 DIAGNOSIS — N179 Acute kidney failure, unspecified: Secondary | ICD-10-CM | POA: Diagnosis present

## 2011-09-28 DIAGNOSIS — I2510 Atherosclerotic heart disease of native coronary artery without angina pectoris: Secondary | ICD-10-CM | POA: Diagnosis present

## 2011-09-28 DIAGNOSIS — J4489 Other specified chronic obstructive pulmonary disease: Secondary | ICD-10-CM | POA: Diagnosis present

## 2011-09-28 DIAGNOSIS — I509 Heart failure, unspecified: Secondary | ICD-10-CM | POA: Diagnosis present

## 2011-09-28 DIAGNOSIS — J8410 Pulmonary fibrosis, unspecified: Secondary | ICD-10-CM | POA: Diagnosis present

## 2011-09-28 DIAGNOSIS — J449 Chronic obstructive pulmonary disease, unspecified: Secondary | ICD-10-CM | POA: Diagnosis present

## 2011-09-28 DIAGNOSIS — Z9981 Dependence on supplemental oxygen: Secondary | ICD-10-CM

## 2011-09-28 DIAGNOSIS — Z87891 Personal history of nicotine dependence: Secondary | ICD-10-CM

## 2011-09-28 DIAGNOSIS — N170 Acute kidney failure with tubular necrosis: Secondary | ICD-10-CM | POA: Diagnosis present

## 2011-09-28 DIAGNOSIS — R748 Abnormal levels of other serum enzymes: Secondary | ICD-10-CM | POA: Diagnosis present

## 2011-09-28 DIAGNOSIS — I214 Non-ST elevation (NSTEMI) myocardial infarction: Secondary | ICD-10-CM | POA: Diagnosis present

## 2011-09-28 DIAGNOSIS — Z77090 Contact with and (suspected) exposure to asbestos: Secondary | ICD-10-CM

## 2011-09-28 MED ORDER — ALBUTEROL SULFATE (5 MG/ML) 0.5% IN NEBU
2.5000 mg | INHALATION_SOLUTION | Freq: Once | RESPIRATORY_TRACT | Status: AC
  Administered 2011-09-29: 2.5 mg via RESPIRATORY_TRACT
  Filled 2011-09-28: qty 0.5

## 2011-09-28 MED ORDER — IPRATROPIUM BROMIDE 0.02 % IN SOLN
0.5000 mg | Freq: Once | RESPIRATORY_TRACT | Status: AC
  Administered 2011-09-29: 0.5 mg via RESPIRATORY_TRACT
  Filled 2011-09-28: qty 2.5

## 2011-09-28 NOTE — ED Notes (Signed)
Patient c/o shortness of breath x 30 minutes.

## 2011-09-29 ENCOUNTER — Other Ambulatory Visit: Payer: Self-pay

## 2011-09-29 ENCOUNTER — Encounter (HOSPITAL_COMMUNITY): Payer: Self-pay | Admitting: Internal Medicine

## 2011-09-29 ENCOUNTER — Emergency Department (HOSPITAL_COMMUNITY): Payer: Medicaid Other

## 2011-09-29 DIAGNOSIS — J189 Pneumonia, unspecified organism: Secondary | ICD-10-CM | POA: Diagnosis present

## 2011-09-29 DIAGNOSIS — J449 Chronic obstructive pulmonary disease, unspecified: Secondary | ICD-10-CM | POA: Diagnosis present

## 2011-09-29 DIAGNOSIS — J9610 Chronic respiratory failure, unspecified whether with hypoxia or hypercapnia: Secondary | ICD-10-CM | POA: Diagnosis present

## 2011-09-29 LAB — CARDIAC PANEL(CRET KIN+CKTOT+MB+TROPI)
CK, MB: 4.6 ng/mL — ABNORMAL HIGH (ref 0.3–4.0)
Relative Index: INVALID (ref 0.0–2.5)
Relative Index: INVALID (ref 0.0–2.5)
Relative Index: INVALID (ref 0.0–2.5)
Total CK: 65 U/L (ref 7–232)
Troponin I: <0.3 ng/mL (ref <0.30)

## 2011-09-29 LAB — COMPREHENSIVE METABOLIC PANEL
Albumin: 3.8 g/dL (ref 3.5–5.2)
BUN: 46 mg/dL — ABNORMAL HIGH (ref 6–23)
Creatinine, Ser: 2.23 mg/dL — ABNORMAL HIGH (ref 0.50–1.35)
GFR calc Af Amer: 36 mL/min — ABNORMAL LOW (ref >90)
Glucose, Bld: 164 mg/dL — ABNORMAL HIGH (ref 70–99)
Total Bilirubin: 0.7 mg/dL (ref 0.3–1.2)
Total Protein: 7.9 g/dL (ref 6.0–8.3)

## 2011-09-29 LAB — CREATININE, SERUM
GFR calc Af Amer: 37 mL/min — ABNORMAL LOW (ref >90)
GFR calc non Af Amer: 32 mL/min — ABNORMAL LOW (ref >90)

## 2011-09-29 LAB — CBC
HCT: 41 % (ref 39.0–52.0)
Hemoglobin: 13.3 g/dL (ref 13.0–17.0)
MCH: 31.1 pg (ref 26.0–34.0)
MCHC: 32.4 g/dL (ref 30.0–36.0)
MCHC: 33 g/dL (ref 30.0–36.0)
MCV: 95.8 fL (ref 78.0–100.0)
Platelets: 308 10*3/uL (ref 150–400)
RDW: 15.1 % (ref 11.5–15.5)
WBC: 16.6 10*3/uL — ABNORMAL HIGH (ref 4.0–10.5)

## 2011-09-29 LAB — DIFFERENTIAL
Basophils Relative: 0 % (ref 0–1)
Eosinophils Absolute: 0 10*3/uL (ref 0.0–0.7)
Lymphs Abs: 3.1 10*3/uL (ref 0.7–4.0)
Monocytes Absolute: 1.3 10*3/uL — ABNORMAL HIGH (ref 0.1–1.0)
Monocytes Relative: 8 % (ref 3–12)

## 2011-09-29 LAB — TROPONIN I: Troponin I: <0.3 ng/mL (ref <0.30)

## 2011-09-29 LAB — POCT I-STAT TROPONIN I: Troponin i, poc: 0.04 ng/mL (ref 0.00–0.08)

## 2011-09-29 LAB — TSH: TSH: 1.859 u[IU]/mL (ref 0.350–4.500)

## 2011-09-29 MED ORDER — ONDANSETRON HCL 4 MG/2ML IJ SOLN: 4.0000 mg | Freq: Four times a day (QID) | INTRAMUSCULAR | Status: DC | PRN

## 2011-09-29 MED ORDER — DEXTROSE 5 % IV SOLN
1.0000 g | Freq: Once | INTRAVENOUS | Status: AC
  Administered 2011-09-29: 1 g via INTRAVENOUS
  Filled 2011-09-29: qty 1

## 2011-09-29 MED ORDER — ENOXAPARIN SODIUM 40 MG/0.4ML ~~LOC~~ SOLN
40.0000 mg | SUBCUTANEOUS | Status: DC
  Administered 2011-09-29 – 2011-10-02 (×4): 40 mg via SUBCUTANEOUS
  Filled 2011-09-29 (×4): qty 0.4

## 2011-09-29 MED ORDER — PIPERACILLIN-TAZOBACTAM 3.375 G IVPB
3.3750 g | Freq: Once | INTRAVENOUS | Status: AC
  Administered 2011-09-29: 3.375 g via INTRAVENOUS
  Filled 2011-09-29: qty 50

## 2011-09-29 MED ORDER — ALBUTEROL SULFATE (5 MG/ML) 0.5% IN NEBU
2.5000 mg | INHALATION_SOLUTION | RESPIRATORY_TRACT | Status: DC | PRN
  Administered 2011-09-29: 2.5 mg via RESPIRATORY_TRACT
  Filled 2011-09-29: qty 0.5

## 2011-09-29 MED ORDER — ASPIRIN EC 81 MG PO TBEC
81.0000 mg | DELAYED_RELEASE_TABLET | Freq: Every day | ORAL | Status: DC
  Administered 2011-09-29 – 2011-10-02 (×4): 81 mg via ORAL
  Filled 2011-09-29 (×4): qty 1

## 2011-09-29 MED ORDER — IPRATROPIUM BROMIDE 0.02 % IN SOLN
0.5000 mg | Freq: Four times a day (QID) | RESPIRATORY_TRACT | Status: DC | PRN
  Administered 2011-09-29: 0.5 mg via RESPIRATORY_TRACT
  Filled 2011-09-29: qty 2.5

## 2011-09-29 MED ORDER — LEVOFLOXACIN IN D5W 500 MG/100ML IV SOLN
INTRAVENOUS | Status: AC
  Filled 2011-09-29: qty 100

## 2011-09-29 MED ORDER — ACETAMINOPHEN 650 MG RE SUPP: 650.0000 mg | Freq: Four times a day (QID) | RECTAL | Status: DC | PRN

## 2011-09-29 MED ORDER — OXYCODONE HCL 5 MG PO TABS
5.0000 mg | ORAL_TABLET | ORAL | Status: DC | PRN
  Administered 2011-09-29 – 2011-10-01 (×3): 5 mg via ORAL
  Filled 2011-09-29 (×3): qty 1

## 2011-09-29 MED ORDER — ALBUTEROL SULFATE (5 MG/ML) 0.5% IN NEBU
2.5000 mg | INHALATION_SOLUTION | Freq: Four times a day (QID) | RESPIRATORY_TRACT | Status: DC | PRN
  Administered 2011-09-29: 2.5 mg via RESPIRATORY_TRACT
  Filled 2011-09-29: qty 0.5

## 2011-09-29 MED ORDER — DEXTROSE 5 % IV SOLN
INTRAVENOUS | Status: AC
  Filled 2011-09-29: qty 1

## 2011-09-29 MED ORDER — ALPRAZOLAM 0.25 MG PO TABS
0.2500 mg | ORAL_TABLET | Freq: Three times a day (TID) | ORAL | Status: DC | PRN
  Administered 2011-09-29: 0.25 mg via ORAL
  Filled 2011-09-29: qty 1

## 2011-09-29 MED ORDER — METOPROLOL TARTRATE 50 MG PO TABS
50.0000 mg | ORAL_TABLET | Freq: Two times a day (BID) | ORAL | Status: DC
  Administered 2011-09-29: 50 mg via ORAL
  Filled 2011-09-29: qty 1

## 2011-09-29 MED ORDER — TRAZODONE HCL 50 MG PO TABS
25.0000 mg | ORAL_TABLET | Freq: Every evening | ORAL | Status: DC | PRN
  Filled 2011-09-29: qty 1

## 2011-09-29 MED ORDER — DEXTROSE 5 % IV SOLN
2.0000 g | INTRAVENOUS | Status: DC
  Administered 2011-09-30 – 2011-10-01 (×2): 2 g via INTRAVENOUS
  Filled 2011-09-29 (×3): qty 2

## 2011-09-29 MED ORDER — SODIUM CHLORIDE 0.9 % IJ SOLN
3.0000 mL | Freq: Two times a day (BID) | INTRAMUSCULAR | Status: DC
  Administered 2011-09-29 – 2011-10-02 (×8): 3 mL via INTRAVENOUS
  Filled 2011-09-29 (×7): qty 3

## 2011-09-29 MED ORDER — VANCOMYCIN HCL 1000 MG IV SOLR
1250.0000 mg | INTRAVENOUS | Status: DC
  Administered 2011-09-30 – 2011-10-01 (×2): 1250 mg via INTRAVENOUS
  Filled 2011-09-29 (×3): qty 1250

## 2011-09-29 MED ORDER — FLEET ENEMA 7-19 GM/118ML RE ENEM: 1.0000 | ENEMA | Freq: Once | RECTAL | Status: AC | PRN

## 2011-09-29 MED ORDER — ONDANSETRON HCL 4 MG PO TABS
4.0000 mg | ORAL_TABLET | Freq: Four times a day (QID) | ORAL | Status: DC | PRN
  Administered 2011-10-01: 4 mg via ORAL
  Filled 2011-09-29: qty 1

## 2011-09-29 MED ORDER — LEVOFLOXACIN IN D5W 500 MG/100ML IV SOLN
500.0000 mg | Freq: Once | INTRAVENOUS | Status: AC
  Administered 2011-09-29: 500 mg via INTRAVENOUS
  Filled 2011-09-29: qty 100

## 2011-09-29 MED ORDER — LEVOFLOXACIN IN D5W 750 MG/150ML IV SOLN
750.0000 mg | INTRAVENOUS | Status: DC
  Administered 2011-10-01: 750 mg via INTRAVENOUS
  Filled 2011-09-29: qty 150

## 2011-09-29 MED ORDER — NICOTINE 21 MG/24HR TD PT24
21.0000 mg | MEDICATED_PATCH | Freq: Every day | TRANSDERMAL | Status: DC
  Administered 2011-09-29 – 2011-10-02 (×4): 21 mg via TRANSDERMAL
  Filled 2011-09-29 (×4): qty 1

## 2011-09-29 MED ORDER — VANCOMYCIN HCL IN DEXTROSE 1-5 GM/200ML-% IV SOLN
1000.0000 mg | Freq: Once | INTRAVENOUS | Status: AC
  Administered 2011-09-29: 1000 mg via INTRAVENOUS
  Filled 2011-09-29: qty 200

## 2011-09-29 MED ORDER — CLOPIDOGREL BISULFATE 75 MG PO TABS
75.0000 mg | ORAL_TABLET | Freq: Every day | ORAL | Status: DC
  Administered 2011-09-29 – 2011-10-02 (×4): 75 mg via ORAL
  Filled 2011-09-29 (×4): qty 1

## 2011-09-29 MED ORDER — BISACODYL 10 MG RE SUPP: 10.0000 mg | Freq: Every day | RECTAL | Status: DC | PRN

## 2011-09-29 MED ORDER — CYCLOBENZAPRINE HCL 10 MG PO TABS
10.0000 mg | ORAL_TABLET | Freq: Three times a day (TID) | ORAL | Status: DC | PRN
  Administered 2011-09-29 – 2011-10-01 (×3): 10 mg via ORAL
  Filled 2011-09-29 (×3): qty 1

## 2011-09-29 MED ORDER — LORAZEPAM 2 MG/ML IJ SOLN
0.5000 mg | Freq: Once | INTRAMUSCULAR | Status: AC
Start: 2011-09-29 — End: 2011-09-29
  Administered 2011-09-29: 0.5 mg via INTRAVENOUS
  Filled 2011-09-29: qty 1

## 2011-09-29 MED ORDER — ACETAMINOPHEN 325 MG PO TABS: 650.0000 mg | ORAL_TABLET | Freq: Four times a day (QID) | ORAL | Status: DC | PRN

## 2011-09-29 NOTE — Progress Notes (Signed)
Attempted to call Dr. Juanetta Gosling office. Could not leave message, placed on treatment team, and will notify next shift. Sheryn Bison

## 2011-09-29 NOTE — Progress Notes (Signed)
CARE MANAGEMENT NOTE 09/29/2011  Patient:  Charles Savage, Charles Savage   Account Number:  [id number]  Date Initiated:  09/29/2011  Documentation initiated by:  Rosemary Holms  Subjective/Objective Assessment:   pt admitted from home with spouse. Admitted with PNA. No previous HH needed.     Action/Plan:   Spoke with pt at bedside. If HH/DME is ordered, pt chose [facility]   Anticipated DC Date:  10/02/2011   Anticipated DC Plan:  HOME/SELF CARE      DC Planning Services  CM consult      Choice offered to / List presented to:             Status of service:  In process, will continue to follow Medicare Important Message given?   (If response is "NO", the following Medicare IM given date fields will be blank) Date Medicare IM given:   Date Additional Medicare IM given:    Discharge Disposition:    Per UR Regulation:    If discussed at Long Length of Stay Meetings, dates discussed:    Comments:  09/29/11 1100 Emilio Baylock RN BSN CM If HH needed, please call orders to [facility] @ [phone]

## 2011-09-29 NOTE — Progress Notes (Signed)
Patient still continues to be short of breath. Wife in room reported that patient has taken 50mg  of Lopressor at home and has had shortness of breath and vomiting after taking it. Wife stated they started cutting the pill in half to help prevent this. Patient had his scheduled Lopressor 50mg  this morning and did experience shortness of breath not long after taking Lopressor. Notified Dr. Kerry Hough who ordered another breathing treatment and discontinued the Lopressor 50mg .

## 2011-09-29 NOTE — Progress Notes (Signed)
Called into room because patient stated he couldn't breath. Patient sitting in bed and breath hard. Vital signs: BP116/83, P92, R24, O295% on 2L [place]. Called Dr. Kerry Hough who reviewed patient's file and will order xanax. Will continue to monitor.

## 2011-09-29 NOTE — H&P (Signed)
PCP:   Colette Ribas, MD, MD   Chief Complaint:  Progressive shortness of breath x1 week  HPI: Charles Savage is an 56 y.o. male.  Feels that he was quite well until he had a stroke in February 2013, treated by stent at [facility]. In fact he has a heavy history of alcohol abuse discontinuing 2 years ago, and tobacco abuse discontinuing after his stroke. He was discharged from [facility] about 8 days ago after treatment for respiratory failure associated with COPD and healthcare associated pneumonia, as well as non-ST EMI, and acute renal failure with ATN.  Patient felt well at discharge but since being home he is been progressive decline getting progressively weaker and getting more short of breath. He was seen in the emergency room 3 days ago and felt to be having a COPD and given steroids and sent home, and he reports no improvement and came back to the emergency room tonight where he his a chest x-ray was again repeat and it shows infiltrates persist and he is now hypoxic on his baseline 2 L of oxygen.Marland Kitchen Hospitalist service was called to assist.  He has been on no antibiotics since hospital hospital discharge; he is been having increasing cough productive of brown sputum; no fever; no PND; no leg swelling or weight gain; his wife reports marked anorexia at the sight of food. He denies any known history of liver disease.  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  Rewiew of Systems:  The patient denies, fever, weight loss,, vision loss, decreased hearing, hoarseness, chest pain, syncope,  peripheral edema, balance deficits, hemoptysis, abdominal pain, melena, hematochezia, severe indigestion/heartburn, hematuria, incontinence, genital sores, muscle weakness, suspicious skin lesions, transient blindness, difficulty walking, depression, unusual weight change, abnormal bleeding, enlarged lymph nodes, angioedema, and breast masses.    Past  Medical History  Diagnosis Date  . Chronic pain     back pain  . Stroke   . Hyperlipemia   . Lumbar disc disease   . Carotid artery disease     Past Surgical History  Procedure Date  . Back surgery 1324,4010  . Knee arthroscopy 2002    rt  . Foreign body removal 06/05/2011    Procedure: FOREIGN BODY REMOVAL ADULT;  Surgeon: Rosalio Macadamia;  Location: [facility];  Service: Plastics;  Laterality: Right;  removal foreign body from right side face   . Carotid endarterectomy     Medications:  HOME MEDS: Prior to Admission medications   Medication Sig Start Date End Date Taking? Authorizing Provider  albuterol (PROVENTIL HFA;VENTOLIN HFA) 108 (90 BASE) MCG/ACT inhaler Inhale 2 puffs into the lungs every 6 (six) hours as needed for wheezing. 09/20/11 09/19/12 Yes Simbiso Ranga, MD  albuterol (PROVENTIL HFA;VENTOLIN HFA) 108 (90 BASE) MCG/ACT inhaler Inhale 1-2 puffs into the lungs every 6 (six) hours as needed for wheezing. 09/27/11 09/26/12 Yes Shelda Jakes, MD  aspirin EC 81 MG EC tablet Take 1 tablet (81 mg total) by mouth daily. 09/20/11 09/19/12 Yes Simbiso Ranga, MD  clopidogrel (PLAVIX) 75 MG tablet Take 1 tablet (75 mg total) by mouth daily with breakfast. 09/20/11 09/19/12 Yes Simbiso Ranga, MD  cyclobenzaprine (FLEXERIL) 10 MG tablet Take 10 mg by mouth 3 (three) times daily as needed.     Yes Historical Provider, MD  HYDROcodone-acetaminophen (NORCO) 10-325 MG per tablet Take 1 tablet by mouth every 8 (eight) hours as needed. Pain   Yes Historical Provider,  MD  metoprolol (LOPRESSOR) 50 MG tablet Take 1 tablet (50 mg total) by mouth 2 (two) times daily. 09/20/11 09/19/12 Yes Simbiso Ranga, MD  predniSONE (DELTASONE) 10 MG tablet Take 4 tablets (40 mg total) by mouth daily. 09/27/11  Yes Shelda Jakes, MD     Allergies:  Allergies  Allergen Reactions  . Morphine And Related Hives and Itching  . Other     Peaches cause hives and itching     Social  History:   reports that he has quit smoking. His smoking use included Cigarettes. He has a 60 pack-year smoking history. He does not have any smokeless tobacco history on file. He reports that he does not drink alcohol or use illicit drugs.  Family History: Family History  Problem Relation Age of Onset  . Coronary artery disease Mother   . Coronary artery disease Father      Physical Exam: Filed Vitals:   09/29/11 0031 09/29/11 0200 09/29/11 0300 09/29/11 0400  BP:  109/68 114/74 122/81  Pulse:  25 96 96  Resp:  20 22 23   Height:      Weight:      SpO2: 97% 77% 93% 94%   Blood pressure 122/81, pulse 96, resp. rate 23, height 5\' 8"  (1.727 m), weight 86.183 kg (190 lb), SpO2 94.00%.  GEN:  Ill-looking middle-aged Caucasian gentleman lying in the stretcher; ashen skin; not very cooperative with exam due to sedation in the ER. PSYCH:  Not completely alert andabout possibly due to Ativan; depressed affect. HEENT: Mucous membranes pink dry and anicteric;; no cervical lymphadenopathy nor thyromegaly,; no JVD; Breasts:: Not examined CHEST WALL: No tenderness CHEST: Coarse bibasilar crackles in the mid zones bilaterally HEART: Regular rate and rhythm; 2/6 systolic murmur BACK:  Kyphosis; no CVA tenderness ABDOMEN: soft non-tender; does have flank dullness; no masses, no organomegaly, normal abdominal bowel sounds; no intertriginous candida. Rectal Exam: Not done EXTREMITIES:  no edema; no ulcerations. Genitalia: not examined PULSES: 2+ and symmetric SKIN: Normal hydration no rash or ulceration CNS: Cranial nerves 2-12 grossly intact no focal lateralizing neurologic deficit; unable to do full evaluation because of weakness   Labs & Imaging Results for orders placed during the hospital encounter of 09/28/11 (from the past 48 hour(s))  CBC     Status: Abnormal   Collection Time   09/29/11 12:12 AM      Component Value Range Comment   WBC 16.5 (*) 4.0 - 10.5 (K/uL)    RBC 4.28  4.22 -  5.81 (MIL/uL)    Hemoglobin 13.3  13.0 - 17.0 (g/dL)    HCT 16.1  09.6 - 04.5 (%)    MCV 95.8  78.0 - 100.0 (fL)    MCH 31.1  26.0 - 34.0 (pg)    MCHC 32.4  30.0 - 36.0 (g/dL)    RDW 40.9  81.1 - 91.4 (%)    Platelets 325  150 - 400 (K/uL)   DIFFERENTIAL     Status: Abnormal   Collection Time   09/29/11 12:12 AM      Component Value Range Comment   Neutrophils Relative 73  43 - 77 (%)    Neutro Abs 12.1 (*) 1.7 - 7.7 (K/uL)    Lymphocytes Relative 19  12 - 46 (%)    Lymphs Abs 3.1  0.7 - 4.0 (K/uL)    Monocytes Relative 8  3 - 12 (%)    Monocytes Absolute 1.3 (*) 0.1 - 1.0 (K/uL)    Eosinophils  Relative 0  0 - 5 (%)    Eosinophils Absolute 0.0  0.0 - 0.7 (K/uL)    Basophils Relative 0  0 - 1 (%)    Basophils Absolute 0.0  0.0 - 0.1 (K/uL)   COMPREHENSIVE METABOLIC PANEL     Status: Abnormal   Collection Time   09/29/11 12:12 AM      Component Value Range Comment   Sodium 135  135 - 145 (mEq/L)    Potassium 4.9  3.5 - 5.1 (mEq/L)    Chloride 95 (*) 96 - 112 (mEq/L)    CO2 23  19 - 32 (mEq/L)    Glucose, Bld 164 (*) 70 - 99 (mg/dL)    BUN 46 (*) 6 - 23 (mg/dL)    Creatinine, Ser 2.13 (*) 0.50 - 1.35 (mg/dL)    Calcium 9.5  8.4 - 10.5 (mg/dL)    Total Protein 7.9  6.0 - 8.3 (g/dL)    Albumin 3.8  3.5 - 5.2 (g/dL)    AST 50 (*) 0 - 37 (U/L)    ALT 55 (*) 0 - 53 (U/L)    Alkaline Phosphatase 82  39 - 117 (U/L)    Total Bilirubin 0.7  0.3 - 1.2 (mg/dL)    GFR calc non Af Amer 31 (*) >90 (mL/min)    GFR calc Af Amer 36 (*) >90 (mL/min)   TROPONIN I     Status: Normal   Collection Time   09/29/11 12:12 AM      Component Value Range Comment   Troponin I <0.30  <0.30 (ng/mL)   POCT I-STAT TROPONIN I     Status: Normal   Collection Time   09/29/11 12:12 AM      Component Value Range Comment   Troponin i, poc 0.04  0.00 - 0.08 (ng/mL)    Comment 3             Dg Chest Portable 1 View  09/29/2011  *RADIOLOGY REPORT*  Clinical Data: Shortness of breath.  PORTABLE CHEST - 1 VIEW   Comparison: 09/26/2011.  Findings: The heart is mildly enlarged but stable.  There is persistent peribronchial thickening, increased interstitial markings and patchy right upper lobe infiltrate.  Both lung bases are slightly better aerated with resolving effusions and atelectasis.  IMPRESSION: 1.  Improved basilar aeration. 2.  Persistent right upper lobe infiltrate. 3.  Stable bronchitic changes.  Original Report Authenticated By: P. Loralie Champagne, M.D.      Assessment Present on Admission:   respiratory difficulties due to multifactorial etiology including possible resurgence of healthcare associated pneumonia, interstitial lung disease, COPD, heart failure .Chronic respiratory failure .COPD (chronic obstructive pulmonary disease) .Interstitial lung disease .HCAP (healthcare-associated pneumonia) .Severe mitral regurgitation .Acute renal failure secondary to ATN versus acute interstitial nephritis .CAD (coronary artery disease), recent NTEMI  PLAN: Admit this gentleman for respiratory support, will restart broad-spectrum antibiotics while awaiting a pulmonary consult, give nebulizer patient's as needed, and continue the management of his chronic medical problems.  This gentleman is rather complicated and may benefit from a multifactorial approach including a cardiology consult.  May need evaluation for liver disease, liver functions are slightly elevated, and there was a suggestion of hepatic cirrhosis on a recent CT scan of the chest.     Nicolina Hirt 09/29/2011, 5:06 AM

## 2011-09-29 NOTE — ED Notes (Signed)
Patient admitted to room 310.

## 2011-09-29 NOTE — Progress Notes (Signed)
Patient resting in bed. Shortness of breath has resolved. Patient denies any other needs at this time. Will continue to monitor.

## 2011-09-29 NOTE — Progress Notes (Signed)
UR Chart Review Completed  

## 2011-09-29 NOTE — Progress Notes (Signed)
Subjective: Still feels short of breath, having difficulty laying flat, coughing  Objective: Vital signs in last 24 hours: Temp:  [97.6 F (36.4 C)-98.4 F (36.9 C)] 97.6 F (36.4 C) (04/05 1016) Pulse Rate:  [25-99] 97  (04/05 1016) Resp:  [20-24] 24  (04/05 1016) BP: (109-122)/(68-87) 118/84 mmHg (04/05 1016) SpO2:  [77 %-97 %] 93 % (04/05 1016) Weight:  [86.183 kg (190 lb)-88 kg (194 lb 0.1 oz)] 88 kg (194 lb 0.1 oz) (04/05 0616) Weight change:  Last BM Date: 09/28/11  Intake/Output from previous day:   Total I/O In: 333 [P.O.:230; I.V.:3; IV Piggyback:100] Out: -    Physical Exam: General: Alert, awake, oriented x3, in no acute distress. HEENT: No bruits, no goiter. Heart: Regular rate and rhythm, without murmurs, rubs, gallops. Lungs: rhonchi at bases, good air movement b/l. Abdomen: Soft, nontender, nondistended, positive bowel sounds. Extremities: No clubbing cyanosis or edema with positive pedal pulses. Neuro: Grossly intact, nonfocal.    Lab Results: Basic Metabolic Panel:  Basename 09/29/11 0605 09/29/11 0012 09/26/11 2321  NA -- 135 135  K -- 4.9 4.2  CL -- 95* 95*  CO2 -- 23 26  GLUCOSE -- 164* 129*  BUN -- 46* 31*  CREATININE 2.21* 2.23* --  CALCIUM -- 9.5 9.5  MG 2.2 -- --  PHOS -- -- --   Liver Function Tests:  Basename 09/29/11 0012 09/26/11 2321  AST 50* 28  ALT 55* 34  ALKPHOS 82 83  BILITOT 0.7 0.7  PROT 7.9 7.9  ALBUMIN 3.8 3.8   No results found for this basename: LIPASE:2,AMYLASE:2 in the last 72 hours No results found for this basename: AMMONIA:2 in the last 72 hours CBC:  Basename 09/29/11 0605 09/29/11 0012 09/26/11 2321  WBC 16.6* 16.5* --  NEUTROABS -- 12.1* 8.2*  HGB 12.7* 13.3 --  HCT 38.5* 41.0 --  MCV 93.7 95.8 --  PLT 308 325 --   Cardiac Enzymes:  Basename 09/29/11 0605 09/29/11 0012 09/26/11 2321  CKTOTAL 65 -- --  CKMB 4.6* -- --  CKMBINDEX -- -- --  TROPONINI <0.30 <0.30 <0.30   BNP:  Basename  09/26/11 2321  PROBNP 17216.0*   D-Dimer: No results found for this basename: DDIMER:2 in the last 72 hours CBG: No results found for this basename: GLUCAP:6 in the last 72 hours Hemoglobin A1C: No results found for this basename: HGBA1C in the last 72 hours Fasting Lipid Panel: No results found for this basename: CHOL,HDL,LDLCALC,TRIG,CHOLHDL,LDLDIRECT in the last 72 hours Thyroid Function Tests: No results found for this basename: TSH,T4TOTAL,FREET4,T3FREE,THYROIDAB in the last 72 hours Anemia Panel: No results found for this basename: VITAMINB12,FOLATE,FERRITIN,TIBC,IRON,RETICCTPCT in the last 72 hours Coagulation: No results found for this basename: LABPROT:2,INR:2 in the last 72 hours Urine Drug Screen: Drugs of Abuse  No results found for this basename: labopia, cocainscrnur, labbenz, amphetmu, thcu, labbarb    Alcohol Level: No results found for this basename: ETH:2 in the last 72 hours Urinalysis: No results found for this basename: COLORURINE:2,APPERANCEUR:2,LABSPEC:2,PHURINE:2,GLUCOSEU:2,HGBUR:2,BILIRUBINUR:2,KETONESUR:2,PROTEINUR:2,UROBILINOGEN:2,NITRITE:2,LEUKOCYTESUR:2 in the last 72 hours  No results found for this or any previous visit (from the past 240 hour(s)).  Studies/Results: Dg Chest Portable 1 View  09/29/2011  *RADIOLOGY REPORT*  Clinical Data: Shortness of breath.  PORTABLE CHEST - 1 VIEW  Comparison: 09/26/2011.  Findings: The heart is mildly enlarged but stable.  There is persistent peribronchial thickening, increased interstitial markings and patchy right upper lobe infiltrate.  Both lung bases are slightly better aerated with resolving effusions and atelectasis.  IMPRESSION: 1.  Improved basilar aeration. 2.  Persistent right upper lobe infiltrate. 3.  Stable bronchitic changes.  Original Report Authenticated By: P. Loralie Champagne, M.D.    Medications: Scheduled Meds:   . albuterol  2.5 mg Nebulization Once  . aspirin EC  81 mg Oral Daily  . ceFEPime  (MAXIPIME) IV  1 g Intravenous Once  . ceFEPime (MAXIPIME) IV  2 g Intravenous Q24H  . clopidogrel  75 mg Oral Q breakfast  . enoxaparin  40 mg Subcutaneous Q24H  . ipratropium  0.5 mg Nebulization Once  . levofloxacin (LEVAQUIN) IV  500 mg Intravenous Once  . levofloxacin (LEVAQUIN) IV  750 mg Intravenous Q48H  . LORazepam  0.5 mg Intravenous Once  . metoprolol  50 mg Oral BID  . piperacillin-tazobactam (ZOSYN)  IV  3.375 g Intravenous Once  . sodium chloride  3 mL Intravenous Q12H  . vancomycin  1,250 mg Intravenous Q24H  . vancomycin  1,000 mg Intravenous Once   Continuous Infusions:  PRN Meds:.acetaminophen, acetaminophen, albuterol, bisacodyl, cyclobenzaprine, ipratropium, ondansetron (ZOFRAN) IV, ondansetron, oxyCODONE, sodium phosphate, traZODone  Assessment/Plan:  Active Problems:  Severe mitral regurgitation  Acute renal failure secondary to ATN versus acute interstitial nephritis  CAD (coronary artery disease)  Interstitial lung disease  HCAP (healthcare-associated pneumonia)  Chronic respiratory failure  COPD (chronic obstructive pulmonary disease)  1. Respiratory failure, possibly due to recurrence of HCAP, interstitial lung disease, also has history of COPD and CHF Patient is on broad spectrum antibiotics. Pulmonology has seen the patient in consultation and agrees with current antibiotics.  His BNP is elevated, but he also has renal failure and could be elevated due to decreased clearance. He does not have other signs of volume overload.  Will continue to monitor for now.  2. Acute renal failure due to recent ATN.  Creatinine improving from prior admission  3. CAD with recent NSTEMI.  Patient will need further cardiac work up, once renal issues better, currently on medical management.  4. Cardiomyopathy, possibly ischemic.  Continue supportive care  5. Condition guarded   LOS: 1 day   Madia Carvell [facility] Pager: [phone] 09/29/2011, 12:04  PM

## 2011-09-29 NOTE — Progress Notes (Signed)
ANTIBIOTIC CONSULT NOTE  Pharmacy Consult for Cefepime, Vancomycin, Levofloxacin Indication: R/O pneumonia  Allergies  Allergen Reactions  . Morphine And Related Hives and Itching  . Other     Peaches cause hives and itching     Patient Measurements: Height: 5\' 8"  (172.7 cm) Weight: 194 lb 0.1 oz (88 kg) IBW/kg (Calculated) : 68.4  Adjusted Body Weight: 74 kg  Vital Signs: Temp: 98.4 F (36.9 C) (04/05 0616) Temp src: Oral (04/05 0616) BP: 122/87 mmHg (04/05 0616) Pulse Rate: 99  (04/05 0616) Intake/Output from previous day:   Intake/Output from this shift: Total I/O In: 230 [P.O.:230] Out: -   Labs:  Basename 09/29/11 0605 09/29/11 0012 09/26/11 2321  WBC 16.6* 16.5* 12.5*  HGB 12.7* 13.3 12.5*  PLT 308 325 340  LABCREA -- -- --  CREATININE 2.21* 2.23* 1.95*   Estimated Creatinine Clearance: 40.2 ml/min (by C-G formula based on Cr of 2.21).   Microbiology: Recent Results (from the past 720 hour(s))  CULTURE, BLOOD (ROUTINE X 2)     Status: Normal   Collection Time   09/10/11  3:34 PM      Component Value Range Status Comment   Specimen Description BLOOD RIGHT HAND   Final    Special Requests BOTTLES DRAWN AEROBIC AND ANAEROBIC 10CC   Final    Culture  Setup Time [id number]   Final    Culture NO GROWTH 5 DAYS   Final    Report Status 09/16/2011 FINAL   Final   CULTURE, BLOOD (ROUTINE X 2)     Status: Normal   Collection Time   09/10/11  3:35 PM      Component Value Range Status Comment   Specimen Description BLOOD RIGHT ARM   Final    Special Requests BOTTLES DRAWN AEROBIC AND ANAEROBIC 10CC   Final    Culture  Setup Time [id number]   Final    Culture NO GROWTH 5 DAYS   Final    Report Status 09/16/2011 FINAL   Final   CULTURE, SPUTUM-ASSESSMENT     Status: Normal   Collection Time   09/11/11  6:36 PM      Component Value Range Status Comment   Specimen Description SPUTUM   Final    Special Requests NONE   Final    Sputum evaluation     Final    Value: MICROSCOPIC FINDINGS SUGGEST THAT THIS SPECIMEN IS NOT REPRESENTATIVE OF LOWER RESPIRATORY SECRETIONS. PLEASE RECOLLECT.     CALLED TO RN E.SMITH AT 1923 09/11/11 BY L.PITT   Report Status 09/11/2011 FINAL   Final   CULTURE, SPUTUM-ASSESSMENT     Status: Normal   Collection Time   09/14/11  1:38 AM      Component Value Range Status Comment   Specimen Description SPUTUM   Final    Special Requests NONE   Final    Sputum evaluation     Final    Value: MICROSCOPIC FINDINGS SUGGEST THAT THIS SPECIMEN IS NOT REPRESENTATIVE OF LOWER RESPIRATORY SECRETIONS. PLEASE RECOLLECT.     CALLED TO A.BOEHLING,RN 0248 09/14/11 M.CAMPBELL   Report Status 09/14/2011 FINAL   Final   CULTURE, SPUTUM-ASSESSMENT     Status: Normal   Collection Time   09/14/11  9:24 PM      Component Value Range Status Comment   Specimen Description SPUTUM   Final    Special Requests Normal   Final    Sputum evaluation     Final  Value: THIS SPECIMEN IS ACCEPTABLE. RESPIRATORY CULTURE REPORT TO FOLLOW.   Report Status 09/14/2011 FINAL   Final   CULTURE, RESPIRATORY     Status: Normal   Collection Time   09/14/11  9:24 PM      Component Value Range Status Comment   Specimen Description SPUTUM   Final    Special Requests NONE   Final    Gram Stain     Final    Value: RARE WBC PRESENT,BOTH PMN AND MONONUCLEAR     RARE SQUAMOUS EPITHELIAL CELLS PRESENT     NO ORGANISMS SEEN   Culture NORMAL OROPHARYNGEAL FLORA   Final    Report Status 09/17/2011 FINAL   Final     Medical History: Past Medical History  Diagnosis Date  . Chronic pain     back pain  . Stroke   . Hyperlipemia   . Lumbar disc disease   . Carotid artery disease     Medications:     . albuterol  2.5 mg Nebulization Once  . aspirin EC  81 mg Oral Daily  . ceFEPime (MAXIPIME) IV  1 g Intravenous Once  . clopidogrel  75 mg Oral Q breakfast  . enoxaparin  40 mg Subcutaneous Q24H  . ipratropium  0.5 mg Nebulization Once  . levofloxacin  (LEVAQUIN) IV  500 mg Intravenous Once  . LORazepam  0.5 mg Intravenous Once  . metoprolol  50 mg Oral BID  . piperacillin-tazobactam (ZOSYN)  IV  3.375 g Intravenous Once  . sodium chloride  3 mL Intravenous Q12H  . vancomycin  1,000 mg Intravenous Once     Assessment: Okay for Protocol Estimated Creatinine Clearance: 40.2 ml/min (by C-G formula based on Cr of 2.21).  Goal of Therapy:  Vanc trough 15-20. Eradicate infection.  Plan:  Cefepime 2gm IV every 24 hours. Levaquin 750mg  IV every 48 hours. Vancomycin 1250mg  IV every 24 hours. Trough at Steady state. F/U Micro data.  Mady Gemma 09/29/2011,8:33 AM

## 2011-09-29 NOTE — Progress Notes (Signed)
ANTIBIOTIC CONSULT NOTE - INITIAL  Pharmacy Consult for cefipime, vancomycin, levofloxacin Indication: R/O pneumonia  Allergies  Allergen Reactions  . Morphine And Related Hives and Itching  . Other     Peaches cause hives and itching     Patient Measurements: Height: 5\' 8"  (172.7 cm) Weight: 190 lb (86.183 kg) IBW/kg (Calculated) : 68.4  Adjusted Body Weight: 74 kg  Vital Signs: BP: 122/81 mmHg (04/05 0400) Pulse Rate: 96  (04/05 0400) Intake/Output from previous day:   Intake/Output from this shift:    Labs:  Basename 09/29/11 0012 09/26/11 2321  WBC 16.5* 12.5*  HGB 13.3 12.5*  PLT 325 340  LABCREA -- --  CREATININE 2.23* 1.95*   Estimated Creatinine Clearance: 39.5 ml/min (by C-G formula based on Cr of 2.23).   Microbiology: Recent Results (from the past 720 hour(s))  CULTURE, BLOOD (ROUTINE X 2)     Status: Normal   Collection Time   09/10/11  3:34 PM      Component Value Range Status Comment   Specimen Description BLOOD RIGHT HAND   Final    Special Requests BOTTLES DRAWN AEROBIC AND ANAEROBIC 10CC   Final    Culture  Setup Time [id number]   Final    Culture NO GROWTH 5 DAYS   Final    Report Status 09/16/2011 FINAL   Final   CULTURE, BLOOD (ROUTINE X 2)     Status: Normal   Collection Time   09/10/11  3:35 PM      Component Value Range Status Comment   Specimen Description BLOOD RIGHT ARM   Final    Special Requests BOTTLES DRAWN AEROBIC AND ANAEROBIC 10CC   Final    Culture  Setup Time [id number]   Final    Culture NO GROWTH 5 DAYS   Final    Report Status 09/16/2011 FINAL   Final   CULTURE, SPUTUM-ASSESSMENT     Status: Normal   Collection Time   09/11/11  6:36 PM      Component Value Range Status Comment   Specimen Description SPUTUM   Final    Special Requests NONE   Final    Sputum evaluation     Final    Value: MICROSCOPIC FINDINGS SUGGEST THAT THIS SPECIMEN IS NOT REPRESENTATIVE OF LOWER RESPIRATORY SECRETIONS. PLEASE RECOLLECT.   CALLED TO RN E.SMITH AT 1923 09/11/11 BY L.PITT   Report Status 09/11/2011 FINAL   Final   CULTURE, SPUTUM-ASSESSMENT     Status: Normal   Collection Time   09/14/11  1:38 AM      Component Value Range Status Comment   Specimen Description SPUTUM   Final    Special Requests NONE   Final    Sputum evaluation     Final    Value: MICROSCOPIC FINDINGS SUGGEST THAT THIS SPECIMEN IS NOT REPRESENTATIVE OF LOWER RESPIRATORY SECRETIONS. PLEASE RECOLLECT.     CALLED TO A.BOEHLING,RN 0248 09/14/11 M.CAMPBELL   Report Status 09/14/2011 FINAL   Final   CULTURE, SPUTUM-ASSESSMENT     Status: Normal   Collection Time   09/14/11  9:24 PM      Component Value Range Status Comment   Specimen Description SPUTUM   Final    Special Requests Normal   Final    Sputum evaluation     Final    Value: THIS SPECIMEN IS ACCEPTABLE. RESPIRATORY CULTURE REPORT TO FOLLOW.   Report Status 09/14/2011 FINAL   Final   CULTURE, RESPIRATORY  Status: Normal   Collection Time   09/14/11  9:24 PM      Component Value Range Status Comment   Specimen Description SPUTUM   Final    Special Requests NONE   Final    Gram Stain     Final    Value: RARE WBC PRESENT,BOTH PMN AND MONONUCLEAR     RARE SQUAMOUS EPITHELIAL CELLS PRESENT     NO ORGANISMS SEEN   Culture NORMAL OROPHARYNGEAL FLORA   Final    Report Status 09/17/2011 FINAL   Final     Medical History: Past Medical History  Diagnosis Date  . Chronic pain     back pain  . Stroke   . Hyperlipemia   . Lumbar disc disease   . Carotid artery disease     Medications:     Zosyn 3.375gm x 1 dose & Vancomycin 1gm x 1dose already given.  Tx to be continued with Maxipime / Vancomycin / Levaquin  Assessment:    R/O pneumonia.  SCr increasing (CrCl decreasing)  Goal of Therapy:     Will order initial doses of Maxipime and Levaquin.  Day shift clinical pharmacist to follow this morning for maintenance regimens.  Plan:   Maxipime 1gm x 1 dose & Levaquin 500mg  x 1 dose  ordered.  Shirley Muscat E 09/29/2011,5:53 AM

## 2011-09-29 NOTE — Discharge Instructions (Signed)
It is extremely important that you follow up as scheduled today.  If you develop any new, or concerning changes in your condition prior to that appointment, please return to the emergency department immediately.  Please continue taking your steroids and all the medication as directed.

## 2011-09-29 NOTE — ED Provider Notes (Addendum)
History     CSN: [id number]  Arrival date & time 09/28/11  2342   First MD Initiated Contact with Patient 09/28/11 2357      Chief Complaint  Patient presents with  . Shortness of Breath   HPI This patient presents with dyspnea.  He notes the symptoms began suddenly approximately 2 hours prior to arrival.  Symptoms are persistent in spite of increasing his home oxygen, and attempting additional home and nebulizer treatments.  The patient has a notable recent history of pneumonia complicated by NSTEMI, AKI.  He was discharged from: 2 weeks ago.  Since that time he has been on home oxygen.  He presented to this facility 2 days ago due to another episode of dyspnea and was discharged with steroids.  He notes that in the past 48 hours he has been essentially at baseline until his symptoms recurred. No new fevers.  The patient does endorse nausea, vomiting, chills. Past Medical History  Diagnosis Date  . Chronic pain     back pain  . Stroke   . Hyperlipemia   . Lumbar disc disease   . Carotid artery disease     Past Surgical History  Procedure Date  . Back surgery 4098,1191  . Knee arthroscopy 2002    rt  . Foreign body removal 06/05/2011    Procedure: FOREIGN BODY REMOVAL ADULT;  Surgeon: Rosalio Macadamia;  Location: [facility];  Service: Plastics;  Laterality: Right;  removal foreign body from right side face   . Carotid endarterectomy     No family history on file.  History  Substance Use Topics  . Smoking status: Former Smoker -- 2.0 packs/day for 30 years    Types: Cigarettes  . Smokeless tobacco: Not on file  . Alcohol Use: No      Review of Systems  Constitutional:       Per HPI, otherwise negative  HENT:       Per HPI, otherwise negative  Eyes: Negative.   Respiratory: Positive for shortness of breath. Negative for cough, choking and chest tightness.   Cardiovascular: Negative for chest pain, palpitations and leg swelling.       Per HPI,  otherwise negative  Gastrointestinal: Positive for nausea and vomiting. Negative for abdominal pain, diarrhea and constipation.  Genitourinary: Negative.   Musculoskeletal:       Per HPI, otherwise negative  Skin: Negative.   Neurological: Negative for syncope.    Allergies  Morphine and related and Other  Home Medications   Current Outpatient Rx  Name Route Sig Dispense Refill  . ALBUTEROL SULFATE HFA 108 (90 BASE) MCG/ACT IN AERS Inhalation Inhale 2 puffs into the lungs every 6 (six) hours as needed for wheezing. 1 Inhaler 2  . ALBUTEROL SULFATE HFA 108 (90 BASE) MCG/ACT IN AERS Inhalation Inhale 1-2 puffs into the lungs every 6 (six) hours as needed for wheezing. 1 Inhaler 0  . ASPIRIN 81 MG PO TBEC Oral Take 1 tablet (81 mg total) by mouth daily. 30 tablet 0  . CLOPIDOGREL BISULFATE 75 MG PO TABS Oral Take 1 tablet (75 mg total) by mouth daily with breakfast. 30 tablet 0  . CYCLOBENZAPRINE HCL 10 MG PO TABS Oral Take 10 mg by mouth 3 (three) times daily as needed.      Marland Kitchen HYDROCODONE-ACETAMINOPHEN 10-325 MG PO TABS Oral Take 1 tablet by mouth every 8 (eight) hours as needed. Pain    . METOPROLOL TARTRATE 50 MG PO TABS Oral  Take 1 tablet (50 mg total) by mouth 2 (two) times daily. 60 tablet 0  . PREDNISONE 10 MG PO TABS Oral Take 4 tablets (40 mg total) by mouth daily. 20 tablet 0    BP 118/78  Pulse 91  Resp 24  Ht 5\' 8"  (1.727 m)  Wt 190 lb (86.183 kg)  BMI 28.89 kg/m2  SpO2 92%  Physical Exam  Nursing note and vitals reviewed. Constitutional: He is oriented to person, place, and time. He appears well-developed. No distress.  HENT:  Head: Normocephalic and atraumatic.  Eyes: Conjunctivae and EOM are normal.  Cardiovascular: Normal rate and regular rhythm.   Pulmonary/Chest: Effort normal. No stridor. No respiratory distress.  Abdominal: He exhibits no distension.  Musculoskeletal: He exhibits no edema.  Neurological: He is alert and oriented to person, place, and time.   Skin: Skin is dry.       Skin is cool to touch  Psychiatric: He has a normal mood and affect.    ED Course  Procedures (including critical care time)   Labs Reviewed  CBC  DIFFERENTIAL  COMPREHENSIVE METABOLIC PANEL  TROPONIN I   Dg Chest Portable 1 View  09/29/2011  *RADIOLOGY REPORT*  Clinical Data: Shortness of breath.  PORTABLE CHEST - 1 VIEW  Comparison: 09/26/2011.  Findings: The heart is mildly enlarged but stable.  There is persistent peribronchial thickening, increased interstitial markings and patchy right upper lobe infiltrate.  Both lung bases are slightly better aerated with resolving effusions and atelectasis.  IMPRESSION: 1.  Improved basilar aeration. 2.  Persistent right upper lobe infiltrate. 3.  Stable bronchitic changes.  Original Report Authenticated By: P. Loralie Champagne, M.D.   X-ray reviewed by me Pulse oximetry 100% on nasal cannula abnormal Monitor 91 sinus rhythm normal   No diagnosis found.   Date: 09/29/2011  Rate: 92  Rhythm: normal sinus rhythm  QRS Axis: normal  Intervals: normal  ST/T Wave abnormalities: nonspecific T wave changes  Conduction Disutrbances:none  Narrative Interpretation:   Old EKG Reviewed: changes noted ANTERIOR T WAVE INVERSIONS ABNORMAL   MDM  This 56 year old male with a notable recent medical course presents with dyspnea.  On exam patient is anxious and is cool to the touch.  The patient ismoderately hypoxic and his saturations dropped into the mid 80s with minimal exertion.  Although the patient's x-ray is generally better than the film from 2 days ago, there is still a right upper lobe opacification.  Given the patient's hypoxia, this persistency, his poor clinical presentation and concern for ongoing pneumonia.  Patient is not grossly fluid overloaded, with no extremity edema, nor wheezing on exam.  The patient was provided ABX and admitted for further E/M.    Gerhard Munch, MD 09/29/11 0236  Gerhard Munch,  MD 09/29/11 1610

## 2011-09-29 NOTE — Consult Note (Signed)
Consult requested by: Dr. Orvan Falconer Consult requested for respiratory problems:  HPI: This is a 56 year old who has had a significant problem the last 3 months or so with a stroke, COPD and a non-ST elevation myocardial infarction acute renal failure with acute tubular necrosis and respiratory failure. He said he had done pretty well and was discharged from the hospital in [place] about 8 days ago but became increasingly weak had more cough and congestion. Chest x-ray shows some suggestion of interstitial lung disease as well as pneumonia  Past Medical History  Diagnosis Date  . Chronic pain     back pain  . Stroke   . Hyperlipemia   . Lumbar disc disease   . Carotid artery disease      Family History  Problem Relation Age of Onset  . Coronary artery disease Mother   . Coronary artery disease Father      History   Social History  . Marital Status: Legally Separated    Spouse Name: N/A    Number of Children: N/A  . Years of Education: N/A   Social History Main Topics  . Smoking status: Former Smoker -- 2.0 packs/day for 30 years    Types: Cigarettes  . Smokeless tobacco: None  . Alcohol Use: No  . Drug Use: No  . Sexually Active:    Other Topics Concern  . 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     ROS: He says he's had more shortness of breath recently. His cough has been mostly in the last 2 days. He says up until about 6 months ago he is unaware of any respiratory problems. He did stop smoking approximately a month ago but has a 60-pack-year smoking history    Objective: Vital signs in last 24 hours: Temp:  [98.4 F (36.9 C)] 98.4 F (36.9 C) (04/05 0616) Pulse Rate:  [25-99] 99  (04/05 0616) Resp:  [20-24] 24  (04/05 0616) BP: (109-122)/(68-87) 122/87 mmHg (04/05 0616) SpO2:  [77 %-97 %] 92 % (04/05 0616) Weight:  [86.183 kg (190 lb)-88 kg (194 lb 0.1 oz)] 88 kg (194 lb 0.1 oz) (04/05 0616) Weight  change:  Last BM Date: 09/28/11  Intake/Output from previous day:    PHYSICAL EXAM He is awake and alert. He looks relatively comfortable. He does have some increased respiratory rate. His chest shows bilateral rhonchi. His heart is regular. His abdomen is soft without masses. His extremities show no edema. Central nervous system examination is grossly intact  Lab Results: Basic Metabolic Panel:  Basename 09/29/11 0605 09/29/11 0012 09/26/11 2321  NA -- 135 135  K -- 4.9 4.2  CL -- 95* 95*  CO2 -- 23 26  GLUCOSE -- 164* 129*  BUN -- 46* 31*  CREATININE 2.21* 2.23* --  CALCIUM -- 9.5 9.5  MG 2.2 -- --  PHOS -- -- --   Liver Function Tests:  Basename 09/29/11 0012 09/26/11 2321  AST 50* 28  ALT 55* 34  ALKPHOS 82 83  BILITOT 0.7 0.7  PROT 7.9 7.9  ALBUMIN 3.8 3.8   No results found for this basename: LIPASE:2,AMYLASE:2 in the last 72 hours No results found for this basename: AMMONIA:2 in the last 72 hours CBC:  Basename 09/29/11 0605 09/29/11 0012 09/26/11 2321  WBC 16.6* 16.5* --  NEUTROABS -- 12.1* 8.2*  HGB 12.7* 13.3 --  HCT 38.5* 41.0 --  MCV 93.7 95.8 --  PLT  308 325 --   Cardiac Enzymes:  Basename 09/29/11 0605 09/29/11 0012 09/26/11 2321  CKTOTAL 65 -- --  CKMB 4.6* -- --  CKMBINDEX -- -- --  TROPONINI <0.30 <0.30 <0.30   BNP:  Basename 09/26/11 2321  PROBNP 17216.0*   D-Dimer: No results found for this basename: DDIMER:2 in the last 72 hours CBG: No results found for this basename: GLUCAP:6 in the last 72 hours Hemoglobin A1C: No results found for this basename: HGBA1C in the last 72 hours Fasting Lipid Panel: No results found for this basename: CHOL,HDL,LDLCALC,TRIG,CHOLHDL,LDLDIRECT in the last 72 hours Thyroid Function Tests: No results found for this basename: TSH,T4TOTAL,FREET4,T3FREE,THYROIDAB in the last 72 hours Anemia Panel: No results found for this basename: VITAMINB12,FOLATE,FERRITIN,TIBC,IRON,RETICCTPCT in the last 72  hours Coagulation: No results found for this basename: LABPROT:2,INR:2 in the last 72 hours Urine Drug Screen: Drugs of Abuse  No results found for this basename: labopia, cocainscrnur, labbenz, amphetmu, thcu, labbarb    Alcohol Level: No results found for this basename: ETH:2 in the last 72 hours Urinalysis: No results found for this basename: COLORURINE:2,APPERANCEUR:2,LABSPEC:2,PHURINE:2,GLUCOSEU:2,HGBUR:2,BILIRUBINUR:2,KETONESUR:2,PROTEINUR:2,UROBILINOGEN:2,NITRITE:2,LEUKOCYTESUR:2 in the last 72 hours Misc. Labs:   ABGS:  Basename 09/27/11 0040  PHART 7.439  PO2ART 65.2*  TCO2 21.4  HCO3 24.1*     MICROBIOLOGY: No results found for this or any previous visit (from the past 240 hour(s)).  Studies/Results: Dg Chest Portable 1 View  09/29/2011  *RADIOLOGY REPORT*  Clinical Data: Shortness of breath.  PORTABLE CHEST - 1 VIEW  Comparison: 09/26/2011.  Findings: The heart is mildly enlarged but stable.  There is persistent peribronchial thickening, increased interstitial markings and patchy right upper lobe infiltrate.  Both lung bases are slightly better aerated with resolving effusions and atelectasis.  IMPRESSION: 1.  Improved basilar aeration. 2.  Persistent right upper lobe infiltrate. 3.  Stable bronchitic changes.  Original Report Authenticated By: P. Loralie Champagne, M.D.    Medications:  Prior to Admission:  Prescriptions prior to admission  Medication Sig Dispense Refill  . albuterol (PROVENTIL HFA;VENTOLIN HFA) 108 (90 BASE) MCG/ACT inhaler Inhale 2 puffs into the lungs every 6 (six) hours as needed for wheezing.  1 Inhaler  2  . albuterol (PROVENTIL HFA;VENTOLIN HFA) 108 (90 BASE) MCG/ACT inhaler Inhale 1-2 puffs into the lungs every 6 (six) hours as needed for wheezing.  1 Inhaler  0  . aspirin EC 81 MG EC tablet Take 1 tablet (81 mg total) by mouth daily.  30 tablet  0  . clopidogrel (PLAVIX) 75 MG tablet Take 1 tablet (75 mg total) by mouth daily with breakfast.   30 tablet  0  . cyclobenzaprine (FLEXERIL) 10 MG tablet Take 10 mg by mouth 3 (three) times daily as needed.        Marland Kitchen HYDROcodone-acetaminophen (NORCO) 10-325 MG per tablet Take 1 tablet by mouth every 8 (eight) hours as needed. Pain      . metoprolol (LOPRESSOR) 50 MG tablet Take 1 tablet (50 mg total) by mouth 2 (two) times daily.  60 tablet  0  . predniSONE (DELTASONE) 10 MG tablet Take 4 tablets (40 mg total) by mouth daily.  20 tablet  0   Scheduled:   . albuterol  2.5 mg Nebulization Once  . aspirin EC  81 mg Oral Daily  . ceFEPime (MAXIPIME) IV  1 g Intravenous Once  . ceFEPime (MAXIPIME) IV  2 g Intravenous Q24H  . clopidogrel  75 mg Oral Q breakfast  . enoxaparin  40 mg Subcutaneous  Q24H  . ipratropium  0.5 mg Nebulization Once  . levofloxacin (LEVAQUIN) IV  500 mg Intravenous Once  . levofloxacin (LEVAQUIN) IV  750 mg Intravenous Q48H  . LORazepam  0.5 mg Intravenous Once  . metoprolol  50 mg Oral BID  . piperacillin-tazobactam (ZOSYN)  IV  3.375 g Intravenous Once  . sodium chloride  3 mL Intravenous Q12H  . vancomycin  1,250 mg Intravenous Q24H  . vancomycin  1,000 mg Intravenous Once   Continuous:  ZOX:[id number], acetaminophen, albuterol, bisacodyl, cyclobenzaprine, ipratropium, ondansetron (ZOFRAN) IV, ondansetron, oxyCODONE, sodium phosphate, traZODone  Assesment: He has multiple medical problems. He has healthcare associated pneumonia, COPD and a question of interstitial lung disease. He's had multiple evaluations in the last few months and I will review the pulmonary consultants evaluation and see if he needs further workup for interstitial lung disease. I agree with current antibiotic treatments. Active Problems:  Severe mitral regurgitation  Acute renal failure secondary to ATN versus acute interstitial nephritis  CAD (coronary artery disease)  Interstitial lung disease  HCAP (healthcare-associated pneumonia)  Chronic respiratory failure  COPD (chronic  obstructive pulmonary disease)    Plan: No change at present    LOS: 1 day   Lawanna Cecere L 09/29/2011, 9:01 AM

## 2011-09-29 NOTE — Evaluation (Signed)
Physical Therapy Evaluation Patient Details Name: Charles Savage MRN: [id number] DOB: 1956/06/12 Today's Date: 09/29/2011  Problem List:  Patient Active Problem List  Diagnoses  . NSTEMI (non-ST elevated myocardial infarction)  . History of CVA (cerebrovascular accident) without residual deficits  . S/P carotid endarterectomy  . Hyperlipemia  . Lumbar disc disease  . Carotid artery disease  . Acute respiratory failure with hypoxia  . Abnormal EKG  . Tobacco abuse  . 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 Severe mitral regurgitation  . Acute renal failure secondary to ATN versus acute interstitial nephritis  . CAD (coronary artery disease)  . Interstitial lung disease  . HCAP (healthcare-associated pneumonia)  . Chronic respiratory failure  . COPD (chronic obstructive pulmonary disease)    Past Medical History:  Past Medical History  Diagnosis Date  . Chronic pain     back pain  . Stroke   . Hyperlipemia   . Lumbar disc disease   . Carotid artery disease    Past Surgical History:  Past Surgical History  Procedure Date  . Back surgery 4098,1191  . Knee arthroscopy 2002    rt  . Foreign body removal 06/05/2011    Procedure: FOREIGN BODY REMOVAL ADULT;  Surgeon: Rosalio Macadamia;  Location: [facility];  Service: Plastics;  Laterality: Right;  removal foreign body from right side face   . Carotid endarterectomy     PT Assessment/Plan/Recommendation PT Assessment Clinical Impression Statement: Pt now with significant DOE, although O2 sats remain over 93% with all exertion.  His overall functional mobility is WNL and we focused on posture and energy conservation techniques.  He was instructed in a home exercise program of posture exercise, instructed in energy conservation strategies and was also instructed in gait with a walker to help minimize exertional level.  He felt that the walker did make easier for  him and will borrow one from a friend.  He has chronic back dysfunction and had actually anticipated having back surgery up until this recent illness.  At this point, agressive PT is probably not warranted. PT Recommendation/Assessment: Patent does not need any further PT services No Skilled PT: Patient at baseline level of functioning;All education completed PT Recommendation Follow Up Recommendations: No PT follow up Equipment Recommended: None recommended by PT (Pt to obtain a walker on his own) PT Goals     PT Evaluation Precautions/Restrictions  Precautions Required Braces or Orthoses: No Restrictions Weight Bearing Restrictions: No Prior Functioning  Home Living Bathroom Shower/Tub: Walk-in shower Bathroom Toilet: Standard Home Adaptive Equipment: Straight cane Prior Function Level of Independence: Independent with basic ADLs;Independent with homemaking with ambulation;Independent with gait;Independent with transfers Vocation: On disability Cognition Cognition Arousal/Alertness: Awake/alert Overall Cognitive Status: Appears within functional limits for tasks assessed Orientation Level: Oriented X4 Sensation/Coordination Sensation Light Touch: Appears Intact Stereognosis: Not tested Hot/Cold: Not tested Proprioception: Appears Intact Coordination Gross Motor Movements are Fluid and Coordinated: Yes Fine Motor Movements are Fluid and Coordinated: Yes Extremity Assessment RLE Assessment RLE Assessment: Within Functional Limits LLE Assessment LLE Assessment: Within Functional Limits Mobility (including Balance) Bed Mobility Supine to Sit: 7: Independent Sitting - Scoot to Edge of Bed: 7: Independent Sit to Supine: 7: Independent Transfers Sit to Stand: 7: Independent Stand to Sit: 7: Independent Stand Pivot Transfers: 7: Independent Stand Pivot Transfer Details (indicate cue type and reason): no LOB today Ambulation/Gait Ambulation/Gait Assistance: 6: Modified  independent (Device/Increase time) Ambulation/Gait Assistance Details (indicate  cue type and reason): Pt was given a walker to use for energy conservation ...has excellent stability with this Ambulation Distance (Feet): 150 Feet Assistive device: Rolling walker Gait Pattern: Within Functional Limits Gait velocity: WNL Wheelchair Mobility Wheelchair Mobility: No  Posture/Postural Control Posture/Postural Control: No significant limitations Static Standing Balance Static Standing - Level of Assistance: 7: Independent Dynamic Standing Balance Dynamic Standing - Level of Assistance: 5: Stand by assistance Exercise    End of Session PT - End of Session Equipment Utilized During Treatment: Gait belt Activity Tolerance: Patient tolerated treatment well Patient left: in bed Nurse Communication: Mobility status for transfers;Mobility status for ambulation General Behavior During Session: [facility] for tasks performed Cognition: [facility] for tasks performed  Konrad Penta 09/29/2011, 9:29 AM

## 2011-09-29 NOTE — Progress Notes (Signed)
Patient ambulated in the hallway with his wife and tolerated well.

## 2011-09-30 LAB — PROTIME-INR
INR: 1.5 — ABNORMAL HIGH (ref 0.00–1.49)
Prothrombin Time: 18.4 seconds — ABNORMAL HIGH (ref 11.6–15.2)

## 2011-09-30 LAB — CBC
MCH: 30.7 pg (ref 26.0–34.0)
MCHC: 32.8 g/dL (ref 30.0–36.0)
Platelets: 301 10*3/uL (ref 150–400)
RDW: 15.2 % (ref 11.5–15.5)

## 2011-09-30 LAB — COMPREHENSIVE METABOLIC PANEL
AST: 163 U/L — ABNORMAL HIGH (ref 0–37)
Albumin: 3.3 g/dL — ABNORMAL LOW (ref 3.5–5.2)
BUN: 53 mg/dL — ABNORMAL HIGH (ref 6–23)
Calcium: 8.9 mg/dL (ref 8.4–10.5)
Creatinine, Ser: 2.08 mg/dL — ABNORMAL HIGH (ref 0.50–1.35)
GFR calc non Af Amer: 34 mL/min — ABNORMAL LOW (ref >90)

## 2011-09-30 LAB — URINE MICROSCOPIC-ADD ON

## 2011-09-30 LAB — URINALYSIS, ROUTINE W REFLEX MICROSCOPIC
Bilirubin Urine: NEGATIVE
Ketones, ur: NEGATIVE mg/dL
Nitrite: NEGATIVE
Protein, ur: NEGATIVE mg/dL
Specific Gravity, Urine: 1.015 (ref 1.005–1.030)
Urobilinogen, UA: 0.2 mg/dL (ref 0.0–1.0)

## 2011-09-30 LAB — APTT: aPTT: 29 seconds (ref 24–37)

## 2011-09-30 MED ORDER — GUAIFENESIN ER 600 MG PO TB12
1200.0000 mg | ORAL_TABLET | Freq: Two times a day (BID) | ORAL | Status: DC
  Administered 2011-09-30 – 2011-10-02 (×5): 1200 mg via ORAL
  Filled 2011-09-30 (×5): qty 2

## 2011-09-30 MED ORDER — SODIUM CHLORIDE 0.9 % IJ SOLN
INTRAMUSCULAR | Status: AC
  Administered 2011-09-30: 3 mL
  Filled 2011-09-30: qty 3

## 2011-09-30 MED ORDER — SODIUM CHLORIDE 0.45 % IV SOLN
INTRAVENOUS | Status: AC
  Administered 2011-09-30: 18:00:00 via INTRAVENOUS

## 2011-09-30 MED ORDER — SODIUM CHLORIDE 0.9 % IJ SOLN
INTRAMUSCULAR | Status: AC
  Administered 2011-09-30: 3 mL via INTRAVENOUS
  Filled 2011-09-30: qty 3

## 2011-09-30 NOTE — Progress Notes (Signed)
Subjective: Feels breathing is a little better today, coughing, still feels short of breath  Objective: Vital signs in last 24 hours: Temp:  [96.1 F (35.6 C)-97.7 F (36.5 C)] 97.7 F (36.5 C) (04/06 1323) Pulse Rate:  [86-105] 105  (04/06 1323) Resp:  [17-20] 18  (04/06 1323) BP: (104-125)/(60-84) 125/79 mmHg (04/06 1323) SpO2:  [87 %-98 %] 98 % (04/06 1323) Weight:  [88.814 kg (195 lb 12.8 oz)] 88.814 kg (195 lb 12.8 oz) (04/06 0611) Weight change: 2.631 kg (5 lb 12.8 oz) Last BM Date: 09/29/11  Intake/Output from previous day: 04/05 0701 - 04/06 0700 In: 603 [P.O.:500; I.V.:3; IV Piggyback:100] Out: -  Total I/O In: 246 [P.O.:240; I.V.:6] Out: 1075 [Urine:1075]   Physical Exam: General: Alert, awake, oriented x3, in no acute distress. HEENT: No bruits, no goiter. Heart: Regular rate and rhythm, without murmurs, rubs, gallops. Lungs: occasional rhonchi Abdomen: Soft, nontender, nondistended, positive bowel sounds. Extremities: No clubbing cyanosis or edema with positive pedal pulses. Neuro: Grossly intact, nonfocal.    Lab Results: Basic Metabolic Panel:  Basename 09/30/11 0443 09/29/11 0605 09/29/11 0012  NA 136 -- 135  K 4.0 -- 4.9  CL 98 -- 95*  CO2 26 -- 23  GLUCOSE 105* -- 164*  BUN 53* -- 46*  CREATININE 2.08* 2.21* --  CALCIUM 8.9 -- 9.5  MG -- 2.2 --  PHOS -- -- --   Liver Function Tests:  Basename 09/30/11 0443 09/29/11 0012  AST 163* 50*  ALT 200* 55*  ALKPHOS 76 82  BILITOT 0.8 0.7  PROT 6.9 7.9  ALBUMIN 3.3* 3.8   No results found for this basename: LIPASE:2,AMYLASE:2 in the last 72 hours No results found for this basename: AMMONIA:2 in the last 72 hours CBC:  Basename 09/30/11 0443 09/29/11 0605 09/29/11 0012  WBC 14.1* 16.6* --  NEUTROABS -- -- 12.1*  HGB 13.1 12.7* --  HCT 40.0 38.5* --  MCV 93.7 93.7 --  PLT 301 308 --   Cardiac Enzymes:  Basename 09/29/11 2107 09/29/11 1327 09/29/11 0605  CKTOTAL 54 68 65  CKMB 4.6*  5.1* 4.6*  CKMBINDEX -- -- --  TROPONINI <0.30 <0.30 <0.30   BNP: No results found for this basename: PROBNP:3 in the last 72 hours D-Dimer: No results found for this basename: DDIMER:2 in the last 72 hours CBG: No results found for this basename: GLUCAP:6 in the last 72 hours Hemoglobin A1C: No results found for this basename: HGBA1C in the last 72 hours Fasting Lipid Panel: No results found for this basename: CHOL,HDL,LDLCALC,TRIG,CHOLHDL,LDLDIRECT in the last 72 hours Thyroid Function Tests:  Basename 09/29/11 1610  TSH 1.859  T4TOTAL --  Domingo Cocking --  T3FREE --  THYROIDAB --   Anemia Panel: No results found for this basename: VITAMINB12,FOLATE,FERRITIN,TIBC,IRON,RETICCTPCT in the last 72 hours Coagulation:  Basename 09/30/11 0443  LABPROT 18.4*  INR 1.50*   Urine Drug Screen: Drugs of Abuse  No results found for this basename: labopia, cocainscrnur, labbenz, amphetmu, thcu, labbarb    Alcohol Level: No results found for this basename: ETH:2 in the last 72 hours Urinalysis:  Basename 09/30/11 0711  COLORURINE YELLOW  LABSPEC 1.015  PHURINE 5.5  GLUCOSEU NEGATIVE  HGBUR TRACE*  BILIRUBINUR NEGATIVE  KETONESUR NEGATIVE  PROTEINUR NEGATIVE  UROBILINOGEN 0.2  NITRITE NEGATIVE  LEUKOCYTESUR NEGATIVE    No results found for this or any previous visit (from the past 240 hour(s)).  Studies/Results: Dg Chest Portable 1 View  09/29/2011  *RADIOLOGY REPORT*  Clinical Data:  Shortness of breath.  PORTABLE CHEST - 1 VIEW  Comparison: 09/26/2011.  Findings: The heart is mildly enlarged but stable.  There is persistent peribronchial thickening, increased interstitial markings and patchy right upper lobe infiltrate.  Both lung bases are slightly better aerated with resolving effusions and atelectasis.  IMPRESSION: 1.  Improved basilar aeration. 2.  Persistent right upper lobe infiltrate. 3.  Stable bronchitic changes.  Original Report Authenticated By: P. Loralie Champagne, M.D.      Medications: Scheduled Meds:   . aspirin EC  81 mg Oral Daily  . ceFEPime (MAXIPIME) IV  2 g Intravenous Q24H  . clopidogrel  75 mg Oral Q breakfast  . enoxaparin  40 mg Subcutaneous Q24H  . levofloxacin (LEVAQUIN) IV  750 mg Intravenous Q48H  . nicotine  21 mg Transdermal Daily  . sodium chloride  3 mL Intravenous Q12H  . sodium chloride      . vancomycin  1,250 mg Intravenous Q24H  . DISCONTD: metoprolol  50 mg Oral BID   Continuous Infusions:  PRN Meds:.acetaminophen, acetaminophen, albuterol, ALPRAZolam, bisacodyl, cyclobenzaprine, ipratropium, ondansetron (ZOFRAN) IV, ondansetron, oxyCODONE, sodium phosphate, traZODone, DISCONTD: albuterol  Assessment/Plan:  Active Problems:  Severe mitral regurgitation  Acute renal failure secondary to ATN versus acute interstitial nephritis  CAD (coronary artery disease)  Interstitial lung disease  HCAP (healthcare-associated pneumonia)  Chronic respiratory failure  COPD (chronic obstructive pulmonary disease)  1. HCAP. Patient is on broad spectrum antibiotics. He reports some improvement in his respiratory status.  He is afebrile. Pulmonology is following, cont current abx for now.   2. Acute renal failure, appears to be pre renal.  Will give short trial of IV fluids  3. Cardiomyopathy, watch for signs of volume overload  4. CAD with recent NSTEMI.  Further cardiac work up needed once renal status improved. Continue aspirin and plavix  5. Chronic respiratory failure, on home oxygen  6. Possible interstitial lung disease, will need outpatient PFTs and follow up with pulmonary  7. COPD, stable. Metoprolol held since it makes patient short of breath   LOS: 2 days   Annalyn Blecher [facility] Pager: [phone] 09/30/2011, 2:00 PM

## 2011-09-30 NOTE — Progress Notes (Signed)
Subjective: He says he feels better. He is still short of breath. He is not coughing anything up. I reviewed the information from his last hospitalization in [place] and I don't think he has had for evaluation for his presumed interstitial lung disease because he's not been able to be seen as an outpatient.  Objective: Vital signs in last 24 hours: Temp:  [96.1 F (35.6 C)-97.8 F (36.6 C)] 96.1 F (35.6 C) (04/05 2040) Pulse Rate:  [89-101] 101  (04/06 0611) Resp:  [17-24] 18  (04/06 0611) BP: (104-119)/(60-84) 119/84 mmHg (04/06 0611) SpO2:  [87 %-98 %] 97 % (04/06 0611) Weight:  [88.814 kg (195 lb 12.8 oz)] 88.814 kg (195 lb 12.8 oz) (04/06 0611) Weight change: 2.631 kg (5 lb 12.8 oz) Last BM Date: 09/29/11  Intake/Output from previous day: 04/05 0701 - 04/06 0700 In: 603 [P.O.:500; I.V.:3; IV Piggyback:100] Out: -   PHYSICAL EXAM General appearance: alert, cooperative and no distress Resp: rhonchi bilaterally Cardio: regular rate and rhythm, S1, S2 normal, no murmur, click, rub or gallop GI: soft, non-tender; bowel sounds normal; no masses,  no organomegaly Extremities: extremities normal, atraumatic, no cyanosis or edema  Lab Results:    Basic Metabolic Panel:  Basename 09/30/11 0443 09/29/11 0605 09/29/11 0012  NA 136 -- 135  K 4.0 -- 4.9  CL 98 -- 95*  CO2 26 -- 23  GLUCOSE 105* -- 164*  BUN 53* -- 46*  CREATININE 2.08* 2.21* --  CALCIUM 8.9 -- 9.5  MG -- 2.2 --  PHOS -- -- --   Liver Function Tests:  Basename 09/30/11 0443 09/29/11 0012  AST 163* 50*  ALT 200* 55*  ALKPHOS 76 82  BILITOT 0.8 0.7  PROT 6.9 7.9  ALBUMIN 3.3* 3.8   No results found for this basename: LIPASE:2,AMYLASE:2 in the last 72 hours No results found for this basename: AMMONIA:2 in the last 72 hours CBC:  Basename 09/30/11 0443 09/29/11 0605 09/29/11 0012  WBC 14.1* 16.6* --  NEUTROABS -- -- 12.1*  HGB 13.1 12.7* --  HCT 40.0 38.5* --  MCV 93.7 93.7 --  PLT 301 308 --     Cardiac Enzymes:  Basename 09/29/11 2107 09/29/11 1327 09/29/11 0605  CKTOTAL 54 68 65  CKMB 4.6* 5.1* 4.6*  CKMBINDEX -- -- --  TROPONINI <0.30 <0.30 <0.30   BNP: No results found for this basename: PROBNP:3 in the last 72 hours D-Dimer: No results found for this basename: DDIMER:2 in the last 72 hours CBG: No results found for this basename: GLUCAP:6 in the last 72 hours Hemoglobin A1C: No results found for this basename: HGBA1C in the last 72 hours Fasting Lipid Panel: No results found for this basename: CHOL,HDL,LDLCALC,TRIG,CHOLHDL,LDLDIRECT in the last 72 hours Thyroid Function Tests:  Basename 09/29/11 5409  TSH 1.859  T4TOTAL --  Domingo Cocking --  T3FREE --  THYROIDAB --   Anemia Panel: No results found for this basename: VITAMINB12,FOLATE,FERRITIN,TIBC,IRON,RETICCTPCT in the last 72 hours Coagulation:  Basename 09/30/11 0443  LABPROT 18.4*  INR 1.50*   Urine Drug Screen: Drugs of Abuse  No results found for this basename: labopia, cocainscrnur, labbenz, amphetmu, thcu, labbarb    Alcohol Level: No results found for this basename: ETH:2 in the last 72 hours Urinalysis:  Basename 09/30/11 0711  COLORURINE YELLOW  LABSPEC 1.015  PHURINE 5.5  GLUCOSEU NEGATIVE  HGBUR TRACE*  BILIRUBINUR NEGATIVE  KETONESUR NEGATIVE  PROTEINUR NEGATIVE  UROBILINOGEN 0.2  NITRITE NEGATIVE  LEUKOCYTESUR NEGATIVE   Misc. Labs:  ABGS No results found for this basename: PHART,PCO2,PO2ART,TCO2,HCO3 in the last 72 hours CULTURES No results found for this or any previous visit (from the past 240 hour(s)). Studies/Results: Dg Chest Portable 1 View  09/29/2011  *RADIOLOGY REPORT*  Clinical Data: Shortness of breath.  PORTABLE CHEST - 1 VIEW  Comparison: 09/26/2011.  Findings: The heart is mildly enlarged but stable.  There is persistent peribronchial thickening, increased interstitial markings and patchy right upper lobe infiltrate.  Both lung bases are slightly better aerated  with resolving effusions and atelectasis.  IMPRESSION: 1.  Improved basilar aeration. 2.  Persistent right upper lobe infiltrate. 3.  Stable bronchitic changes.  Original Report Authenticated By: P. Loralie Champagne, M.D.    Medications:  Scheduled:   . aspirin EC  81 mg Oral Daily  . ceFEPime (MAXIPIME) IV  2 g Intravenous Q24H  . clopidogrel  75 mg Oral Q breakfast  . enoxaparin  40 mg Subcutaneous Q24H  . levofloxacin (LEVAQUIN) IV  750 mg Intravenous Q48H  . nicotine  21 mg Transdermal Daily  . sodium chloride  3 mL Intravenous Q12H  . sodium chloride      . vancomycin  1,250 mg Intravenous Q24H  . DISCONTD: metoprolol  50 mg Oral BID   FAO:ZHYQMVHQIONGE, acetaminophen, albuterol, ALPRAZolam, bisacodyl, cyclobenzaprine, ipratropium, ondansetron (ZOFRAN) IV, ondansetron, oxyCODONE, sodium phosphate, traZODone, DISCONTD: albuterol  Assesment: He has healthcare associated pneumonia, COPD, and presumed interstitial lung disease. He also has multiple other medical problems including previous stroke coronary artery occlusive disease valvular heart disease and renal failure. Active Problems:  Severe mitral regurgitation  Acute renal failure secondary to ATN versus acute interstitial nephritis  CAD (coronary artery disease)  Interstitial lung disease  HCAP (healthcare-associated pneumonia)  Chronic respiratory failure  COPD (chronic obstructive pulmonary disease)    Plan: His current treatment is appropriate he will need to have pulmonary function testing and other evaluation for presumed interstitial lung disease once he is stable as an outpatient    LOS: 2 days   Charles Savage L 09/30/2011, 9:57 AM

## 2011-10-01 LAB — BASIC METABOLIC PANEL
CO2: 26 mEq/L (ref 19–32)
Calcium: 8.7 mg/dL (ref 8.4–10.5)
Chloride: 100 mEq/L (ref 96–112)
GFR calc Af Amer: 49 mL/min — ABNORMAL LOW (ref >90)
Sodium: 137 mEq/L (ref 135–145)

## 2011-10-01 LAB — CBC
Platelets: 252 10*3/uL (ref 150–400)
RBC: 4.37 MIL/uL (ref 4.22–5.81)
RDW: 15.3 % (ref 11.5–15.5)
WBC: 10.2 10*3/uL (ref 4.0–10.5)

## 2011-10-01 MED ORDER — LEVOFLOXACIN IN D5W 750 MG/150ML IV SOLN
750.0000 mg | INTRAVENOUS | Status: DC
  Filled 2011-10-01: qty 150

## 2011-10-01 MED ORDER — CARVEDILOL 3.125 MG PO TABS
3.1250 mg | ORAL_TABLET | Freq: Two times a day (BID) | ORAL | Status: DC
  Administered 2011-10-01 – 2011-10-02 (×2): 3.125 mg via ORAL
  Filled 2011-10-01 (×2): qty 1

## 2011-10-01 MED ORDER — MOXIFLOXACIN HCL 400 MG PO TABS
400.0000 mg | ORAL_TABLET | Freq: Every day | ORAL | Status: DC
  Administered 2011-10-01: 400 mg via ORAL
  Filled 2011-10-01: qty 1

## 2011-10-01 NOTE — Progress Notes (Signed)
ANTIBIOTIC CONSULT NOTE  Pharmacy Consult for Cefepime, Vancomycin, Levofloxacin Indication: R/O pneumonia  Allergies  Allergen Reactions  . Morphine And Related Hives and Itching  . Other Other (See Comments)    Peaches cause hives and itching    Patient Measurements: Height: 5\' 8"  (172.7 cm) Weight: 191 lb 3.2 oz (86.728 kg) IBW/kg (Calculated) : 68.4  Adjusted Body Weight: 74 kg  Vital Signs: Temp: 98.5 F (36.9 C) (04/07 0715) Temp src: Oral (04/07 0715) BP: 109/72 mmHg (04/07 0715) Pulse Rate: 113  (04/07 0929) Intake/Output from previous day: 04/06 0701 - 04/07 0700 In: 1336 [P.O.:580; I.V.:6; IV Piggyback:750] Out: 1825 [Urine:1825] Intake/Output from this shift: Total I/O In: 175 [P.O.:175] Out: 750 [Urine:750]  Labs:  [facility] 10/01/11 0529 09/30/11 0443 09/29/11 0605  WBC 10.2 14.1* 16.6*  HGB 13.5 13.1 12.7*  PLT 252 301 308  LABCREA -- -- --  CREATININE 1.74* 2.08* 2.21*   Estimated Creatinine Clearance: 50.8 ml/min (by C-G formula based on Cr of 1.74).  Microbiology: Recent Results (from the past 720 hour(s))  CULTURE, BLOOD (ROUTINE X 2)     Status: Normal   Collection Time   09/10/11  3:34 PM      Component Value Range Status Comment   Specimen Description BLOOD RIGHT HAND   Final    Special Requests BOTTLES DRAWN AEROBIC AND ANAEROBIC 10CC   Final    Culture  Setup Time [id number]   Final    Culture NO GROWTH 5 DAYS   Final    Report Status 09/16/2011 FINAL   Final   CULTURE, BLOOD (ROUTINE X 2)     Status: Normal   Collection Time   09/10/11  3:35 PM      Component Value Range Status Comment   Specimen Description BLOOD RIGHT ARM   Final    Special Requests BOTTLES DRAWN AEROBIC AND ANAEROBIC 10CC   Final    Culture  Setup Time [id number]   Final    Culture NO GROWTH 5 DAYS   Final    Report Status 09/16/2011 FINAL   Final   CULTURE, SPUTUM-ASSESSMENT     Status: Normal   Collection Time   09/11/11  6:36 PM      Component Value Range  Status Comment   Specimen Description SPUTUM   Final    Special Requests NONE   Final    Sputum evaluation     Final    Value: MICROSCOPIC FINDINGS SUGGEST THAT THIS SPECIMEN IS NOT REPRESENTATIVE OF LOWER RESPIRATORY SECRETIONS. PLEASE RECOLLECT.     CALLED TO RN E.SMITH AT 1923 09/11/11 BY L.PITT   Report Status 09/11/2011 FINAL   Final   CULTURE, SPUTUM-ASSESSMENT     Status: Normal   Collection Time   09/14/11  1:38 AM      Component Value Range Status Comment   Specimen Description SPUTUM   Final    Special Requests NONE   Final    Sputum evaluation     Final    Value: MICROSCOPIC FINDINGS SUGGEST THAT THIS SPECIMEN IS NOT REPRESENTATIVE OF LOWER RESPIRATORY SECRETIONS. PLEASE RECOLLECT.     CALLED TO A.BOEHLING,RN 0248 09/14/11 M.CAMPBELL   Report Status 09/14/2011 FINAL   Final   CULTURE, SPUTUM-ASSESSMENT     Status: Normal   Collection Time   09/14/11  9:24 PM      Component Value Range Status Comment   Specimen Description SPUTUM   Final    Special Requests Normal   Final  Sputum evaluation     Final    Value: THIS SPECIMEN IS ACCEPTABLE. RESPIRATORY CULTURE REPORT TO FOLLOW.   Report Status 09/14/2011 FINAL   Final   CULTURE, RESPIRATORY     Status: Normal   Collection Time   09/14/11  9:24 PM      Component Value Range Status Comment   Specimen Description SPUTUM   Final    Special Requests NONE   Final    Gram Stain     Final    Value: RARE WBC PRESENT,BOTH PMN AND MONONUCLEAR     RARE SQUAMOUS EPITHELIAL CELLS PRESENT     NO ORGANISMS SEEN   Culture NORMAL OROPHARYNGEAL FLORA   Final    Report Status 09/17/2011 FINAL   Final    Medical History: Past Medical History  Diagnosis Date  . Chronic pain     back pain  . Stroke   . Hyperlipemia   . Lumbar disc disease   . Carotid artery disease    Medications:     . aspirin EC  81 mg Oral Daily  . ceFEPime (MAXIPIME) IV  2 g Intravenous Q24H  . clopidogrel  75 mg Oral Q breakfast  . enoxaparin  40 mg  Subcutaneous Q24H  . guaiFENesin  1,200 mg Oral BID  . levofloxacin (LEVAQUIN) IV  750 mg Intravenous Q48H  . nicotine  21 mg Transdermal Daily  . sodium chloride  3 mL Intravenous Q12H  . vancomycin  1,250 mg Intravenous Q24H   Assessment: Okay for Protocol SCr improving Estimated Creatinine Clearance: 50.8 ml/min (by C-G formula based on Cr of 1.74).  Goal of Therapy:  Vanc trough 15-20. Eradicate infection.  Plan:  Cefepime 2gm IV every 24 hours. Levaquin 750mg  IV every 24 hours Vancomycin 1250mg  IV every 24 hours. Check trough tomorrow DE-ESCALATE ABX when appropriate F/U Micro data.  Margo Aye, Sidra Oldfield A 10/01/2011,9:38 AM

## 2011-10-01 NOTE — Progress Notes (Signed)
Patient ambulated in hallway with wife. Oxygen tank carried at 2L [place]. Patient tolerated well.

## 2011-10-01 NOTE — Progress Notes (Signed)
Subjective: Patient feels better today.  His breathing is improving.  He is still coughing a fair amount of sputum  Objective: Vital signs in last 24 hours: Temp:  [97.4 F (36.3 C)-98.5 F (36.9 C)] 97.4 F (36.3 C) (04/07 1001) Pulse Rate:  [105-113] 112  (04/07 1001) Resp:  [18-24] 20  (04/07 1001) BP: (109-136)/(72-84) 131/80 mmHg (04/07 1001) SpO2:  [90 %-99 %] 90 % (04/07 1001) Weight:  [86.728 kg (191 lb 3.2 oz)] 86.728 kg (191 lb 3.2 oz) (04/07 0500) Weight change: -2.087 kg (-4 lb 9.6 oz) Last BM Date: 09/30/11  Intake/Output from previous day: 04/06 0701 - 04/07 0700 In: 1336 [P.O.:580; I.V.:6; IV Piggyback:750] Out: 1825 [Urine:1825] Total I/O In: 178 [P.O.:175; I.V.:3] Out: 750 [Urine:750]   Physical Exam: General: Alert, awake, oriented x3, in no acute distress. HEENT: No bruits, no goiter. Heart: Regular rate and rhythm, without murmurs, rubs, gallops. Lungs: rhonchi at bases more on left. Abdomen: Soft, nontender, nondistended, positive bowel sounds. Extremities: 1+ edema b/l Neuro: Grossly intact, nonfocal.    Lab Results: Basic Metabolic Panel:  Basename 10/01/11 0529 09/30/11 0443 09/29/11 0605  NA 137 136 --  K 3.6 4.0 --  CL 100 98 --  CO2 26 26 --  GLUCOSE 90 105* --  BUN 37* 53* --  CREATININE 1.74* 2.08* --  CALCIUM 8.7 8.9 --  MG -- -- 2.2  PHOS -- -- --   Liver Function Tests:  Basename 09/30/11 0443 09/29/11 0012  AST 163* 50*  ALT 200* 55*  ALKPHOS 76 82  BILITOT 0.8 0.7  PROT 6.9 7.9  ALBUMIN 3.3* 3.8   No results found for this basename: LIPASE:2,AMYLASE:2 in the last 72 hours No results found for this basename: AMMONIA:2 in the last 72 hours CBC:  Basename 10/01/11 0529 09/30/11 0443 09/29/11 0012  WBC 10.2 14.1* --  NEUTROABS -- -- 12.1*  HGB 13.5 13.1 --  HCT 41.0 40.0 --  MCV 93.8 93.7 --  PLT 252 301 --   Cardiac Enzymes:  Basename 09/29/11 2107 09/29/11 1327 09/29/11 0605  CKTOTAL 54 68 65  CKMB 4.6* 5.1*  4.6*  CKMBINDEX -- -- --  TROPONINI <0.30 <0.30 <0.30   BNP: No results found for this basename: PROBNP:3 in the last 72 hours D-Dimer: No results found for this basename: DDIMER:2 in the last 72 hours CBG: No results found for this basename: GLUCAP:6 in the last 72 hours Hemoglobin A1C: No results found for this basename: HGBA1C in the last 72 hours Fasting Lipid Panel: No results found for this basename: CHOL,HDL,LDLCALC,TRIG,CHOLHDL,LDLDIRECT in the last 72 hours Thyroid Function Tests:  Basename 09/29/11 6213  TSH 1.859  T4TOTAL --  Domingo Cocking --  T3FREE --  THYROIDAB --   Anemia Panel: No results found for this basename: VITAMINB12,FOLATE,FERRITIN,TIBC,IRON,RETICCTPCT in the last 72 hours Coagulation:  Basename 09/30/11 0443  LABPROT 18.4*  INR 1.50*   Urine Drug Screen: Drugs of Abuse  No results found for this basename: labopia, cocainscrnur, labbenz, amphetmu, thcu, labbarb    Alcohol Level: No results found for this basename: ETH:2 in the last 72 hours Urinalysis:  Basename 09/30/11 0711  COLORURINE YELLOW  LABSPEC 1.015  PHURINE 5.5  GLUCOSEU NEGATIVE  HGBUR TRACE*  BILIRUBINUR NEGATIVE  KETONESUR NEGATIVE  PROTEINUR NEGATIVE  UROBILINOGEN 0.2  NITRITE NEGATIVE  LEUKOCYTESUR NEGATIVE    No results found for this or any previous visit (from the past 240 hour(s)).  Studies/Results: No results found.  Medications: Scheduled Meds:   .  aspirin EC  81 mg Oral Daily  . ceFEPime (MAXIPIME) IV  2 g Intravenous Q24H  . clopidogrel  75 mg Oral Q breakfast  . enoxaparin  40 mg Subcutaneous Q24H  . guaiFENesin  1,200 mg Oral BID  . levofloxacin (LEVAQUIN) IV  750 mg Intravenous Q24H  . nicotine  21 mg Transdermal Daily  . sodium chloride  3 mL Intravenous Q12H  . vancomycin  1,250 mg Intravenous Q24H  . DISCONTD: levofloxacin (LEVAQUIN) IV  750 mg Intravenous Q48H   Continuous Infusions:   . sodium chloride 75 mL/hr at 09/30/11 1758   PRN  Meds:.acetaminophen, acetaminophen, albuterol, ALPRAZolam, bisacodyl, cyclobenzaprine, ipratropium, ondansetron (ZOFRAN) IV, ondansetron, oxyCODONE, traZODone  Assessment/Plan:  Active Problems:  Severe mitral regurgitation  Acute renal failure secondary to ATN versus acute interstitial nephritis  CAD (coronary artery disease)  Interstitial lung disease  HCAP (healthcare-associated pneumonia)  Chronic respiratory failure  COPD (chronic obstructive pulmonary disease)  Plan:  This gentleman was admitted to the hospital with worsening shortness of breath.  He was recently at [facility] and was treated for a pneumonia.  He also suffered an NSTEMI, was found to have a cardiomyopathy (likely ischemic) as well as suffered acute renal failure due to ATN. He was admitted back to [facility] due to worsening shortness of breath. He was found to have a new pneumonia on his x-ray.  #1 healthcare acquired pneumonia. Patient is on broad-spectrum antibiotics and has had improvement. He has been afebrile. We will change to oral antibiotics.  #2. Acute renal failure. Mild improvement with IV fluids. I am reluctant to give him more IV fluids due to his cardiomyopathy and risk for volume overload. He is encouraged to take fluids by mouth. Once his renal issues are improved he will need further cardiac evaluation with an angiogram.  #3. Cardiomyopathy. Ejection fraction 35-40%. Watch for signs of volume overload.  #4. Coronary artery disease with recent non-ST elevation MI. Continue aspirin and Plavix for now. Further cardiac workup needed once renal status improves.  #5. Chronic respiratory failure on home oxygen.  #6. Possible interstitial lung disease. He will need outpatient pulmonary function tests and follow up with a  pulmonologist once he is stable.  #7. COPD. This is stable. Patient has significant shortness of breath with metoprolol. Due to his tachycardia and underlying  cardiomyopathy he would benefit from a beta blocker. We will try low dose Coreg and see how he responds.   If he does not have any further fevers, and his respiratory status continues to improve, then plan will be to discharge him home tomorrow.   LOS: 3 days   Charles Savage [facility] Pager: [phone] 10/01/2011, 10:06 AM

## 2011-10-02 LAB — CBC
MCV: 94.8 fL (ref 78.0–100.0)
Platelets: 227 10*3/uL (ref 150–400)
RBC: 4.04 MIL/uL — ABNORMAL LOW (ref 4.22–5.81)
RDW: 15.5 % (ref 11.5–15.5)
WBC: 9.1 10*3/uL (ref 4.0–10.5)

## 2011-10-02 LAB — BASIC METABOLIC PANEL
CO2: 28 mEq/L (ref 19–32)
Calcium: 8.8 mg/dL (ref 8.4–10.5)
Chloride: 101 mEq/L (ref 96–112)
Creatinine, Ser: 1.55 mg/dL — ABNORMAL HIGH (ref 0.50–1.35)
GFR calc Af Amer: 56 mL/min — ABNORMAL LOW (ref >90)
Sodium: 138 mEq/L (ref 135–145)

## 2011-10-02 LAB — VANCOMYCIN, RANDOM: Vancomycin Rm: 10.9 ug/mL

## 2011-10-02 MED ORDER — CARVEDILOL 3.125 MG PO TABS: 3.1250 mg | ORAL_TABLET | Freq: Two times a day (BID) | ORAL | Status: DC

## 2011-10-02 MED ORDER — GUAIFENESIN ER 600 MG PO TB12: 1200.0000 mg | ORAL_TABLET | Freq: Two times a day (BID) | ORAL | Status: DC

## 2011-10-02 MED ORDER — MOXIFLOXACIN HCL 400 MG PO TABS: 400.0000 mg | ORAL_TABLET | Freq: Every day | ORAL | Status: AC

## 2011-10-02 MED ORDER — ALPRAZOLAM 0.25 MG PO TABS: 0.2500 mg | ORAL_TABLET | Freq: Three times a day (TID) | ORAL | Status: AC | PRN

## 2011-10-02 NOTE — Progress Notes (Signed)
   CARE MANAGEMENT NOTE 10/02/2011  Patient:  ABDALRAHMAN, CLEMENTSON   Account Number:  [id number]  Date Initiated:  09/29/2011  Documentation initiated by:  Rosemary Holms  Subjective/Objective Assessment:   pt admitted from home with spouse. Admitted with PNA. No previous HH needed.     Action/Plan:   Spoke with pt at bedside. If HH/DME is ordered, pt chose [facility]   Anticipated DC Date:  10/02/2011   Anticipated DC Plan:  HOME/SELF CARE      DC Planning Services  CM consult      Choice offered to / List presented to:             Status of service:  Completed, signed off Medicare Important Message given?   (If response is "NO", the following Medicare IM given date fields will be blank) Date Medicare IM given:   Date Additional Medicare IM given:    Discharge Disposition:  HOME/SELF CARE  Per UR Regulation:    If discussed at Long Length of Stay Meetings, dates discussed:    Comments:  10/02/11 900 Stamatia Masri RN BSN CM DC'd, declined HH DME  09/29/11 1100 Dajia Gunnels Leanord Hawking RN BSN CM If HH needed, please call orders to [facility] @ [phone]

## 2011-10-02 NOTE — Progress Notes (Signed)
Subjective: He says he feels okay. He is still coughing some. He is on oral antibiotics now and is doing all right.  Objective: Vital signs in last 24 hours: Temp:  [97.4 F (36.3 C)-98.3 F (36.8 C)] 98.2 F (36.8 C) (04/08 0513) Pulse Rate:  [101-113] 103  (04/08 0513) Resp:  [18-24] 18  (04/08 0513) BP: (108-131)/(67-84) 108/67 mmHg (04/08 0513) SpO2:  [88 %-97 %] 97 % (04/08 0513) Weight:  [86.773 kg (191 lb 4.8 oz)] 86.773 kg (191 lb 4.8 oz) (04/08 0513) Weight change: 0.045 kg (1.6 oz) Last BM Date: 09/30/11  Intake/Output from previous day: 04/07 0701 - 04/08 0700 In: 838 [P.O.:835; I.V.:3] Out: 2525 [Urine:2525]  PHYSICAL EXAM General appearance: alert, cooperative and no distress Resp: rhonchi bilaterally Cardio: regular rate and rhythm, S1, S2 normal, no murmur, click, rub or gallop GI: soft, non-tender; bowel sounds normal; no masses,  no organomegaly Extremities: extremities normal, atraumatic, no cyanosis or edema  Lab Results:    Basic Metabolic Panel:  Basename 10/02/11 0319 10/01/11 0529  NA 138 137  K 3.9 3.6  CL 101 100  CO2 28 26  GLUCOSE 86 90  BUN 25* 37*  CREATININE 1.55* 1.74*  CALCIUM 8.8 8.7  MG -- --  PHOS -- --   Liver Function Tests:  [facility] 09/30/11 0443  AST 163*  ALT 200*  ALKPHOS 76  BILITOT 0.8  PROT 6.9  ALBUMIN 3.3*   No results found for this basename: LIPASE:2,AMYLASE:2 in the last 72 hours No results found for this basename: AMMONIA:2 in the last 72 hours CBC:  Basename 10/02/11 0319 10/01/11 0529  WBC 9.1 10.2  NEUTROABS -- --  HGB 12.3* 13.5  HCT 38.3* 41.0  MCV 94.8 93.8  PLT 227 252   Cardiac Enzymes:  Basename 09/29/11 2107 09/29/11 1327  CKTOTAL 54 68  CKMB 4.6* 5.1*  CKMBINDEX -- --  TROPONINI <0.30 <0.30   BNP: No results found for this basename: PROBNP:3 in the last 72 hours D-Dimer: No results found for this basename: DDIMER:2 in the last 72 hours CBG: No results found for this  basename: GLUCAP:6 in the last 72 hours Hemoglobin A1C: No results found for this basename: HGBA1C in the last 72 hours Fasting Lipid Panel: No results found for this basename: CHOL,HDL,LDLCALC,TRIG,CHOLHDL,LDLDIRECT in the last 72 hours Thyroid Function Tests: No results found for this basename: TSH,T4TOTAL,FREET4,T3FREE,THYROIDAB in the last 72 hours Anemia Panel: No results found for this basename: VITAMINB12,FOLATE,FERRITIN,TIBC,IRON,RETICCTPCT in the last 72 hours Coagulation:  Basename 09/30/11 0443  LABPROT 18.4*  INR 1.50*   Urine Drug Screen: Drugs of Abuse  No results found for this basename: labopia, cocainscrnur, labbenz, amphetmu, thcu, labbarb    Alcohol Level: No results found for this basename: ETH:2 in the last 72 hours Urinalysis:  Basename 09/30/11 0711  COLORURINE YELLOW  LABSPEC 1.015  PHURINE 5.5  GLUCOSEU NEGATIVE  HGBUR TRACE*  BILIRUBINUR NEGATIVE  KETONESUR NEGATIVE  PROTEINUR NEGATIVE  UROBILINOGEN 0.2  NITRITE NEGATIVE  LEUKOCYTESUR NEGATIVE   Misc. Labs:  ABGS No results found for this basename: PHART,PCO2,PO2ART,TCO2,HCO3 in the last 72 hours CULTURES No results found for this or any previous visit (from the past 240 hour(s)). Studies/Results: No results found.  Medications:  Scheduled:   . aspirin EC  81 mg Oral Daily  . carvedilol  3.125 mg Oral BID WC  . clopidogrel  75 mg Oral Q breakfast  . enoxaparin  40 mg Subcutaneous Q24H  . guaiFENesin  1,200 mg Oral  BID  . moxifloxacin  400 mg Oral q1800  . nicotine  21 mg Transdermal Daily  . sodium chloride  3 mL Intravenous Q12H  . DISCONTD: ceFEPime (MAXIPIME) IV  2 g Intravenous Q24H  . DISCONTD: levofloxacin (LEVAQUIN) IV  750 mg Intravenous Q48H  . DISCONTD: levofloxacin (LEVAQUIN) IV  750 mg Intravenous Q24H  . DISCONTD: vancomycin  1,250 mg Intravenous Q24H   Continuous:  QMV:HQIONGEXBMWUX, acetaminophen, albuterol, ALPRAZolam, bisacodyl, cyclobenzaprine, ipratropium,  ondansetron (ZOFRAN) IV, ondansetron, oxyCODONE, traZODone  Assesment: He is doing okay. He has healthcare associated pneumonia. He may have interstitial lung disease which has not been fully evaluated Active Problems:  Severe mitral regurgitation  Acute renal failure secondary to ATN versus acute interstitial nephritis  CAD (coronary artery disease)  Interstitial lung disease  HCAP (healthcare-associated pneumonia)  Chronic respiratory failure  COPD (chronic obstructive pulmonary disease)    Plan: I will plan to sign off because I think is close to discharge. He will need to have a pulmonary function test as an outpatient once he is better. This could be done here or if he prefers he can follow with the pulmonary physicians at [facility]  Thank you for allowing me to see him with you    LOS: 4 days   Charles Savage L 10/02/2011, 8:33 AM

## 2011-10-02 NOTE — Discharge Summary (Signed)
Physician Discharge Summary  Patient ID: Charles Savage MRN: [id number] DOB/AGE: 1956/01/29 56 y.o. Primary Care Physician:GOLDING,JOHN CABOT, MD, MD Admit date: 09/28/2011 Discharge date: 10/02/2011    Discharge Diagnoses:  1. Healthcare acquired pneumonia, clinically improving. 2. Acute renal failure, improving. 3. Cardiomyopathy, ejection fraction 35-40%. Moderate to severe mitral regurgitation. 4. Coronary artery disease with recent non-ST elevation MI. 5. COPD on home oxygen. 6. Possible interstitial lung disease. 7. Elevated liver enzymes, will need followup.   Medication List  As of 10/02/2011  9:43 AM   STOP taking these medications         metoprolol 50 MG tablet      predniSONE 10 MG tablet         TAKE these medications         albuterol 108 (90 BASE) MCG/ACT inhaler   Commonly known as: PROVENTIL HFA;VENTOLIN HFA   Inhale 1-2 puffs into the lungs every 6 (six) hours as needed for wheezing.      ALPRAZolam 0.25 MG tablet   Commonly known as: XANAX   Take 1 tablet (0.25 mg total) by mouth 3 (three) times daily as needed for anxiety.      aspirin 81 MG EC tablet   Take 1 tablet (81 mg total) by mouth daily.      carvedilol 3.125 MG tablet   Commonly known as: COREG   Take 1 tablet (3.125 mg total) by mouth 2 (two) times daily with a meal.      clopidogrel 75 MG tablet   Commonly known as: PLAVIX   Take 1 tablet (75 mg total) by mouth daily with breakfast.      cyclobenzaprine 10 MG tablet   Commonly known as: FLEXERIL   Take 10 mg by mouth 3 (three) times daily as needed.      guaiFENesin 600 MG 12 hr tablet   Commonly known as: MUCINEX   Take 2 tablets (1,200 mg total) by mouth 2 (two) times daily.      HYDROcodone-acetaminophen 10-325 MG per tablet   Commonly known as: NORCO   Take 1 tablet by mouth every 8 (eight) hours as needed. Pain      moxifloxacin 400 MG tablet   Commonly known as: AVELOX   Take 1 tablet (400 mg total) by mouth daily at 6  PM.      nicotine 14 mg/24hr patch   Commonly known as: NICODERM CQ - dosed in mg/24 hours   Place 1 patch onto the skin daily.            Discharged Condition: Stable and improved.    Consults: Pulmonology, Dr. Juanetta Gosling.  Significant Diagnostic Studies: Dg Chest 2 View  09/19/2011  *RADIOLOGY REPORT*  Clinical Data: Short of breath.  Pneumonia  CHEST - 2 VIEW  Comparison: 09/13/2011  Findings: Right upper lobe infiltrate shows mild progression. Patchy bibasilar airspace disease is unchanged.  Bilateral pleural effusions, with progression on the left.  Underlying COPD.  IMPRESSION: Mild progression right upper lobe infiltrate.  This is suspicious for pneumonia.  Bibasilar atelectasis/infiltrate is unchanged.  Bilateral pleural effusions, with progression of the left pleural effusion.  Original Report Authenticated By: Camelia Phenes, M.D.   Dg Chest 2 View  09/13/2011  *RADIOLOGY REPORT*  Clinical Data: 56 year old male with cough and shortness of breath.  CHEST - 2 VIEW  Comparison: 09/10/2011 and earlier.  Findings: Widespread nodular airspace opacity re-identified.  Most confluent areas are in the right upper lobe and at both  lung bases. No significant improvement since 09/10/2011.  Stable cardiac size and mediastinal contours.  Visualized tracheal air column is within normal limits.  Small pleural effusions.  No pneumothorax.  IMPRESSION: Widespread pulmonary opacity compatible with bilateral pneumonia without improvement since 09/10/2011.  Small pleural effusions.  Original Report Authenticated By: Harley Hallmark, M.D.   Ct Chest Wo Contrast  09/19/2011  *RADIOLOGY REPORT*  Clinical Data: Evaluate pneumonia.  Chest pain.  Short of breath.  CT CHEST WITHOUT CONTRAST  Technique:  Multidetector CT imaging of the chest was performed following the standard protocol without IV contrast.  Comparison: 09/19/2011 radiograph.  CT chest 02/16/2011.  Findings: Age advanced severe coronary artery  atherosclerosis is present. If office based assessment of coronary risk factors has not been performed, it is now recommended.  Incidental imaging of the abdomen demonstrates that nodular contour of the liver suggesting hepatic cirrhosis.  Old granulomatous disease of the liver and cholelithiasis is also noted.  Moderate right greater than left bilateral pleural effusions are present which layer dependently.  The lungs demonstrate both paraseptal and centrilobular emphysema. Mild enlargement of the heart with a tiny pericardial effusion.  There is no axillary adenopathy.  Mild mediastinal adenopathy is present with enlargement of low right paratracheal lymph node that continues to demonstrate a normal fatty hilum.  This is probably congestive or reactive.  Bilateral upper lobe airspace disease is present compatible with multi focal pneumonia. Dense consolidation in the right upper lobe with air bronchograms.  Airspace opacification is multi focal. Collapse / consolidation of both lower lobes associated with pleural effusions.  Calcified granuloma are present in the lower lobes bilaterally.  There are no aggressive osseous lesions identified.  Mild thoracic spondylosis.  IMPRESSION: 1.  Moderate bilateral right greater than left pleural effusions layering dependently with associated compressive atelectasis. 2.  Emphysema with bilateral upper lobe airspace disease most consistent with pneumonia; in a patient with prior CT evidence of interstitial lung disease, this could represent acute interstitial pneumonitis however pneumonia is favored. 3.  Mild cardiomegaly and age advanced atherosclerotic coronary artery disease. 4.  Old granulomatous disease. 5.  Probable hepatic cirrhosis. 6.  Cholelithiasis. 7.  Right middle lobe pulmonary nodule noted on prior exam 02/16/2011 not well visualized.  Original Report Authenticated By: Andreas Newport, M.D.   US Renal  09/12/2011  *RADIOLOGY REPORT*  Clinical Data:  Worsening  renal failure  RENAL/URINARY TRACT ULTRASOUND COMPLETE  Comparison:  None.  Findings:  Right Kidney:  12.3 cm.  Normal in size and parenchymal echogenicity.  No evidence of mass or hydronephrosis.  Left Kidney:  12.0 cm.  Normal in size and parenchymal echogenicity.  No evidence of mass or hydronephrosis.  Bladder:  Appears normal for degree of bladder distention.  IMPRESSION: No evidence for obstructive uropathy.  Original Report Authenticated By: Rosealee Albee, M.D.   Dg Chest Portable 1 View  09/29/2011  *RADIOLOGY REPORT*  Clinical Data: Shortness of breath.  PORTABLE CHEST - 1 VIEW  Comparison: 09/26/2011.  Findings: The heart is mildly enlarged but stable.  There is persistent peribronchial thickening, increased interstitial markings and patchy right upper lobe infiltrate.  Both lung bases are slightly better aerated with resolving effusions and atelectasis.  IMPRESSION: 1.  Improved basilar aeration. 2.  Persistent right upper lobe infiltrate. 3.  Stable bronchitic changes.  Original Report Authenticated By: P. Loralie Champagne, M.D.   Dg Chest Portable 1 View  09/26/2011  *RADIOLOGY REPORT*  Clinical Data: Shortness of breath.  PORTABLE  CHEST - 1 VIEW  Comparison: 09/19/2011  Findings: Shallow inspiration.  Small bilateral pleural effusions with basilar atelectasis.  Focal consolidation in the right upper lung with volume loss.  Cardiac enlargement with normal pulmonary vascularity.  Stable appearance since previous study.  No pneumothorax.  IMPRESSION: Bilateral pleural effusions with basilar atelectasis. Consolidation in the right upper lung.  Cardiac enlargement. Changes are similar to previous study.  Original Report Authenticated By: Marlon Pel, M.D.   Dg Chest Port 1 View  09/11/2011  *RADIOLOGY REPORT*  Clinical Data: Dyspnea.  PORTABLE CHEST - 1 VIEW  Comparison: Chest radiograph performed earlier today at 06:31 a.m.  Findings: The lungs are well-aerated.  There has been interval  redistribution of bilateral airspace opacities.  Given the appearance on the prior study, this remains concerning for pneumonia, though interstitial edema could have a similar appearance.  Underlying vascular congestion is noted.  A small left pleural effusion is suspected.  No pneumothorax is seen.  The cardiomediastinal silhouette remains borderline normal in size. No acute osseous abnormalities are identified.  IMPRESSION: Persistent bilateral airspace opacities; this remains concerning for pneumonia, though interstitial edema could have a similar appearance.  Underlying vascular congestion and small left pleural effusion noted.  Original Report Authenticated By: Tonia Ghent, M.D.   Dg Chest Port 1 View  09/10/2011  *RADIOLOGY REPORT*  Clinical Data: Short of breath  PORTABLE CHEST - 1 VIEW  Comparison: Chest CT 02/16/2011  Findings: Cardiac silhouette is enlarged.  There is air space disease in the right upper lobe.  There is bibasilar atelectasis. No pneumothorax. No acute osseous abnormality.  IMPRESSION: Right upper lobe pneumonia.  Recommend follow-up chest x-rays after therapy to ensure resolution.  Original Report Authenticated By: Genevive Bi, M.D.    Lab Results: Basic Metabolic Panel:  Basename 10/02/11 0319 10/01/11 0529  NA 138 137  K 3.9 3.6  CL 101 100  CO2 28 26  GLUCOSE 86 90  BUN 25* 37*  CREATININE 1.55* 1.74*  CALCIUM 8.8 8.7  MG -- --  PHOS -- --   Liver Function Tests:  [facility] 09/30/11 0443  AST 163*  ALT 200*  ALKPHOS 76  BILITOT 0.8  PROT 6.9  ALBUMIN 3.3*     CBC:  Basename 10/02/11 0319 10/01/11 0529  WBC 9.1 10.2  NEUTROABS -- --  HGB 12.3* 13.5  HCT 38.3* 41.0  MCV 94.8 93.8  PLT 227 252       Hospital Course: This 56 year old man presented to the hospital with symptoms of progressive shortness of breath for one week. He had recently been discharged from [facility], having spent 8 days for respiratory failure associated with  COPD and healthcare associated pneumonia as well as non-ST elevation MI. He also apparently had acute renal failure with ATN. He was discharged home and then approximately a week later he developed the symptoms that led to this admission. Chest x-ray was suggestive of pneumonia as well as symptoms. Chest x-ray showed persistent right upper lobe infiltrate. He was treated with intravenous antibiotics and he has done well. He is switched to oral antibiotics and he feels well on this without any fevers. He was given gentle IV fluid hydration and his creatinine is improving. He is taking oral fluids well. He denies any particular dyspnea at the present time.  Discharge Exam: Blood pressure 108/67, pulse 103, temperature 98.2 F (36.8 C), temperature source Oral, resp. rate 18, height 5\' 8"  (1.727 m), weight 86.773 kg (191 lb 4.8  oz), SpO2 93.00%. He looks systemically well. There is no increased work of breathing. There is no peripheral or central cyanosis. Lung fields extra clinically clear with no evidence of crackles, wheezing or bronchial breathing. Heart sounds are present and normal with a systolic murmur is typical of mitral regurgitation. Jugular venous pressure not elevated. He does not appear to be clinically in heart failure. He is alert and orientated without any focal neurological signs.  Disposition: Home. He'll need a further 7 day course of oral Avelox. He also has an appointment with his cardiologist Dr. Donnie Aho this week and he will keep this appointment. His liver enzymes were noted to be elevated during this hospitalization, this needs to be followed. Clinically he does not appear to have any evidence of chronic liver disease.  Discharge Orders    Future Orders Please Complete By Expires   Diet - low sodium heart healthy      Increase activity slowly         Follow-up Information    Follow up with Colette Ribas, MD. Schedule an appointment as soon as possible for a visit in 2  weeks.   Contact information:   [address] [place] [address] [place] [address] [phone]          SignedWilson Singer Pager [phone]  10/02/2011, 9:43 AM

## 2011-10-09 ENCOUNTER — Encounter (HOSPITAL_COMMUNITY): Payer: Self-pay | Admitting: *Deleted

## 2011-10-09 ENCOUNTER — Inpatient Hospital Stay (HOSPITAL_COMMUNITY)
Admission: EM | Admit: 2011-10-09 | Discharge: 2011-10-25 | DRG: 216 | Disposition: A | Discharge status: Home / Self Care | Payer: Medicaid Other | Attending: Surgery | Admitting: Surgery

## 2011-10-09 ENCOUNTER — Emergency Department (HOSPITAL_COMMUNITY): Payer: Medicaid Other

## 2011-10-09 DIAGNOSIS — R7309 Other abnormal glucose: Secondary | ICD-10-CM | POA: Diagnosis present

## 2011-10-09 DIAGNOSIS — E871 Hypo-osmolality and hyponatremia: Secondary | ICD-10-CM | POA: Diagnosis not present

## 2011-10-09 DIAGNOSIS — I509 Heart failure, unspecified: Secondary | ICD-10-CM

## 2011-10-09 DIAGNOSIS — Z8673 Personal history of transient ischemic attack (TIA), and cerebral infarction without residual deficits: Secondary | ICD-10-CM

## 2011-10-09 DIAGNOSIS — Z9889 Other specified postprocedural states: Secondary | ICD-10-CM

## 2011-10-09 DIAGNOSIS — K5900 Constipation, unspecified: Secondary | ICD-10-CM | POA: Diagnosis not present

## 2011-10-09 DIAGNOSIS — D62 Acute posthemorrhagic anemia: Secondary | ICD-10-CM | POA: Diagnosis not present

## 2011-10-09 DIAGNOSIS — I498 Other specified cardiac arrhythmias: Secondary | ICD-10-CM | POA: Diagnosis not present

## 2011-10-09 DIAGNOSIS — Z7982 Long term (current) use of aspirin: Secondary | ICD-10-CM

## 2011-10-09 DIAGNOSIS — R9431 Abnormal electrocardiogram [ECG] [EKG]: Secondary | ICD-10-CM

## 2011-10-09 DIAGNOSIS — I959 Hypotension, unspecified: Secondary | ICD-10-CM | POA: Diagnosis present

## 2011-10-09 DIAGNOSIS — N183 Chronic kidney disease, stage 3 unspecified: Secondary | ICD-10-CM | POA: Diagnosis present

## 2011-10-09 DIAGNOSIS — J9610 Chronic respiratory failure, unspecified whether with hypoxia or hypercapnia: Secondary | ICD-10-CM | POA: Diagnosis present

## 2011-10-09 DIAGNOSIS — F1011 Alcohol abuse, in remission: Secondary | ICD-10-CM | POA: Diagnosis present

## 2011-10-09 DIAGNOSIS — M549 Dorsalgia, unspecified: Secondary | ICD-10-CM | POA: Diagnosis present

## 2011-10-09 DIAGNOSIS — J4489 Other specified chronic obstructive pulmonary disease: Secondary | ICD-10-CM | POA: Diagnosis present

## 2011-10-09 DIAGNOSIS — Z87891 Personal history of nicotine dependence: Secondary | ICD-10-CM

## 2011-10-09 DIAGNOSIS — Z951 Presence of aortocoronary bypass graft: Secondary | ICD-10-CM

## 2011-10-09 DIAGNOSIS — I2510 Atherosclerotic heart disease of native coronary artery without angina pectoris: Secondary | ICD-10-CM | POA: Diagnosis present

## 2011-10-09 DIAGNOSIS — N179 Acute kidney failure, unspecified: Secondary | ICD-10-CM | POA: Diagnosis present

## 2011-10-09 DIAGNOSIS — Z952 Presence of prosthetic heart valve: Secondary | ICD-10-CM

## 2011-10-09 DIAGNOSIS — I5020 Unspecified systolic (congestive) heart failure: Secondary | ICD-10-CM | POA: Diagnosis present

## 2011-10-09 DIAGNOSIS — I059 Rheumatic mitral valve disease, unspecified: Secondary | ICD-10-CM

## 2011-10-09 DIAGNOSIS — I2589 Other forms of chronic ischemic heart disease: Secondary | ICD-10-CM | POA: Diagnosis present

## 2011-10-09 DIAGNOSIS — Y95 Nosocomial condition: Secondary | ICD-10-CM

## 2011-10-09 DIAGNOSIS — Z7902 Long term (current) use of antithrombotics/antiplatelets: Secondary | ICD-10-CM | POA: Diagnosis present

## 2011-10-09 DIAGNOSIS — Z79899 Other long term (current) drug therapy: Secondary | ICD-10-CM

## 2011-10-09 DIAGNOSIS — K761 Chronic passive congestion of liver: Secondary | ICD-10-CM | POA: Diagnosis present

## 2011-10-09 DIAGNOSIS — R7989 Other specified abnormal findings of blood chemistry: Secondary | ICD-10-CM | POA: Diagnosis present

## 2011-10-09 DIAGNOSIS — I129 Hypertensive chronic kidney disease with stage 1 through stage 4 chronic kidney disease, or unspecified chronic kidney disease: Secondary | ICD-10-CM | POA: Diagnosis present

## 2011-10-09 DIAGNOSIS — J9620 Acute and chronic respiratory failure, unspecified whether with hypoxia or hypercapnia: Secondary | ICD-10-CM | POA: Diagnosis present

## 2011-10-09 DIAGNOSIS — E785 Hyperlipidemia, unspecified: Secondary | ICD-10-CM | POA: Diagnosis present

## 2011-10-09 DIAGNOSIS — G8929 Other chronic pain: Secondary | ICD-10-CM | POA: Diagnosis present

## 2011-10-09 DIAGNOSIS — I214 Non-ST elevation (NSTEMI) myocardial infarction: Secondary | ICD-10-CM

## 2011-10-09 DIAGNOSIS — I340 Nonrheumatic mitral (valve) insufficiency: Secondary | ICD-10-CM | POA: Diagnosis present

## 2011-10-09 DIAGNOSIS — D72829 Elevated white blood cell count, unspecified: Secondary | ICD-10-CM | POA: Diagnosis present

## 2011-10-09 DIAGNOSIS — D696 Thrombocytopenia, unspecified: Secondary | ICD-10-CM | POA: Diagnosis not present

## 2011-10-09 DIAGNOSIS — J189 Pneumonia, unspecified organism: Secondary | ICD-10-CM | POA: Diagnosis present

## 2011-10-09 DIAGNOSIS — I5023 Acute on chronic systolic (congestive) heart failure: Principal | ICD-10-CM

## 2011-10-09 DIAGNOSIS — J9600 Acute respiratory failure, unspecified whether with hypoxia or hypercapnia: Secondary | ICD-10-CM | POA: Diagnosis present

## 2011-10-09 DIAGNOSIS — J449 Chronic obstructive pulmonary disease, unspecified: Secondary | ICD-10-CM | POA: Diagnosis present

## 2011-10-09 DIAGNOSIS — R739 Hyperglycemia, unspecified: Secondary | ICD-10-CM | POA: Diagnosis present

## 2011-10-09 HISTORY — DX: Heart failure, unspecified: I50.9

## 2011-10-09 HISTORY — DX: Chronic obstructive pulmonary disease, unspecified: J44.9

## 2011-10-09 HISTORY — DX: Atherosclerotic heart disease of native coronary artery without angina pectoris: I25.10

## 2011-10-09 HISTORY — DX: Pneumonia, unspecified organism: J18.9

## 2011-10-09 LAB — COMPREHENSIVE METABOLIC PANEL
AST: 58 U/L — ABNORMAL HIGH (ref 0–37)
Albumin: 3.9 g/dL (ref 3.5–5.2)
Alkaline Phosphatase: 111 U/L (ref 39–117)
BUN: 30 mg/dL — ABNORMAL HIGH (ref 6–23)
CO2: 26 mEq/L (ref 19–32)
Chloride: 97 mEq/L (ref 96–112)
Creatinine, Ser: 1.49 mg/dL — ABNORMAL HIGH (ref 0.50–1.35)
GFR calc non Af Amer: 51 mL/min — ABNORMAL LOW (ref >90)
Potassium: 4.8 mEq/L (ref 3.5–5.1)
Total Bilirubin: 1 mg/dL (ref 0.3–1.2)

## 2011-10-09 LAB — CBC
HCT: 40.5 % (ref 39.0–52.0)
Hemoglobin: 13.3 g/dL (ref 13.0–17.0)
MCHC: 32.8 g/dL (ref 30.0–36.0)
WBC: 13.2 10*3/uL — ABNORMAL HIGH (ref 4.0–10.5)

## 2011-10-09 LAB — URINALYSIS, ROUTINE W REFLEX MICROSCOPIC
Glucose, UA: NEGATIVE mg/dL
Ketones, ur: NEGATIVE mg/dL
Leukocytes, UA: NEGATIVE
Nitrite: NEGATIVE
Specific Gravity, Urine: >1.03 — ABNORMAL HIGH (ref 1.005–1.030)
pH: 5.5 (ref 5.0–8.0)

## 2011-10-09 LAB — DIFFERENTIAL
Basophils Absolute: 0.1 10*3/uL (ref 0.0–0.1)
Basophils Relative: 1 % (ref 0–1)
Lymphocytes Relative: 33 % (ref 12–46)
Monocytes Absolute: 1.4 10*3/uL — ABNORMAL HIGH (ref 0.1–1.0)
Monocytes Relative: 11 % (ref 3–12)
Neutro Abs: 7.3 10*3/uL (ref 1.7–7.7)
Neutrophils Relative %: 54 % (ref 43–77)

## 2011-10-09 LAB — URINE MICROSCOPIC-ADD ON

## 2011-10-09 LAB — BLOOD GAS, ARTERIAL
Acid-base deficit: 1.1 mmol/L (ref 0.0–2.0)
Bicarbonate: 22.2 mEq/L (ref 20.0–24.0)
TCO2: 19.5 mmol/L (ref 0–100)
pCO2 arterial: 31.1 mmHg — ABNORMAL LOW (ref 35.0–45.0)
pH, Arterial: 7.467 — ABNORMAL HIGH (ref 7.350–7.450)

## 2011-10-09 LAB — POCT I-STAT TROPONIN I

## 2011-10-09 LAB — PRO B NATRIURETIC PEPTIDE: Pro B Natriuretic peptide (BNP): 14487 pg/mL — ABNORMAL HIGH (ref 0–125)

## 2011-10-09 MED ORDER — ENOXAPARIN SODIUM 60 MG/0.6ML ~~LOC~~ SOLN: 90.0000 mg | Freq: Once | SUBCUTANEOUS | Status: DC

## 2011-10-09 MED ORDER — METHYLPREDNISOLONE SODIUM SUCC 125 MG IJ SOLR
125.0000 mg | Freq: Once | INTRAMUSCULAR | Status: AC
  Administered 2011-10-09: 125 mg via INTRAVENOUS
  Filled 2011-10-09: qty 2

## 2011-10-09 MED ORDER — ALBUTEROL SULFATE (5 MG/ML) 0.5% IN NEBU
5.0000 mg | INHALATION_SOLUTION | Freq: Once | RESPIRATORY_TRACT | Status: AC
  Administered 2011-10-09: 5 mg via RESPIRATORY_TRACT
  Filled 2011-10-09: qty 1

## 2011-10-09 MED ORDER — FUROSEMIDE 10 MG/ML IJ SOLN
60.0000 mg | Freq: Once | INTRAMUSCULAR | Status: AC
  Administered 2011-10-09: 60 mg via INTRAVENOUS
  Filled 2011-10-09: qty 6

## 2011-10-09 NOTE — ED Notes (Signed)
Increased sob , On 3 L o2.  No chest pain

## 2011-10-09 NOTE — ED Provider Notes (Signed)
History    This chart was scribed for Hilario Quarry, MD, MD by Smitty Pluck. The patient was seen in room APA10 and the patient's care was started at 8:05PM.   CSN: [id number]  Arrival date & time 10/09/11  9147   First MD Initiated Contact with Patient 10/09/11 2001      Chief Complaint  Patient presents with  . Shortness of Breath    (Consider location/radiation/quality/duration/timing/severity/associated sxs/prior treatment) Patient is a 56 y.o. male presenting with shortness of breath. The history is provided by the patient.  Shortness of Breath  Associated symptoms include shortness of breath.   GREGERY WALBERG is a 56 y.o. male who presents to the Emergency Department complaining of moderate SOB onset 8 hours ago today. Symptoms have been constant since onset without radiation. Pt recently had pneumonia and was in hospital [facility]) 09/29/10 and was released. Pt reports that he has cough. He is on oxygen at home. Ever since getting out of hospital he has had difficulty breathing. He reports having COPD. Pt was on blood thinners in the hospital. He had MI when he had pneumonia recently. Denies hx of blood clots in lungs. Pt just finished abx that he was given in hospital. Pt is allergic to Lopressor, morphine and peaches.   PCP is Dr. Phillips Odor  Past Medical History  Diagnosis Date  . Chronic pain     back pain  . Stroke   . Hyperlipemia   . Lumbar disc disease   . Carotid artery disease     Past Surgical History  Procedure Date  . Back surgery [id number]  . Knee arthroscopy 2002    rt  . Foreign body removal 06/05/2011    Procedure: FOREIGN BODY REMOVAL ADULT;  Surgeon: Rosalio Macadamia;  Location: [facility];  Service: Plastics;  Laterality: Right;  removal foreign body from right side face   . Carotid endarterectomy     Family History  Problem Relation Age of Onset  . Coronary artery disease Mother   . Coronary artery disease Father     History   Substance Use Topics  . Smoking status: Former Smoker -- 2.0 packs/day for 30 years    Types: Cigarettes  . Smokeless tobacco: Not on file  . Alcohol Use: No      Review of Systems  Respiratory: Positive for shortness of breath.   All other systems reviewed and are negative.   10 Systems reviewed and all are negative for acute change except as noted in the HPI.   Allergies  Morphine and related and Other  Home Medications   Current Outpatient Rx  Name Route Sig Dispense Refill  . ALBUTEROL SULFATE HFA 108 (90 BASE) MCG/ACT IN AERS Inhalation Inhale 1-2 puffs into the lungs every 6 (six) hours as needed for wheezing. 1 Inhaler 0  . ALPRAZOLAM 0.25 MG PO TABS Oral Take 1 tablet (0.25 mg total) by mouth 3 (three) times daily as needed for anxiety. 30 tablet 0  . ASPIRIN 81 MG PO TBEC Oral Take 1 tablet (81 mg total) by mouth daily. 30 tablet 0  . CARVEDILOL 3.125 MG PO TABS Oral Take 1 tablet (3.125 mg total) by mouth 2 (two) times daily with a meal. 60 tablet 0  . CLOPIDOGREL BISULFATE 75 MG PO TABS Oral Take 1 tablet (75 mg total) by mouth daily with breakfast. 30 tablet 0  . CYCLOBENZAPRINE HCL 10 MG PO TABS Oral Take 10 mg by mouth 3 (  three) times daily as needed.      . GUAIFENESIN ER 600 MG PO TB12 Oral Take 2 tablets (1,200 mg total) by mouth 2 (two) times daily. 30 tablet 0  . HYDROCODONE-ACETAMINOPHEN 10-325 MG PO TABS Oral Take 1 tablet by mouth every 8 (eight) hours as needed. Pain    . MOXIFLOXACIN HCL 400 MG PO TABS Oral Take 1 tablet (400 mg total) by mouth daily at 6 PM. 7 tablet 0  . NICOTINE 14 MG/24HR TD PT24 Transdermal Place 1 patch onto the skin daily.      BP 131/77  Temp(Src) 97.3 F (36.3 C) (Oral)  Resp 44  Ht 5\' 8"  (1.727 m)  Wt 190 lb (86.183 kg)  BMI 28.89 kg/m2  SpO2 94%  Physical Exam  Nursing note and vitals reviewed. Constitutional: He is oriented to person, place, and time. He appears well-developed and well-nourished. No distress.    HENT:  Head: Normocephalic and atraumatic.  Eyes: Conjunctivae are normal. Pupils are equal, round, and reactive to light.  Neck: Normal range of motion. No thyromegaly present.  Cardiovascular: Normal rate, regular rhythm and normal heart sounds.   Pulmonary/Chest: No respiratory distress. He has decreased breath sounds in the right middle field.       Rhonchi throughout   Abdominal: Soft. He exhibits no distension.  Neurological: He is alert and oriented to person, place, and time.  Skin: Skin is warm and dry.  Psychiatric: He has a normal mood and affect. His behavior is normal.    ED Course  Procedures (including critical care time) DIAGNOSTIC STUDIES: Oxygen Saturation is 94% on [place], adequate by my interpretation.    COORDINATION OF CARE: 8:11PM EDP discusses pt ED treatment with pt  8:15PM EDP ordered medication: proventil 0.5% nebulizer, solu-medrol 125 mg/ 2 ml    Labs Reviewed  CBC - Abnormal; Notable for the following:    WBC 13.2 (*)    All other components within normal limits  DIFFERENTIAL - Abnormal; Notable for the following:    Lymphs Abs 4.3 (*)    Monocytes Absolute 1.4 (*)    All other components within normal limits  COMPREHENSIVE METABOLIC PANEL - Abnormal; Notable for the following:    Glucose, Bld 119 (*)    BUN 30 (*)    Creatinine, Ser 1.49 (*)    AST 58 (*)    ALT 56 (*)    GFR calc non Af Amer 51 (*)    GFR calc Af Amer 59 (*)    All other components within normal limits  D-DIMER, QUANTITATIVE - Abnormal; Notable for the following:    D-Dimer, Quant 2.42 (*)    All other components within normal limits  PRO B NATRIURETIC PEPTIDE - Abnormal; Notable for the following:    Pro B Natriuretic peptide (BNP) 14487.0 (*)    All other components within normal limits  BLOOD GAS, ARTERIAL - Abnormal; Notable for the following:    pH, Arterial 7.467 (*)    pCO2 arterial 31.1 (*)    All other components within normal limits  URINALYSIS, ROUTINE W  REFLEX MICROSCOPIC  URINE CULTURE   Dg Chest 2 View  10/09/2011  *RADIOLOGY REPORT*  Clinical Data: Shortness of breath.  Chronic pain.  History of MI. History of stroke, hyperlipidemia, carotid artery disease.  CHEST - 2 VIEW  Comparison: 09/28/2011  Findings: The heart is enlarged.  There are patchy airspace filling opacities bilaterally, right greater than left.  Bilateral pleural effusions are present,  increased on the right since prior study.  IMPRESSION:  1.  Bilateral infiltrates. 2.  Increased right pleural effusion.  Original Report Authenticated By: Patterson Hammersmith, M.D.     No diagnosis found.  Date: 10/09/2011  Rate: 111  Rhythm: sinus tachycardia  QRS Axis: normal  Intervals: normal  ST/T Wave abnormalities: nonspecific T wave changes  Conduction Disutrbances:none  Narrative Interpretation:   Old EKG Reviewed: unchanged   MDM  [name] care discussed with Dr. Ephriam Knuckles on. He'll be admitted to a step down unit bed. Patient with bilateral infiltrates on chest x-Benji Poynter and increased white blood cell count. He'll be treated with Zosyn and Vanco for hospital associated pneumonia. He also has an elevated BNP with diffuse increased markings and bilateral effusions. He will be given Lasix.  D-dimer is noted to be elevated. His creatinine is also elevated and therefore he cannot have a CT angiogram. He'll be given Lovenox here in the emergency department.    Hilario Quarry, MD 10/09/11 [phone]

## 2011-10-10 ENCOUNTER — Inpatient Hospital Stay (HOSPITAL_COMMUNITY): Payer: Medicaid Other

## 2011-10-10 ENCOUNTER — Encounter (HOSPITAL_COMMUNITY): Payer: Self-pay | Admitting: Emergency Medicine

## 2011-10-10 DIAGNOSIS — I509 Heart failure, unspecified: Secondary | ICD-10-CM

## 2011-10-10 DIAGNOSIS — J9600 Acute respiratory failure, unspecified whether with hypoxia or hypercapnia: Secondary | ICD-10-CM | POA: Diagnosis present

## 2011-10-10 DIAGNOSIS — R9431 Abnormal electrocardiogram [ECG] [EKG]: Secondary | ICD-10-CM

## 2011-10-10 DIAGNOSIS — I214 Non-ST elevation (NSTEMI) myocardial infarction: Secondary | ICD-10-CM

## 2011-10-10 LAB — CBC
Hemoglobin: 12.2 g/dL — ABNORMAL LOW (ref 13.0–17.0)
MCH: 30 pg (ref 26.0–34.0)
MCV: 93.1 fL (ref 78.0–100.0)
RBC: 4.06 MIL/uL — ABNORMAL LOW (ref 4.22–5.81)

## 2011-10-10 LAB — CARDIAC PANEL(CRET KIN+CKTOT+MB+TROPI)
CK, MB: 4.3 ng/mL — ABNORMAL HIGH (ref 0.3–4.0)
CK, MB: 4 ng/mL (ref 0.3–4.0)
Relative Index: INVALID (ref 0.0–2.5)
Total CK: 62 U/L (ref 7–232)
Total CK: 55 U/L (ref 7–232)
Total CK: 58 U/L (ref 7–232)

## 2011-10-10 LAB — PROTIME-INR: Prothrombin Time: 18.2 seconds — ABNORMAL HIGH (ref 11.6–15.2)

## 2011-10-10 LAB — COMPREHENSIVE METABOLIC PANEL
AST: 47 U/L — ABNORMAL HIGH (ref 0–37)
Albumin: 3.5 g/dL (ref 3.5–5.2)
Calcium: 9.2 mg/dL (ref 8.4–10.5)
Creatinine, Ser: 1.33 mg/dL (ref 0.50–1.35)
Total Protein: 7.4 g/dL (ref 6.0–8.3)

## 2011-10-10 LAB — TSH: TSH: 2.646 u[IU]/mL (ref 0.350–4.500)

## 2011-10-10 MED ORDER — CARVEDILOL 3.125 MG PO TABS
6.2500 mg | ORAL_TABLET | Freq: Two times a day (BID) | ORAL | Status: DC
  Administered 2011-10-10 – 2011-10-11 (×2): 6.25 mg via ORAL
  Filled 2011-10-10 (×2): qty 2

## 2011-10-10 MED ORDER — DEXTROSE 5 % IV SOLN
INTRAVENOUS | Status: AC
  Filled 2011-10-10: qty 1

## 2011-10-10 MED ORDER — DEXTROSE 5 % IV SOLN
1.0000 g | Freq: Once | INTRAVENOUS | Status: AC
  Administered 2011-10-10: 1 g via INTRAVENOUS
  Filled 2011-10-10: qty 1

## 2011-10-10 MED ORDER — CLOPIDOGREL BISULFATE 75 MG PO TABS
75.0000 mg | ORAL_TABLET | Freq: Every day | ORAL | Status: DC
  Administered 2011-10-10 – 2011-10-14 (×5): 75 mg via ORAL
  Filled 2011-10-10 (×6): qty 1

## 2011-10-10 MED ORDER — ONDANSETRON HCL 4 MG/2ML IJ SOLN
4.0000 mg | Freq: Once | INTRAMUSCULAR | Status: AC
  Administered 2011-10-10: 4 mg via INTRAVENOUS
  Filled 2011-10-10: qty 2

## 2011-10-10 MED ORDER — SODIUM CHLORIDE 0.9 % IJ SOLN
3.0000 mL | Freq: Two times a day (BID) | INTRAMUSCULAR | Status: DC
  Administered 2011-10-10 – 2011-10-15 (×12): 3 mL via INTRAVENOUS
  Filled 2011-10-10 (×4): qty 3

## 2011-10-10 MED ORDER — DEXTROSE 5 % IV SOLN
1.0000 g | Freq: Two times a day (BID) | INTRAVENOUS | Status: DC
  Administered 2011-10-10 – 2011-10-17 (×14): 1 g via INTRAVENOUS
  Filled 2011-10-10 (×25): qty 1

## 2011-10-10 MED ORDER — CARVEDILOL 3.125 MG PO TABS
3.1250 mg | ORAL_TABLET | Freq: Two times a day (BID) | ORAL | Status: DC
  Administered 2011-10-10: 3.125 mg via ORAL
  Filled 2011-10-10: qty 1

## 2011-10-10 MED ORDER — FUROSEMIDE 10 MG/ML IJ SOLN
60.0000 mg | Freq: Three times a day (TID) | INTRAMUSCULAR | Status: DC
  Administered 2011-10-10: 60 mg via INTRAVENOUS
  Filled 2011-10-10: qty 6

## 2011-10-10 MED ORDER — MOXIFLOXACIN HCL IN NACL 400 MG/250ML IV SOLN
INTRAVENOUS | Status: AC
  Filled 2011-10-10: qty 250

## 2011-10-10 MED ORDER — MOXIFLOXACIN HCL IN NACL 400 MG/250ML IV SOLN
400.0000 mg | Freq: Every day | INTRAVENOUS | Status: DC
  Administered 2011-10-10 – 2011-10-12 (×4): 400 mg via INTRAVENOUS
  Filled 2011-10-10 (×7): qty 250

## 2011-10-10 MED ORDER — ASPIRIN EC 81 MG PO TBEC
81.0000 mg | DELAYED_RELEASE_TABLET | Freq: Every day | ORAL | Status: DC
  Administered 2011-10-10 – 2011-10-15 (×6): 81 mg via ORAL
  Filled 2011-10-10 (×6): qty 1

## 2011-10-10 MED ORDER — SODIUM CHLORIDE 0.9 % IJ SOLN
3.0000 mL | Freq: Two times a day (BID) | INTRAMUSCULAR | Status: DC
  Administered 2011-10-10 – 2011-10-18 (×7): 3 mL via INTRAVENOUS
  Administered 2011-10-18: 09:00:00 via INTRAVENOUS
  Administered 2011-10-18 – 2011-10-19 (×2): 3 mL via INTRAVENOUS
  Filled 2011-10-10 (×6): qty 3

## 2011-10-10 MED ORDER — VANCOMYCIN HCL IN DEXTROSE 1-5 GM/200ML-% IV SOLN
1000.0000 mg | Freq: Two times a day (BID) | INTRAVENOUS | Status: DC
  Administered 2011-10-10 – 2011-10-12 (×3): 1000 mg via INTRAVENOUS
  Filled 2011-10-10 (×4): qty 200

## 2011-10-10 MED ORDER — SPIRONOLACTONE 25 MG PO TABS
12.5000 mg | ORAL_TABLET | Freq: Every day | ORAL | Status: DC
  Administered 2011-10-10 – 2011-10-11 (×2): 12.5 mg via ORAL
  Filled 2011-10-10 (×2): qty 1

## 2011-10-10 MED ORDER — ACETAMINOPHEN 650 MG RE SUPP: 650.0000 mg | Freq: Four times a day (QID) | RECTAL | Status: DC | PRN

## 2011-10-10 MED ORDER — TECHNETIUM TO 99M ALBUMIN AGGREGATED
3.0000 | Freq: Once | INTRAVENOUS | Status: AC | PRN
  Administered 2011-10-10: 2.8 via INTRAVENOUS

## 2011-10-10 MED ORDER — ENOXAPARIN SODIUM 40 MG/0.4ML ~~LOC~~ SOLN
40.0000 mg | SUBCUTANEOUS | Status: DC
  Administered 2011-10-11 – 2011-10-19 (×7): 40 mg via SUBCUTANEOUS
  Filled 2011-10-10 (×9): qty 0.4

## 2011-10-10 MED ORDER — LEVALBUTEROL HCL 0.63 MG/3ML IN NEBU
0.6300 mg | INHALATION_SOLUTION | RESPIRATORY_TRACT | Status: DC | PRN
  Filled 2011-10-10: qty 3

## 2011-10-10 MED ORDER — CYCLOBENZAPRINE HCL 10 MG PO TABS
10.0000 mg | ORAL_TABLET | Freq: Three times a day (TID) | ORAL | Status: DC | PRN
  Administered 2011-10-10 – 2011-10-19 (×2): 10 mg via ORAL
  Filled 2011-10-10 (×3): qty 1

## 2011-10-10 MED ORDER — XENON XE 133 GAS
10.0000 | GAS_FOR_INHALATION | Freq: Once | RESPIRATORY_TRACT | Status: AC | PRN
  Administered 2011-10-10: 21 via RESPIRATORY_TRACT

## 2011-10-10 MED ORDER — VANCOMYCIN HCL IN DEXTROSE 1-5 GM/200ML-% IV SOLN
1000.0000 mg | Freq: Once | INTRAVENOUS | Status: AC
  Administered 2011-10-10: 1000 mg via INTRAVENOUS
  Filled 2011-10-10: qty 200

## 2011-10-10 MED ORDER — ONDANSETRON HCL 4 MG PO TABS
4.0000 mg | ORAL_TABLET | Freq: Four times a day (QID) | ORAL | Status: DC | PRN
  Administered 2011-10-10 – 2011-10-11 (×2): 4 mg via ORAL
  Filled 2011-10-10 (×2): qty 1

## 2011-10-10 MED ORDER — CLOPIDOGREL BISULFATE 75 MG PO TABS: 75.0000 mg | ORAL_TABLET | Freq: Every day | ORAL | Status: DC

## 2011-10-10 MED ORDER — ONDANSETRON HCL 4 MG/2ML IJ SOLN: 4.0000 mg | Freq: Four times a day (QID) | INTRAMUSCULAR | Status: DC | PRN

## 2011-10-10 MED ORDER — FUROSEMIDE 10 MG/ML IJ SOLN
60.0000 mg | Freq: Two times a day (BID) | INTRAMUSCULAR | Status: DC
  Administered 2011-10-10 (×2): 60 mg via INTRAVENOUS
  Filled 2011-10-10 (×2): qty 6

## 2011-10-10 MED ORDER — NICOTINE 14 MG/24HR TD PT24
14.0000 mg | MEDICATED_PATCH | Freq: Every day | TRANSDERMAL | Status: DC
  Administered 2011-10-10 – 2011-10-19 (×10): 14 mg via TRANSDERMAL
  Filled 2011-10-10 (×12): qty 1

## 2011-10-10 MED ORDER — LEVALBUTEROL HCL 0.63 MG/3ML IN NEBU
0.6300 mg | INHALATION_SOLUTION | Freq: Four times a day (QID) | RESPIRATORY_TRACT | Status: DC
  Administered 2011-10-10 – 2011-10-13 (×13): 0.63 mg via RESPIRATORY_TRACT
  Filled 2011-10-10 (×19): qty 3

## 2011-10-10 MED ORDER — ACETAMINOPHEN 325 MG PO TABS: 650.0000 mg | ORAL_TABLET | Freq: Four times a day (QID) | ORAL | Status: DC | PRN

## 2011-10-10 MED ORDER — LISINOPRIL 5 MG PO TABS
2.5000 mg | ORAL_TABLET | Freq: Two times a day (BID) | ORAL | Status: DC
  Administered 2011-10-10 – 2011-10-11 (×2): 2.5 mg via ORAL
  Filled 2011-10-10 (×3): qty 1

## 2011-10-10 MED ORDER — ENOXAPARIN SODIUM 30 MG/0.3ML ~~LOC~~ SOLN
1.0000 mg/kg | Freq: Two times a day (BID) | SUBCUTANEOUS | Status: DC
  Administered 2011-10-10 (×2): 90 mg via SUBCUTANEOUS
  Filled 2011-10-10 (×2): qty 0.3

## 2011-10-10 MED ORDER — DOCUSATE SODIUM 100 MG PO CAPS
100.0000 mg | ORAL_CAPSULE | Freq: Two times a day (BID) | ORAL | Status: DC
  Administered 2011-10-10 – 2011-10-19 (×19): 100 mg via ORAL
  Filled 2011-10-10 (×25): qty 1

## 2011-10-10 MED ORDER — VANCOMYCIN HCL IN DEXTROSE 1-5 GM/200ML-% IV SOLN
INTRAVENOUS | Status: AC
  Filled 2011-10-10: qty 200

## 2011-10-10 MED ORDER — PNEUMOCOCCAL VAC POLYVALENT 25 MCG/0.5ML IJ INJ
0.5000 mL | INJECTION | INTRAMUSCULAR | Status: DC
  Filled 2011-10-10: qty 0.5

## 2011-10-10 MED ORDER — PNEUMOCOCCAL VAC POLYVALENT 25 MCG/0.5ML IJ INJ
0.5000 mL | INJECTION | Freq: Once | INTRAMUSCULAR | Status: AC
  Administered 2011-10-10: 0.5 mL via INTRAMUSCULAR
  Filled 2011-10-10: qty 0.5

## 2011-10-10 MED ORDER — ALPRAZOLAM 0.25 MG PO TABS
0.2500 mg | ORAL_TABLET | Freq: Three times a day (TID) | ORAL | Status: DC | PRN
  Administered 2011-10-10 – 2011-10-18 (×6): 0.25 mg via ORAL
  Filled 2011-10-10 (×6): qty 1

## 2011-10-10 MED ORDER — HYDROCODONE-ACETAMINOPHEN 5-325 MG PO TABS
1.0000 | ORAL_TABLET | ORAL | Status: DC | PRN
  Administered 2011-10-10 – 2011-10-19 (×4): 2 via ORAL
  Filled 2011-10-10 (×4): qty 2

## 2011-10-10 MED ORDER — BIOTENE DRY MOUTH MT LIQD
15.0000 mL | Freq: Two times a day (BID) | OROMUCOSAL | Status: DC
  Administered 2011-10-10 – 2011-10-19 (×19): 15 mL via OROMUCOSAL

## 2011-10-10 NOTE — Consult Note (Signed)
ANTIBIOTIC CONSULT NOTE - INITIAL  Pharmacy Consult for Vancomycin and Zosyn Indication: rule out pneumonia  Allergies  Allergen Reactions  . Metoprolol Shortness Of Breath and Nausea And Vomiting  . Morphine And Related Hives and Itching  . Other Other (See Comments)    Peaches cause hives and itching   . Peach Flavor Hives and Itching   Patient Measurements: Height: 5\' 8"  (172.7 cm) Weight: 188 lb 11.4 oz (85.6 kg) IBW/kg (Calculated) : 68.4   Vital Signs: Temp: 97.6 F (36.4 C) (04/16 0400) Temp src: Oral (04/16 0400) BP: 100/75 mmHg (04/16 0822) Pulse Rate: 108  (04/16 0822) Intake/Output from previous day: 04/15 0701 - 04/16 0700 In: 746 [P.O.:240; IV Piggyback:506] Out: 1200 [Urine:1200] Intake/Output from this shift: Total I/O In: 360 [P.O.:360] Out: -   Labs:  Basename 10/10/11 0500 10/09/11 2011  WBC 7.2 13.2*  HGB 12.2* 13.3  PLT 252 271  LABCREA -- --  CREATININE 1.33 1.49*   Estimated Creatinine Clearance: 66.1 ml/min (by C-G formula based on Cr of 1.33). No results found for this basename: VANCOTROUGH:2,VANCOPEAK:2,VANCORANDOM:2,GENTTROUGH:2,GENTPEAK:2,GENTRANDOM:2,TOBRATROUGH:2,TOBRAPEAK:2,TOBRARND:2,AMIKACINPEAK:2,AMIKACINTROU:2,AMIKACIN:2, in the last 72 hours   Microbiology: Recent Results (from the past 720 hour(s))  CULTURE, BLOOD (ROUTINE X 2)     Status: Normal   Collection Time   09/10/11  3:34 PM      Component Value Range Status Comment   Specimen Description BLOOD RIGHT HAND   Final    Special Requests BOTTLES DRAWN AEROBIC AND ANAEROBIC 10CC   Final    Culture  Setup Time [id number]   Final    Culture NO GROWTH 5 DAYS   Final    Report Status 09/16/2011 FINAL   Final   CULTURE, BLOOD (ROUTINE X 2)     Status: Normal   Collection Time   09/10/11  3:35 PM      Component Value Range Status Comment   Specimen Description BLOOD RIGHT ARM   Final    Special Requests BOTTLES DRAWN AEROBIC AND ANAEROBIC 10CC   Final    Culture  Setup  Time [id number]   Final    Culture NO GROWTH 5 DAYS   Final    Report Status 09/16/2011 FINAL   Final   CULTURE, SPUTUM-ASSESSMENT     Status: Normal   Collection Time   09/11/11  6:36 PM      Component Value Range Status Comment   Specimen Description SPUTUM   Final    Special Requests NONE   Final    Sputum evaluation     Final    Value: MICROSCOPIC FINDINGS SUGGEST THAT THIS SPECIMEN IS NOT REPRESENTATIVE OF LOWER RESPIRATORY SECRETIONS. PLEASE RECOLLECT.     CALLED TO RN E.SMITH AT 1923 09/11/11 BY L.PITT   Report Status 09/11/2011 FINAL   Final   CULTURE, SPUTUM-ASSESSMENT     Status: Normal   Collection Time   09/14/11  1:38 AM      Component Value Range Status Comment   Specimen Description SPUTUM   Final    Special Requests NONE   Final    Sputum evaluation     Final    Value: MICROSCOPIC FINDINGS SUGGEST THAT THIS SPECIMEN IS NOT REPRESENTATIVE OF LOWER RESPIRATORY SECRETIONS. PLEASE RECOLLECT.     CALLED TO A.BOEHLING,RN 0248 09/14/11 M.CAMPBELL   Report Status 09/14/2011 FINAL   Final   CULTURE, SPUTUM-ASSESSMENT     Status: Normal   Collection Time   09/14/11  9:24 PM      Component Value  Range Status Comment   Specimen Description SPUTUM   Final    Special Requests Normal   Final    Sputum evaluation     Final    Value: THIS SPECIMEN IS ACCEPTABLE. RESPIRATORY CULTURE REPORT TO FOLLOW.   Report Status 09/14/2011 FINAL   Final   CULTURE, RESPIRATORY     Status: Normal   Collection Time   09/14/11  9:24 PM      Component Value Range Status Comment   Specimen Description SPUTUM   Final    Special Requests NONE   Final    Gram Stain     Final    Value: RARE WBC PRESENT,BOTH PMN AND MONONUCLEAR     RARE SQUAMOUS EPITHELIAL CELLS PRESENT     NO ORGANISMS SEEN   Culture NORMAL OROPHARYNGEAL FLORA   Final    Report Status 09/17/2011 FINAL   Final   MRSA PCR SCREENING     Status: Normal   Collection Time   10/10/11  2:26 AM      Component Value Range Status Comment    MRSA by PCR NEGATIVE  NEGATIVE  Final    Medical History: Past Medical History  Diagnosis Date  . Chronic pain     back pain  . Stroke   . Hyperlipemia   . Lumbar disc disease   . Carotid artery disease   . Myocardial infarct   . Pneumonia   . CHF (congestive heart failure)   . COPD (chronic obstructive pulmonary disease)     on 2L O2 at home since 08/2011  . Coronary artery disease   . Shortness of breath    Medications:  Scheduled:    . albuterol  5 mg Nebulization Once  . antiseptic oral rinse  15 mL Mouth Rinse BID  . aspirin EC  81 mg Oral Daily  . carvedilol  3.125 mg Oral BID WC  . ceFEPime (MAXIPIME) IV  1 g Intravenous Once  . ceFEPime (MAXIPIME) IV  1 g Intravenous Q12H  . clopidogrel  75 mg Oral Q breakfast  . docusate sodium  100 mg Oral BID  . enoxaparin  1 mg/kg Subcutaneous BID  . furosemide  60 mg Intravenous Once  . furosemide  60 mg Intravenous BID  . levalbuterol  0.63 mg Nebulization Q6H  . methylPREDNISolone sodium succinate  125 mg Intravenous Once  . moxifloxacin  400 mg Intravenous QHS  . nicotine  14 mg Transdermal Daily  . ondansetron  4 mg Intravenous Once  . pneumococcal 23 valent vaccine  0.5 mL Intramuscular Tomorrow-1000  . sodium chloride  3 mL Intravenous Q12H  . sodium chloride  3 mL Intravenous Q12H  . vancomycin  1,000 mg Intravenous Once  . vancomycin  1,000 mg Intravenous Q12H  . DISCONTD: enoxaparin  90 mg Subcutaneous Once   Assessment: Good renal fxn AFebrile, WBC improved  Goal of Therapy:  Vancomycin trough level 15-20 mcg/ml  Plan: Cefepime 1gm iv q12hrs Vancomycin 1gm iv q12hrs Check trough at steady state.  Margo Aye, Taisia Fantini A 10/10/2011,8:57 AM

## 2011-10-10 NOTE — Progress Notes (Signed)
Subjective: 56 yo admitted with SOB (CHF vs PNA) Two recent hospitalizations- NSTEMI and then [facility]  Still with SOB No fever, no chills   Objective: Vital signs in last 24 hours: Filed Vitals:   10/10/11 0900 10/10/11 1000 10/10/11 1100 10/10/11 1400  BP: 120/84 114/78 104/66   Pulse: 107 106 106 105  Temp:      TempSrc:      Resp: 17 29 28 26   Height:      Weight:      SpO2: 94% 95% 95% 98%   Weight change:   Intake/Output Summary (Last 24 hours) at 10/10/11 1426 Last data filed at 10/10/11 1200  Gross per 24 hour  Intake   1715 ml  Output   1200 ml  Net    515 ml    Physical Exam: General: Awake, Oriented, mild respiratory effort  HEENT: EOMI. Neck: Supple CV: S1 and S2, tachy Lungs: crackles B/L, no wheezing Abdomen: Soft, Nontender, Nondistended, +bowel sounds. Ext: Good pulses. Trace edema.   Lab Results:  [facility] 10/10/11 0500 10/09/11 2011  NA 133* 136  K 4.0 4.8  CL 94* 97  CO2 23 26  GLUCOSE 216* 119*  BUN 32* 30*  CREATININE 1.33 1.49*  CALCIUM 9.2 9.8  MG -- --  PHOS -- --    Basename 10/10/11 0500 10/09/11 2011  AST 47* 58*  ALT 52 56*  ALKPHOS 102 111  BILITOT 0.8 1.0  PROT 7.4 7.8  ALBUMIN 3.5 3.9   No results found for this basename: LIPASE:2,AMYLASE:2 in the last 72 hours  Basename 10/10/11 0500 10/09/11 2011  WBC 7.2 13.2*  NEUTROABS -- 7.3  HGB 12.2* 13.3  HCT 37.8* 40.5  MCV 93.1 93.3  PLT 252 271    Basename 10/10/11 0941 10/10/11 0205  CKTOTAL 55 62  CKMB 4.1* 4.3*  CKMBINDEX -- --  TROPONINI <0.30 <0.30   No components found with this basename: POCBNP:3  Basename 10/09/11 2011  DDIMER 2.42*   No results found for this basename: HGBA1C:2 in the last 72 hours No results found for this basename: CHOL:2,HDL:2,LDLCALC:2,TRIG:2,CHOLHDL:2,LDLDIRECT:2 in the last 72 hours No results found for this basename: TSH,T4TOTAL,FREET3,T3FREE,THYROIDAB in the last 72 hours No results found for this basename:  VITAMINB12:2,FOLATE:2,FERRITIN:2,TIBC:2,IRON:2,RETICCTPCT:2 in the last 72 hours  Micro Results: Recent Results (from the past 240 hour(s))  MRSA PCR SCREENING     Status: Normal   Collection Time   10/10/11  2:26 AM      Component Value Range Status Comment   MRSA by PCR NEGATIVE  NEGATIVE  Final     Studies/Results: Dg Chest 2 View  10/09/2011  *RADIOLOGY REPORT*  Clinical Data: Shortness of breath.  Chronic pain.  History of MI. History of stroke, hyperlipidemia, carotid artery disease.  CHEST - 2 VIEW  Comparison: 09/28/2011  Findings: The heart is enlarged.  There are patchy airspace filling opacities bilaterally, right greater than left.  Bilateral pleural effusions are present, increased on the right since prior study.  IMPRESSION:  1.  Bilateral infiltrates. 2.  Increased right pleural effusion.  Original Report Authenticated By: Patterson Hammersmith, M.D.    Medications: I have reviewed the patient's current medications. Scheduled Meds:   . albuterol  5 mg Nebulization Once  . antiseptic oral rinse  15 mL Mouth Rinse BID  . aspirin EC  81 mg Oral Daily  . carvedilol  3.125 mg Oral BID WC  . ceFEPime (MAXIPIME) IV  1 g Intravenous Once  . ceFEPime (  MAXIPIME) IV  1 g Intravenous Q12H  . clopidogrel  75 mg Oral Q breakfast  . docusate sodium  100 mg Oral BID  . enoxaparin  1 mg/kg Subcutaneous BID  . furosemide  60 mg Intravenous Once  . furosemide  60 mg Intravenous BID  . levalbuterol  0.63 mg Nebulization Q6H  . methylPREDNISolone sodium succinate  125 mg Intravenous Once  . moxifloxacin  400 mg Intravenous QHS  . nicotine  14 mg Transdermal Daily  . ondansetron  4 mg Intravenous Once  . pneumococcal 23 valent vaccine  0.5 mL Intramuscular Tomorrow-1000  . sodium chloride  3 mL Intravenous Q12H  . sodium chloride  3 mL Intravenous Q12H  . vancomycin  1,000 mg Intravenous Once  . vancomycin  1,000 mg Intravenous Q12H  . DISCONTD: enoxaparin  90 mg Subcutaneous Once    Continuous Infusions:  PRN Meds:.acetaminophen, acetaminophen, ALPRAZolam, cyclobenzaprine, HYDROcodone-acetaminophen, levalbuterol, ondansetron (ZOFRAN) IV, ondansetron, technetium albumin aggregated, xenon xe 133  Assessment/Plan:   *Systolic congestive heart failure with reduced left ventricular function, NYHA class 2- (EF 35-40%) cardiology consulted, IV lasix, daily weights, strict I&Os- plan was for cath once he recovered from recent illnesses BNP elevated  ARF- improving with diuresis, monitor (on D/C it was 1.55- now 1.36)   History of CVA (cerebrovascular accident) without residual deficits- PT   Severe mitral regurgitation- recent echo   CAD (coronary artery disease)   HCAP (healthcare-associated pneumonia)- recent history, continue IV abx for now, had leukocytosis that has improved   Chronic respiratory failure- on 2L of O2 at home   COPD (chronic obstructive pulmonary disease)- stable   Acute respiratory failure- 4L O2 currently, full dose lovenox- plan to D/C this if V/Q scan negative for PE  ?transfer to [facility] if cath needed   LOS: 1 day  Robin Petrakis, DO 10/10/2011, 2:26 PM

## 2011-10-10 NOTE — Evaluation (Signed)
Physical Therapy Evaluation Patient Details Name: Charles Savage MRN: [id number] DOB: May 26, 1956 Today's Date: 10/10/2011  Problem List:  Patient Active Problem List  Diagnoses  . NSTEMI (non-ST elevated myocardial infarction)  . History of CVA (cerebrovascular accident) without residual deficits  . S/P carotid endarterectomy  . Hyperlipemia  . Lumbar disc disease  . Carotid artery disease  . Acute respiratory failure with hypoxia  . Abnormal EKG  . Tobacco abuse  . 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 Severe mitral regurgitation  . Acute renal failure secondary to ATN versus acute interstitial nephritis  . CAD (coronary artery disease)  . Interstitial lung disease  . HCAP (healthcare-associated pneumonia)  . Chronic respiratory failure  . COPD (chronic obstructive pulmonary disease)  . Acute respiratory failure    Past Medical History:  Past Medical History  Diagnosis Date  . Chronic pain     back pain  . Stroke   . Hyperlipemia   . Lumbar disc disease   . Carotid artery disease   . Myocardial infarct   . Pneumonia   . CHF (congestive heart failure)   . COPD (chronic obstructive pulmonary disease)     on 2L O2 at home since 08/2011  . Coronary artery disease   . Shortness of breath    Past Surgical History:  Past Surgical History  Procedure Date  . Back surgery 4098,[id number]  . Knee arthroscopy 2002    rt  . Foreign body removal 06/05/2011    Procedure: FOREIGN BODY REMOVAL ADULT;  Surgeon: Rosalio Macadamia;  Location: [facility];  Service: Plastics;  Laterality: Right;  removal foreign body from right side face   . Carotid endarterectomy     PT Assessment/Plan/Recommendation PT Assessment Clinical Impression Statement: Pt O2 remained at 100% with ambulation with 5 L of O2.  Pt has had PT has and is knowledgable in energy conservation and posture.  Pt limitations is no physcial rather  limitations are respiratory in nature.  Willl see 2x/week to ensure pt remains functional for ambulationl PT Recommendation/Assessment: Patient will need skilled PT in the acute care venue PT Problem List: Decreased activity tolerance;Cardiopulmonary status limiting activity PT Therapy Diagnosis : Difficulty walking PT Plan PT Frequency: Min 2X/week PT Treatment/Interventions: Gait training;Stair training PT Recommendation Follow Up Recommendations: No PT follow up Equipment Recommended: None recommended by PT PT Goals  Acute Rehab PT Goals PT Goal Formulation: With patient Time For Goal Achievement: 5 days Pt will Transfer Bed to Chair/Chair to Bed: Independently PT Transfer Goal: Bed to Chair/Chair to Bed - Progress: Met Pt will Ambulate: >150 feet;with modified independence PT Goal: Ambulate - Progress: Goal set today Pt will Go Up / Down Stairs: 3-5 stairs;with supervision;with rail(s) PT Goal: Up/Down Stairs - Progress: Goal set today Pt will Perform Home Exercise Program: Independently PT Goal: Perform Home Exercise Program - Progress: Goal set today  PT Evaluation Precautions/Restrictions  Precautions Precautions: None Restrictions Weight Bearing Restrictions: No Prior Functioning  Home Living Lives With: Spouse Type of Home: House Home Access: Stairs to enter Secretary/administrator of Steps: 5 Entrance Stairs-Rails: Right Home Layout: One level Bathroom Shower/Tub: Health visitor: Standard Home Adaptive Equipment: Straight cane Prior Function Level of Independence: Independent Able to Take Stairs?: Reciprically Driving: Yes Vocation: On disability Cognition Cognition Arousal/Alertness: Awake/alert Overall Cognitive Status: Appears within functional limits for tasks assessed Orientation Level: Oriented X4 Sensation/Coordination Sensation Light Touch: Appears Intact Coordination Gross  Motor Movements are Fluid and Coordinated: Yes Fine  Motor Movements are Fluid and Coordinated: Yes Extremity Assessment   Mobility (including Balance) Bed Mobility Bed Mobility: Yes Supine to Sit: 7: Independent Sitting - Scoot to Edge of Bed: 7: Independent Sit to Supine: 7: Independent Transfers Transfers: Yes Sit to Stand: 7: Independent Stand to Sit: 7: Independent Stand Pivot Transfers: 7: Independent Ambulation/Gait Ambulation/Gait: Yes Ambulation/Gait Assistance: 6: Modified independent (Device/Increase time) Ambulation/Gait Assistance Details (indicate cue type and reason): 02 Ambulation Distance (Feet): 150 Feet Assistive device: None Gait Pattern: Within Functional Limits Gait velocity: wnl Stairs: No Wheelchair Mobility Wheelchair Mobility: No    Exercise    End of Session PT - End of Session Equipment Utilized During Treatment: Gait belt Activity Tolerance: Patient tolerated treatment well Patient left: in bed Nurse Communication: Mobility status for transfers General Behavior During Session: [facility] for tasks performed Cognition: [facility] for tasks performed  Charles Savage,Charles Savage 10/10/2011, 2:10 PM

## 2011-10-10 NOTE — Progress Notes (Signed)
ANTIBIOTIC CONSULT NOTE - INITIAL  Pharmacy Consult for vancomycin & cefipime Indication: pneumonia  Allergies  Allergen Reactions  . Metoprolol Shortness Of Breath and Nausea And Vomiting  . Morphine And Related Hives and Itching  . Other Other (See Comments)    Peaches cause hives and itching   . Peach Flavor Hives and Itching    Patient Measurements: Height: 5\' 8"  (172.7 cm) Weight: 189 lb 9.5 oz (86 kg) IBW/kg (Calculated) : 68.4  Adjusted Body Weight: 75kg  Vital Signs: Temp: 97.8 F (36.6 C) (04/16 0150) Temp src: Oral (04/16 0150) BP: 120/82 mmHg (04/16 0149) Pulse Rate: 109  (04/16 0150) Intake/Output from previous day:   Intake/Output from this shift:    Labs:  [facility] 10/09/11 2011  WBC 13.2*  HGB 13.3  PLT 271  LABCREA --  CREATININE 1.49*   Estimated Creatinine Clearance: 59 ml/min (by C-G formula based on Cr of 1.49).   Microbiology: Recent Results (from the past 720 hour(s))  CULTURE, BLOOD (ROUTINE X 2)     Status: Normal   Collection Time   09/10/11  3:34 PM      Component Value Range Status Comment   Specimen Description BLOOD RIGHT HAND   Final    Special Requests BOTTLES DRAWN AEROBIC AND ANAEROBIC 10CC   Final    Culture  Setup Time [id number]   Final    Culture NO GROWTH 5 DAYS   Final    Report Status 09/16/2011 FINAL   Final   CULTURE, BLOOD (ROUTINE X 2)     Status: Normal   Collection Time   09/10/11  3:35 PM      Component Value Range Status Comment   Specimen Description BLOOD RIGHT ARM   Final    Special Requests BOTTLES DRAWN AEROBIC AND ANAEROBIC 10CC   Final    Culture  Setup Time [id number]   Final    Culture NO GROWTH 5 DAYS   Final    Report Status 09/16/2011 FINAL   Final   CULTURE, SPUTUM-ASSESSMENT     Status: Normal   Collection Time   09/11/11  6:36 PM      Component Value Range Status Comment   Specimen Description SPUTUM   Final    Special Requests NONE   Final    Sputum evaluation     Final    Value:  MICROSCOPIC FINDINGS SUGGEST THAT THIS SPECIMEN IS NOT REPRESENTATIVE OF LOWER RESPIRATORY SECRETIONS. PLEASE RECOLLECT.     CALLED TO RN E.SMITH AT 1923 09/11/11 BY L.PITT   Report Status 09/11/2011 FINAL   Final   CULTURE, SPUTUM-ASSESSMENT     Status: Normal   Collection Time   09/14/11  1:38 AM      Component Value Range Status Comment   Specimen Description SPUTUM   Final    Special Requests NONE   Final    Sputum evaluation     Final    Value: MICROSCOPIC FINDINGS SUGGEST THAT THIS SPECIMEN IS NOT REPRESENTATIVE OF LOWER RESPIRATORY SECRETIONS. PLEASE RECOLLECT.     CALLED TO A.BOEHLING,RN 0248 09/14/11 M.CAMPBELL   Report Status 09/14/2011 FINAL   Final   CULTURE, SPUTUM-ASSESSMENT     Status: Normal   Collection Time   09/14/11  9:24 PM      Component Value Range Status Comment   Specimen Description SPUTUM   Final    Special Requests Normal   Final    Sputum evaluation     Final  Value: THIS SPECIMEN IS ACCEPTABLE. RESPIRATORY CULTURE REPORT TO FOLLOW.   Report Status 09/14/2011 FINAL   Final   CULTURE, RESPIRATORY     Status: Normal   Collection Time   09/14/11  9:24 PM      Component Value Range Status Comment   Specimen Description SPUTUM   Final    Special Requests NONE   Final    Gram Stain     Final    Value: RARE WBC PRESENT,BOTH PMN AND MONONUCLEAR     RARE SQUAMOUS EPITHELIAL CELLS PRESENT     NO ORGANISMS SEEN   Culture NORMAL OROPHARYNGEAL FLORA   Final    Report Status 09/17/2011 FINAL   Final     Medical History: Past Medical History  Diagnosis Date  . Chronic pain     back pain  . Stroke   . Hyperlipemia   . Lumbar disc disease   . Carotid artery disease   . Myocardial infarct   . Pneumonia   . CHF (congestive heart failure)   . COPD (chronic obstructive pulmonary disease)     on 2L O2 at home since 08/2011  . Coronary artery disease   . Shortness of breath     Medications:     Moxiflocin, Cefipime, Vancomycin  Assessment:    Patient with  resp sx's, R/O exacerbation of previously treated pneumonia & hospitalization approx 7 days ago.  Goal of Therapy:  Coverage for pneumonia.  Plan:  Initial doses of cefipime and vancomycin to be given tonight.  Day shift clinical pharmacist to review case for maintenance regimens.  Shirley Muscat E 10/10/2011,2:31 AM

## 2011-10-10 NOTE — Consult Note (Signed)
CARDIOLOGY CONSULT NOTE  Patient ID: Charles Savage MRN: [id number] DOB/AGE: [birth year] 56 y.o.  Admit date: 10/09/2011 Referring Physician:PTH Primary PhysicianGOLDING,JOHN CABOT, MD, MD Primary Cardiologist:Tilley Reason for Consultation: CHF, Systolic dysfunction, and severe MR Principal Problem:  *Systolic congestive heart failure with reduced left ventricular function, NYHA class 2 Active Problems:  History of CVA (cerebrovascular accident) without residual deficits  Severe mitral regurgitation  CAD (coronary artery disease)  HCAP (healthcare-associated pneumonia)  Chronic respiratory failure  COPD (chronic obstructive pulmonary disease)  Acute respiratory failure  HPI: Charles Savage is a 56 y/o patient with multiple medical problems which began in February of this year. He was admitted to [facility] in Feb with CVA requiring left carotid endarterectomy, with subsequent CVA post-operatively causing transient dysphasia.  He was home 3 weeks whenhe was readmitted for pneumonia at [facility] hospital after evaluation at [facility] and transfer.  He had a 10 day hospitalization in [facility] hospital in March for treatment and was also found to have, NSTEMI, acute renal failure, systolic dysfunction with EF of 35%-40%,severe MR per echo obtained at that time. He has a, history of ETOH abuse (stopped 15 months ago) tobacco abuse (stopped 1 month ago) and COPD.       He was seen by Dr. Donnie Aho during recent admsision with plans for cardiac cath in the setting of systolic dysfunction and NSTEMI, with chest CT demonstrating coronary artery calcifications.. However, renal function prohibited this and he was treated medically with ASA, continued on  plavix that was started post CVA, and metoprolol. ACE inhibitor was not given in the setting of renal failure on discharge Creatinine of 2.60 when he left [facility]. He was treated with abx for pneumonia, placed on O2 for home use and was to follow-up with Dr. Donnie Aho as OP.      He saw Dr. Donnie Aho last Friday, (no notes are seen in Epic) and told his renal function was improving, and he would plan to see him again in 3 weeks for re-evaluation and plan for cardiac cath. By Sunday, the patient's symptoms worsened with increased dyspnea walking less than 6 ft. He has coughing but nonproductively. He became extremely full in his chest with LEE and reported back to ER. On arrival to ER BP 131/77, pulse 109 with respirations of 44. He was afebrile. O2 sat of 94% on 2/L. Creatinine was 1.49.,elevated liver enzymes were noted with AST of 58, ALT of 56. He was not anemic but WBC's were elevated at 13.2. CXR revealed bilateral infiltrates and increased right pleural effusion.BNP 14,487.(BNP on discharge 4819) Cardiac enzymes were negative. VQ scan was negative for PE  He was treated with lasix, solumedrol and albuterol.    His breathing status has improved, but has diuresed very little with only 1 lb wt loss. LEE has improved, He remains weak and mildly dyspneic when speaking. He denies chest pain, dizziness or nausea.         Review of systems complete and found to be negative unless listed above   Past Medical History  Diagnosis Date  . Chronic pain     back pain  . Stroke   . Hyperlipemia   . Lumbar disc disease   . Carotid artery disease   . Myocardial infarct   . Pneumonia   . CHF (congestive heart failure)   . COPD (chronic obstructive pulmonary disease)     on 2L O2 at home since 08/2011  . Coronary artery disease   . Shortness of breath  Family History  Problem Relation Age of Onset  . Coronary artery disease Mother   . Coronary artery disease Father   . Arrhythmia Brother     History   Social History  . Marital Status: Legally Separated    Spouse Name: N/A    Number of Children: N/A  . Years of Education: N/A   Occupational History  . Not on file.   Social History Main Topics  . Smoking status: Former Smoker -- 2.0 packs/day for 30 years     Types: Cigarettes    Quit date: 09/10/2011  . Smokeless tobacco: Not on file  . Alcohol Use: No  . Drug Use: No  . Sexually Active:      Quit drinking alcohol 2 yrs ago   Other Topics Concern  . 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    Past Surgical History  Procedure Date  . Back surgery 4540,9811  . Knee arthroscopy 2002    rt  . Foreign body removal 06/05/2011    Procedure: FOREIGN BODY REMOVAL ADULT;  Surgeon: Rosalio Macadamia;  Location: [facility];  Service: Plastics;  Laterality: Right;  removal foreign body from right side face   . Carotid endarterectomy      Prescriptions prior to admission  Medication Sig Dispense Refill  . albuterol (PROVENTIL HFA;VENTOLIN HFA) 108 (90 BASE) MCG/ACT inhaler Inhale 1-2 puffs into the lungs every 6 (six) hours as needed for wheezing.  1 Inhaler  0  . ALPRAZolam (XANAX) 0.25 MG tablet Take 1 tablet (0.25 mg total) by mouth 3 (three) times daily as needed for anxiety.  30 tablet  0  . aspirin EC 81 MG EC tablet Take 1 tablet (81 mg total) by mouth daily.  30 tablet  0  . carvedilol (COREG) 3.125 MG tablet Take 1 tablet (3.125 mg total) by mouth 2 (two) times daily with a meal.  60 tablet  0  . clopidogrel (PLAVIX) 75 MG tablet Take 1 tablet (75 mg total) by mouth daily with breakfast.  30 tablet  0  . cyclobenzaprine (FLEXERIL) 10 MG tablet Take 10 mg by mouth 3 (three) times daily as needed.        Marland Kitchen guaiFENesin (MUCINEX) 600 MG 12 hr tablet Take 2 tablets (1,200 mg total) by mouth 2 (two) times daily.  30 tablet  0  . HYDROcodone-acetaminophen (NORCO) 10-325 MG per tablet Take 1 tablet by mouth every 8 (eight) hours as needed. Pain      . nicotine (NICODERM CQ - DOSED IN MG/24 HOURS) 14 mg/24hr patch Place 1 patch onto the skin daily.      Marland Kitchen moxifloxacin (AVELOX) 400 MG tablet Take 1 tablet (400 mg total) by mouth daily at 6 PM.  7 tablet  0    Echocardiogram:09/19/2011 Study Conclusions  Left ventricle: The cavity size was mildly dilated. Systolic function was moderately reduced. The estimated ejection fraction was in the range of 35% to 40%. Diffuse hypokinesis. Severe hypokinesis of the inferior myocardium. - Aortic valve: Mild regurgitation. - Mitral valve: Moderate to severe regurgitation. - Left atrium: The atrium was moderately dilated. - Pulmonary arteries: Systolic pressure was mildly increased.  Physical Exam: Blood pressure 104/66, pulse 106, temperature 97.3 F (36.3 C), temperature source Oral, resp. rate 28, height 5\' 8"  (1.727 m), weight 188 lb 11.4 oz (85.6 kg), SpO2 96.00%.   General: Well developed, well nourished, short  of breath with speaking Head: Eyes PERRLA, No xanthomas.   Normal cephalic and atramatic  Lungs: Coarse breath sounds are noted without wheezes or rhonchi, prolonged expiratory phase; decreased breath sounds at the right base with dullness to percussion; rales at the left base. Heart: HRRR S1 S2, tachycardic; grade 1-2/6 apical holosystolic murmur.  Pulses are 2+ & equal.            No carotid bruit. + JVD.  No abdominal bruits. No femoral bruits. Abdomen: Bowel sounds are positive, abdomen distended and non-tender without masses or                  Hernia's noted. Msk:  Back normal diminishedl strength and tone for age. Extremities: No clubbing, cyanosis; trace pedal edema; 1-2+ presacral edema.  DP +1 Neuro: Alert and oriented X 3. Psych:  Good affect, responds appropriately  Weight is 86 kg, stable but decreased 10 kg from 8 months ago; I&O +1.2 L since admission  Labs:   Lab Results  Component Value Date   WBC 7.2 10/10/2011   HGB 12.2* 10/10/2011   HCT 37.8* 10/10/2011   MCV 93.1 10/10/2011   PLT 252 10/10/2011     Lab 10/10/11 0500  NA 133*  K 4.0  CL 94*  CO2 23  BUN 32*  CREATININE 1.33  CALCIUM 9.2  PROT 7.4  BILITOT 0.8  ALKPHOS 102  ALT 52  AST 47*  GLUCOSE  216*   Lab Results  Component Value Date   CKTOTAL 55 10/10/2011   CKMB 4.1* 10/10/2011   TROPONINI <0.30 10/10/2011    Lab Results  Component Value Date   CHOL 149 09/11/2011   Lab Results  Component Value Date   HDL 20* 09/11/2011   Lab Results  Component Value Date   LDLCALC 104* 09/11/2011   Lab Results  Component Value Date   TRIG 125 09/11/2011   Lab Results  Component Value Date   CHOLHDL 7.5 09/11/2011   No results found for this basename: LDLDIRECT      Radiology: Dg Chest 2 View  10/09/2011  *RADIOLOGY REPORT*  Clinical Data: Shortness of breath.  Chronic pain.  History of MI. History of stroke, hyperlipidemia, carotid artery disease.  CHEST - 2 VIEW  Comparison: 09/28/2011  Findings: The heart is enlarged.  There are patchy airspace filling opacities bilaterally, right greater than left.  Bilateral pleural effusions are present, increased on the right since prior study.  IMPRESSION:  1.  Bilateral infiltrates. 2.  Increased right pleural effusion.  Original Report Authenticated By: Patterson Hammersmith, M.D.   Nm Pulmonary Per & Vent  10/10/2011  *RADIOLOGY REPORT*  Clinical Data: Shortness of breath.  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  No areas of VQ mismatch.  Study is low probability for pulmonary embolus.  Original Report Authenticated By: Cyndie Chime, M.D.   EKG: Sinus tachycardia with rate of 111 bpm. Nonspecific inferior lateral T-wave abnormalities. ASSESSMENT AND PLAN:   1. Acute on chronic systolic CHF in the setting of severe systolic dysfunction: (EF 35%-40%)  He has had worsening shortness of breath and fluid retention for the last 2 days prior to admission.  BNP on admission was 14,487. He is now on furosemide 60 mg IV twice a day. I am not seeing any significant diuresis . However, her breathing status and lower extremity edema have improved. Creatinine is 1.33. Cardiomyopathy may be  related to EtOH abuse with long term heavy use with beer stopping 15 months ago. He has not had an echocardiogram prior to being admitted to [facility] in February of 2013. We will request those results for evaluation of LV systolic function at that time. We will continue his Coreg. Spironolactone  and will also be instituted. Would continue strict I&O and daily weights for evaluation of his fluid status.  2. Mitral regurgitation: He will need better heart rate control. Will increase carvedilol to 6.25 mg twice a day. We will not repeat echo as wants just completed a month ago. Review of notes from Dr. Arlyn Leak also states that he was unable to see her a murmur during his assessments. He will eventually need a cardiac catheterization for further evaluation after pneumonia and CHF have been adequately managed.  3. Pneumonia: He continues on IV Avelox, vancomycin and cefepime treatment. He states that he is coughing productively now. He also states her breathing treatments are working better for him than when he was first admitted.   4. CAD: Abnormal CT scan during hospitalization at [facility] in March of 2012 revealing coronary artery calcifications. CAD could be the etiology for his cardiomyopathy.  Continue aspirin, beta blocker, and continue Plavix.    Bettey Mare. Lyman Bishop NP [facility] 10/10/2011, 4:27 PM  Cardiology Attending Patient interviewed and examined. Discussed with Joni Reining, NP.  Above note annotated and modified based upon my findings.  Patient presents with chronic pulmonary disease, likely related to COPD, a recent history of pneumonia and interstitial disease that has been fairly persistent since he was first seen one month ago. It may be that CHF has been a significant problem all along. We will adjust diuretics to achieve a significant net negative fluid balance and closely follow clinical status and renal function. Since discharge from [facility], he has not been stable  enough to allow [place] to address his cardiac issues. We need to correct that situation within the next few weeks.  Mitral regurgitation is likely significant and possibly related to coronary disease and previous myocardial infarction.  He will need treatment with an ACE inhibitor in addition to his other cardiac medications.  Records will be obtained from [facility] to determine the onset of his cardiac problems, if possible. The minimal cardiac marker elevation present when he first came to the [place] system does not account for moderate left ventricular dysfunction and mitral regurgitation.  Renal function has improved dramatically. This is consistent with ATN at that time he presented.  [place], MD 10/10/2011, 7:00 PM

## 2011-10-10 NOTE — H&P (Signed)
Charles Savage is an 56 y.o. male.    PCP: Colette Ribas, MD, MD  His cardiologist is Dr. Donnie Aho  Chief Complaint: Shortness of breath since the afternoon  HPI: This is a 56 year old, Caucasian male, with a past medical history of mitral regurgitation, stroke, hypertension. He had a non-ST elevation MI in March when he was admitted at [facility]. Patient was also admitted here recently in April for healthcare associated pneumonia. Patient was discharged about a week ago, and he had been doing well. However, in the last 2-3 days he noticed that he was becoming more short of breath with exertion. Subsequently became short of breath even at rest. He noticed leg swelling. Denies any fever, but has had a dry cough. Denies any chest pain. Had nausea without any emesis. No abdominal pain. No sick contacts. He had some evidence for orthopnea. Because the symptoms are not getting any better he decided to come in to the hospital.   Home Medications: Prior to Admission medications   Medication Sig Start Date End Date Taking? Authorizing Provider  albuterol (PROVENTIL HFA;VENTOLIN HFA) 108 (90 BASE) MCG/ACT inhaler Inhale 1-2 puffs into the lungs every 6 (six) hours as needed for wheezing. 09/27/11 09/26/12 Yes Shelda Jakes, MD  ALPRAZolam Prudy Feeler) 0.25 MG tablet Take 1 tablet (0.25 mg total) by mouth 3 (three) times daily as needed for anxiety. 10/02/11 11/01/11 Yes Nimish Normajean Glasgow, MD  aspirin EC 81 MG EC tablet Take 1 tablet (81 mg total) by mouth daily. 09/20/11 09/19/12 Yes Simbiso Ranga, MD  carvedilol (COREG) 3.125 MG tablet Take 1 tablet (3.125 mg total) by mouth 2 (two) times daily with a meal. 10/02/11 10/01/12 Yes Nimish Normajean Glasgow, MD  clopidogrel (PLAVIX) 75 MG tablet Take 1 tablet (75 mg total) by mouth daily with breakfast. 09/20/11 09/19/12 Yes Simbiso Ranga, MD  cyclobenzaprine (FLEXERIL) 10 MG tablet Take 10 mg by mouth 3 (three) times daily as needed.     Yes Historical Provider, MD    guaiFENesin (MUCINEX) 600 MG 12 hr tablet Take 2 tablets (1,200 mg total) by mouth 2 (two) times daily. 10/02/11 10/01/12 Yes Nimish Normajean Glasgow, MD  HYDROcodone-acetaminophen (NORCO) 10-325 MG per tablet Take 1 tablet by mouth every 8 (eight) hours as needed. Pain   Yes Historical Provider, MD  nicotine (NICODERM CQ - DOSED IN MG/24 HOURS) 14 mg/24hr patch Place 1 patch onto the skin daily.   Yes Historical Provider, MD  moxifloxacin (AVELOX) 400 MG tablet Take 1 tablet (400 mg total) by mouth daily at 6 PM. 10/02/11 10/12/11  Wilson Singer, MD    Allergies:  Allergies  Allergen Reactions  . Metoprolol Shortness Of Breath and Nausea And Vomiting  . Morphine And Related Hives and Itching  . Other Other (See Comments)    Peaches cause hives and itching   . Peach Flavor Hives and Itching    Past Medical History: Past Medical History  Diagnosis Date  . Chronic pain     back pain  . Stroke   . Hyperlipemia   . Lumbar disc disease   . Carotid artery disease   . Myocardial infarct   . Pneumonia   . CHF (congestive heart failure)     Past Surgical History  Procedure Date  . Back surgery [id number]  . Knee arthroscopy 2002    rt  . Foreign body removal 06/05/2011    Procedure: FOREIGN BODY REMOVAL ADULT;  Surgeon: Rosalio Macadamia;  Location: [facility];  Service: Plastics;  Laterality: Right;  removal foreign body from right side face   . Carotid endarterectomy     Social History:  reports that he has quit smoking. His smoking use included Cigarettes. He has a 60 pack-year smoking history. He does not have any smokeless tobacco history on file. He reports that he does not drink alcohol or use illicit drugs.  Family History:  Family History  Problem Relation Age of Onset  . Coronary artery disease Mother   . Coronary artery disease Father     Review of Systems - History obtained from the patient General ROS: positive for  - fatigue Psychological ROS:  negative Ophthalmic ROS: negative ENT ROS: negative Allergy and Immunology ROS: negative Hematological and Lymphatic ROS: negative Endocrine ROS: negative Respiratory ROS: see hpi Cardiovascular ROS: see hpi Gastrointestinal ROS: negative Genito-Urinary ROS: negative Musculoskeletal ROS: negative Neurological ROS: negative Dermatological ROS: negative  Physical Examination Blood pressure 128/91, pulse 113, temperature 97.4 F (36.3 C), temperature source Oral, resp. rate 29, height 5\' 8"  (1.727 m), weight 86.183 kg (190 lb), SpO2 98.00%.  General appearance: alert, cooperative, appears stated age and no distress Head: Normocephalic, without obvious abnormality, atraumatic Eyes: conjunctivae/corneas clear. PERRL, EOM's intact.  Throat: lips, mucosa, and tongue normal; teeth and gums normal Neck: no adenopathy, no carotid bruit, no JVD, supple, symmetrical, trachea midline and thyroid not enlarged, symmetric, no tenderness/mass/nodules Back: symmetric, no curvature. ROM normal. No CVA tenderness. Resp: Decreased air entry at the bases with crackles. No wheezing. Dullness to percussion at the bases. Cardio: regular rate and rhythm, S1, S2 normal, no murmur, click, rub or gallop GI: soft, non-tender; bowel sounds normal; no masses,  no organomegaly Extremities: edema 1+ Pulses: 2+ and symmetric Skin: Skin color, texture, turgor normal. No rashes or lesions Lymph nodes: Cervical, supraclavicular, and axillary nodes normal. Neurologic: Grossly normal  Laboratory Data: Results for orders placed during the hospital encounter of 10/09/11 (from the past 48 hour(s))  CBC     Status: Abnormal   Collection Time   10/09/11  8:11 PM      Component Value Range Comment   WBC 13.2 (*) 4.0 - 10.5 (K/uL)    RBC 4.34  4.22 - 5.81 (MIL/uL)    Hemoglobin 13.3  13.0 - 17.0 (g/dL)    HCT 16.1  09.6 - 04.5 (%)    MCV 93.3  78.0 - 100.0 (fL)    MCH 30.6  26.0 - 34.0 (pg)    MCHC 32.8  30.0 - 36.0  (g/dL)    RDW 40.9  81.1 - 91.4 (%)    Platelets 271  150 - 400 (K/uL)   DIFFERENTIAL     Status: Abnormal   Collection Time   10/09/11  8:11 PM      Component Value Range Comment   Neutrophils Relative 54  43 - 77 (%)    Neutro Abs 7.3  1.7 - 7.7 (K/uL)    Lymphocytes Relative 33  12 - 46 (%)    Lymphs Abs 4.3 (*) 0.7 - 4.0 (K/uL)    Monocytes Relative 11  3 - 12 (%)    Monocytes Absolute 1.4 (*) 0.1 - 1.0 (K/uL)    Eosinophils Relative 1  0 - 5 (%)    Eosinophils Absolute 0.1  0.0 - 0.7 (K/uL)    Basophils Relative 1  0 - 1 (%)    Basophils Absolute 0.1  0.0 - 0.1 (K/uL)   COMPREHENSIVE METABOLIC PANEL  Status: Abnormal   Collection Time   10/09/11  8:11 PM      Component Value Range Comment   Sodium 136  135 - 145 (mEq/L)    Potassium 4.8  3.5 - 5.1 (mEq/L)    Chloride 97  96 - 112 (mEq/L)    CO2 26  19 - 32 (mEq/L)    Glucose, Bld 119 (*) 70 - 99 (mg/dL)    BUN 30 (*) 6 - 23 (mg/dL)    Creatinine, Ser 8.65 (*) 0.50 - 1.35 (mg/dL)    Calcium 9.8  8.4 - 10.5 (mg/dL)    Total Protein 7.8  6.0 - 8.3 (g/dL)    Albumin 3.9  3.5 - 5.2 (g/dL)    AST 58 (*) 0 - 37 (U/L)    ALT 56 (*) 0 - 53 (U/L)    Alkaline Phosphatase 111  39 - 117 (U/L)    Total Bilirubin 1.0  0.3 - 1.2 (mg/dL)    GFR calc non Af Amer 51 (*) >90 (mL/min)    GFR calc Af Amer 59 (*) >90 (mL/min)   D-DIMER, QUANTITATIVE     Status: Abnormal   Collection Time   10/09/11  8:11 PM      Component Value Range Comment   D-Dimer, Quant 2.42 (*) 0.00 - 0.48 (ug/mL-FEU)   PRO B NATRIURETIC PEPTIDE     Status: Abnormal   Collection Time   10/09/11  8:11 PM      Component Value Range Comment   Pro B Natriuretic peptide (BNP) 14487.0 (*) 0 - 125 (pg/mL)   BLOOD GAS, ARTERIAL     Status: Abnormal   Collection Time   10/09/11  9:32 PM      Component Value Range Comment   O2 Content 4.0      Delivery systems NASAL CANNULA      pH, Arterial 7.467 (*) 7.350 - 7.450     pCO2 arterial 31.1 (*) 35.0 - 45.0 (mmHg)    pO2,  Arterial 81.8  80.0 - 100.0 (mmHg)    Bicarbonate 22.2  20.0 - 24.0 (mEq/L)    TCO2 19.5  0 - 100 (mmol/L)    Acid-base deficit 1.1  0.0 - 2.0 (mmol/L)    O2 Saturation 96.1      Patient temperature 37.0      Collection site LEFT BRACHIAL      Drawn by [id number]      Sample type ARTERIAL      Allens test (pass/fail) PASS  PASS    POCT I-STAT TROPONIN I     Status: Normal   Collection Time   10/09/11 10:11 PM      Component Value Range Comment   Troponin i, poc 0.01  0.00 - 0.08 (ng/mL)    Comment 3            URINALYSIS, ROUTINE W REFLEX MICROSCOPIC     Status: Abnormal   Collection Time   10/09/11 10:22 PM      Component Value Range Comment   Color, Urine YELLOW  YELLOW     APPearance CLEAR  CLEAR     Specific Gravity, Urine >1.030 (*) 1.005 - 1.030     pH 5.5  5.0 - 8.0     Glucose, UA NEGATIVE  NEGATIVE (mg/dL)    Hgb urine dipstick TRACE (*) NEGATIVE     Bilirubin Urine NEGATIVE  NEGATIVE     Ketones, ur NEGATIVE  NEGATIVE (mg/dL)    Protein, ur TRACE (*)  NEGATIVE (mg/dL)    Urobilinogen, UA 0.2  0.0 - 1.0 (mg/dL)    Nitrite NEGATIVE  NEGATIVE     Leukocytes, UA NEGATIVE  NEGATIVE    URINE MICROSCOPIC-ADD ON     Status: Abnormal   Collection Time   10/09/11 10:22 PM      Component Value Range Comment   Squamous Epithelial / LPF RARE  RARE     WBC, UA 3-6  <3 (WBC/hpf)    RBC / HPF 0-2  <3 (RBC/hpf)    Bacteria, UA RARE  RARE     Casts HYALINE CASTS (*) NEGATIVE     Urine-Other MUCOUS PRESENT       Radiology Reports: Dg Chest 2 View  10/09/2011  *RADIOLOGY REPORT*  Clinical Data: Shortness of breath.  Chronic pain.  History of MI. History of stroke, hyperlipidemia, carotid artery disease.  CHEST - 2 VIEW  Comparison: 09/28/2011  Findings: The heart is enlarged.  There are patchy airspace filling opacities bilaterally, right greater than left.  Bilateral pleural effusions are present, increased on the right since prior study.  IMPRESSION:  1.  Bilateral infiltrates. 2.   Increased right pleural effusion.  Original Report Authenticated By: Patterson Hammersmith, M.D.    Electrocardiogram: Sinus tachycardia at 107 beats per minute. Normal axis. Normal Intervals. No Q waves. No concerning ST or T-wave changes are noted.  Assessment/Plan  Principal Problem:  *Systolic congestive heart failure with reduced left ventricular function, NYHA class 2 Active Problems:  History of CVA (cerebrovascular accident) without residual deficits  Severe mitral regurgitation  CAD (coronary artery disease)  HCAP (healthcare-associated pneumonia)  Chronic respiratory failure  COPD (chronic obstructive pulmonary disease)  Acute respiratory failure   #1 acute systolic congestive heart failure: His EF is about 35-40%. This is the most likely explanation for his symptoms although healthcare associated pneumonia cannot be ruled out. We will put him on IV Lasix for now. No need to repeat echocardiogram as he had one recently. Because of his renal insufficiency he cannot be given ACE inhibitors. I'm sure the mitral regurgitation is also contributing. Consideration may be given to obtaining cardiology input in the morning. May need to discuss this further with Dr. Donnie Aho. The plan was for the patient undergo a cardiac catheterization once he was over his acute infectious disease. It may be time to consider doing this on an earlier date. He'll be admitted to step down unit for now. Strict in's and outs and daily weights. Cardiac enzymes will be cycled.  #2 acute respiratory failure: This is most likely due to CHF exacerbation. Pneumonia is also a possibility as mentioned above. His d-dimer was elevated. We will proceed with a VQ scan.  #3 possible healthcare associated pneumonia. Will be treated with broad-spectrum antibiotics for now.  #4 COPD: Continue with nebulizer treatments.  #5 chronic kidney disease: His creatinine was worse a few weeks ago. It appears to be close to baseline  currently. Continue to monitor creatinine, while he is on the Lasix.  He'll be on full dose anticoagulation till VQ scan is completed.  He is a full code.  Further management decisions will depend on results of further testing and patient's response to treatment  [facility]  [facility] Pager [phone]  10/10/2011, 1:18 AM

## 2011-10-11 ENCOUNTER — Encounter (HOSPITAL_COMMUNITY): Payer: Medicaid Other

## 2011-10-11 DIAGNOSIS — N179 Acute kidney failure, unspecified: Secondary | ICD-10-CM

## 2011-10-11 DIAGNOSIS — I059 Rheumatic mitral valve disease, unspecified: Secondary | ICD-10-CM

## 2011-10-11 DIAGNOSIS — R739 Hyperglycemia, unspecified: Secondary | ICD-10-CM | POA: Diagnosis present

## 2011-10-11 DIAGNOSIS — I5020 Unspecified systolic (congestive) heart failure: Secondary | ICD-10-CM

## 2011-10-11 DIAGNOSIS — I2510 Atherosclerotic heart disease of native coronary artery without angina pectoris: Secondary | ICD-10-CM

## 2011-10-11 LAB — BASIC METABOLIC PANEL
CO2: 27 mEq/L (ref 19–32)
GFR calc non Af Amer: 43 mL/min — ABNORMAL LOW (ref >90)
Glucose, Bld: 128 mg/dL — ABNORMAL HIGH (ref 70–99)
Potassium: 3.7 mEq/L (ref 3.5–5.1)
Sodium: 134 mEq/L — ABNORMAL LOW (ref 135–145)

## 2011-10-11 LAB — URINE CULTURE
Colony Count: NO GROWTH
Culture  Setup Time: 201304161705

## 2011-10-11 MED ORDER — FUROSEMIDE 10 MG/ML IJ SOLN
60.0000 mg | Freq: Every day | INTRAMUSCULAR | Status: DC
  Administered 2011-10-11: 60 mg via INTRAVENOUS
  Filled 2011-10-11: qty 6

## 2011-10-11 MED ORDER — POTASSIUM CHLORIDE CRYS ER 20 MEQ PO TBCR
20.0000 meq | EXTENDED_RELEASE_TABLET | Freq: Two times a day (BID) | ORAL | Status: AC
  Administered 2011-10-11 (×2): 20 meq via ORAL
  Filled 2011-10-11 (×2): qty 1

## 2011-10-11 NOTE — Progress Notes (Signed)
Patients blood pressure remains consistently in the hypotensive range. Pt having orthostatic hypotension with dizziness when changing position.  Pt has had a change in medications and Dr. Sherrie Mustache notified.  Will continue to monitor patient at this time.  Talked with [facility] concerning patients medications and reason for low blood pressure.  Pt is resting quietly with no acute distress at this time.

## 2011-10-11 NOTE — Progress Notes (Signed)
Subjective: The patient still feels short of breath. He denies chest pain and purulent cough.  Objective: Vital signs in last 24 hours: Filed Vitals:   10/11/11 0500 10/11/11 0600 10/11/11 0721 10/11/11 0754  BP: 80/51 92/52  92/56  Pulse: 84 81  90  Temp:      TempSrc:      Resp: 14 12    Height:      Weight: 86.2 kg (190 lb 0.6 oz)     SpO2: 97% 95% 99%     Intake/Output Summary (Last 24 hours) at 10/11/11 0800 Last data filed at 10/11/11 0758  Gross per 24 hour  Intake   2085 ml  Output    500 ml  Net   1585 ml    Weight change: 0.016 kg (0.6 oz)  Physical exam: Lungs: Bibasilar crackles. Breathing appears to be non-labored. Heart: S1, S2, with questionable S3 gallop and 2/6 systolic murmur.. Abdomen: Positive bowel sounds, soft, nontender, nondistended. Extremities: Trace to 1+ bilateral pedal edema.  Lab Results: Basic Metabolic Panel:  Basename 10/11/11 0543 10/10/11 0500  NA 134* 133*  K 3.7 4.0  CL 96 94*  CO2 27 23  GLUCOSE 128* 216*  BUN 45* 32*  CREATININE 1.70* 1.33  CALCIUM 8.4 9.2  MG -- --  PHOS -- --   Liver Function Tests:  Basename 10/10/11 0500 10/09/11 2011  AST 47* 58*  ALT 52 56*  ALKPHOS 102 111  BILITOT 0.8 1.0  PROT 7.4 7.8  ALBUMIN 3.5 3.9   No results found for this basename: LIPASE:2,AMYLASE:2 in the last 72 hours No results found for this basename: AMMONIA:2 in the last 72 hours CBC:  Basename 10/10/11 0500 10/09/11 2011  WBC 7.2 13.2*  NEUTROABS -- 7.3  HGB 12.2* 13.3  HCT 37.8* 40.5  MCV 93.1 93.3  PLT 252 271   Cardiac Enzymes:  Basename 10/10/11 1743 10/10/11 0941 10/10/11 0205  CKTOTAL 58 55 62  CKMB 4.0 4.1* 4.3*  CKMBINDEX -- -- --  TROPONINI <0.30 <0.30 <0.30   BNP:  [facility] 10/09/11 2011  PROBNP 14487.0*   D-Dimer:  Alvira Philips 10/09/11 2011  DDIMER 2.42*   CBG: No results found for this basename: GLUCAP:6 in the last 72 hours Hemoglobin A1C: No results found for this basename: HGBA1C in the  last 72 hours Fasting Lipid Panel: No results found for this basename: CHOL,HDL,LDLCALC,TRIG,CHOLHDL,LDLDIRECT in the last 72 hours Thyroid Function Tests:  Basename 10/10/11 0230  TSH 2.646  T4TOTAL --  FREET4 --  T3FREE --  THYROIDAB --   Anemia Panel: No results found for this basename: VITAMINB12,FOLATE,FERRITIN,TIBC,IRON,RETICCTPCT in the last 72 hours Coagulation:  Basename 10/10/11 0500  LABPROT 18.2*  INR 1.48   Urine Drug Screen: Drugs of Abuse  No results found for this basename: labopia,  cocainscrnur,  labbenz,  amphetmu,  thcu,  labbarb    Alcohol Level: No results found for this basename: ETH:2 in the last 72 hours Urinalysis:  Basename 10/09/11 2222  COLORURINE YELLOW  LABSPEC >1.030*  PHURINE 5.5  GLUCOSEU NEGATIVE  HGBUR TRACE*  BILIRUBINUR NEGATIVE  KETONESUR NEGATIVE  PROTEINUR TRACE*  UROBILINOGEN 0.2  NITRITE NEGATIVE  LEUKOCYTESUR NEGATIVE   Misc. Labs:   Micro: Recent Results (from the past 240 hour(s))  MRSA PCR SCREENING     Status: Normal   Collection Time   10/10/11  2:26 AM      Component Value Range Status Comment   MRSA by PCR NEGATIVE  NEGATIVE  Final  Studies/Results: Dg Chest 2 View  10/09/2011  *RADIOLOGY REPORT*  Clinical Data: Shortness of breath.  Chronic pain.  History of MI. History of stroke, hyperlipidemia, carotid artery disease.  CHEST - 2 VIEW  Comparison: 09/28/2011  Findings: The heart is enlarged.  There are patchy airspace filling opacities bilaterally, right greater than left.  Bilateral pleural effusions are present, increased on the right since prior study.  IMPRESSION:  1.  Bilateral infiltrates. 2.  Increased right pleural effusion.  Original Report Authenticated By: Patterson Hammersmith, M.D.   Nm Pulmonary Per & Vent  10/10/2011  *RADIOLOGY REPORT*  Clinical Data: Shortness of breath.  NM PULMONARY VENTILATION AND PERFUSION SCAN  Radiopharmaceutical: 2.8MILLI CURIE MAA TECHNETIUM TO 16M ALBUMIN  AGGREGATED, CURIE xenon xe 133 gas 10 milli Curie XENON XE 133 GAS  Comparison: Chest x-ray 10/09/2011  Findings: Slight decreased generalized perfusion and ventilation in the right lung.  More focal matched ventilation and perfusion defect in the right upper lobe.  This also matches airspace disease on the prior chest x-ray in the right upper lobe.  Given its upper lobe location, study remains low probability for pulmonary embolus. No VQ mismatches.  IMPRESSION: Triple matched defect in the right upper lobe.  No areas of VQ mismatch.  Study is low probability for pulmonary embolus.  Original Report Authenticated By: Cyndie Chime, M.D.    Medications: I have reviewed the patient's current medications.  Assessment: Principal Problem:  *Systolic congestive heart failure with reduced left ventricular function, NYHA class 2 Active Problems:  History of CVA (cerebrovascular accident) without residual deficits  Severe mitral regurgitation  CAD (coronary artery disease)  HCAP (healthcare-associated pneumonia)  Chronic respiratory failure  COPD (chronic obstructive pulmonary disease)  Acute respiratory failure  ARF (acute renal failure)  Elevated LFTs  Hyperglycemia    1. Acute on chronic systolic congestive heart failure exacerbation/severe mitral valve regurgitation.. The patient is now on IV Lasix 3 times daily, spur on the lactone, lisinopril, and carvedilol. Despite the increase in Lasix, he is not diuresing sufficiently. His weight has not changed significantly. He is still symptomatic. I wonder if the patient needs ultrafiltration or a Lasix drip. This will be deferred to cardiology.  Coronary artery disease. Cardiac catheterization is planned in the near future. His cardiac enzymes during this admission have been essentially negative.  Elevated d-dimer. The VQ scan revealed low probability for PE. Full dose Lovenox was changed to prophylactic dosing.   Healthcare acquired  pneumonia superimposed on COPD. Continue Avelox, cefepime, and vancomycin. Avelox could be changed over to by mouth to decrease the fluids he is receiving with antibiotics.  Acute renal failure. The patient's overall renal function has improved compared to the previous hospitalization. However, over the past 24 hours, his creatinine has increased 1.3-1.7.  Elevated LFTs. This is likely secondary to hepatic congestion. He has no abdominal tenderness.  Hyperglycemia. This might be stress induced. Overall, his venous glucose has trended towards normal. We'll check a hemoglobin A1c.  Plan:  1. Negative for increasing Lasix versus Lasix drip versus ultrafiltration versus other to cardiology. 2. Will change Avelox to by mouth. 3. Will order hemoglobin A1c. 4. Will order a followup chest x-ray and pro BNP in the morning. 5. Monitor renal function closely on ACE inhibitor. 6. Give potassium times one day and then follow.   LOS: 2 days   Charles Savage 10/11/2011, 8:00 AM

## 2011-10-11 NOTE — Consult Note (Signed)
ANTIBIOTIC CONSULT NOTE   Pharmacy Consult for Vancomycin and Zosyn Indication: rule out pneumonia  Allergies  Allergen Reactions  . Metoprolol Shortness Of Breath and Nausea And Vomiting  . Morphine And Related Hives and Itching  . Other Other (See Comments)    Peaches cause hives and itching   . Peach Flavor Hives and Itching   Patient Measurements: Height: 5\' 8"  (172.7 cm) Weight: 190 lb 0.6 oz (86.2 kg) IBW/kg (Calculated) : 68.4   Vital Signs: Temp: 98.2 F (36.8 C) (04/17 0400) Temp src: Oral (04/17 0400) BP: 92/56 mmHg (04/17 0754) Pulse Rate: 90  (04/17 0754) Intake/Output from previous day: 04/16 0701 - 04/17 0700 In: 2205 [P.O.:1440; I.V.:3; IV Piggyback:762] Out: -  Intake/Output from this shift: Total I/O In: 243 [P.O.:240; I.V.:3] Out: 500 [Urine:500]  Labs:  [facility] 10/11/11 0543 10/10/11 0500 10/09/11 2011  WBC -- 7.2 13.2*  HGB -- 12.2* 13.3  PLT -- 252 271  LABCREA -- -- --  CREATININE 1.70* 1.33 1.49*   Estimated Creatinine Clearance: 51.8 ml/min (by C-G formula based on Cr of 1.7). No results found for this basename: VANCOTROUGH:2,VANCOPEAK:2,VANCORANDOM:2,GENTTROUGH:2,GENTPEAK:2,GENTRANDOM:2,TOBRATROUGH:2,TOBRAPEAK:2,TOBRARND:2,AMIKACINPEAK:2,AMIKACINTROU:2,AMIKACIN:2, in the last 72 hours   Microbiology: Recent Results (from the past 720 hour(s))  CULTURE, SPUTUM-ASSESSMENT     Status: Normal   Collection Time   09/11/11  6:36 PM      Component Value Range Status Comment   Specimen Description SPUTUM   Final    Special Requests NONE   Final    Sputum evaluation     Final    Value: MICROSCOPIC FINDINGS SUGGEST THAT THIS SPECIMEN IS NOT REPRESENTATIVE OF LOWER RESPIRATORY SECRETIONS. PLEASE RECOLLECT.     CALLED TO RN E.SMITH AT 1923 09/11/11 BY L.PITT   Report Status 09/11/2011 FINAL   Final   CULTURE, SPUTUM-ASSESSMENT     Status: Normal   Collection Time   09/14/11  1:38 AM      Component Value Range Status Comment   Specimen  Description SPUTUM   Final    Special Requests NONE   Final    Sputum evaluation     Final    Value: MICROSCOPIC FINDINGS SUGGEST THAT THIS SPECIMEN IS NOT REPRESENTATIVE OF LOWER RESPIRATORY SECRETIONS. PLEASE RECOLLECT.     CALLED TO A.BOEHLING,RN 0248 09/14/11 M.CAMPBELL   Report Status 09/14/2011 FINAL   Final   CULTURE, SPUTUM-ASSESSMENT     Status: Normal   Collection Time   09/14/11  9:24 PM      Component Value Range Status Comment   Specimen Description SPUTUM   Final    Special Requests Normal   Final    Sputum evaluation     Final    Value: THIS SPECIMEN IS ACCEPTABLE. RESPIRATORY CULTURE REPORT TO FOLLOW.   Report Status 09/14/2011 FINAL   Final   CULTURE, RESPIRATORY     Status: Normal   Collection Time   09/14/11  9:24 PM      Component Value Range Status Comment   Specimen Description SPUTUM   Final    Special Requests NONE   Final    Gram Stain     Final    Value: RARE WBC PRESENT,BOTH PMN AND MONONUCLEAR     RARE SQUAMOUS EPITHELIAL CELLS PRESENT     NO ORGANISMS SEEN   Culture NORMAL OROPHARYNGEAL FLORA   Final    Report Status 09/17/2011 FINAL   Final   MRSA PCR SCREENING     Status: Normal   Collection Time  10/10/11  2:26 AM      Component Value Range Status Comment   MRSA by PCR NEGATIVE  NEGATIVE  Final    Medical History: Past Medical History  Diagnosis Date  . Chronic pain     back pain  . Stroke   . Hyperlipemia   . Lumbar disc disease   . Carotid artery disease   . Myocardial infarct   . Pneumonia   . CHF (congestive heart failure)   . COPD (chronic obstructive pulmonary disease)     on 2L O2 at home since 08/2011  . Coronary artery disease   . Shortness of breath    Medications:  Scheduled:     . antiseptic oral rinse  15 mL Mouth Rinse BID  . aspirin EC  81 mg Oral Daily  . carvedilol  6.25 mg Oral BID WC  . ceFEPime (MAXIPIME) IV  1 g Intravenous Q12H  . clopidogrel  75 mg Oral Q breakfast  . docusate sodium  100 mg Oral BID  .  enoxaparin  40 mg Subcutaneous Q24H  . furosemide  60 mg Intravenous Daily  . levalbuterol  0.63 mg Nebulization Q6H  . lisinopril  2.5 mg Oral BID  . moxifloxacin  400 mg Intravenous QHS  . nicotine  14 mg Transdermal Daily  . pneumococcal 23 valent vaccine  0.5 mL Intramuscular Once  . potassium chloride  20 mEq Oral BID  . sodium chloride  3 mL Intravenous Q12H  . sodium chloride  3 mL Intravenous Q12H  . spironolactone  12.5 mg Oral Daily  . vancomycin  1,000 mg Intravenous Q12H  . DISCONTD: carvedilol  3.125 mg Oral BID WC  . DISCONTD: clopidogrel  75 mg Oral Q breakfast  . DISCONTD: enoxaparin  1 mg/kg Subcutaneous BID  . DISCONTD: furosemide  60 mg Intravenous BID  . DISCONTD: furosemide  60 mg Intravenous TID  . DISCONTD: pneumococcal 23 valent vaccine  0.5 mL Intramuscular Tomorrow-1000   Assessment: Renal fxn worsening, SCr rising AFebrile, WBC improved  Goal of Therapy:  Vancomycin trough level 15-20 mcg/ml  Plan: Cefepime 1gm iv q12hrs Vancomycin 1gm iv q12hrs Check trough this afternoon  Eldor Conaway A 10/11/2011,10:46 AM

## 2011-10-11 NOTE — Progress Notes (Signed)
Subjective: Feels somewhat better, less shortness of breath, no chest pain or palpitations.   Objective: Temp:  [97 F (36.1 C)-98.3 F (36.8 C)] 98.2 F (36.8 C) (04/17 0400) Pulse Rate:  [81-107] 90  (04/17 0754) Resp:  [10-29] 12  (04/17 0600) BP: (74-120)/(48-84) 92/56 mmHg (04/17 0754) SpO2:  [90 %-99 %] 99 % (04/17 0721) Weight:  [190 lb 0.6 oz (86.2 kg)] 190 lb 0.6 oz (86.2 kg) (04/17 0500)  Last 24 hours: Out greater than by 260 cc.  Telemetry - Sinus rhythm.  Exam -   General - NAD.  Lungs - Diminished breath sounds, scattered crackles at bases, no wheezing.  Cardiac - Regular rate and rhythm, soft apical systolic murmur. No S3.  Abdomen - NABS.  Extremities - No pitting.  Testing -   Lab Results  Component Value Date   WBC 7.2 10/10/2011   HGB 12.2* 10/10/2011   HCT 37.8* 10/10/2011   MCV 93.1 10/10/2011   PLT 252 10/10/2011    Lab Results  Component Value Date   CREATININE 1.70* 10/11/2011   BUN 45* 10/11/2011   NA 134* 10/11/2011   K 3.7 10/11/2011   CL 96 10/11/2011   CO2 27 10/11/2011    Lab Results  Component Value Date   CKTOTAL 58 10/10/2011   CKMB 4.0 10/10/2011   TROPONINI <0.30 10/10/2011    ECG - 4/15, sinus tachycardia with nonspecific ST-T changes.  VQ scan - 4/15, low probability for pulmonary embolus.  Echocardiogram -  3/26 Study Conclusions  - Left ventricle: The cavity size was mildly dilated. Systolic function was moderately reduced. The estimated ejection fraction was in the range of 35% to 40%. Diffuse hypokinesis. Severe hypokinesis of the inferior myocardium. - Aortic valve: Mild regurgitation. - Mitral valve: Moderate to severe regurgitation. - Left atrium: The atrium was moderately dilated. - Pulmonary arteries: Systolic pressure was mildly increased.   Current Medications    . antiseptic oral rinse  15 mL Mouth Rinse BID  . aspirin EC  81 mg Oral Daily  . carvedilol  6.25 mg Oral BID WC  . ceFEPime  (MAXIPIME) IV  1 g Intravenous Q12H  . clopidogrel  75 mg Oral Q breakfast  . docusate sodium  100 mg Oral BID  . enoxaparin  40 mg Subcutaneous Q24H  . furosemide  60 mg Intravenous TID  . levalbuterol  0.63 mg Nebulization Q6H  . lisinopril  2.5 mg Oral BID  . moxifloxacin  400 mg Intravenous QHS  . nicotine  14 mg Transdermal Daily  . pneumococcal 23 valent vaccine  0.5 mL Intramuscular Once  . potassium chloride  20 mEq Oral BID  . sodium chloride  3 mL Intravenous Q12H  . sodium chloride  3 mL Intravenous Q12H  . spironolactone  12.5 mg Oral Daily  . vancomycin  1,000 mg Intravenous Q12H  . DISCONTD: carvedilol  3.125 mg Oral BID WC  . DISCONTD: clopidogrel  75 mg Oral Q breakfast  . DISCONTD: enoxaparin  1 mg/kg Subcutaneous BID  . DISCONTD: furosemide  60 mg Intravenous BID  . DISCONTD: pneumococcal 23 valent vaccine  0.5 mL Intramuscular Tomorrow-1000    Assessment:  1. Acute on chronic systolic heart failure, LVEF 35-40%. Suspect that this is also complicated by mitral regurgitation, graded as moderately severe.  2. Pneumonia, on broad-spectrum antibiotics. Cough somewhat better.  3. Coronary artery disease based on coronary artery calcifications by chest CT in the past. Cardiac catheterization is being considered by  Dr. Donnie Aho ultimately, however deferred until renal function has improved to baseline. He is being managed medically at this point. Cardiac markers argue against an acute coronary syndrome.  4. 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 Creatinine 1.3-1.7 with diuresis.  Plan:  Current medications include aspirin, Coreg, Plavix, Lovenox, lisinopril, Lasix, potassium, Aldactone. Would reduce Lasix dosing to 60 mg once daily. He is likely to continue with symptoms of shortness of breath in light of his mitral regurgitation, and need to be careful about volume contraction with progressive renal insufficiency  with aggressive diuresis. Continue antibiotics per primary team for management of pneumonia. Hopefully can stabilize with medical therapy adjustments, and he can continue outpatient cardiac workup with Dr. Donnie Aho.   Jonelle Sidle, M.D., F.A.C.C.

## 2011-10-12 ENCOUNTER — Inpatient Hospital Stay (HOSPITAL_COMMUNITY): Payer: Medicaid Other

## 2011-10-12 LAB — CBC
MCH: 30.6 pg (ref 26.0–34.0)
MCHC: 32.8 g/dL (ref 30.0–36.0)
MCV: 93.2 fL (ref 78.0–100.0)
Platelets: 240 10*3/uL (ref 150–400)
RBC: 4.09 MIL/uL — ABNORMAL LOW (ref 4.22–5.81)
RDW: 15.8 % — ABNORMAL HIGH (ref 11.5–15.5)

## 2011-10-12 LAB — BASIC METABOLIC PANEL
BUN: 51 mg/dL — ABNORMAL HIGH (ref 6–23)
Calcium: 8.5 mg/dL (ref 8.4–10.5)
Creatinine, Ser: 1.84 mg/dL — ABNORMAL HIGH (ref 0.50–1.35)
GFR calc non Af Amer: 39 mL/min — ABNORMAL LOW (ref >90)
Glucose, Bld: 111 mg/dL — ABNORMAL HIGH (ref 70–99)

## 2011-10-12 LAB — HEMOGLOBIN A1C: Hgb A1c MFr Bld: 6.5 % — ABNORMAL HIGH (ref <5.7)

## 2011-10-12 MED ORDER — FUROSEMIDE 10 MG/ML IJ SOLN: 20.0000 mg | Freq: Four times a day (QID) | INTRAMUSCULAR | Status: DC

## 2011-10-12 MED ORDER — CARVEDILOL 3.125 MG PO TABS
3.1250 mg | ORAL_TABLET | Freq: Two times a day (BID) | ORAL | Status: DC
  Administered 2011-10-12: 3.125 mg via ORAL
  Filled 2011-10-12: qty 1

## 2011-10-12 MED ORDER — VANCOMYCIN HCL 1000 MG IV SOLR: 1250.0000 mg | INTRAVENOUS | Status: DC

## 2011-10-12 MED ORDER — FUROSEMIDE 10 MG/ML IJ SOLN
40.0000 mg | Freq: Every day | INTRAMUSCULAR | Status: DC
  Administered 2011-10-13 – 2011-10-15 (×3): 40 mg via INTRAVENOUS
  Filled 2011-10-12 (×3): qty 4

## 2011-10-12 MED ORDER — LISINOPRIL 5 MG PO TABS
2.5000 mg | ORAL_TABLET | Freq: Every day | ORAL | Status: DC
  Administered 2011-10-12 – 2011-10-19 (×7): 2.5 mg via ORAL
  Filled 2011-10-12 (×2): qty 1
  Filled 2011-10-12 (×3): qty 0.5
  Filled 2011-10-12: qty 1
  Filled 2011-10-12 (×4): qty 0.5

## 2011-10-12 MED ORDER — FUROSEMIDE 40 MG PO TABS: 40.0000 mg | ORAL_TABLET | Freq: Every day | ORAL | Status: DC

## 2011-10-12 NOTE — Progress Notes (Signed)
Physical Therapy Treatment Patient Details Name: Charles Savage MRN: [id number] DOB: [birth year] Today's Date: 10/12/2011  WUJW:119-147/ 1 TE 1 GT  PT Assessment/Plan  PT - Assessment/Plan Comments on Treatment Session: Patient was able to complete two sessions of gait training without AD 180' + 90'; 6L of portable O2 remaining at 96-100%. No c/o of signs of dyspenea or SOB observed PT Goals     PT Treatment Precautions/Restrictions  Precautions Precautions: None Restrictions Weight Bearing Restrictions: No Mobility (including Balance) Bed Mobility Supine to Sit: 7: Independent Sitting - Scoot to Edge of Bed: 7: Independent Sit to Supine: 7: Independent Transfers Sit to Stand: 7: Independent Stand to Sit: 7: Independent Ambulation/Gait Ambulation/Gait: Yes Ambulation/Gait Assistance: 5: Supervision Ambulation/Gait Assistance Details (indicate cue type and reason): portable O2 6L;verbal cues to slow gait velocity for energy conservation Ambulation Distance (Feet): 270 Feet (180' + 90' ) Assistive device: None    Exercise  General Exercises - Upper Extremity Shoulder Flexion: Both;10 reps Shoulder Horizontal ABduction: Both;10 reps Shoulder Horizontal ADduction: 10 reps;Both General Exercises - Lower Extremity Long Arc Quad: 10 reps;Both Hip Flexion/Marching: Both (1 min) Mini-Sqauts: 10 reps End of Session PT - End of Session Equipment Utilized During Treatment: Gait belt Activity Tolerance: Patient tolerated treatment well Patient left: in bed;with call bell in reach;with family/visitor present (nursing aware no alarm set) General Behavior During Session: Charles Savage for tasks performed Cognition: Charles Savage for tasks performed  Charles Savage 10/12/2011, 9:55 AM

## 2011-10-12 NOTE — Consult Note (Signed)
ANTIBIOTIC CONSULT NOTE   Pharmacy Consult for Vancomycin and Zosyn Indication: rule out pneumonia  Allergies  Allergen Reactions  . Metoprolol Shortness Of Breath and Nausea And Vomiting  . Morphine And Related Hives and Itching  . Other Other (See Comments)    Peaches cause hives and itching   . Peach Flavor Hives and Itching   Patient Measurements: Height: 5\' 8"  (172.7 cm) Weight: 189 lb 2.5 oz (85.8 kg) IBW/kg (Calculated) : 68.4   Vital Signs: Temp: 97.8 F (36.6 C) (04/18 0400) Temp src: Axillary (04/18 0400) BP: 86/50 mmHg (04/18 0700) Pulse Rate: 90  (04/18 0700) Intake/Output from previous day: 04/17 0701 - 04/18 0700 In: 1429 [P.O.:870; I.V.:3; IV Piggyback:556] Out: 1350 [Urine:1350] Intake/Output from this shift:    Labs:  Basename 10/12/11 0537 10/11/11 0543 10/10/11 0500 10/09/11 2011  WBC 13.4* -- 7.2 13.2*  HGB 12.5* -- 12.2* 13.3  PLT 240 -- 252 271  LABCREA -- -- -- --  CREATININE 1.84* 1.70* 1.33 --   Estimated Creatinine Clearance: 47.8 ml/min (by C-G formula based on Cr of 1.84).  Basename 10/11/11 1434  VANCOTROUGH 31.6*  VANCOPEAK --  Drue Dun --  GENTTROUGH --  GENTPEAK --  GENTRANDOM --  TOBRATROUGH --  TOBRAPEAK --  TOBRARND --  AMIKACINPEAK --  AMIKACINTROU --  AMIKACIN --    Microbiology: Recent Results (from the past 720 hour(s))  CULTURE, SPUTUM-ASSESSMENT     Status: Normal   Collection Time   09/14/11  1:38 AM      Component Value Range Status Comment   Specimen Description SPUTUM   Final    Special Requests NONE   Final    Sputum evaluation     Final    Value: MICROSCOPIC FINDINGS SUGGEST THAT THIS SPECIMEN IS NOT REPRESENTATIVE OF LOWER RESPIRATORY SECRETIONS. PLEASE RECOLLECT.     CALLED TO A.BOEHLING,RN 0248 09/14/11 M.CAMPBELL   Report Status 09/14/2011 FINAL   Final   CULTURE, SPUTUM-ASSESSMENT     Status: Normal   Collection Time   09/14/11  9:24 PM      Component Value Range Status Comment   Specimen  Description SPUTUM   Final    Special Requests Normal   Final    Sputum evaluation     Final    Value: THIS SPECIMEN IS ACCEPTABLE. RESPIRATORY CULTURE REPORT TO FOLLOW.   Report Status 09/14/2011 FINAL   Final   CULTURE, RESPIRATORY     Status: Normal   Collection Time   09/14/11  9:24 PM      Component Value Range Status Comment   Specimen Description SPUTUM   Final    Special Requests NONE   Final    Gram Stain     Final    Value: RARE WBC PRESENT,BOTH PMN AND MONONUCLEAR     RARE SQUAMOUS EPITHELIAL CELLS PRESENT     NO ORGANISMS SEEN   Culture NORMAL OROPHARYNGEAL FLORA   Final    Report Status 09/17/2011 FINAL   Final   URINE CULTURE     Status: Normal   Collection Time   10/09/11 10:22 PM      Component Value Range Status Comment   Specimen Description URINE, CLEAN CATCH   Final    Special Requests NONE   Final    Culture  Setup Time [id number]   Final    Colony Count NO GROWTH   Final    Culture NO GROWTH   Final    Report Status 10/11/2011 FINAL  Final   MRSA PCR SCREENING     Status: Normal   Collection Time   10/10/11  2:26 AM      Component Value Range Status Comment   MRSA by PCR NEGATIVE  NEGATIVE  Final    Medical History: Past Medical History  Diagnosis Date  . Chronic pain     back pain  . Stroke   . Hyperlipemia   . Lumbar disc disease   . Carotid artery disease   . Myocardial infarct   . Pneumonia   . CHF (congestive heart failure)   . COPD (chronic obstructive pulmonary disease)     on 2L O2 at home since 08/2011  . Coronary artery disease   . Shortness of breath    Medications:  Scheduled:     . antiseptic oral rinse  15 mL Mouth Rinse BID  . aspirin EC  81 mg Oral Daily  . carvedilol  6.25 mg Oral BID WC  . ceFEPime (MAXIPIME) IV  1 g Intravenous Q12H  . clopidogrel  75 mg Oral Q breakfast  . docusate sodium  100 mg Oral BID  . enoxaparin  40 mg Subcutaneous Q24H  . furosemide  60 mg Intravenous Daily  . levalbuterol  0.63 mg  Nebulization Q6H  . lisinopril  2.5 mg Oral BID  . moxifloxacin  400 mg Intravenous QHS  . nicotine  14 mg Transdermal Daily  . potassium chloride  20 mEq Oral BID  . sodium chloride  3 mL Intravenous Q12H  . sodium chloride  3 mL Intravenous Q12H  . spironolactone  12.5 mg Oral Daily  . DISCONTD: furosemide  60 mg Intravenous TID  . DISCONTD: vancomycin  1,000 mg Intravenous Q12H   Assessment: Renal fxn worsening, SCr rising Vanco trough level above goal  Goal of Therapy:  Vancomycin trough level 15-20 mcg/ml  Plan: Cefepime 1gm iv q12hrs Change Vancomycin to 1250mg  q24hrs Monitor renal fxn daily in setting of changing renal fxn  Valrie Hart A 10/12/2011,8:32 AM

## 2011-10-12 NOTE — Progress Notes (Addendum)
SUBJECTIVE:Breathing much better but feels weaker, more fatigued. He was able to walk in the unit today without significant shortness of breath or dizziness.  Principal Problem:  *Systolic congestive heart failure with reduced left ventricular function, NYHA class 2 Active Problems:  History of CVA (cerebrovascular accident) without residual deficits  Severe mitral regurgitation  CAD (coronary artery disease)  HCAP (healthcare-associated pneumonia)  Chronic respiratory failure  COPD (chronic obstructive pulmonary disease)  Acute respiratory failure  ARF (acute renal failure)  Elevated LFTs  Hyperglycemia  LABS: Basic Metabolic Panel:  Basename 10/12/11 0537 10/11/11 0543  NA 132* 134*  K 4.1 3.7  CL 95* 96  CO2 26 27  GLUCOSE 111* 128*  BUN 51* 45*  CREATININE 1.84* 1.70*  CALCIUM 8.5 8.4  MG -- --  PHOS -- --   Liver Function Tests:  Basename 10/10/11 0500 10/09/11 2011  AST 47* 58*  ALT 52 56*  ALKPHOS 102 111  BILITOT 0.8 1.0  PROT 7.4 7.8  ALBUMIN 3.5 3.9   No results found for this basename: LIPASE:2,AMYLASE:2 in the last 72 hours CBC:  Basename 10/12/11 0537 10/10/11 0500 10/09/11 2011  WBC 13.4* 7.2 --  NEUTROABS -- -- 7.3  HGB 12.5* 12.2* --  HCT 38.1* 37.8* --  MCV 93.2 93.1 --  PLT 240 252 --   Cardiac Enzymes:  Basename 10/10/11 1743 10/10/11 0941 10/10/11 0205  CKTOTAL 58 55 62  CKMB 4.0 4.1* 4.3*  CKMBINDEX -- -- --  TROPONINI <0.30 <0.30 <0.30   BNP: No components found with this basename: POCBNP:3 D-Dimer:  Basename 10/09/11 2011  DDIMER 2.42*   Hemoglobin A1C:  Basename 10/11/11 0808  HGBA1C 6.5*   Thyroid Function Tests:  Basename 10/10/11 0230  TSH 2.646  T4TOTAL --  T3FREE --  THYROIDAB --    RADIOLOGY: Dg Chest 2 View  10/12/2011  *RADIOLOGY REPORT*  Clinical Data: CHF, pneumonia.  CHEST - 2 VIEW  Comparison: 10/09/2011.  Findings: Trachea is midline.  Heart size stable.  Patchy bilateral air space disease  persists.  There are small bilateral pleural effusions, decreased from 10/09/2011.  IMPRESSION:  1.  Patchy bilateral air space disease, indicative of pneumonia, stable. 2.  Small bilateral pleural effusions, decreased in size from 10/09/2011.  Original Report Authenticated By: Reyes Ivan, M.D.   Nm Pulmonary Per & Vent  10/10/2011    Study is low probability for pulmonary embolus.  Original Report Authenticated By: Cyndie Chime, M.D.   ECHOCARDIOGRAM: 09/19/2011 Left ventricle: The cavity size was mildly dilated. Systolic function was moderately reduced. The estimated ejection fraction was in the range of 35% to 40%. Diffuse hypokinesis. Severe hypokinesis of the inferior myocardium. - Aortic valve: Mild regurgitation. - Mitral valve: Moderate to severe regurgitation. - Left atrium: The atrium was moderately dilated. - Pulmonary arteries: Systolic pressure was mildly increased.  PHYSICAL EXAM BP 86/50  Pulse 90  Temp(Src) 97.8 F (36.6 C) (Axillary)  Resp 23  Ht 5\' 8"  (1.727 m)  Wt 189 lb 2.5 oz (85.8 kg)  BMI 28.76 kg/m2  SpO2 97% General: Well developed, well nourished, in no acute distress Head: Eyes PERRLA, No xanthomas.   Normal cephalic and atramatic  Lungs: Clear bilaterally to auscultation and percussion. Heart: HRRR S1 S2, No MRG .  Pulses are 2+ & equal.            No carotid bruit. No JVD.  No abdominal bruits. No femoral bruits. Abdomen: Bowel sounds are positive, abdomen soft and  non-tender without masses or                  Hernia's noted. Msk:  Back normal, normal gait. Normal strength and tone for age. Extremities: No clubbing, cyanosis or edema.  DP +1 Neuro: Alert and oriented X 3. Psych:  Good affect, responds appropriately  TELEMETRY: Reviewed telemetry pt in:NSR slightly tachycardic   ASSESSMENT AND PLAN:  1. Acute on chronic systolic CHF in the setting of severe systolic dysfunction with an EF of 35-40%.:  Creatine level now increasing.  Will DC IV Lasix 60 mg daily and changed to by mouth 40 mg daily. This will be given starting in the a.m. holding dose today. We will decrease Coreg to 3.125 milligrams twice a day. Blood pressure is been soft over the last 24 hours believe this is may be related to mild dehydration and higher doses of antihypertensives. He is being moved to the telemetry floor and will be up ambulating. We will monitor his blood pressure response once he is more active. We will follow kidney status to evaluate for improvement off of the excessive diuresis. We will continue starting tomorrow, as well as kidney function has improved.   2. Mitral Regurgitation: On auscultation he murmur is soft. Heart rate control will be necessary. Heart rate is up today, which I believe, is secondary to mild dehydration and hypotension. We will follow  3. CAD: Abnormal CT scan during hospitalization at [facility] in March of 2012-coronary artery calcifications. Plan for cardiac catheterization when he is more stable from kidney function, and breathing status  Bettey Mare. Lyman Bishop NP [facility] 10/12/2011, 9:56 AM   Cardiology Attending Patient interviewed and examined. Discussed with Joni Reining, NP.  Above note annotated and modified based upon my findings.  Patient is not responding well to medical therapy with reversal of the previous improvement in his renal function, failure to diurese significantly, poor tolerance for medication due to relative hypotension and generally poor performance status. It appears that we may not be able to further defer catheterization nor achieve greater stability with medical therapy, at least not outside of an ICU. Carvedilol will be discontinued. The dose of lisinopril has been minimized. The dose of furosemide has been decreased.  If no improvement occurs within the next 24 hours, we will consider transfer to the CCU at [facility].  Amberley Bing, MD 10/12/2011, 6:46 PM

## 2011-10-12 NOTE — Progress Notes (Signed)
Report called to Quita Skye, RN.  Pt stable and ready for transfer to 323, no acute distress noted.

## 2011-10-12 NOTE — Progress Notes (Signed)
Subjective: The patient still feels short of breath, but less than yesterday. He says that he has not been urinating a lot.  Objective: Vital signs in last 24 hours: Filed Vitals:   10/12/11 0500 10/12/11 0600 10/12/11 0700 10/12/11 0720  BP: 74/50 94/66 86/50    Pulse: 84 89 90   Temp:      TempSrc:      Resp: 15 23 23    Height:      Weight: 85.8 kg (189 lb 2.5 oz)     SpO2: 92% 96% 99% 97%    Intake/Output Summary (Last 24 hours) at 10/12/11 0946 Last data filed at 10/12/11 0600  Gross per 24 hour  Intake    829 ml  Output    850 ml  Net    -21 ml    Weight change: -0.4 kg (-14.1 oz)  Physical exam: Lungs: Bibasilar crackles. Breathing appears to be non-labored. Heart: S1, S2, with questionable S3 gallop and 2/6 systolic murmur.. Abdomen: Positive bowel sounds, soft, nontender, nondistended. Extremities: Trace pedal edema.  Lab Results: Basic Metabolic Panel:  Basename 10/12/11 0537 10/11/11 0543  NA 132* 134*  K 4.1 3.7  CL 95* 96  CO2 26 27  GLUCOSE 111* 128*  BUN 51* 45*  CREATININE 1.84* 1.70*  CALCIUM 8.5 8.4  MG -- --  PHOS -- --   Liver Function Tests:  Basename 10/10/11 0500 10/09/11 2011  AST 47* 58*  ALT 52 56*  ALKPHOS 102 111  BILITOT 0.8 1.0  PROT 7.4 7.8  ALBUMIN 3.5 3.9   No results found for this basename: LIPASE:2,AMYLASE:2 in the last 72 hours No results found for this basename: AMMONIA:2 in the last 72 hours CBC:  Basename 10/12/11 0537 10/10/11 0500 10/09/11 2011  WBC 13.4* 7.2 --  NEUTROABS -- -- 7.3  HGB 12.5* 12.2* --  HCT 38.1* 37.8* --  MCV 93.2 93.1 --  PLT 240 252 --   Cardiac Enzymes:  Basename 10/10/11 1743 10/10/11 0941 10/10/11 0205  CKTOTAL 58 55 62  CKMB 4.0 4.1* 4.3*  CKMBINDEX -- -- --  TROPONINI <0.30 <0.30 <0.30   BNP:  Basename 10/12/11 0537 10/09/11 2011  PROBNP 6773.0* 14487.0*   D-Dimer:  Alvira Philips 10/09/11 2011  DDIMER 2.42*   CBG: No results found for this basename: GLUCAP:6 in the last  72 hours Hemoglobin A1C:  Basename 10/11/11 0808  HGBA1C 6.5*   Fasting Lipid Panel: No results found for this basename: CHOL,HDL,LDLCALC,TRIG,CHOLHDL,LDLDIRECT in the last 72 hours Thyroid Function Tests:  Basename 10/10/11 0230  TSH 2.646  T4TOTAL --  FREET4 --  T3FREE --  THYROIDAB --   Anemia Panel: No results found for this basename: VITAMINB12,FOLATE,FERRITIN,TIBC,IRON,RETICCTPCT in the last 72 hours Coagulation:  Basename 10/10/11 0500  LABPROT 18.2*  INR 1.48   Urine Drug Screen: Drugs of Abuse  No results found for this basename: labopia,  cocainscrnur,  labbenz,  amphetmu,  thcu,  labbarb    Alcohol Level: No results found for this basename: ETH:2 in the last 72 hours Urinalysis:  Basename 10/09/11 2222  COLORURINE YELLOW  LABSPEC >1.030*  PHURINE 5.5  GLUCOSEU NEGATIVE  HGBUR TRACE*  BILIRUBINUR NEGATIVE  KETONESUR NEGATIVE  PROTEINUR TRACE*  UROBILINOGEN 0.2  NITRITE NEGATIVE  LEUKOCYTESUR NEGATIVE   Misc. Labs:   Micro: Recent Results (from the past 240 hour(s))  URINE CULTURE     Status: Normal   Collection Time   10/09/11 10:22 PM      Component Value Range Status  Comment   Specimen Description URINE, CLEAN CATCH   Final    Special Requests NONE   Final    Culture  Setup Time [id number]   Final    Colony Count NO GROWTH   Final    Culture NO GROWTH   Final    Report Status 10/11/2011 FINAL   Final   MRSA PCR SCREENING     Status: Normal   Collection Time   10/10/11  2:26 AM      Component Value Range Status Comment   MRSA by PCR NEGATIVE  NEGATIVE  Final     Studies/Results: Dg Chest 2 View  10/12/2011  *RADIOLOGY REPORT*  Clinical Data: CHF, pneumonia.  CHEST - 2 VIEW  Comparison: 10/09/2011.  Findings: Trachea is midline.  Heart size stable.  Patchy bilateral air space disease persists.  There are small bilateral pleural effusions, decreased from 10/09/2011.  IMPRESSION:  1.  Patchy bilateral air space disease, indicative of  pneumonia, stable. 2.  Small bilateral pleural effusions, decreased in size from 10/09/2011.  Original Report Authenticated By: Reyes Ivan, M.D.   Nm Pulmonary Per & Vent  10/10/2011  *RADIOLOGY REPORT*  Clinical Data: Shortness of breath.  NM PULMONARY VENTILATION AND PERFUSION SCAN  Radiopharmaceutical: 2.8MILLI CURIE MAA TECHNETIUM TO 38M ALBUMIN AGGREGATED, CURIE xenon xe 133 gas 10 milli Curie XENON XE 133 GAS  Comparison: Chest x-ray 10/09/2011  Findings: Slight decreased generalized perfusion and ventilation in the right lung.  More focal matched ventilation and perfusion defect in the right upper lobe.  This also matches airspace disease on the prior chest x-ray in the right upper lobe.  Given its upper lobe location, study remains low probability for pulmonary embolus. No VQ mismatches.  IMPRESSION: Triple matched defect in the right upper lobe.  No areas of VQ mismatch.  Study is low probability for pulmonary embolus.  Original Report Authenticated By: Cyndie Chime, M.D.    Medications: I have reviewed the patient's current medications.  Assessment: Principal Problem:  *Systolic congestive heart failure with reduced left ventricular function, NYHA class 2 Active Problems:  History of CVA (cerebrovascular accident) without residual deficits  Severe mitral regurgitation  CAD (coronary artery disease)  HCAP (healthcare-associated pneumonia)  Chronic respiratory failure  COPD (chronic obstructive pulmonary disease)  Acute respiratory failure  ARF (acute renal failure)  Elevated LFTs  Hyperglycemia    1. Acute on chronic systolic congestive heart failure exacerbation/severe mitral valve regurgitation.. His pro BNP has decreased by more than half. His followup chest x-ray reveals air space opacities consistent with pneumonia. He still does not appear to be diuresing a whole lot. Increasing the Lasix could be problematic given that his creatinine is increasing.  Low  blood pressures. The patient became a little dizzy yesterday and therefore, several cardiac medications were withheld.  Coronary artery disease. Cardiac catheterization is planned in the near future. His cardiac enzymes during this admission have been essentially negative.  Elevated d-dimer. The VQ scan revealed low probability for PE. Full dose Lovenox was changed to prophylactic dosing.   Healthcare acquired pneumonia superimposed on COPD. Continue Avelox, cefepime, and vancomycin. We'll discontinue his vancomycin in light of an elevated trough level and worsening renal function.  Acute renal failure. The patient's overall renal function has improved compared to the previous hospitalization. However, his creatinine continues to increase a little each day. He is on an ACE inhibitor and this should be watched closely. Given his low-normal blood pressures, I will decrease  the lisinopril to once daily and confer further with cardiology.  Elevated LFTs. This is likely secondary to hepatic congestion. He has no abdominal tenderness.  Hyperglycemia. This might be stress induced. Overall, his venous glucose has trended towards normal. Hemoglobin A1c is pending.  Plan:  1. We'll discontinue vancomycin in light of the elevated trough level and worsening renal function. We'll continue cefepime and Avelox for treatment of healthcare acquired pneumonia. 2. We'll decrease lisinopril to once daily. Will defer further modifications in his cardiac medications to cardiology. 3. Transferred to telemetry bed. 4. Physical therapy consultation. 5. We'll continue to monitor his renal function and serum potassium as golf appear to be trending upward.    LOS: 3 days   Lelah Rennaker 10/12/2011, 9:46 AM

## 2011-10-13 LAB — CBC
MCV: 94.5 fL (ref 78.0–100.0)
Platelets: 257 10*3/uL (ref 150–400)
RDW: 16.2 % — ABNORMAL HIGH (ref 11.5–15.5)
WBC: 11 10*3/uL — ABNORMAL HIGH (ref 4.0–10.5)

## 2011-10-13 LAB — BASIC METABOLIC PANEL
CO2: 30 mEq/L (ref 19–32)
Calcium: 8.8 mg/dL (ref 8.4–10.5)
Creatinine, Ser: 1.42 mg/dL — ABNORMAL HIGH (ref 0.50–1.35)

## 2011-10-13 MED ORDER — MOXIFLOXACIN HCL 400 MG PO TABS
400.0000 mg | ORAL_TABLET | Freq: Every day | ORAL | Status: DC
  Administered 2011-10-13 – 2011-10-18 (×6): 400 mg via ORAL
  Filled 2011-10-13 (×8): qty 1

## 2011-10-13 NOTE — Progress Notes (Addendum)
SUBJECTIVE:Malaise and fatigue, but no major complaints.  No chest discomfort, orthopnea or PND.  Moving slowly around room without difficulty.  Principal Problem:  *Systolic congestive heart failure with reduced left ventricular function, NYHA class 2 Active Problems:  History of CVA (cerebrovascular accident) without residual deficits  Severe mitral regurgitation  CAD (coronary artery disease)  HCAP (healthcare-associated pneumonia)  Chronic respiratory failure  COPD (chronic obstructive pulmonary disease)  Acute respiratory failure  ARF (acute renal failure)  Elevated LFTs  Hyperglycemia  LABS: Basic Metabolic Panel:  Basename 10/13/11 0532 10/12/11 0537  NA 136 132*  K 3.8 4.1  CL 98 95*  CO2 30 26  GLUCOSE 136* 111*  BUN 35* 51*  CREATININE 1.42* 1.84*  CALCIUM 8.8 8.5  MG -- --  PHOS -- --   Liver Function Tests: CBC:  Basename 10/13/11 0532 10/12/11 0537  WBC 11.0* 13.4*  NEUTROABS -- --  HGB 12.3* 12.5*  HCT 38.0* 38.1*  MCV 94.5 93.2  PLT 257 240   Cardiac Enzymes:  Basename 10/10/11 1743 10/10/11 0941  CKTOTAL 58 55  CKMB 4.0 4.1*  CKMBINDEX -- --  TROPONINI <0.30 <0.30   Hemoglobin A1C:  Basename 10/11/11 0808  HGBA1C 6.5*    RADIOLOGY: Dg Chest 2 View  10/12/2011  *RADIOLOGY REPORT*  Clinical Data: CHF, pneumonia.  CHEST - 2 VIEW   IMPRESSION:  1.  Patchy bilateral air space disease, indicative of pneumonia, stable. 2.  Small bilateral pleural effusions, decreased in size from 10/09/2011.  Original Report Authenticated By: Reyes Ivan, M.D.   PHYSICAL EXAM BP 100/63  Pulse 97  Temp(Src) 97.6 F (36.4 C) (Oral)  Resp 20  Ht 5\' 8"  (1.727 m)  Wt 187 lb 2.7 oz (84.9 kg)  BMI 28.46 kg/m2  SpO2 98%WT Loss 3 lbs General: Well developed, well nourished, in no acute distress Head: Eyes PERRLA, No xanthomas.   Normal cephalic and atramatic  Lungs: Clear bilaterally to auscultation and percussion. Heart: HRRR S1 S2, tachycardia, soft  murmur at the apex.   .  Pulses are 2+ & equal.            No carotid bruit. No JVD.  No abdominal bruits. No femoral bruits. Abdomen: Bowel sounds are positive, abdomen soft and non-tender without masses or                  Hernia's noted. Msk:  Back normal, normal gait. Normal strength and tone for age. Extremities: No clubbing, cyanosis or edema.  DP +1 Neuro: Alert and oriented X 3. Psych:  Good affect, responds appropriately  I&O: neg. 750 cc; weight:decreased 1 or 3 Kg depending upon which value of 2 obtained is correct.  TELEMETRY: Reviewed telemetry; sinus rhythm with rates in the 90's at rest.  ASSESSMENT AND PLAN:  1. Acute on chronic systolic CHF with EF of 35-40%: BP remains on the low side despite decrease in medical therapy for CHF. Creatinine has improved somewhat to 1.4 to from 1.84 yesterday.  He remains mildly volume overloaded however, his breathing status has improved. Review of Dr. Marvel Plan  note discusses need to transfer for cardiac catheterization and further cardiac evaluation.  We will plan to transfer  sometime today for continued cardiac management, treatment of CHF.  Would like to have him seen by Dr. Teressa Lower, This has been discussed with primary care physician, Dr. Elliot Cousin.  I have spoken with Dr. Gala Romney and Dr. Donnie Aho, his primary cardiologist who is happy to have [facility]  accept this patient for continued management. He will be in touch with Dr. Gala Romney   2. CAD: Patient had abnormal CT scan revealing coronary artery calcifications. In March of 2012. He will need coronary angiography.   3. ?Pneumonia:  Continue antibiotics as prescribed for full 7 day course.   Bettey Mare. Lyman Bishop NP [facility] 10/13/2011, 8:56 AM  Cardiology Attending Patient interviewed and examined. Discussed with Joni Reining, NP.  Above note annotated and modified based upon my findings.  With near normal renal function and absence of symptoms at rest, he is an  acceptable candidate for cardiac cath.  His name has been submitted to the cath lab, and Dr. Gala Romney or Dr. Donnie Aho will supervise the procedure.  Swink Bing, MD 10/13/2011, 8:35 PM

## 2011-10-13 NOTE — Progress Notes (Signed)
Patient transferred to [facility] ,report given to RN carelink.Vital signs stable.No c/o pain or discomfort noted.Family at bedside.

## 2011-10-14 DIAGNOSIS — I5023 Acute on chronic systolic (congestive) heart failure: Principal | ICD-10-CM

## 2011-10-14 DIAGNOSIS — J9600 Acute respiratory failure, unspecified whether with hypoxia or hypercapnia: Secondary | ICD-10-CM

## 2011-10-14 MED ORDER — MILRINONE IN DEXTROSE 200-5 MCG/ML-% IV SOLN
0.2500 ug/kg/min | INTRAVENOUS | Status: DC
  Administered 2011-10-14 – 2011-10-20 (×7): 0.25 ug/kg/min via INTRAVENOUS
  Filled 2011-10-14 (×9): qty 100

## 2011-10-14 MED ORDER — LEVALBUTEROL HCL 0.63 MG/3ML IN NEBU
0.6300 mg | INHALATION_SOLUTION | Freq: Four times a day (QID) | RESPIRATORY_TRACT | Status: DC | PRN
  Administered 2011-10-14 – 2011-10-17 (×2): 0.63 mg via RESPIRATORY_TRACT
  Filled 2011-10-14: qty 3

## 2011-10-14 MED ORDER — DIGOXIN 125 MCG PO TABS
0.1250 mg | ORAL_TABLET | Freq: Every day | ORAL | Status: DC
  Administered 2011-10-14 – 2011-10-19 (×5): 0.125 mg via ORAL
  Filled 2011-10-14 (×7): qty 1

## 2011-10-14 NOTE — Progress Notes (Addendum)
Subjective:   Charles Savage is an 56 y.o. male followed by Dr. Donnie Aho. He has h/o of ETOH and tobacco use. He was apparently doing OK until he had a stroke in February 2013, treated by L carotid stent at [facility].   He was admitted to [facility] in 3/17-3/27 with respiratory failure associated with COPD and healthcare associated pneumonia Hospitalization complicated by as well as non-ST EMI (peak Troponin ~2), and acute renal failure with ATN. Echo at that time EF 40-45% with global HK and inferior akinesis and severe posterior MR. Managed medically due to renal failure.   Readmitted 4/4-4/8 with recurrent SOB and renal failure thought due to persistent PNA.  Echo repeated EF 35-40% with moderate to severe MR   Readmitted to [facility] 4/15  With systolic HF and hypotension. CE normal. Weight down about 12 pounds since admit. Cr initially 1.8 now 1.4. SBP 95-110. ECG (which I reviewed personally) ST 110 nonspecific TWA. CXR with stable patchy airspace disease suggestive of PNA. Effusions resolving. On avelox/cefepime  Still with mild dyspnea at rest. + orthopnea.   Intake/Output Summary (Last 24 hours) at 10/14/11 1246 Last data filed at 10/14/11 1108  Gross per 24 hour  Intake    983 ml  Output   1775 ml  Net   -792 ml    Current meds:    . antiseptic oral rinse  15 mL Mouth Rinse BID  . aspirin EC  81 mg Oral Daily  . ceFEPime (MAXIPIME) IV  1 g Intravenous Q12H  . clopidogrel  75 mg Oral Q breakfast  . docusate sodium  100 mg Oral BID  . enoxaparin  40 mg Subcutaneous Q24H  . furosemide  40 mg Intravenous Daily  . lisinopril  2.5 mg Oral Daily  . moxifloxacin  400 mg Oral q1800  . nicotine  14 mg Transdermal Daily  . sodium chloride  3 mL Intravenous Q12H  . sodium chloride  3 mL Intravenous Q12H  . DISCONTD: levalbuterol  0.63 mg Nebulization Q6H  . DISCONTD: moxifloxacin  400 mg Intravenous QHS   Infusions:     Objective:  Blood pressure 119/81, pulse 100,  temperature 97.4 F (36.3 C), temperature source Oral, resp. rate 20, height 5\' 8"  (1.727 m), weight 80.9 kg (178 lb 5.6 oz), SpO2 97.00%. Weight change: -2.8 kg (-6 lb 2.8 oz)  Physical Exam: General:  Mildly chronically ill appearing. No acute distress. HEENT: normal Neck: supple. JVP 7-8 . Carotids 1+ bialterally No lymphadenopathy or thryomegaly appreciated. Cor: PMI nonpalpable  Regular rate & rhythm. 3/6 MR Lungs: decreased throughout crackles at bases Abdomen: soft, nontender, nondistended. No hepatosplenomegaly. No bruits or masses. Good bowel sounds. Large ecchymosis LLQ Extremities: no cyanosis, clubbing, rash, edema Neuro: alert & orientedx3, cranial nerves grossly intact. moves all 4 extremities w/o difficulty. Affect pleasant  Telemetry:  ST 105  Lab Results: Basic Metabolic Panel:  Lab 10/13/11 8119 10/12/11 0537 10/11/11 0543 10/10/11 0500 10/09/11 2011  NA 136 132* 134* 133* 136  K 3.8 4.1 -- -- --  CL 98 95* 96 94* 97  CO2 30 26 27 23 26   GLUCOSE 136* 111* 128* 216* 119*  BUN 35* 51* 45* 32* 30*  CREATININE 1.42* 1.84* 1.70* 1.33 1.49*  CALCIUM 8.8 8.5 8.4 9.2 9.8  MG -- -- -- -- --  PHOS -- -- -- -- --   Liver Function Tests:  Lab 10/10/11 0500 10/09/11 2011  AST 47* 58*  ALT 52 56*  ALKPHOS 102 111  BILITOT 0.8 1.0  PROT 7.4 7.8  ALBUMIN 3.5 3.9   No results found for this basename: LIPASE:5,AMYLASE:5 in the last 168 hours No results found for this basename: AMMONIA:5 in the last 168 hours CBC:  Lab 10/13/11 0532 10/12/11 0537 10/10/11 0500 10/09/11 2011  WBC 11.0* 13.4* 7.2 13.2*  NEUTROABS -- -- -- 7.3  HGB 12.3* 12.5* 12.2* 13.3  HCT 38.0* 38.1* 37.8* 40.5  MCV 94.5 93.2 93.1 93.3  PLT 257 240 252 271   Cardiac Enzymes:  Lab 10/10/11 1743 10/10/11 0941 10/10/11 0205  CKTOTAL 58 55 62  CKMB 4.0 4.1* 4.3*  CKMBINDEX -- -- --  TROPONINI <0.30 <0.30 <0.30   BNP: No components found with this basename: POCBNP:5 CBG: No results found for  this basename: GLUCAP:5 in the last 168 hours Microbiology: Lab Results  Component Value Date   CULT NO GROWTH 10/09/2011   CULT NORMAL OROPHARYNGEAL FLORA 09/14/2011   CULT NO GROWTH 5 DAYS 09/10/2011   CULT NO GROWTH 5 DAYS 09/10/2011    Lab 10/09/11 2222  CULT NO GROWTH  SDES URINE, CLEAN CATCH    Imaging: No results found.   ASSESSMENT:  1. A/c systolic CHF 2. Ischemic CM EF 35-40% 3. Moderate to severe mitral regurgitation 4. A/C respiratory failure  5. COPD 6. CVA 2/13 - s/p L carotid stent on Plavix now 7. Recent PNA 8. A/C renal failure 9. H/o ETOH and tobacco abuse, now quit  PLAN/DISCUSSION:  Very difficult situation. I have spent nearly 90 minutes reviewing all his records including notes from recent admission. I reviewed CXR, ECG and echo personally. Although posterior leaflet of MV not overly restricted my suspicion is that he has severe ischemic MR in the setting of iCM. Currently remains very tenuous with evidence of low output physiology.  Will start milrinone and digoxin. Continue IV lasix. Will need R/L cath Monday if renal function stable. Will also likelly need TEE if respiratory status can tolerate. Will hold Plavix for now as i suspect he may need CABG +/- MVR.   Will follow closely. Start b-blocker when BP tolerates.    LOS: 5 days    Arvilla Meres, MD 10/14/2011, 12:46 PM

## 2011-10-14 NOTE — Progress Notes (Signed)
RT completed protocol assessment of patient.  Patient wears oxygen at home and is on his home liter flow.  Patient states that he gets short of breath when he gets up and moves around but this is his baseline.  Patient currently is taking no medications at home.  Has a long smoking history but states that he quit 1 month ago. Patient does not feel like treatments are doing him any good.  Did not take his 0200 treatment and Lung sounds are still clear bilaterally.  RT will continue to monitor patient.  Treatments will be changed to every six hours as needed.

## 2011-10-15 LAB — BASIC METABOLIC PANEL
BUN: 14 mg/dL (ref 6–23)
CO2: 29 mEq/L (ref 19–32)
Chloride: 101 mEq/L (ref 96–112)
Creatinine, Ser: 1.01 mg/dL (ref 0.50–1.35)

## 2011-10-15 MED ORDER — SODIUM CHLORIDE 0.9 % IV SOLN: 250.0000 mL | INTRAVENOUS | Status: DC | PRN

## 2011-10-15 MED ORDER — SODIUM CHLORIDE 0.9 % IJ SOLN
3.0000 mL | Freq: Two times a day (BID) | INTRAMUSCULAR | Status: DC
  Administered 2011-10-15 (×2): 3 mL via INTRAVENOUS

## 2011-10-15 MED ORDER — ASPIRIN EC 81 MG PO TBEC
81.0000 mg | DELAYED_RELEASE_TABLET | Freq: Every day | ORAL | Status: DC
  Administered 2011-10-17 – 2011-10-18 (×2): 81 mg via ORAL
  Filled 2011-10-15 (×2): qty 1

## 2011-10-15 MED ORDER — FUROSEMIDE 40 MG PO TABS
40.0000 mg | ORAL_TABLET | Freq: Every day | ORAL | Status: DC
  Administered 2011-10-17 – 2011-10-19 (×3): 40 mg via ORAL
  Filled 2011-10-15 (×5): qty 1

## 2011-10-15 MED ORDER — POTASSIUM CHLORIDE CRYS ER 20 MEQ PO TBCR
40.0000 meq | EXTENDED_RELEASE_TABLET | Freq: Two times a day (BID) | ORAL | Status: DC
  Administered 2011-10-15 – 2011-10-19 (×9): 40 meq via ORAL
  Filled 2011-10-15 (×13): qty 2

## 2011-10-15 MED ORDER — SODIUM CHLORIDE 0.9 % IJ SOLN: 3.0000 mL | INTRAMUSCULAR | Status: DC | PRN

## 2011-10-15 MED ORDER — SODIUM CHLORIDE 0.9 % IV SOLN
INTRAVENOUS | Status: DC
  Administered 2011-10-16: 06:00:00 via INTRAVENOUS

## 2011-10-15 MED ORDER — CARVEDILOL 3.125 MG PO TABS
3.1250 mg | ORAL_TABLET | Freq: Two times a day (BID) | ORAL | Status: DC
  Administered 2011-10-15: 3.125 mg via ORAL
  Filled 2011-10-15 (×2): qty 1

## 2011-10-15 MED ORDER — ASPIRIN 81 MG PO CHEW
324.0000 mg | CHEWABLE_TABLET | ORAL | Status: AC
  Administered 2011-10-16: 324 mg via ORAL
  Filled 2011-10-15: qty 4

## 2011-10-15 NOTE — Progress Notes (Signed)
ANTIBIOTIC CONSULT NOTE - FOLLOW UP  Pharmacy Consult for Cefepime Indication: rule out pneumonia  Allergies  Allergen Reactions  . Metoprolol Shortness Of Breath and Nausea And Vomiting  . Morphine And Related Hives and Itching  . Other Other (See Comments)    Peaches cause hives and itching   . Peach Flavor Hives and Itching    Patient Measurements: Height: 5\' 8"  (172.7 cm) Weight: 173 lb 8 oz (78.7 kg) (Scale A.) IBW/kg (Calculated) : 68.4    Vital Signs: Temp: 97.4 F (36.3 C) (04/21 0500) Temp src: Oral (04/21 0500) BP: 114/74 mmHg (04/21 1314) Pulse Rate: 111  (04/21 1314) Intake/Output from previous day: 04/20 0701 - 04/21 0700 In: 710.3 [P.O.:580; I.V.:22.3; IV Piggyback:108] Out: 3075 [Urine:3075] Intake/Output from this shift: Total I/O In: 603 [P.O.:600; I.V.:3] Out: 2150 [Urine:2150]  Labs:  [facility] 10/15/11 0620 10/13/11 0532  WBC -- 11.0*  HGB -- 12.3*  PLT -- 257  LABCREA -- --  CREATININE 1.01 1.42*   Estimated Creatinine Clearance: 79 ml/min (by C-G formula based on Cr of 1.01). No results found for this basename: VANCOTROUGH:2,VANCOPEAK:2,VANCORANDOM:2,GENTTROUGH:2,GENTPEAK:2,GENTRANDOM:2,TOBRATROUGH:2,TOBRAPEAK:2,TOBRARND:2,AMIKACINPEAK:2,AMIKACINTROU:2,AMIKACIN:2, in the last 72 hours   Microbiology: Recent Results (from the past 720 hour(s))  URINE CULTURE     Status: Normal   Collection Time   10/09/11 10:22 PM      Component Value Range Status Comment   Specimen Description URINE, CLEAN CATCH   Final    Special Requests NONE   Final    Culture  Setup Time [id number]   Final    Colony Count NO GROWTH   Final    Culture NO GROWTH   Final    Report Status 10/11/2011 FINAL   Final   MRSA PCR SCREENING     Status: Normal   Collection Time   10/10/11  2:26 AM      Component Value Range Status Comment   MRSA by PCR NEGATIVE  NEGATIVE  Final     Anti-infectives     Start     Dose/Rate Route Frequency Ordered Stop   10/13/11 1800    moxifloxacin (AVELOX) tablet 400 mg        400 mg Oral Daily-1800 10/13/11 1339     10/13/11 1000   vancomycin (VANCOCIN) 1,250 mg in sodium chloride 0.9 % 250 mL IVPB  Status:  Discontinued        1,250 mg 166.7 mL/hr over 90 Minutes Intravenous Every 24 hours 10/12/11 0836 10/12/11 0944   10/10/11 1600   vancomycin (VANCOCIN) IVPB 1000 mg/200 mL premix  Status:  Discontinued        1,000 mg 200 mL/hr over 60 Minutes Intravenous Every 12 hours 10/10/11 0852 10/12/11 0832   10/10/11 1500   ceFEPIme (MAXIPIME) 1 g in dextrose 5 % 50 mL IVPB        1 g 100 mL/hr over 30 Minutes Intravenous Every 12 hours 10/10/11 0852     10/10/11 0400   vancomycin (VANCOCIN) IVPB 1000 mg/200 mL premix        1,000 mg 200 mL/hr over 60 Minutes Intravenous  Once 10/10/11 0230 10/10/11 0521   10/10/11 0330   ceFEPIme (MAXIPIME) 1 g in dextrose 5 % 50 mL IVPB        1 g 100 mL/hr over 30 Minutes Intravenous  Once 10/10/11 0230 10/10/11 0450   10/10/11 0230   moxifloxacin (AVELOX) IVPB 400 mg  Status:  Discontinued        400 mg 250  mL/hr over 60 Minutes Intravenous Daily at bedtime 10/10/11 0148 10/13/11 1339          Assessment: 55 YOM transferred from [facility] for SOB (CHF vs. PNA), Two recent hospitalizations- NSTEMI and then HCAP. Pt currently on D#6 of Cefepime/Avelox for possible PNA. WBC down to 11.0 on 4/19 and pt is afebrile today. Renal fx has improved (scr 1.01 est crcl ~80 ml/min) with good UOP: 1.72ml/kg/hr.  Plan:  1) Continue Cefepime 1gram IV q12hrs 2) Monitor renal fx, WBC and fever trends   Janace Litten, PharmD [phone] 04-25-202013,1:46 PM

## 2011-10-15 NOTE — Progress Notes (Addendum)
Subjective:   Charles Savage is an 55 y.o. male followed by Dr. Donnie Aho. He has h/o of ETOH and tobacco use. He was apparently doing OK until he had a stroke in February 2013, treated by L CEA at [facility].   He was admitted to [facility] in 3/17-3/27 with respiratory failure associated with COPD and healthcare associated pneumonia Hospitalization complicated by as well as non-ST EMI (peak Troponin ~2), and acute renal failure with ATN. Echo at that time EF 40-45% with global HK and inferior akinesis and severe posterior MR. Managed medically due to renal failure.   Readmitted 4/4-4/8 with recurrent SOB and renal failure thought due to persistent PNA.  Echo repeated EF 35-40% with moderate to severe MR   Readmitted to [facility] 4/15  With systolic HF and hypotension. CE normal. Weight down about 12 pounds since admit. Cr initially 1.8 now 1.4. SBP 95-110. ECG (which I reviewed personally) ST 110 nonspecific TWA. CXR with stable patchy airspace disease suggestive of PNA. Effusions resolving. On avelox/cefepime  Started on milrinone yesterday for presumed low output in setting of probable ischemic cardiomyopathy and MR. Renal function much improved. Breathing better.  No CP. Brief run SVT this am on tele.  Intake/Output Summary (Last 24 hours) at 10/15/11 1116 Last data filed at 10/15/11 1100  Gross per 24 hour  Intake 950.32 ml  Output   3425 ml  Net -2474.68 ml    Current meds:    . antiseptic oral rinse  15 mL Mouth Rinse BID  . aspirin EC  81 mg Oral Daily  . ceFEPime (MAXIPIME) IV  1 g Intravenous Q12H  . digoxin  0.125 mg Oral Daily  . docusate sodium  100 mg Oral BID  . enoxaparin  40 mg Subcutaneous Q24H  . furosemide  40 mg Intravenous Daily  . lisinopril  2.5 mg Oral Daily  . moxifloxacin  400 mg Oral q1800  . nicotine  14 mg Transdermal Daily  . potassium chloride  40 mEq Oral BID  . sodium chloride  3 mL Intravenous Q12H  . sodium chloride  3 mL Intravenous Q12H  .  DISCONTD: clopidogrel  75 mg Oral Q breakfast   Infusions:    . milrinone 0.25 mcg/kg/min (10/14/11 1550)     Objective:  Blood pressure 111/67, pulse 105, temperature 97.4 F (36.3 C), temperature source Oral, resp. rate 20, height 5\' 8"  (1.727 m), weight 78.7 kg (173 lb 8 oz), SpO2 95.00%. Weight change: -3.4 kg (-7 lb 7.9 oz)  Physical Exam: General:  Mildly chronically ill appearing. No acute distress. HEENT: normal Neck: supple. JVP 6-7 . Carotids 1+ bialterally No lymphadenopathy or thryomegaly appreciated. Cor: PMI nonpalpable  Regular rate & rhythm. 2/6 MR Lungs: decreased throughout otherwise clear Abdomen: soft, nontender, nondistended. No hepatosplenomegaly. No bruits or masses. Good bowel sounds. Large ecchymosis LLQ Extremities: no cyanosis, clubbing, rash, edema Neuro: alert & orientedx3, cranial nerves grossly intact. moves all 4 extremities w/o difficulty. Affect pleasant  Telemetry:  ST 105 with brief run SVT  Lab Results: Basic Metabolic Panel:  Lab 10/15/11 1610 10/13/11 0532 10/12/11 0537 10/11/11 0543 10/10/11 0500  NA 141 136 132* 134* 133*  K 3.1* 3.8 -- -- --  CL 101 98 95* 96 94*  CO2 29 30 26 27 23   GLUCOSE 100* 136* 111* 128* 216*  BUN 14 35* 51* 45* 32*  CREATININE 1.01 1.42* 1.84* 1.70* 1.33  CALCIUM 8.7 8.8 8.5 8.4 9.2  MG -- -- -- -- --  PHOS -- -- -- -- --   Liver Function Tests:  Lab 10/10/11 0500 10/09/11 2011  AST 47* 58*  ALT 52 56*  ALKPHOS 102 111  BILITOT 0.8 1.0  PROT 7.4 7.8  ALBUMIN 3.5 3.9   No results found for this basename: LIPASE:5,AMYLASE:5 in the last 168 hours No results found for this basename: AMMONIA:5 in the last 168 hours CBC:  Lab 10/13/11 0532 10/12/11 0537 10/10/11 0500 10/09/11 2011  WBC 11.0* 13.4* 7.2 13.2*  NEUTROABS -- -- -- 7.3  HGB 12.3* 12.5* 12.2* 13.3  HCT 38.0* 38.1* 37.8* 40.5  MCV 94.5 93.2 93.1 93.3  PLT 257 240 252 271   Cardiac Enzymes:  Lab 10/10/11 1743 10/10/11 0941 10/10/11  0205  CKTOTAL 58 55 62  CKMB 4.0 4.1* 4.3*  CKMBINDEX -- -- --  TROPONINI <0.30 <0.30 <0.30   BNP: No components found with this basename: POCBNP:5 CBG: No results found for this basename: GLUCAP:5 in the last 168 hours Microbiology: Lab Results  Component Value Date   CULT NO GROWTH 10/09/2011   CULT NORMAL OROPHARYNGEAL FLORA 09/14/2011   CULT NO GROWTH 5 DAYS 09/10/2011   CULT NO GROWTH 5 DAYS 09/10/2011    Lab 10/09/11 2222  CULT NO GROWTH  SDES URINE, CLEAN CATCH    Imaging: No results found.   ASSESSMENT:  1. A/c systolic CHF 2. Ischemic CM EF 35-40% 3. Moderate to severe mitral regurgitation 4. A/C respiratory failure  5. COPD 6. CVA 2/13 - s/p L CEA on Plavix now  7. Recent PNA 8. A/C renal failure 9. H/o ETOH and tobacco abuse, now quit 10. SVT, brief  PLAN/DISCUSSION:  Although posterior leaflet of MV not overly restricted my suspicion is that he has severe ischemic MR in the setting of iCM. He is improved on milrinone.  Will change to po lasix. Schedule R/L cath for tomorrow. Will also likelly need TEE. Will hold Plavix for now as i suspect he may need CABG +/- MVR.   Start b-blocker.   LOS: 6 days    Arvilla Meres, MD January 31, 202013, 11:16 AM

## 2011-10-15 NOTE — Progress Notes (Signed)
[name] Potassium level is 3.1 and heart rate just increased to the 140s. MD notified and no new orders given at this time. Will continue to monitor patient for further changes in condition.

## 2011-10-15 NOTE — Progress Notes (Signed)
Patient had a run of SVT while heart rate was in the 140s. Non-sustaining and patient is asymptomatic with no signs or symptoms of discomfort or distress. MD aware no new orders at this time. Will continue to monitor patient for further changes in condition.

## 2011-10-15 NOTE — Progress Notes (Signed)
BP increased to 111/64. Patient stated that he was feeling a little better. Will continue to monitor patient for further changes in condition.

## 2011-10-15 NOTE — Progress Notes (Signed)
[name] BP is 82/52 and patient complained of feeling weak. Notified PA to let him know that adjustments were made to medications and that it may have made his BP decrease. PA is aware and will take a look at medications. Will continue to monitor BP and for further changes in condition.

## 2011-10-16 ENCOUNTER — Encounter (HOSPITAL_COMMUNITY)
Admission: EM | Disposition: A | Discharge status: Home / Self Care | Payer: Self-pay | Source: Home / Self Care | Attending: Internal Medicine

## 2011-10-16 ENCOUNTER — Encounter (HOSPITAL_COMMUNITY): Payer: Self-pay | Admitting: Internal Medicine

## 2011-10-16 ENCOUNTER — Other Ambulatory Visit: Payer: Self-pay

## 2011-10-16 DIAGNOSIS — I509 Heart failure, unspecified: Secondary | ICD-10-CM

## 2011-10-16 DIAGNOSIS — I2510 Atherosclerotic heart disease of native coronary artery without angina pectoris: Secondary | ICD-10-CM

## 2011-10-16 DIAGNOSIS — I059 Rheumatic mitral valve disease, unspecified: Secondary | ICD-10-CM

## 2011-10-16 HISTORY — PX: LEFT AND RIGHT HEART CATHETERIZATION WITH CORONARY ANGIOGRAM: SHX5449

## 2011-10-16 LAB — CBC
HCT: 41.6 % (ref 39.0–52.0)
Hemoglobin: 13.5 g/dL (ref 13.0–17.0)
MCH: 31.2 pg (ref 26.0–34.0)
MCH: 30.7 pg (ref 26.0–34.0)
MCV: 92.2 fL (ref 78.0–100.0)
Platelets: 249 10*3/uL (ref 150–400)
RBC: 4.61 MIL/uL (ref 4.22–5.81)
RBC: 4.4 MIL/uL (ref 4.22–5.81)

## 2011-10-16 LAB — BASIC METABOLIC PANEL
CO2: 29 mEq/L (ref 19–32)
Calcium: 9.2 mg/dL (ref 8.4–10.5)
Creatinine, Ser: 0.99 mg/dL (ref 0.50–1.35)
Glucose, Bld: 102 mg/dL — ABNORMAL HIGH (ref 70–99)
Sodium: 139 mEq/L (ref 135–145)

## 2011-10-16 LAB — POCT I-STAT 3, ART BLOOD GAS (G3+)
Acid-Base Excess: 2 mmol/L (ref 0.0–2.0)
Bicarbonate: 27.5 mEq/L — ABNORMAL HIGH (ref 20.0–24.0)
Bicarbonate: 27.9 meq/L — ABNORMAL HIGH (ref 20.0–24.0)
O2 Saturation: 67 %
TCO2: 28 mmol/L (ref 0–100)
TCO2: 29 mmol/L (ref 0–100)
TCO2: 29 mmol/L (ref 0–100)
pCO2 arterial: 39.2 mmHg (ref 35.0–45.0)
pCO2 arterial: 43.8 mmHg (ref 35.0–45.0)
pCO2 arterial: 46.2 mmHg — ABNORMAL HIGH (ref 35.0–45.0)
pH, Arterial: 7.389 (ref 7.350–7.450)
pH, Arterial: 7.406 (ref 7.350–7.450)
pH, Arterial: 7.437 (ref 7.350–7.450)
pO2, Arterial: 36 mmHg — CL (ref 80.0–100.0)
pO2, Arterial: 64 mmHg — ABNORMAL LOW (ref 80.0–100.0)

## 2011-10-16 LAB — CREATININE, SERUM
Creatinine, Ser: 1 mg/dL (ref 0.50–1.35)
GFR calc Af Amer: >90 mL/min
GFR calc non Af Amer: 82 mL/min — ABNORMAL LOW

## 2011-10-16 SURGERY — LEFT AND RIGHT HEART CATHETERIZATION WITH CORONARY ANGIOGRAM
Anesthesia: LOCAL

## 2011-10-16 MED ORDER — ENOXAPARIN SODIUM 40 MG/0.4ML ~~LOC~~ SOLN: 40.0000 mg | SUBCUTANEOUS | Status: DC

## 2011-10-16 MED ORDER — SODIUM CHLORIDE 0.9 % IV SOLN: INTRAVENOUS | Status: AC

## 2011-10-16 MED ORDER — ONDANSETRON HCL 4 MG/2ML IJ SOLN: 4.0000 mg | Freq: Four times a day (QID) | INTRAMUSCULAR | Status: DC | PRN

## 2011-10-16 MED ORDER — HEPARIN (PORCINE) IN NACL 2-0.9 UNIT/ML-% IJ SOLN
INTRAMUSCULAR | Status: AC
  Filled 2011-10-16: qty 2000

## 2011-10-16 MED ORDER — LIDOCAINE HCL (PF) 1 % IJ SOLN
INTRAMUSCULAR | Status: AC
  Filled 2011-10-16: qty 30

## 2011-10-16 MED ORDER — FENTANYL CITRATE 0.05 MG/ML IJ SOLN
INTRAMUSCULAR | Status: AC
  Filled 2011-10-16: qty 2

## 2011-10-16 MED ORDER — CARVEDILOL 3.125 MG PO TABS
3.1250 mg | ORAL_TABLET | Freq: Two times a day (BID) | ORAL | Status: DC
  Administered 2011-10-16 – 2011-10-17 (×2): 3.125 mg via ORAL
  Filled 2011-10-16 (×5): qty 1

## 2011-10-16 MED ORDER — NITROGLYCERIN 0.2 MG/ML ON CALL CATH LAB
INTRAVENOUS | Status: AC
  Filled 2011-10-16: qty 1

## 2011-10-16 MED ORDER — ACETAMINOPHEN 325 MG PO TABS: 650.0000 mg | ORAL_TABLET | ORAL | Status: DC | PRN

## 2011-10-16 MED ORDER — HYDROCODONE-ACETAMINOPHEN 5-325 MG PO TABS
ORAL_TABLET | ORAL | Status: AC
  Filled 2011-10-16: qty 2

## 2011-10-16 MED ORDER — MIDAZOLAM HCL 2 MG/2ML IJ SOLN
INTRAMUSCULAR | Status: AC
  Filled 2011-10-16: qty 2

## 2011-10-16 NOTE — Progress Notes (Signed)
0815 to cardiac cath with transporter via stretcher . With in progress with iv and milrinone drip at 6.1 ml /hr   Pt with O2  At 3l/m [facility] [place] . Voided prior transfer . Family in attendance . Emotional support to pt and family

## 2011-10-16 NOTE — CV Procedure (Addendum)
Cardiac Cath Procedure Note  Indication: NSTEMI, CHF, MR  Procedures performed:  1) Right heart cathererization 2) Selective coronary angiography 3) Left heart catheterization 4) Left ventriculogram 5) Subclavian angiography (pre-CABG)  Description of procedure:     The risks and indication of the procedure were explained. Consent was signed and placed on the chart. An appropriate timeout was taken prior to the procedure. The right groin was prepped and draped in the routine sterile fashion and anesthetized with 1% local lidocaine.   A 5 FR arterial sheath was placed in the right femoral artery using a modified Seldinger technique. Standard catheters including a JL4, JR4 and angled pigtail were used. All catheter exchanges were made over a wire. A 7 FR venous sheath was placed in the right femoral vein using a modified Seldinger technique. A standard Swan-Ganz catheter was used for the procedure.   Complications:  None apparent  Findings:  On milrinone 0.25 mg.kg/min  RA = 5  RV = 30/5/7 PA = 30/16 (23) PCW = 20 (no significant v-waves) Fick cardiac output/index = 5.46/2.8 PVR = 0.5 [place] SVR = 981  FA sat = 93% PA sat = 67%, 71% (on milrinone)  Ao Pressure: 92/63 (76) LV Pressure: 98/8/18  There was no signficant gradient across the aortic valve on pullback.  Left main: 80-90% distal  LAD: Flush occlusion at ostium. Mild filling in mid and distal segments through L to L and R to L collaterals  LCX: Large. Dominant. 70-80% mid AV groove. OM-1 small to moderate vessel with mild ostial disease. OM-2 very large 99% lesion in mid section at trifurcation. 90% lesion in lowest branch. PDA 50-60 ostial  RCA: Small to moderate-sized, non- dominant vessel ending with acute marginal branch. Diffuse 60% prox to mid. 95% midsection. R to L collats to LAD from acute marginal.  LV-gram done in the RAO projection: Ejection fraction = 25-30% with global HK 3+ MR  L subclavian: Widely  patent to chest wall with mild plaquing  Assessment:  1. Severe 3v CAD including high-grade ostial LM disease 2. Ischemic CM with EF 25-30% 3. 3+ mitral regurgitation 4. Well compensated hemodynamics on milrinone  Plan/Discussion:  Will need CABG/MVR. Check PFTs and TEE. Continue milrinone. Transfer stepdown. Consult TCTS.   Arvilla Meres, MD 9:29 AM

## 2011-10-16 NOTE — Consult Note (Signed)
[address]            [place] [address]          [phone]      Reason for Consult:Severe Left Main and multi-vessel CAD, Severe MR, ischemic cardiomyopathy Referring Physician: Dr. Juanita Laster is an 56 y.o. male.  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 He initially presented to [facility] and was transferred to [facility] where he underwent left carotid endarterectomy. He apparently suffered a small perioperative stroke. He has made a near complete recovery from his strokes. He reports that since his carotid surgery he has had worsening shortness of breath. He was admitted to [facility] from 3/17- 09/20/2011 with respiratory failure and a diagnosis of COPD and health care- associated pneumonia. This hospitalization was complicated by a non-ST segment elevation MI and acute renal failure. An echocardiogram showed an ejection fraction of 40-45% with global hypokinesis and inferior akinesis. There was severe mitral regurgitation. He was managed medically due to his renal failure which resolved. He was readmitted from  4/4-4/8 with recurrent shortness of breath and renal failure felt to be due to persistent pneumonia. An echocardiogram was repeated and showed ejection fraction of 35-40% with moderate to severe mitral regurgitaion. He was then readmitted to [facility] on 10/09/2011 with a diagnosis of systolic heart failure and hypotension and a chest x-ray showing patchy air space disease suggesting pneumonia. He was transferred to [facility] and started on milrinone yesterday for presumed low cardiac output in the setting of ischemic cardiomyopathy and mitral regurgitation. His breathing improved. He underwent cardiac catheterization today showing high-grade left main and severe three-vessel coronary disease with 3+ mitral  regurgitation.  Past Medical History  Diagnosis Date  . Chronic pain     back pain  . Stroke   . Hyperlipemia   . Lumbar disc disease   . Carotid artery disease   . Myocardial infarct   . Pneumonia   . CHF (congestive heart failure)   . COPD (chronic obstructive pulmonary disease)     on 2L O2 at home since 08/2011  . Coronary artery disease   . Shortness of breath     Past Surgical History  Procedure Date  . Back surgery [id number]  . Knee arthroscopy 2002    rt  . Foreign body removal 06/05/2011    Procedure: FOREIGN BODY REMOVAL ADULT;  Surgeon: Rosalio Macadamia;  Location: [facility];  Service: Plastics;  Laterality: Right;  removal foreign body from right side face   . Carotid endarterectomy     Family History  Problem Relation Age of Onset  . Coronary artery disease Mother   . Coronary artery disease Father   . Arrhythmia Brother     Social History:  reports that he quit smoking about 5 weeks ago. His smoking use included Cigarettes. He has a 60 pack-year smoking history. He does not have any smokeless tobacco history on file. He reports that he does not drink alcohol or use illicit drugs.  Allergies:  Allergies  Allergen Reactions  . Metoprolol Shortness Of Breath and Nausea And Vomiting  . Morphine And Related Hives and Itching  . Other Other (See Comments)    Peaches cause hives and itching   .  Peach Flavor Hives and Itching    Medications:  Prior to Admission:  Prescriptions prior to admission  Medication Sig Dispense Refill  . albuterol (PROVENTIL HFA;VENTOLIN HFA) 108 (90 BASE) MCG/ACT inhaler Inhale 1-2 puffs into the lungs every 6 (six) hours as needed for wheezing.  1 Inhaler  0  . ALPRAZolam (XANAX) 0.25 MG tablet Take 1 tablet (0.25 mg total) by mouth 3 (three) times daily as needed for anxiety.  30 tablet  0  . aspirin EC 81 MG EC tablet Take 1 tablet (81 mg total) by mouth daily.  30 tablet  0  . carvedilol (COREG) 3.125 MG  tablet Take 1 tablet (3.125 mg total) by mouth 2 (two) times daily with a meal.  60 tablet  0  . clopidogrel (PLAVIX) 75 MG tablet Take 1 tablet (75 mg total) by mouth daily with breakfast.  30 tablet  0  . cyclobenzaprine (FLEXERIL) 10 MG tablet Take 10 mg by mouth 3 (three) times daily as needed.        Marland Kitchen guaiFENesin (MUCINEX) 600 MG 12 hr tablet Take 2 tablets (1,200 mg total) by mouth 2 (two) times daily.  30 tablet  0  . HYDROcodone-acetaminophen (NORCO) 10-325 MG per tablet Take 1 tablet by mouth every 8 (eight) hours as needed. Pain      . nicotine (NICODERM CQ - DOSED IN MG/24 HOURS) 14 mg/24hr patch Place 1 patch onto the skin daily.      Marland Kitchen moxifloxacin (AVELOX) 400 MG tablet Take 1 tablet (400 mg total) by mouth daily at 6 PM.  7 tablet  0   Scheduled:   . antiseptic oral rinse  15 mL Mouth Rinse BID  . aspirin  324 mg Oral Pre-Cath  . aspirin EC  81 mg Oral Daily  . carvedilol  3.125 mg Oral BID WC  . ceFEPime (MAXIPIME) IV  1 g Intravenous Q12H  . digoxin  0.125 mg Oral Daily  . docusate sodium  100 mg Oral BID  . enoxaparin  40 mg Subcutaneous Q24H  . furosemide  40 mg Oral Daily  . heparin      . lidocaine      . lisinopril  2.5 mg Oral Daily  . midazolam      . moxifloxacin  400 mg Oral q1800  . nicotine  14 mg Transdermal Daily  . nitroGLYCERIN      . potassium chloride  40 mEq Oral BID  . sodium chloride  3 mL Intravenous Q12H  . DISCONTD: enoxaparin  40 mg Subcutaneous Q24H  . DISCONTD: sodium chloride  3 mL Intravenous Q12H   Continuous:   . sodium chloride    . milrinone 0.25 mcg/kg/min (10/16/11 1530)  . DISCONTD: sodium chloride 75 mL/hr at 10/16/11 0545   ZOX:[id number], acetaminophen, acetaminophen, ALPRAZolam, cyclobenzaprine, HYDROcodone-acetaminophen, levalbuterol, ondansetron (ZOFRAN) IV, ondansetron, DISCONTD: sodium chloride, DISCONTD: ondansetron (ZOFRAN) IV, DISCONTD: sodium chloride Anti-infectives     Start     Dose/Rate Route Frequency  Ordered Stop   10/13/11 1800   moxifloxacin (AVELOX) tablet 400 mg        400 mg Oral Daily-1800 10/13/11 1339     10/13/11 1000   vancomycin (VANCOCIN) 1,250 mg in sodium chloride 0.9 % 250 mL IVPB  Status:  Discontinued        1,250 mg 166.7 mL/hr over 90 Minutes Intravenous Every 24 hours 10/12/11 0836 10/12/11 0944   10/10/11 1600   vancomycin (VANCOCIN) IVPB 1000 mg/200 mL premix  Status:  Discontinued        1,000 mg 200 mL/hr over 60 Minutes Intravenous Every 12 hours 10/10/11 0852 10/12/11 0832   10/10/11 1500   ceFEPIme (MAXIPIME) 1 g in dextrose 5 % 50 mL IVPB        1 g 100 mL/hr over 30 Minutes Intravenous Every 12 hours 10/10/11 0852     10/10/11 0400   vancomycin (VANCOCIN) IVPB 1000 mg/200 mL premix        1,000 mg 200 mL/hr over 60 Minutes Intravenous  Once 10/10/11 0230 10/10/11 0521   10/10/11 0330   ceFEPIme (MAXIPIME) 1 g in dextrose 5 % 50 mL IVPB        1 g 100 mL/hr over 30 Minutes Intravenous  Once 10/10/11 0230 10/10/11 0450   10/10/11 0230   moxifloxacin (AVELOX) IVPB 400 mg  Status:  Discontinued        400 mg 250 mL/hr over 60 Minutes Intravenous Daily at bedtime 10/10/11 0148 10/13/11 1339          Results for orders placed during the hospital encounter of 10/09/11 (from the past 48 hour(s))  BASIC METABOLIC PANEL     Status: Abnormal   Collection Time   10/15/11  6:20 AM      Component Value Range Comment   Sodium 141  135 - 145 (mEq/L)    Potassium 3.1 (*) 3.5 - 5.1 (mEq/L)    Chloride 101  96 - 112 (mEq/L)    CO2 29  19 - 32 (mEq/L)    Glucose, Bld 100 (*) 70 - 99 (mg/dL)    BUN 14  6 - 23 (mg/dL)    Creatinine, Ser 1.61  0.50 - 1.35 (mg/dL)    Calcium 8.7  8.4 - 10.5 (mg/dL)    GFR calc non Af Amer 81 (*) >90 (mL/min)    GFR calc Af Amer >90  >90 (mL/min)   BASIC METABOLIC PANEL     Status: Abnormal   Collection Time   10/16/11  5:32 AM      Component Value Range Comment   Sodium 139  135 - 145 (mEq/L)    Potassium 3.6  3.5 - 5.1  (mEq/L)    Chloride 99  96 - 112 (mEq/L)    CO2 29  19 - 32 (mEq/L)    Glucose, Bld 102 (*) 70 - 99 (mg/dL)    BUN 13  6 - 23 (mg/dL)    Creatinine, Ser 0.96  0.50 - 1.35 (mg/dL)    Calcium 9.2  8.4 - 10.5 (mg/dL)    GFR calc non Af Amer 90 (*) >90 (mL/min)    GFR calc Af Amer >90  >90 (mL/min)   CBC     Status: Abnormal   Collection Time   10/16/11  5:32 AM      Component Value Range Comment   WBC 10.8 (*) 4.0 - 10.5 (K/uL)    RBC 4.61  4.22 - 5.81 (MIL/uL)    Hemoglobin 14.4  13.0 - 17.0 (g/dL)    HCT 04.5  40.9 - 81.1 (%)    MCV 92.2  78.0 - 100.0 (fL)    MCH 31.2  26.0 - 34.0 (pg)    MCHC 33.9  30.0 - 36.0 (g/dL)    RDW 91.4 (*) 78.2 - 15.5 (%)    Platelets 249  150 - 400 (K/uL)   POCT I-STAT 3, BLOOD GAS (G3+)     Status: Abnormal   Collection Time  10/16/11  9:08 AM      Component Value Range Comment   pH, Arterial 7.437  7.350 - 7.450     pCO2 arterial 39.2  35.0 - 45.0 (mmHg)    pO2, Arterial 64.0 (*) 80.0 - 100.0 (mmHg)    Bicarbonate 26.4 (*) 20.0 - 24.0 (mEq/L)    TCO2 28  0 - 100 (mmol/L)    O2 Saturation 93.0      Acid-Base Excess 2.0  0.0 - 2.0 (mmol/L)    Sample type ARTERIAL     POCT I-STAT 3, BLOOD GAS (G3+)     Status: Abnormal   Collection Time   10/16/11  9:13 AM      Component Value Range Comment   pH, Arterial 7.389  7.350 - 7.450     pCO2 arterial 46.2 (*) 35.0 - 45.0 (mmHg)    pO2, Arterial 36.0 (*) 80.0 - 100.0 (mmHg)    Bicarbonate 27.9 (*) 20.0 - 24.0 (mEq/L)    TCO2 29  0 - 100 (mmol/L)    O2 Saturation 67.0      Acid-Base Excess 2.0  0.0 - 2.0 (mmol/L)    Sample type ARTERIAL     POCT I-STAT 3, BLOOD GAS (G3+)     Status: Abnormal   Collection Time   10/16/11  9:14 AM      Component Value Range Comment   pH, Arterial 7.406  7.350 - 7.450     pCO2 arterial 43.8  35.0 - 45.0 (mmHg)    pO2, Arterial 37.0 (*) 80.0 - 100.0 (mmHg)    Bicarbonate 27.5 (*) 20.0 - 24.0 (mEq/L)    TCO2 29  0 - 100 (mmol/L)    O2 Saturation 71.0      Acid-Base  Excess 2.0  0.0 - 2.0 (mmol/L)    Sample type ARTERIAL     MRSA PCR SCREENING     Status: Normal   Collection Time   10/16/11  3:40 PM      Component Value Range Comment   MRSA by PCR NEGATIVE  NEGATIVE    CBC     Status: Abnormal   Collection Time   10/16/11  4:45 PM      Component Value Range Comment   WBC 7.3  4.0 - 10.5 (K/uL)    RBC 4.40  4.22 - 5.81 (MIL/uL)    Hemoglobin 13.5  13.0 - 17.0 (g/dL)    HCT 45.4  09.8 - 11.9 (%)    MCV 94.5  78.0 - 100.0 (fL)    MCH 30.7  26.0 - 34.0 (pg)    MCHC 32.5  30.0 - 36.0 (g/dL)    RDW 14.7 (*) 82.9 - 15.5 (%)    Platelets 228  150 - 400 (K/uL)   CREATININE, SERUM     Status: Abnormal   Collection Time   10/16/11  4:45 PM      Component Value Range Comment   Creatinine, Ser 1.00  0.50 - 1.35 (mg/dL)    GFR calc non Af Amer 82 (*) >90 (mL/min)    GFR calc Af Amer >90  >90 (mL/min)     No results found.  Review of Systems  Constitutional: Positive for malaise/fatigue. Negative for fever, chills, weight loss and diaphoresis.  HENT: Negative.   Eyes: Negative.   Respiratory: Positive for cough and shortness of breath. Negative for hemoptysis, sputum production and wheezing.   Cardiovascular: Positive for orthopnea, leg swelling and PND. Negative for chest pain, palpitations and claudication.  Gastrointestinal: Negative.   Genitourinary: Negative.   Musculoskeletal: Negative.   Skin: Negative.   Neurological: Positive for weakness.  Endo/Heme/Allergies: Negative.   Psychiatric/Behavioral: Negative.    Blood pressure 120/66, pulse 104, temperature 97.8 F (36.6 C), temperature source Oral, resp. rate 17, height 5\' 6"  (1.676 m), weight 79.1 kg (174 lb 6.1 oz), SpO2 95.00%. Physical Exam  Constitutional: He is oriented to person, place, and time. He appears well-developed and well-nourished. No distress.  HENT:  Head: Normocephalic and atraumatic.  Mouth/Throat: Oropharynx is clear and moist.  Eyes: EOM are normal. Pupils are  equal, round, and reactive to light.  Neck: No JVD present. No thyromegaly present.  Cardiovascular: Normal rate, regular rhythm and intact distal pulses.  Exam reveals no gallop and no friction rub.   Murmur heard.      2/6 systolic murmur at apex radiating to axilla  Respiratory: Effort normal. No respiratory distress. He has no wheezes. He has no rales.  GI: Soft. Bowel sounds are normal. He exhibits no distension and no mass. There is no tenderness.  Musculoskeletal: He exhibits no edema.  Lymphadenopathy:    He has no cervical adenopathy.  Neurological: He is alert and oriented to person, place, and time. He has normal strength. No cranial nerve deficit or sensory deficit.  Skin: Skin is warm and dry. He is not diaphoretic.  Psychiatric: He has a normal mood and affect.    Transthoracic Echocardiography  Patient:    Tywan, Siever MR #:       [id number] Study Date: 09/19/2011 Gender:     M Age:        55 Height:     172.7cm Weight:     91.6kg BSA:        2.74m^2 Pt. Status: Room:       3708    PERFORMING   W. Viann Fish, MD [facility]  ADMITTING    Lonia Blood  ATTENDING    Ranga, Simbiso  SONOGRAPHER  Georgian Co, RDCS, CCT  Romero Liner cc:  ------------------------------------------------------------ LV EF: 35% -   40%  ------------------------------------------------------------ Indications:      MI - follow-up 410.92.  ------------------------------------------------------------ Study Conclusions  - Left ventricle: The cavity size was mildly dilated.   Systolic function was moderately reduced. The estimated   ejection fraction was in the range of 35% to 40%. Diffuse   hypokinesis. Severe hypokinesis of the inferior   myocardium. - Aortic valve: Mild regurgitation. - Mitral valve: Moderate to severe regurgitation. - Left atrium: The atrium was moderately dilated. - Pulmonary arteries: Systolic pressure was mildly    increased. Transthoracic echocardiography.  M-mode, limited 2D, limited spectral Doppler, and color Doppler.  Height:  Height: 172.7cm. Height: 68in.  Weight:  Weight: 91.6kg. Weight: 201.6lb.  Body mass index:  BMI: 30.7kg/m^2.  Body surface area:    BSA: 2.15m^2.  Blood pressure:     112/77.  Patient status:  Inpatient.  Location:  Bedside.  ------------------------------------------------------------  ------------------------------------------------------------ Left ventricle:  The cavity size was mildly dilated. Systolic function was moderately reduced. The estimated ejection fraction was in the range of 35% to 40%. Diffuse hypokinesis.  Regional wall motion abnormalities:   Severe hypokinesis of the inferior myocardium.  ------------------------------------------------------------ Aortic valve:   Trileaflet; normal thickness leaflets. Mobility was not restricted.  Doppler:  Transvalvular velocity was within the normal range. There was no stenosis.  Mild regurgitation.  ------------------------------------------------------------ Aorta:  Aortic root: The aortic root was  normal in size.  ------------------------------------------------------------ Mitral valve:   Structurally normal valve.   Mobility was not restricted.  Doppler:  Transvalvular velocity was within the normal range. There was no evidence for stenosis. Moderate to severe regurgitation.  ------------------------------------------------------------ Left atrium:  The atrium was moderately dilated.  ------------------------------------------------------------ Right ventricle:  The cavity size was normal. Wall thickness was normal. Systolic function was normal.  ------------------------------------------------------------ Pulmonic valve:    Doppler:  Transvalvular velocity was within the normal range. There was no evidence for stenosis.  Mild  regurgitation.  ------------------------------------------------------------ Tricuspid valve:   Structurally normal valve.    Doppler: Transvalvular velocity was within the normal range.  Mild regurgitation.  ------------------------------------------------------------ Pulmonary artery:   The main pulmonary artery was normal-sized. Systolic pressure was mildly increased.  ------------------------------------------------------------ Right atrium:  The atrium was normal in size.  ------------------------------------------------------------ Pericardium:  There was no pericardial effusion.  ------------------------------------------------------------ Systemic veins: Inferior vena cava: The vessel was normal in size.  ------------------------------------------------------------  2D measurements        Normal  Doppler measurements   Normal Left ventricle                 Mitral valve LVID ED,   56.5 mm     43-52   Regurg      38.5 cm/s  ------ chord,                         alias vel, PLAX                           PISA LVID ES,   45.1 mm     23-38   Max regurg   480 cm/s  ------ chord,                         vel PLAX                           Regurg VTI   128 cm    ------ FS, chord,   20 %      >29     ERO, PISA   0.08 cm^2  ------ PLAX                           Regurg        10 ml    ------ LVPW, ED   10.2 mm     ------  vol, PISA IVS/LVPW   1.21        <1.3 ratio, ED Vol ED,     141 ml     ------ MOD1 Vol ES,      88 ml     ------ MOD1 EF, MOD1     38 %      ------ Vol index,   69 ml/m^2 ------ ED, MOD1 Vol index,   43 ml/m^2 ------ ES, MOD1 Vol ED,     147 ml     ------ MOD2 Vol ES,      91 ml     ------ MOD2 EF, MOD2     38 %      ------ Stroke       56 ml     ------ vol, MOD2 Vol index,   72 ml/m^2 ------ ED, MOD2 Vol index,   44 ml/m^2 ------ ES, MOD2 Stroke  27.3 ml/m^2 ------ index, MOD2 Ventricular septum IVS, ED    12.3 mm     ------ Aorta Root  diam,   33 mm     ------ ED Left atrium AP dim       50 mm     ------ AP dim     2.44 cm/m^2 <2.2 index Right ventricle RVID ED,   29.2 mm     19-38 PLAX   ------------------------------------------------------------ Prepared and Electronically Authenticated by  Georga Hacking, MD [facility] [id number]:42:49.050 Findings:  On milrinone 0.25 mg.kg/min  RA = 5   RV = 30/5/7 PA = 30/16 (23) PCW = 20 (no significant v-waves) Fick cardiac output/index = 5.46/2.8 PVR = 0.5 Woods SVR = 981   FA sat = 93% PA sat = 67%, 71% (on milrinone)  Ao Pressure: 92/63 (76) LV Pressure: 98/8/18   There was no signficant gradient across the aortic valve on pullback.  Left main: 80-90% distal  LAD: Flush occlusion at ostium. Mild filling in mid and distal segments through L to L and R to L collaterals  LCX: Large. Dominant. 70-80% mid AV groove. OM-1 small to moderate vessel with mild ostial disease. OM-2 very large 99% lesion in mid section at trifurcation. 90% lesion in lowest branch. PDA 50-60 ostial  RCA: Small to moderate-sized, non- dominant vessel ending with acute marginal branch. Diffuse 60% prox to mid. 95% midsection. R to L collats to LAD from acute marginal.  LV-gram done in the RAO projection: Ejection fraction = 25-30% with global HK 3+ MR  L subclavian: Widely patent to chest wall with mild plaquing  Assessment:  1. Severe 3v CAD including high-grade ostial LM disease 2. Ischemic CM with EF 25-30% 3. 3+ mitral regurgitation 4. Well compensated hemodynamics on milrinone  Plan/Discussion:  Will need CABG/MVR. Check PFTs and TEE. Continue milrinone. Transfer stepdown. Consult TCTS.   Arvilla Meres, MD 9:29 AM   Assessment/Plan:  He has severe multivessel and left main coronary disease with ischemic cardiomyopathy and moderate to severe mitral regurgitation that is probably due to left ventricular dilatation. His symptoms and hemodynamics have improved on  milrinone. He was on Plavix until today which has been discontinued. I agree that he will require coronary bypass graft surgery and mitral valve repair. He is scheduled for a TEE tomorrow. I will plan to do his surgery on Friday this week. I discussed the operative procedure with the patient and family including alternatives, benefits and risks; including but not limited to bleeding, blood transfusion, infection, stroke, myocardial infarction, graft failure, heart block requiring a permanent pacemaker, organ dysfunction, and death.  Drue Stager Tarter understands and agrees to proceed.    Alleen Borne 10/16/2011, 6:29 PM

## 2011-10-16 NOTE — H&P (View-Only) (Signed)
Subjective:   Charles Savage is an 56 y.o. male followed by Dr. Donnie Aho. He has h/o of ETOH and tobacco use. He was apparently doing OK until he had a stroke in February 2013, treated by L carotid stent at [facility].   He was admitted to [facility] in 3/17-3/27 with respiratory failure associated with COPD and healthcare associated pneumonia Hospitalization complicated by as well as non-ST EMI (peak Troponin ~2), and acute renal failure with ATN. Echo at that time EF 40-45% with global HK and inferior akinesis and severe posterior MR. Managed medically due to renal failure.   Readmitted 4/4-4/8 with recurrent SOB and renal failure thought due to persistent PNA.  Echo repeated EF 35-40% with moderate to severe MR   Readmitted to [facility] 4/15  With systolic HF and hypotension. CE normal. Weight down about 12 pounds since admit. Cr initially 1.8 now 1.4. SBP 95-110. ECG (which I reviewed personally) ST 110 nonspecific TWA. CXR with stable patchy airspace disease suggestive of PNA. Effusions resolving. On avelox/cefepime  Started on milrinone yesterday for presumed low output in setting of probable ischemic cardiomyopathy and MR. Renal function much improved. Breathing better.  No CP. Brief run SVT this am on tele.  Intake/Output Summary (Last 24 hours) at 10/15/11 1116 Last data filed at 10/15/11 1100  Gross per 24 hour  Intake 950.32 ml  Output   3425 ml  Net -2474.68 ml    Current meds:    . antiseptic oral rinse  15 mL Mouth Rinse BID  . aspirin EC  81 mg Oral Daily  . ceFEPime (MAXIPIME) IV  1 g Intravenous Q12H  . digoxin  0.125 mg Oral Daily  . docusate sodium  100 mg Oral BID  . enoxaparin  40 mg Subcutaneous Q24H  . furosemide  40 mg Intravenous Daily  . lisinopril  2.5 mg Oral Daily  . moxifloxacin  400 mg Oral q1800  . nicotine  14 mg Transdermal Daily  . potassium chloride  40 mEq Oral BID  . sodium chloride  3 mL Intravenous Q12H  . sodium chloride  3 mL Intravenous  Q12H  . DISCONTD: clopidogrel  75 mg Oral Q breakfast   Infusions:    . milrinone 0.25 mcg/kg/min (10/14/11 1550)     Objective:  Blood pressure 111/67, pulse 105, temperature 97.4 F (36.3 C), temperature source Oral, resp. rate 20, height 5\' 8"  (1.727 m), weight 78.7 kg (173 lb 8 oz), SpO2 95.00%. Weight change: -3.4 kg (-7 lb 7.9 oz)  Physical Exam: General:  Mildly chronically ill appearing. No acute distress. HEENT: normal Neck: supple. JVP 6-7 . Carotids 1+ bialterally No lymphadenopathy or thryomegaly appreciated. Cor: PMI nonpalpable  Regular rate & rhythm. 2/6 MR Lungs: decreased throughout otherwise clear Abdomen: soft, nontender, nondistended. No hepatosplenomegaly. No bruits or masses. Good bowel sounds. Large ecchymosis LLQ Extremities: no cyanosis, clubbing, rash, edema Neuro: alert & orientedx3, cranial nerves grossly intact. moves all 4 extremities w/o difficulty. Affect pleasant  Telemetry:  ST 105 with brief run SVT  Lab Results: Basic Metabolic Panel:  Lab 10/15/11 4098 10/13/11 0532 10/12/11 0537 10/11/11 0543 10/10/11 0500  NA 141 136 132* 134* 133*  K 3.1* 3.8 -- -- --  CL 101 98 95* 96 94*  CO2 29 30 26 27 23   GLUCOSE 100* 136* 111* 128* 216*  BUN 14 35* 51* 45* 32*  CREATININE 1.01 1.42* 1.84* 1.70* 1.33  CALCIUM 8.7 8.8 8.5 8.4 9.2  MG -- -- -- -- --  PHOS -- -- -- -- --   Liver Function Tests:  Lab 10/10/11 0500 10/09/11 2011  AST 47* 58*  ALT 52 56*  ALKPHOS 102 111  BILITOT 0.8 1.0  PROT 7.4 7.8  ALBUMIN 3.5 3.9   No results found for this basename: LIPASE:5,AMYLASE:5 in the last 168 hours No results found for this basename: AMMONIA:5 in the last 168 hours CBC:  Lab 10/13/11 0532 10/12/11 0537 10/10/11 0500 10/09/11 2011  WBC 11.0* 13.4* 7.2 13.2*  NEUTROABS -- -- -- 7.3  HGB 12.3* 12.5* 12.2* 13.3  HCT 38.0* 38.1* 37.8* 40.5  MCV 94.5 93.2 93.1 93.3  PLT 257 240 252 271   Cardiac Enzymes:  Lab 10/10/11 1743 10/10/11 0941  10/10/11 0205  CKTOTAL 58 55 62  CKMB 4.0 4.1* 4.3*  CKMBINDEX -- -- --  TROPONINI <0.30 <0.30 <0.30   BNP: No components found with this basename: POCBNP:5 CBG: No results found for this basename: GLUCAP:5 in the last 168 hours Microbiology: Lab Results  Component Value Date   CULT NO GROWTH 10/09/2011   CULT NORMAL OROPHARYNGEAL FLORA 09/14/2011   CULT NO GROWTH 5 DAYS 09/10/2011   CULT NO GROWTH 5 DAYS 09/10/2011    Lab 10/09/11 2222  CULT NO GROWTH  SDES URINE, CLEAN CATCH    Imaging: No results found.   ASSESSMENT:  1. A/c systolic CHF 2. Ischemic CM EF 35-40% 3. Moderate to severe mitral regurgitation 4. A/C respiratory failure  5. COPD 6. CVA 2/13 - s/p L carotid stent on Plavix now 7. Recent PNA 8. A/C renal failure 9. H/o ETOH and tobacco abuse, now quit 10. SVT, brief  PLAN/DISCUSSION:  Although posterior leaflet of MV not overly restricted my suspicion is that he has severe ischemic MR in the setting of iCM. He is improved on milrinone.  Will change to po lasix. Schedule R/L cath for tomorrow. Will also likelly need TEE. Will hold Plavix for now as i suspect he may need CABG +/- MVR.   Start b-blocker.   LOS: 6 days    Arvilla Meres, MD 04/17/2012, 11:16 AM

## 2011-10-16 NOTE — Interval H&P Note (Signed)
History and Physical Interval Note:  10/16/2011 8:56 AM  Charles Savage  has presented today for surgery, with the diagnosis of chf, NSTEMI and MR  The various methods of treatment have been discussed with the patient and family. After consideration of risks, benefits and other options for treatment, the patient has consented to  Procedure(s) (LRB): LEFT AND RIGHT HEART CATHETERIZATION WITH CORONARY ANGIOGRAM (N/A) as a surgical intervention .  The patients' history has been reviewed, patient examined, no change in status, stable for surgery.  I have reviewed the patients' chart and labs.  Questions were answered to the patient's satisfaction.     Thelmer Legler

## 2011-10-17 ENCOUNTER — Inpatient Hospital Stay (HOSPITAL_COMMUNITY): Payer: Medicaid Other

## 2011-10-17 ENCOUNTER — Encounter (HOSPITAL_COMMUNITY): Payer: Self-pay | Admitting: *Deleted

## 2011-10-17 ENCOUNTER — Encounter (HOSPITAL_COMMUNITY)
Admission: EM | Disposition: A | Discharge status: Home / Self Care | Payer: Self-pay | Source: Home / Self Care | Attending: Internal Medicine

## 2011-10-17 DIAGNOSIS — I059 Rheumatic mitral valve disease, unspecified: Secondary | ICD-10-CM

## 2011-10-17 HISTORY — PX: TEE WITHOUT CARDIOVERSION: SHX5443

## 2011-10-17 LAB — PULMONARY FUNCTION TEST

## 2011-10-17 LAB — BASIC METABOLIC PANEL
CO2: 27 mEq/L (ref 19–32)
Calcium: 8.9 mg/dL (ref 8.4–10.5)
Creatinine, Ser: 0.9 mg/dL (ref 0.50–1.35)
GFR calc Af Amer: >90 mL/min (ref >90)
GFR calc non Af Amer: >90 mL/min (ref >90)
Sodium: 139 mEq/L (ref 135–145)

## 2011-10-17 SURGERY — ECHOCARDIOGRAM, TRANSESOPHAGEAL
Anesthesia: Moderate Sedation

## 2011-10-17 MED ORDER — BENZOCAINE 20 % MT SOLN
1.0000 "application " | OROMUCOSAL | Status: DC | PRN
  Filled 2011-10-17: qty 57

## 2011-10-17 MED ORDER — FENTANYL CITRATE 0.05 MG/ML IJ SOLN
INTRAMUSCULAR | Status: DC | PRN
  Administered 2011-10-17: 50 ug via INTRAVENOUS

## 2011-10-17 MED ORDER — FENTANYL CITRATE 0.05 MG/ML IJ SOLN
INTRAMUSCULAR | Status: AC
  Filled 2011-10-17: qty 4

## 2011-10-17 MED ORDER — SODIUM CHLORIDE 0.9 % IV SOLN
INTRAVENOUS | Status: DC
  Administered 2011-10-17 (×2): via INTRAVENOUS

## 2011-10-17 MED ORDER — DEXTROSE 5 % IV SOLN
1.0000 g | Freq: Two times a day (BID) | INTRAVENOUS | Status: DC
  Administered 2011-10-17: 1 g via INTRAVENOUS
  Filled 2011-10-17 (×2): qty 1

## 2011-10-17 MED ORDER — SODIUM CHLORIDE 0.9 % IJ SOLN
3.0000 mL | Freq: Two times a day (BID) | INTRAMUSCULAR | Status: DC
  Administered 2011-10-17: 3 mL via INTRAVENOUS

## 2011-10-17 MED ORDER — MIDAZOLAM HCL 10 MG/2ML IJ SOLN
INTRAMUSCULAR | Status: DC | PRN
  Administered 2011-10-17 (×2): 2 mg via INTRAVENOUS

## 2011-10-17 MED ORDER — LIDOCAINE VISCOUS 2 % MT SOLN
OROMUCOSAL | Status: AC
  Filled 2011-10-17: qty 15

## 2011-10-17 MED ORDER — SODIUM CHLORIDE 0.9 % IV SOLN: 250.0000 mL | INTRAVENOUS | Status: DC | PRN

## 2011-10-17 MED ORDER — FENTANYL CITRATE 0.05 MG/ML IJ SOLN: 250.0000 ug | Freq: Once | INTRAMUSCULAR | Status: DC

## 2011-10-17 MED ORDER — MIDAZOLAM HCL 10 MG/2ML IJ SOLN
INTRAMUSCULAR | Status: AC
  Filled 2011-10-17: qty 4

## 2011-10-17 MED ORDER — SODIUM CHLORIDE 0.9 % IJ SOLN: 3.0000 mL | INTRAMUSCULAR | Status: DC | PRN

## 2011-10-17 MED ORDER — SODIUM CHLORIDE 0.45 % IV SOLN: INTRAVENOUS | Status: DC

## 2011-10-17 MED ORDER — MIDAZOLAM HCL 10 MG/2ML IJ SOLN: 10.0000 mg | Freq: Once | INTRAMUSCULAR | Status: DC

## 2011-10-17 MED ORDER — BUTAMBEN-TETRACAINE-BENZOCAINE 2-2-14 % EX AERO: INHALATION_SPRAY | CUTANEOUS | Status: DC | PRN

## 2011-10-17 MED ORDER — LIDOCAINE VISCOUS 2 % MT SOLN
OROMUCOSAL | Status: DC | PRN
  Administered 2011-10-17: 10 mL via OROMUCOSAL

## 2011-10-17 NOTE — Interval H&P Note (Signed)
History and Physical Interval Note:  10/17/2011 2:27 PM  Charles Savage  has presented today for surgery, with the diagnosis of CHF, CAD, mitral regurgitation The various methods of treatment have been discussed with the patient and family. After consideration of risks, benefits and other options for treatment, the patient has consented to  Procedure(s) (LRB): TRANSESOPHAGEAL ECHOCARDIOGRAM (TEE) (N/A) as a surgical intervention .  The patients' history has been reviewed, patient examined, no change in status, stable for surgery.  I have reviewed the patients' chart and labs.  Questions were answered to the patient's satisfaction.     [name]

## 2011-10-17 NOTE — Progress Notes (Signed)
Dr Gala Romney notified of low b/p coreg held per his request. Pt stable no c/o asymptomatic

## 2011-10-17 NOTE — H&P (View-Only) (Signed)
Advanced Heart Failure Rounding Note   Subjective:    Charles Savage is an 56 y.o. male followed by Dr. Donnie Aho. He has h/o of ETOH and tobacco use. He was apparently doing OK until he had a stroke in February 2013, treated by L CEA at [facility].   He was admitted to [facility] in 3/17-3/27 with respiratory failure associated with COPD and healthcare associated pneumonia Hospitalization complicated by as well as non-ST EMI (peak Troponin ~2), and acute renal failure with ATN. Echo at that time EF 40-45% with global HK and inferior akinesis and severe posterior MR. Managed medically due to renal failure.   Readmitted 4/4-4/8 with recurrent SOB and renal failure thought due to persistent PNA. Echo repeated EF 35-40% with moderate to severe MR.  Readmitted to [facility] 4/15 With systolic HF and hypotension. CE normal. Weight down about 12 pounds since admit. Cr initially 1.8 now 1.4. SBP 95-110. ECG (which I reviewed personally) ST 110 nonspecific TWA. CXR with stable patchy airspace disease suggestive of PNA. Effusions resolving. Day # 7 avelox/cefepime  10/16/11 RHC/LHC On milrinone 0.25 mg.kg/min  RA = 5  RV = 30/5/7  PA = 30/16 (23)  PCW = 20 (no significant v-waves)  Fick cardiac output/index = 5.46/2.8  PVR = 0.5 Woods  SVR = 981  FA sat = 93%  PA sat = 67%, 71% (on milrinone)  Ao Pressure: 92/63 (76)  LV Pressure: 98/8/18   1. 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 Ischemic CM with EF 25-30%  3. 3+ mitral regurgitation  Evaluated by Dr Laneta Simmers. Plan for CABG/MVR  4/26.   Denies SOB/PND/CP/Orthopnea. No problems with groin site. For TEE today.        Objective:   Weight Range:  Vital Signs:   Temp:  [97.4 F (36.3 C)-97.9 F (36.6 C)] 97.8 F (36.6 C) (04/23 0729) Pulse Rate:  [92-114] 104  (04/23 0729) Resp:  [14-25] 23  (04/23 0729) BP: (95-120)/(57-77) 116/72 mmHg (04/23 0729) SpO2:  [91 %-98 %] 95 % (04/23 0729) Weight:  [78.8 kg (173 lb 11.6  oz)-79.1 kg (174 lb 6.1 oz)] 78.8 kg (173 lb 11.6 oz) (04/23 0433) Last BM Date: 10/14/11  Weight change: Filed Weights   10/16/11 0519 10/16/11 1600 10/17/11 0433  Weight: 77.6 kg (171 lb 1.2 oz) 79.1 kg (174 lb 6.1 oz) 78.8 kg (173 lb 11.6 oz)    Intake/Output:   Intake/Output Summary (Last 24 hours) at 10/17/11 0820 Last data filed at 10/17/11 0700  Gross per 24 hour  Intake 215.32 ml  Output    675 ml  Net -459.68 ml     Physical Exam: General:  Well appearing. No resp difficulty HEENT: normal Neck: supple. JVP 6-7 . Carotids 2+ bilat; no bruits. No lymphadenopathy or thryomegaly appreciated. Cor: PMI nondisplaced. Regular rate & rhythm. No rubs, gallops or murmurs. Lungs: clear Abdomen: soft, nontender, nondistended. No hepatosplenomegaly. No bruits or masses. Good bowel sounds. Extremities: no cyanosis, clubbing, rash, edema. R groin ite no hematoma or bruit Neuro: alert & orientedx3, cranial nerves grossly intact. moves all 4 extremities w/o difficulty. Affect pleasant  Telemetry: ST 105  Labs: Basic Metabolic Panel:  Lab 10/16/11 1610 10/16/11 0532 10/15/11 0620 10/13/11 0532 10/12/11 0537 10/11/11 0543  NA -- 139 141 136 132* 134*  K -- 3.6 3.1* 3.8 4.1 3.7  CL -- 99 101 98 95* 96  CO2 -- 29 29 30 26 27   GLUCOSE -- 102* 100*  136* 111* 128*  BUN -- 13 14 35* 51* 45*  CREATININE 1.00 0.99 1.01 1.42* 1.84* --  CALCIUM -- 9.2 8.7 8.8 -- --  MG -- -- -- -- -- --  PHOS -- -- -- -- -- --    Liver Function Tests: No results found for this basename: AST:5,ALT:5,ALKPHOS:5,BILITOT:5,PROT:5,ALBUMIN:5 in the last 168 hours No results found for this basename: LIPASE:5,AMYLASE:5 in the last 168 hours No results found for this basename: AMMONIA:3 in the last 168 hours  CBC:  Lab 10/16/11 1645 10/16/11 0532 10/13/11 0532 10/12/11 0537  WBC 7.3 10.8* 11.0* 13.4*  NEUTROABS -- -- -- --  HGB 13.5 14.4 12.3* 12.5*  HCT 41.6 42.5 38.0* 38.1*  MCV 94.5 92.2 94.5 93.2    PLT 228 249 257 240    Cardiac Enzymes:  Lab 10/10/11 1743 10/10/11 0941  CKTOTAL 58 55  CKMB 4.0 4.1*  CKMBINDEX -- --  TROPONINI <0.30 <0.30    BNP: BNP (last 3 results)  Basename 10/13/11 1041 10/12/11 0537 10/09/11 2011  PROBNP 7418.0* 6773.0* 14487.0*     Other results:  EKG:   Imaging:  No results found.   Medications:     Scheduled Medications:    . antiseptic oral rinse  15 mL Mouth Rinse BID  . aspirin EC  81 mg Oral Daily  . carvedilol  3.125 mg Oral BID WC  . ceFEPime (MAXIPIME) IV  1 g Intravenous Q12H  . digoxin  0.125 mg Oral Daily  . docusate sodium  100 mg Oral BID  . enoxaparin  40 mg Subcutaneous Q24H  . furosemide  40 mg Oral Daily  . heparin      . lidocaine      . lisinopril  2.5 mg Oral Daily  . midazolam      . moxifloxacin  400 mg Oral q1800  . nicotine  14 mg Transdermal Daily  . nitroGLYCERIN      . potassium chloride  40 mEq Oral BID  . sodium chloride  3 mL Intravenous Q12H  . DISCONTD: enoxaparin  40 mg Subcutaneous Q24H  . DISCONTD: sodium chloride  3 mL Intravenous Q12H     Infusions:    . sodium chloride    . sodium chloride 10 mL/hr (10/17/11 0232)  . milrinone 0.25 mcg/kg/min (10/16/11 1530)  . DISCONTD: sodium chloride 75 mL/hr at 10/16/11 0545     PRN Medications:  acetaminophen, acetaminophen, acetaminophen, ALPRAZolam, cyclobenzaprine, HYDROcodone-acetaminophen, levalbuterol, ondansetron (ZOFRAN) IV, ondansetron, DISCONTD: sodium chloride, DISCONTD: ondansetron (ZOFRAN) IV, DISCONTD: sodium chloride   Assessment:  1. A/c systolic CHF  2. Ischemic CM EF 25-30%  3. Moderate to severe mitral regurgitation  4. A/C respiratory failure  5. COPD  6. CVA 2/13 - s/p L CEA on Plavix now  7. Recent PNA  8. A/C renal failure  - resolved 9. H/o ETOH and tobacco abuse, now quit  10. 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 SVT, brief    .    Plan/Discussion:    Volume status stable.  Weight down 17 pounds since admit. Continue Milrinone 0.25 mcg/kg/min. Will check BMET today.   Plan for CABG/MVR 10/20/11 by Dr Laneta Simmers.  Plan for TEE today and order PFTs.   Length of Stay: 8  Amy Clegg NP-C CLEGG,AMY 10/17/2011, 8:20 AM  Patient seen and examined with Tonye Becket, NP. We discussed all aspects of the encounter. I agree with the assessment and plan as stated above. He is much improved clinically.  Volume status well controlled on exam. We reviewed results of his cath. Will plan TEE and PFTs today. Off Plavix. For CABG/MVR on Friday - appreciate Dr. Sharee Pimple assistance.   Thang Flett,MD 2:21 PM

## 2011-10-17 NOTE — Progress Notes (Signed)
  Echocardiogram Echocardiogram Transesophageal has been performed.  Jorje Guild [facility] 10/17/2011, 3:08 PM

## 2011-10-17 NOTE — Progress Notes (Signed)
Pt observed desating  Mid 80's, o2 titrated up from 2 to 4L with sats hovering around 87/88% RT notified. Pt placed on VM 2 50%. Will monitor closely.

## 2011-10-17 NOTE — Progress Notes (Signed)
Advanced Heart Failure Rounding Note   Subjective:    Charles Savage is an 56 y.o. male followed by Dr. Tilley. He has h/o of ETOH and tobacco use. He was apparently doing OK until he had a stroke in February 2013, treated by L CEA at [facility].   He was admitted to [facility] in 3/17-3/27 with respiratory failure associated with COPD and healthcare associated pneumonia Hospitalization complicated by as well as non-ST EMI (peak Troponin ~2), and acute renal failure with ATN. Echo at that time EF 40-45% with global HK and inferior akinesis and severe posterior MR. Managed medically due to renal failure.   Readmitted 4/4-4/8 with recurrent SOB and renal failure thought due to persistent PNA. Echo repeated EF 35-40% with moderate to severe MR.  Readmitted to [facility] 4/15 With systolic HF and hypotension. CE normal. Weight down about 12 pounds since admit. Cr initially 1.8 now 1.4. SBP 95-110. ECG (which I reviewed personally) ST 110 nonspecific TWA. CXR with stable patchy airspace disease suggestive of PNA. Effusions resolving. Day # 7 avelox/cefepime  10/16/11 RHC/LHC On milrinone 0.25 mg.kg/min  RA = 5  RV = 30/5/7  PA = 30/16 (23)  PCW = 20 (no significant v-waves)  Fick cardiac output/index = 5.46/2.8  PVR = 0.5 Woods  SVR = 981  FA sat = 93%  PA sat = 67%, 71% (on milrinone)  Ao Pressure: 92/63 (76)  LV Pressure: 98/8/18   1. 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 Ischemic CM with EF 25-30%  3. 3+ mitral regurgitation  Evaluated by Dr Bartle. Plan for CABG/MVR  4/26.   Denies SOB/PND/CP/Orthopnea. No problems with groin site. For TEE today.        Objective:   Weight Range:  Vital Signs:   Temp:  [97.4 F (36.3 C)-97.9 F (36.6 C)] 97.8 F (36.6 C) (04/23 0729) Pulse Rate:  [92-114] 104  (04/23 0729) Resp:  [14-25] 23  (04/23 0729) BP: (95-120)/(57-77) 116/72 mmHg (04/23 0729) SpO2:  [91 %-98 %] 95 % (04/23 0729) Weight:  [78.8 kg (173 lb 11.6  oz)-79.1 kg (174 lb 6.1 oz)] 78.8 kg (173 lb 11.6 oz) (04/23 0433) Last BM Date: 10/14/11  Weight change: Filed Weights   10/16/11 0519 10/16/11 1600 10/17/11 0433  Weight: 77.6 kg (171 lb 1.2 oz) 79.1 kg (174 lb 6.1 oz) 78.8 kg (173 lb 11.6 oz)    Intake/Output:   Intake/Output Summary (Last 24 hours) at 10/17/11 0820 Last data filed at 10/17/11 0700  Gross per 24 hour  Intake 215.32 ml  Output    675 ml  Net -459.68 ml     Physical Exam: General:  Well appearing. No resp difficulty HEENT: normal Neck: supple. JVP 6-7 . Carotids 2+ bilat; no bruits. No lymphadenopathy or thryomegaly appreciated. Cor: PMI nondisplaced. Regular rate & rhythm. No rubs, gallops or murmurs. Lungs: clear Abdomen: soft, nontender, nondistended. No hepatosplenomegaly. No bruits or masses. Good bowel sounds. Extremities: no cyanosis, clubbing, rash, edema. R groin ite no hematoma or bruit Neuro: alert & orientedx3, cranial nerves grossly intact. moves all 4 extremities w/o difficulty. Affect pleasant  Telemetry: ST 105  Labs: Basic Metabolic Panel:  Lab 10/16/11 1645 10/16/11 0532 10/15/11 0620 10/13/11 0532 10/12/11 0537 10/11/11 0543  NA -- 139 141 136 132* 134*  K -- 3.6 3.1* 3.8 4.1 3.7  CL -- 99 101 98 95* 96  CO2 -- 29 29 30 26 27  GLUCOSE -- 102* 100*   136* 111* 128*  BUN -- 13 14 35* 51* 45*  CREATININE 1.00 0.99 1.01 1.42* 1.84* --  CALCIUM -- 9.2 8.7 8.8 -- --  MG -- -- -- -- -- --  PHOS -- -- -- -- -- --    Liver Function Tests: No results found for this basename: AST:5,ALT:5,ALKPHOS:5,BILITOT:5,PROT:5,ALBUMIN:5 in the last 168 hours No results found for this basename: LIPASE:5,AMYLASE:5 in the last 168 hours No results found for this basename: AMMONIA:3 in the last 168 hours  CBC:  Lab 10/16/11 1645 10/16/11 0532 10/13/11 0532 10/12/11 0537  WBC 7.3 10.8* 11.0* 13.4*  NEUTROABS -- -- -- --  HGB 13.5 14.4 12.3* 12.5*  HCT 41.6 42.5 38.0* 38.1*  MCV 94.5 92.2 94.5 93.2    PLT 228 249 257 240    Cardiac Enzymes:  Lab 10/10/11 1743 10/10/11 0941  CKTOTAL 58 55  CKMB 4.0 4.1*  CKMBINDEX -- --  TROPONINI <0.30 <0.30    BNP: BNP (last 3 results)  Basename 10/13/11 1041 10/12/11 0537 10/09/11 2011  PROBNP 7418.0* 6773.0* 14487.0*     Other results:  EKG:   Imaging:  No results found.   Medications:     Scheduled Medications:    . antiseptic oral rinse  15 mL Mouth Rinse BID  . aspirin EC  81 mg Oral Daily  . carvedilol  3.125 mg Oral BID WC  . ceFEPime (MAXIPIME) IV  1 g Intravenous Q12H  . digoxin  0.125 mg Oral Daily  . docusate sodium  100 mg Oral BID  . enoxaparin  40 mg Subcutaneous Q24H  . furosemide  40 mg Oral Daily  . heparin      . lidocaine      . lisinopril  2.5 mg Oral Daily  . midazolam      . moxifloxacin  400 mg Oral q1800  . nicotine  14 mg Transdermal Daily  . nitroGLYCERIN      . potassium chloride  40 mEq Oral BID  . sodium chloride  3 mL Intravenous Q12H  . DISCONTD: enoxaparin  40 mg Subcutaneous Q24H  . DISCONTD: sodium chloride  3 mL Intravenous Q12H     Infusions:    . sodium chloride    . sodium chloride 10 mL/hr (10/17/11 0232)  . milrinone 0.25 mcg/kg/min (10/16/11 1530)  . DISCONTD: sodium chloride 75 mL/hr at 10/16/11 0545     PRN Medications:  acetaminophen, acetaminophen, acetaminophen, ALPRAZolam, cyclobenzaprine, HYDROcodone-acetaminophen, levalbuterol, ondansetron (ZOFRAN) IV, ondansetron, DISCONTD: sodium chloride, DISCONTD: ondansetron (ZOFRAN) IV, DISCONTD: sodium chloride   Assessment:  1. A/c systolic CHF  2. Ischemic CM EF 25-30%  3. Moderate to severe mitral regurgitation  4. A/C respiratory failure  5. COPD  6. CVA 2/13 - s/p L CEA on Plavix now  7. Recent PNA  8. A/C renal failure  - resolved 9. H/o ETOH and tobacco abuse, now quit  10. 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 SVT, brief    .    Plan/Discussion:    Volume status stable.  Weight down 17 pounds since admit. Continue Milrinone 0.25 mcg/kg/min. Will check BMET today.   Plan for CABG/MVR 10/20/11 by Dr Bartle.  Plan for TEE today and order PFTs.   Length of Stay: 8  Amy Clegg NP-C CLEGG,AMY 10/17/2011, 8:20 AM  Patient seen and examined with Amy Clegg, NP. We discussed all aspects of the encounter. I agree with the assessment and plan as stated above. He is much improved clinically.   Volume status well controlled on exam. We reviewed results of his cath. Will plan TEE and PFTs today. Off Plavix. For CABG/MVR on Friday - appreciate Dr. Bartle's assistance.    ,MD 2:21 PM    

## 2011-10-17 NOTE — CV Procedure (Signed)
    TRANSESOPHAGEAL ECHOCARDIOGRAM   NAME:  Charles Savage   MRN: [id number] DOB:  05-26-1956   ADMIT DATE: 10/09/2011  INDICATIONS:  CAD, CHF, MR   PROCEDURE:   Informed consent was obtained prior to the procedure. The risks, benefits and alternatives for the procedure were discussed and the patient comprehended these risks.  Risks include, but are not limited to, cough, sore throat, vomiting, nausea, somnolence, esophageal and stomach trauma or perforation, bleeding, low blood pressure, aspiration, pneumonia, infection, trauma to the teeth and death.    After a procedural time-out, the patient was given 4 mg versed and 50 mcg fentanyl for moderate sedation.  The oropharynx was anesthetized 10 cc of topical 1% viscous lidocaine.  The transesophageal probe was inserted in the esophagus and stomach without difficulty and multiple views were obtained.    COMPLICATIONS:    There were no immediate complications.  FINDINGS:  LEFT VENTRICLE: EF = 25% Mildly dilated. Global HK.   RIGHT VENTRICLE: Moderately HK  LEFT ATRIUM: Moderately dilated  LEFT ATRIAL APPENDAGE: No thrombus  RIGHT ATRIUM: Normal  AORTIC VALVE:  Trileaflet. Trivial AI. No AS.  MITRAL VALVE:   Structurally normal. Mild to moderate MR.   TRICUSPID VALVE: Normal Trivial TR  PULMONIC VALVE: Normal  INTERATRIAL SEPTUM: No ASD or PFO.  PERICARDIUM: Moderate effusion along RA//RV. No tamponade.  DESCENDING AORTA: Severe plaque.

## 2011-10-18 ENCOUNTER — Encounter (HOSPITAL_COMMUNITY): Payer: Self-pay | Admitting: Internal Medicine

## 2011-10-18 DIAGNOSIS — Z01810 Encounter for preprocedural cardiovascular examination: Secondary | ICD-10-CM

## 2011-10-18 LAB — BASIC METABOLIC PANEL
GFR calc Af Amer: >90 mL/min (ref >90)
GFR calc non Af Amer: >90 mL/min (ref >90)
Glucose, Bld: 100 mg/dL — ABNORMAL HIGH (ref 70–99)
Potassium: 4.4 mEq/L (ref 3.5–5.1)
Sodium: 138 mEq/L (ref 135–145)

## 2011-10-18 MED ORDER — ASPIRIN 81 MG PO CHEW
81.0000 mg | CHEWABLE_TABLET | Freq: Every day | ORAL | Status: DC
  Administered 2011-10-19: 81 mg via ORAL
  Filled 2011-10-18: qty 1

## 2011-10-18 MED ORDER — CARVEDILOL 3.125 MG PO TABS: 3.1250 mg | ORAL_TABLET | Freq: Two times a day (BID) | ORAL | Status: DC

## 2011-10-18 NOTE — Progress Notes (Signed)
Advanced Heart Failure Rounding Note   Subjective:    Charles Savage is an 56 y.o. male followed by Dr. Donnie Aho. He has h/o of ETOH and tobacco use. He was apparently doing OK until he had a stroke in February 2013, treated by L [facility] at [facility].   He was admitted to [facility] in 3/17-3/27 with respiratory failure associated with COPD and healthcare associated pneumonia Hospitalization complicated by as well as non-ST EMI (peak Troponin ~2), and acute renal failure with ATN. Echo at that time EF 40-45% with global HK and inferior akinesis and severe posterior MR. Managed medically due to renal failure.   Readmitted 4/4-4/8 with recurrent SOB and renal failure thought due to persistent PNA. Echo repeated EF 35-40% with moderate to severe MR.  Readmitted to [facility] 4/15 With systolic HF and hypotension. CE normal. Weight down about 12 pounds since admit. Cr initially 1.8 now 1.4. SBP 95-110. ECG (which I reviewed personally) ST 110 nonspecific TWA. CXR with stable patchy airspace disease suggestive of PNA. Effusions resolving. Day # 7 avelox/cefepime  10/16/11 RHC/LHC On milrinone 0.25 mg.kg/min  RA = 5  RV = 30/5/7  PA = 30/16 (23)  PCW = 20 (no significant v-waves)  Fick cardiac output/index = 5.46/2.8  PVR = 0.5 Woods  SVR = 981  FA sat = 93%  PA sat = 67%, 71% (on milrinone)  Ao Pressure: 92/63 (76)  LV Pressure: 98/8/18   1. 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 Ischemic CM with EF 25-30%  3. 3+ mitral regurgitation  10/17/11 TEE LEFT VENTRICLE: EF = 25% Mildly dilated. Global HK.  RIGHT VENTRICLE: Moderately HK  LEFT ATRIUM: Moderately dilated  LEFT ATRIAL APPENDAGE: No thrombus  RIGHT ATRIUM: Normal  AORTIC VALVE: Trileaflet. Trivial AI. No AS.  MITRAL VALVE: Structurally normal. Mild to moderate MR.  TRICUSPID VALVE: Normal Trivial TR  PULMONIC VALVE: Normal  INTERATRIAL SEPTUM: No ASD or PFO.  PERICARDIUM: Moderate effusion along RA//RV. No  tamponade.  DESCENDING AORTA: Severe plaque.  PFTs completed yesterday. Carvedilol held last night due to hypotension. 24 hour I/O -907.   Denies SOB/PND/Orthopnea.        Objective:   Weight Range:  Vital Signs:   Temp:  [97.6 F (36.4 C)-98.6 F (37 C)] 98.6 F (37 C) (04/24 0400) Pulse Rate:  [94-116] 107  (04/24 0400) Resp:  [15-82] 20  (04/24 0350) BP: (65-107)/(40-71) 98/57 mmHg (04/24 0350) SpO2:  [87 %-95 %] 93 % (04/24 0350) Weight:  [77.6 kg (171 lb 1.2 oz)] 77.6 kg (171 lb 1.2 oz) (04/24 0437) Last BM Date: 10/16/11  Weight change: Filed Weights   10/16/11 1600 10/17/11 0433 10/18/11 0437  Weight: 79.1 kg (174 lb 6.1 oz) 78.8 kg (173 lb 11.6 oz) 77.6 kg (171 lb 1.2 oz)    Intake/Output:   Intake/Output Summary (Last 24 hours) at 10/18/11 0742 Last data filed at 10/18/11 0600  Gross per 24 hour  Intake  490.3 ml  Output   1400 ml  Net -909.7 ml     Physical Exam: General:  Well appearing. No resp difficulty HEENT: normal Neck: supple. JVP 5-6. Carotids 2+ bilat; no bruits. No lymphadenopathy or thryomegaly appreciated. Cor: PMI nondisplaced. Regular rate & rhythm. No rubs, gallops or murmurs. Lungs: clear Abdomen: soft, nontender, nondistended. No hepatosplenomegaly. No bruits or masses. Good bowel sounds. Large ecchymosis LLQ Extremities: no cyanosis, clubbing, rash, edema. R groin site no hematoma or bruit Neuro: alert &  orientedx3, cranial nerves grossly intact. moves all 4 extremities w/o difficulty. Affect pleasant  Telemetry: SR  Labs: Basic Metabolic Panel:  Lab 10/18/11 1610 10/17/11 1023 10/16/11 1645 10/16/11 0532 10/15/11 0620 10/13/11 0532  NA 138 139 -- 139 141 136  K 4.4 4.4 -- 3.6 3.1* 3.8  CL 103 104 -- 99 101 98  CO2 26 27 -- 29 29 30   GLUCOSE 100* 107* -- 102* 100* 136*  BUN 16 13 -- 13 14 35*  CREATININE 0.97 0.90 1.00 0.99 1.01 --  CALCIUM 8.7 8.9 -- 9.2 -- --  MG -- -- -- -- -- --  PHOS -- -- -- -- -- --    Liver  Function Tests: No results found for this basename: AST:5,ALT:5,ALKPHOS:5,BILITOT:5,PROT:5,ALBUMIN:5 in the last 168 hours No results found for this basename: LIPASE:5,AMYLASE:5 in the last 168 hours No results found for this basename: AMMONIA:3 in the last 168 hours  CBC:  Lab 10/16/11 1645 10/16/11 0532 10/13/11 0532 10/12/11 0537  WBC 7.3 10.8* 11.0* 13.4*  NEUTROABS -- -- -- --  HGB 13.5 14.4 12.3* 12.5*  HCT 41.6 42.5 38.0* 38.1*  MCV 94.5 92.2 94.5 93.2  PLT 228 249 257 240    Cardiac Enzymes: No results found for this basename: CKTOTAL:5,CKMB:5,CKMBINDEX:5,TROPONINI:5 in the last 168 hours  BNP: BNP (last 3 results)  Basename 10/13/11 1041 10/12/11 0537 10/09/11 2011  PROBNP 7418.0* 6773.0* 14487.0*     Other results:  EKG:   Imaging: No results found.   Medications:     Scheduled Medications:    . antiseptic oral rinse  15 mL Mouth Rinse BID  . aspirin EC  81 mg Oral Daily  . digoxin  0.125 mg Oral Daily  . docusate sodium  100 mg Oral BID  . enoxaparin  40 mg Subcutaneous Q24H  . furosemide  40 mg Oral Daily  . lisinopril  2.5 mg Oral Daily  . moxifloxacin  400 mg Oral q1800  . nicotine  14 mg Transdermal Daily  . potassium chloride  40 mEq Oral BID  . sodium chloride  3 mL Intravenous Q12H  . DISCONTD: carvedilol  3.125 mg Oral BID WC  . DISCONTD: ceFEPime (MAXIPIME) IV  1 g Intravenous Q12H  . DISCONTD: ceFEPime (MAXIPIME) IV  1 g Intravenous Q12H  . DISCONTD: fentaNYL  250 mcg Intravenous Once  . DISCONTD: midazolam  10 mg Intravenous Once  . DISCONTD: sodium chloride  3 mL Intravenous Q12H    Infusions:    . sodium chloride 10 mL/hr at 10/17/11 2038  . milrinone 0.25 mcg/kg/min (10/18/11 0035)  . DISCONTD: sodium chloride      PRN Medications: acetaminophen, acetaminophen, acetaminophen, ALPRAZolam, cyclobenzaprine, HYDROcodone-acetaminophen, levalbuterol, lidocaine, ondansetron (ZOFRAN) IV, ondansetron, DISCONTD: sodium chloride,  DISCONTD: benzocaine, DISCONTD: butamben-tetracaine-benzocaine, DISCONTD: fentaNYL, DISCONTD: midazolam, DISCONTD: sodium chloride   Assessment:  1. A/c systolic CHF  2. Ischemic CM EF 25-30%  3. Moderate to severe mitral regurgitation  4. A/C respiratory failure  5. COPD  6. CVA 2/13 - s/p L CEA on Plavix now  7. Recent PNA  8. A/C renal failure  - resolved 9. H/o ETOH and tobacco abuse, now quit  10. 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 SVT, brief    .    Plan/Discussion:    Volume status stable. Weight down 19 pounds since admit. Continue Milrinone 0.25 mcg/kg/min. Restart Carvedilol 3.125 mg twice a day and hold for SBP <90.   Plan for CABG/MVR 10/20/11 by Dr  Bartle.     Length of Stay: 9  Amy Clegg NP-C CLEGG,AMY 10/18/2011, 7:42 AM  Patient seen and examined with Tonye Becket, NP. We discussed all aspects of the encounter. I agree with the assessment and plan as stated above. Clinically doing very well. No HF on exam. Renal function stable. MR was mild to moderate on TEE yesterday so may not need MVR - will discuss with Dr. Laneta Simmers. Carotid u/s without significant stenoses.  Would continue abx and milrinone up until surgery on Friday.   Wen Munford,MD 3:17 PM

## 2011-10-18 NOTE — Progress Notes (Addendum)
VASCULAR LAB PRELIMINARY  PRELIMINARY  PRELIMINARY  PRELIMINARY  Pre-op Cardiac Surgery  Carotid Findings:  Bilateral:  No evidence of hemodynamically significant internal carotid artery stenosis.   Left CEA patent.  Vertebral artery flow is antegrade.        Upper Extremity Right Left  Brachial Pressures 100 tri 97 tri  Radial Waveforms tri tri  Ulnar Waveforms tri tri  Palmar Arch (Allen's Test) Doppler obliterates with radial compression, normal with ulnar compression. Doppler normal with radial compression, decreases 50% with ulnar compression.    Lower  Extremity Right Left  Dorsalis Pedis    Anterior Tibial Bilateral palpable pulses.   Posterior Tibial    Ankle/Brachial Indices      Terance Hart, RVT 10/18/2011, 10:34 AM

## 2011-10-19 ENCOUNTER — Inpatient Hospital Stay (HOSPITAL_COMMUNITY): Payer: Medicaid Other

## 2011-10-19 LAB — URINE MICROSCOPIC-ADD ON

## 2011-10-19 LAB — BASIC METABOLIC PANEL
Chloride: 104 mEq/L (ref 96–112)
GFR calc Af Amer: >90 mL/min (ref >90)
Potassium: 4.2 mEq/L (ref 3.5–5.1)
Sodium: 138 mEq/L (ref 135–145)

## 2011-10-19 LAB — URINALYSIS, ROUTINE W REFLEX MICROSCOPIC
Ketones, ur: NEGATIVE mg/dL
Leukocytes, UA: NEGATIVE
Protein, ur: 30 mg/dL — AB
Urobilinogen, UA: 0.2 mg/dL (ref 0.0–1.0)

## 2011-10-19 LAB — SURGICAL PCR SCREEN
MRSA, PCR: NEGATIVE
Staphylococcus aureus: NEGATIVE

## 2011-10-19 LAB — PROTIME-INR
INR: 1.13 (ref 0.00–1.49)
Prothrombin Time: 14.7 seconds (ref 11.6–15.2)

## 2011-10-19 MED ORDER — DOPAMINE-DEXTROSE 3.2-5 MG/ML-% IV SOLN
2.0000 ug/kg/min | INTRAVENOUS | Status: DC
  Filled 2011-10-19: qty 250

## 2011-10-19 MED ORDER — CHLORHEXIDINE GLUCONATE 4 % EX LIQD
60.0000 mL | Freq: Once | CUTANEOUS | Status: AC
  Administered 2011-10-19: 4 via TOPICAL
  Filled 2011-10-19: qty 60

## 2011-10-19 MED ORDER — TRANEXAMIC ACID (OHS) PUMP PRIME SOLUTION
2.0000 mg/kg | INTRAVENOUS | Status: DC
  Filled 2011-10-19: qty 1.53

## 2011-10-19 MED ORDER — ALPRAZOLAM 0.25 MG PO TABS: 0.2500 mg | ORAL_TABLET | ORAL | Status: DC | PRN

## 2011-10-19 MED ORDER — FENTANYL CITRATE 0.05 MG/ML IJ SOLN: 50.0000 ug | INTRAMUSCULAR | Status: DC | PRN

## 2011-10-19 MED ORDER — EPINEPHRINE HCL 1 MG/ML IJ SOLN
0.5000 ug/min | INTRAVENOUS | Status: DC
  Filled 2011-10-19: qty 4

## 2011-10-19 MED ORDER — BISACODYL 5 MG PO TBEC: 5.0000 mg | DELAYED_RELEASE_TABLET | Freq: Once | ORAL | Status: DC

## 2011-10-19 MED ORDER — SODIUM CHLORIDE 0.9 % IV SOLN
0.1000 ug/kg/h | INTRAVENOUS | Status: DC
  Filled 2011-10-19: qty 4

## 2011-10-19 MED ORDER — DEXTROSE 5 % IV SOLN
750.0000 mg | INTRAVENOUS | Status: DC
  Filled 2011-10-19: qty 750

## 2011-10-19 MED ORDER — CHLORHEXIDINE GLUCONATE 4 % EX LIQD
60.0000 mL | Freq: Once | CUTANEOUS | Status: AC
  Administered 2011-10-19: 4 via TOPICAL

## 2011-10-19 MED ORDER — NITROGLYCERIN IN D5W 200-5 MCG/ML-% IV SOLN
2.0000 ug/min | INTRAVENOUS | Status: DC
  Filled 2011-10-19: qty 250

## 2011-10-19 MED ORDER — DIAZEPAM 5 MG PO TABS
5.0000 mg | ORAL_TABLET | Freq: Once | ORAL | Status: AC
  Administered 2011-10-20: 5 mg via ORAL
  Filled 2011-10-19: qty 1

## 2011-10-19 MED ORDER — DEXTROSE 5 % IV SOLN
1.5000 g | INTRAVENOUS | Status: AC
  Administered 2011-10-20: .75 g via INTRAVENOUS
  Administered 2011-10-20: 1.5 g via INTRAVENOUS
  Filled 2011-10-19: qty 1.5

## 2011-10-19 MED ORDER — MIDAZOLAM HCL 2 MG/2ML IJ SOLN: 1.0000 mg | INTRAMUSCULAR | Status: DC | PRN

## 2011-10-19 MED ORDER — CHLORHEXIDINE GLUCONATE 4 % EX LIQD
60.0000 mL | Freq: Once | CUTANEOUS | Status: AC
  Administered 2011-10-20: 4 via TOPICAL
  Filled 2011-10-19: qty 60

## 2011-10-19 MED ORDER — TRANEXAMIC ACID (OHS) BOLUS VIA INFUSION
15.0000 mg/kg | INTRAVENOUS | Status: AC
  Administered 2011-10-20: 1149 mg via INTRAVENOUS
  Filled 2011-10-19: qty 1149

## 2011-10-19 MED ORDER — POTASSIUM CHLORIDE 2 MEQ/ML IV SOLN
80.0000 meq | INTRAVENOUS | Status: DC
  Filled 2011-10-19: qty 40

## 2011-10-19 MED ORDER — TEMAZEPAM 15 MG PO CAPS
15.0000 mg | ORAL_CAPSULE | Freq: Once | ORAL | Status: AC | PRN
  Administered 2011-10-19: 15 mg via ORAL
  Filled 2011-10-19: qty 1

## 2011-10-19 MED ORDER — SODIUM BICARBONATE 8.4 % IV SOLN
INTRAVENOUS | Status: AC
  Administered 2011-10-20: 11:00:00
  Filled 2011-10-19: qty 2.5

## 2011-10-19 MED ORDER — TRANEXAMIC ACID 100 MG/ML IV SOLN
1.5000 mg/kg/h | INTRAVENOUS | Status: AC
  Administered 2011-10-20: 1.5 mg/kg/h via INTRAVENOUS
  Filled 2011-10-19: qty 25

## 2011-10-19 MED ORDER — PHENYLEPHRINE HCL 10 MG/ML IJ SOLN
30.0000 ug/min | INTRAVENOUS | Status: DC
  Filled 2011-10-19: qty 2

## 2011-10-19 MED ORDER — VANCOMYCIN HCL 1000 MG IV SOLR
1250.0000 mg | INTRAVENOUS | Status: AC
  Administered 2011-10-20: 1250 mg via INTRAVENOUS
  Filled 2011-10-19: qty 1250

## 2011-10-19 MED ORDER — INSULIN REGULAR HUMAN 100 UNIT/ML IJ SOLN
INTRAMUSCULAR | Status: DC
  Filled 2011-10-19: qty 1

## 2011-10-19 MED ORDER — MAGNESIUM SULFATE 50 % IJ SOLN
40.0000 meq | INTRAMUSCULAR | Status: DC
  Filled 2011-10-19: qty 10

## 2011-10-19 MED ORDER — LACTATED RINGERS IV SOLN
INTRAVENOUS | Status: DC
  Administered 2011-10-19 – 2011-10-20 (×2): via INTRAVENOUS

## 2011-10-19 NOTE — Progress Notes (Signed)
2 Days Post-Op Procedure(s) (LRB): TRANSESOPHAGEAL ECHOCARDIOGRAM (TEE) (N/A) Subjective: Feels better. Off oxygen. No SOB or chest pain  Objective: Vital signs in last 24 hours: Temp:  [97.4 F (36.3 C)-98.9 F (37.2 C)] 97.7 F (36.5 C) (04/25 1200) Pulse Rate:  [97-104] 103  (04/25 1200) Cardiac Rhythm:  [-] Sinus tachycardia (04/25 1200) Resp:  [18-20] 20  (04/25 0727) BP: (94-110)/(22-71) 105/62 mmHg (04/25 1200) SpO2:  [91 %-98 %] 96 % (04/25 1200) Weight:  [76.6 kg (168 lb 14 oz)] 76.6 kg (168 lb 14 oz) (04/25 0500)  Hemodynamic parameters for last 24 hours:    Intake/Output from previous day: 04/24 0701 - 04/25 0700 In: 1226.4 [P.O.:840; I.V.:386.4] Out: 1200 [Urine:1200] Intake/Output this shift: Total I/O In: 613.8 [P.O.:480; I.V.:133.8] Out: 1250 [Urine:1250]  General appearance: alert and cooperative Heart: regular rate and rhythm, S1, S2 normal, no murmur, click, rub or gallop Lungs: clear to auscultation bilaterally Extremities: extremities normal, atraumatic, no cyanosis or edema  Lab Results:  Basename 10/16/11 1645  WBC 7.3  HGB 13.5  HCT 41.6  PLT 228   BMET:  Basename 10/19/11 0448 10/18/11 0515  NA 138 138  K 4.2 4.4  CL 104 103  CO2 26 26  GLUCOSE 102* 100*  BUN 13 16  CREATININE 0.94 0.97  CALCIUM 9.0 8.7    PT/INR: No results found for this basename: LABPROT,INR in the last 72 hours ABG    Component Value Date/Time   PHART 7.406 10/16/2011 0914   HCO3 27.5* 10/16/2011 0914   TCO2 29 10/16/2011 0914   ACIDBASEDEF 1.1 10/09/2011 2132   O2SAT 71.0 10/16/2011 0914   CBG (last 3)  No results found for this basename: GLUCAP:3 in the last 72 hours  Transesophageal Echocardiography  Patient: Charles Savage, Charles Savage MR #: [id number] Study Date: 10/17/2011 Gender: M Age: 56 Height: 167.6cm Weight: 78.6kg BSA: 1.43m^2 Pt. Status: Room: 2926  ATTENDING Dietrich Pates, MD PERFORMING Nicholes Mango, MD SONOGRAPHER Kwong(Billy) Dory Peru,  RDCS ORDERING Bensimhon, Hardie Shackleton Bensimhon, Daniel cc:  ------------------------------------------------------------ LV EF: 20% - 25%  ------------------------------------------------------------ Indications: Mitral regurgitation 424.0.  ------------------------------------------------------------ Study Conclusions  - Left ventricle: The cavity size was mildly dilated. Systolic function was severely reduced. The estimated ejection fraction was in the range of 20% to 25%. - Aortic valve: No evidence of vegetation. Trivial regurgitation. - Mitral valve: No evidence of vegetation. Mild to moderate regurgitation. - Left atrium: The atrium was moderately dilated. No evidence of thrombus in the atrial cavity or appendage. - Right ventricle: Systolic function was moderately reduced. - Right atrium: No evidence of thrombus in the atrial cavity or appendage. - Atrial septum: No defect or patent foramen ovale was identified. - Tricuspid valve: No evidence of vegetation. - Pulmonic valve: No evidence of vegetation. - Pericardium, extracardiac: A moderate pericardial effusion was identified along the right ventricular free wall and along the right atrial free wall. There was no evidence of hemodynamic compromise. Transesophageal echocardiography. 2D and color Doppler. Height: Height: 167.6cm. Height: 66in. Weight: Weight: 78.6kg. Weight: 173lb. Body mass index: BMI: 28kg/m^2. Body surface area: BSA: 1.29m^2. Blood pressure: 102/71. Patient status: Inpatient. Location: Endoscopy.  ------------------------------------------------------------  ------------------------------------------------------------ Left ventricle: The cavity size was mildly dilated. Systolic function was severely reduced. The estimated ejection fraction was in the range of 20% to 25%.  ------------------------------------------------------------ Aortic valve: Structurally normal valve. Cusp  separation was normal. No evidence of vegetation. Doppler: Trivial regurgitation.  ------------------------------------------------------------ Aorta: There was severe atheromatous plaque in the descending aorta.  ------------------------------------------------------------  Mitral valve: Structurally normal valve. Leaflet separation was normal. No evidence of vegetation. Doppler: Mild to moderate regurgitation.  ------------------------------------------------------------ Left atrium: The atrium was moderately dilated. No evidence of thrombus in the atrial cavity or appendage.  ------------------------------------------------------------ Atrial septum: No defect or patent foramen ovale was identified.  ------------------------------------------------------------ Right ventricle: Systolic function was moderately reduced.  ------------------------------------------------------------ Pulmonic valve: Structurally normal valve. Cusp separation was normal. No evidence of vegetation.  ------------------------------------------------------------ Tricuspid valve: Structurally normal valve. Leaflet separation was normal. No evidence of vegetation. Doppler: Trivial regurgitation.  ------------------------------------------------------------ Right atrium: The atrium was normal in size. No evidence of thrombus in the atrial cavity or appendage.  ------------------------------------------------------------ Pericardium: A moderate pericardial effusion was identified along the right ventricular free wall and along the right atrial free wall. There was no evidence of hemodynamic compromise.  ------------------------------------------------------------ Prepared and Electronically Authenticated by  Nicholes Mango, MD 2013-04-24T18:10:[id number]   Assessment/Plan: S/P Procedure(s) (LRB): TRANSESOPHAGEAL ECHOCARDIOGRAM (TEE) (N/A) His TEE was reviewed and shows structurally normal mitral  valve with only mild to moderate MR  due to annular dilatation. I don't hear a murmur any longer. It may be that all he needs is CABG to improve LV function and prevent CHF.  I will need to decide about this in the OR. I discussed the operative procedure with the patient and family including alternatives, benefits and risks; including but not limited to bleeding, blood transfusion, infection, stroke, myocardial infarction, graft failure, heart block requiring a permanent pacemaker, organ dysfunction, and death.  Charles Savage understands and agrees to proceed.  We will schedule surgery for the am.   LOS: 10 days    Yuki Purves K 10/19/2011

## 2011-10-19 NOTE — Progress Notes (Signed)
Advanced Heart Failure Rounding Note   Subjective:    Charles Savage is an 56 y.o. male followed by Dr. Donnie Aho. He has h/o of ETOH and tobacco use. He was apparently doing OK until he had a stroke in February 2013, treated by L [facility]A at [facility].   He was admitted to [facility] in 3/17-3/27 with respiratory failure associated with COPD and healthcare associated pneumonia Hospitalization complicated by as well as non-ST EMI (peak Troponin ~2), and acute renal failure with ATN. Echo at that time EF 40-45% with global HK and inferior akinesis and severe posterior MR. Managed medically due to renal failure.   Readmitted 4/4-4/8 with recurrent SOB and renal failure thought due to persistent PNA. Echo repeated EF 35-40% with moderate to severe MR.  Readmitted to [facility] 4/15 With systolic HF and hypotension. CE normal. Weight down about 12 pounds since admit. Cr initially 1.8 now 1.4. SBP 95-110. ECG (which I reviewed personally) ST 110 nonspecific TWA. CXR with stable patchy airspace disease suggestive of PNA. Effusions resolving. Day # 7 avelox/cefepime  10/16/11 RHC/LHC On milrinone 0.25 mg.kg/min  RA = 5  RV = 30/5/7  PA = 30/16 (23)  PCW = 20 (no significant v-waves)  Fick cardiac output/index = 5.46/2.8  PVR = 0.5 Woods  SVR = 981  FA sat = 93%  PA sat = 67%, 71% (on milrinone)  Ao Pressure: 92/63 (76)  LV Pressure: 98/8/18   1. 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 Ischemic CM with EF 25-30%  3. 3+ mitral regurgitation  10/17/11 TEE LEFT VENTRICLE: EF = 25% Mildly dilated. Global HK.  RIGHT VENTRICLE: Moderately HK  LEFT ATRIUM: Moderately dilated  LEFT ATRIAL APPENDAGE: No thrombus  RIGHT ATRIUM: Normal  AORTIC VALVE: Trileaflet. Trivial AI. No AS.  MITRAL VALVE: Structurally normal. Mild to moderate MR.  TRICUSPID VALVE: Normal Trivial TR  PULMONIC VALVE: Normal  INTERATRIAL SEPTUM: No ASD or PFO.  PERICARDIUM: Moderate effusion along RA//RV. No  tamponade.  DESCENDING AORTA: Severe plaque.  Denies SOB/PND/Orthopnea/CP       Objective:   Weight Range:  Vital Signs:   Temp:  [97.4 F (36.3 C)-98.9 F (37.2 C)] 98.2 F (36.8 C) (04/25 0727) Pulse Rate:  [102-104] 102  (04/25 0727) Resp:  [18-19] 18  (04/24 2312) BP: (94-110)/(22-71) 94/55 mmHg (04/25 0727) SpO2:  [91 %-98 %] 95 % (04/25 0727) Weight:  [76.6 kg (168 lb 14 oz)] 76.6 kg (168 lb 14 oz) (04/25 0500) Last BM Date: 10/16/11  Weight change: Filed Weights   10/17/11 0433 10/18/11 0437 10/19/11 0500  Weight: 78.8 kg (173 lb 11.6 oz) 77.6 kg (171 lb 1.2 oz) 76.6 kg (168 lb 14 oz)    Intake/Output:   Intake/Output Summary (Last 24 hours) at 10/19/11 0814 Last data filed at 10/19/11 0700  Gross per 24 hour  Intake  970.3 ml  Output   1200 ml  Net -229.7 ml     Physical Exam: General:  Well appearing. No resp difficulty HEENT: normal Neck: supple. JVP 5. Carotids 2+ bilat; no bruits. No lymphadenopathy or thryomegaly appreciated. Cor: PMI nondisplaced. Regular rate & rhythm. No rubs, gallops or murmurs. Lungs: clear Abdomen: soft, nontender, nondistended. No hepatosplenomegaly. No bruits or masses. Good bowel sounds. Large ecchymosis LLQ Extremities: no cyanosis, clubbing, rash, edema. R groin site no hematoma or bruit Neuro: alert & orientedx3, cranial nerves grossly intact. moves all 4 extremities w/o difficulty. Affect pleasant  Telemetry: SR  Labs: Basic Metabolic Panel:  Lab 10/19/11 1610 10/18/11 0515 10/17/11 1023 10/16/11 1645 10/16/11 0532 10/15/11 0620  NA 138 138 139 -- 139 141  K 4.2 4.4 4.4 -- 3.6 3.1*  CL 104 103 104 -- 99 101  CO2 26 26 27  -- 29 29  GLUCOSE 102* 100* 107* -- 102* 100*  BUN 13 16 13  -- 13 14  CREATININE 0.94 0.97 0.90 1.00 0.99 --  CALCIUM 9.0 8.7 8.9 -- -- --  MG -- -- -- -- -- --  PHOS -- -- -- -- -- --    Liver Function Tests: No results found for this basename:  AST:5,ALT:5,ALKPHOS:5,BILITOT:5,PROT:5,ALBUMIN:5 in the last 168 hours No results found for this basename: LIPASE:5,AMYLASE:5 in the last 168 hours No results found for this basename: AMMONIA:3 in the last 168 hours  CBC:  Lab 10/16/11 1645 10/16/11 0532 10/13/11 0532  WBC 7.3 10.8* 11.0*  NEUTROABS -- -- --  HGB 13.5 14.4 12.3*  HCT 41.6 42.5 38.0*  MCV 94.5 92.2 94.5  PLT 228 249 257    Cardiac Enzymes: No results found for this basename: CKTOTAL:5,CKMB:5,CKMBINDEX:5,TROPONINI:5 in the last 168 hours  BNP: BNP (last 3 results)  Basename 10/13/11 1041 10/12/11 0537 10/09/11 2011  PROBNP 7418.0* 6773.0* 14487.0*     Other results:  EKG:   Imaging: No results found.   Medications:     Scheduled Medications:    . antiseptic oral rinse  15 mL Mouth Rinse BID  . aspirin  81 mg Oral Daily  . digoxin  0.125 mg Oral Daily  . docusate sodium  100 mg Oral BID  . enoxaparin  40 mg Subcutaneous Q24H  . furosemide  40 mg Oral Daily  . lisinopril  2.5 mg Oral Daily  . moxifloxacin  400 mg Oral q1800  . nicotine  14 mg Transdermal Daily  . potassium chloride  40 mEq Oral BID  . sodium chloride  3 mL Intravenous Q12H  . DISCONTD: aspirin EC  81 mg Oral Daily    Infusions:    . sodium chloride 10 mL/hr at 10/17/11 2038  . milrinone 0.25 mcg/kg/min (10/18/11 1619)    PRN Medications: acetaminophen, acetaminophen, acetaminophen, ALPRAZolam, cyclobenzaprine, HYDROcodone-acetaminophen, levalbuterol, lidocaine, ondansetron (ZOFRAN) IV, ondansetron   Assessment:  1. A/c systolic CHF  2. Ischemic CM EF 25-30%  3. Moderate to severe mitral regurgitation  4. A/C respiratory failure  5. COPD  6. CVA 2/13 - s/p L CEA on Plavix now  7. Recent PNA  8. A/C renal failure  - resolved 9. H/o ETOH and tobacco abuse, now quit  10. 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 SVT, brief    .    Plan/Discussion:    Doing well. Volume status stable. Weight  down 22 pounds since admit. Continue Milrinone 0.25 mcg/kg/min. PT to see.   Plan for CABG +/- MVR 10/20/11 by Dr Laneta Simmers.     Length of Stay: 10  Amy Clegg NP-C CLEGG,AMY 10/19/2011, 8:14 AM CLEGG,AMY 8:14 AM

## 2011-10-20 ENCOUNTER — Inpatient Hospital Stay (HOSPITAL_COMMUNITY): Payer: Medicaid Other | Admitting: Anesthesiology

## 2011-10-20 ENCOUNTER — Encounter (HOSPITAL_COMMUNITY): Payer: Self-pay | Admitting: Anesthesiology

## 2011-10-20 ENCOUNTER — Encounter (HOSPITAL_COMMUNITY)
Admission: EM | Disposition: A | Discharge status: Home / Self Care | Payer: Self-pay | Source: Home / Self Care | Attending: Internal Medicine

## 2011-10-20 ENCOUNTER — Inpatient Hospital Stay (HOSPITAL_COMMUNITY): Payer: Medicaid Other

## 2011-10-20 DIAGNOSIS — I059 Rheumatic mitral valve disease, unspecified: Secondary | ICD-10-CM

## 2011-10-20 DIAGNOSIS — I2510 Atherosclerotic heart disease of native coronary artery without angina pectoris: Secondary | ICD-10-CM

## 2011-10-20 HISTORY — PX: MITRAL VALVE REPAIR: SHX2039

## 2011-10-20 HISTORY — PX: CORONARY ARTERY BYPASS GRAFT: SHX141

## 2011-10-20 LAB — CBC
HCT: 22.1 % — ABNORMAL LOW (ref 39.0–52.0)
Hemoglobin: 13.7 g/dL (ref 13.0–17.0)
Hemoglobin: 7.5 g/dL — ABNORMAL LOW (ref 13.0–17.0)
MCH: 31.3 pg (ref 26.0–34.0)
MCH: 31.2 pg (ref 26.0–34.0)
MCV: 92.1 fL (ref 78.0–100.0)
MCV: 93.1 fL (ref 78.0–100.0)
Platelets: 219 10*3/uL (ref 150–400)
Platelets: 114 10*3/uL — ABNORMAL LOW (ref 150–400)
Platelets: 115 10*3/uL — ABNORMAL LOW (ref 150–400)
RBC: 4.41 MIL/uL (ref 4.22–5.81)
RBC: 2.4 MIL/uL — ABNORMAL LOW (ref 4.22–5.81)
RDW: 16 % — ABNORMAL HIGH (ref 11.5–15.5)
WBC: 9.3 10*3/uL (ref 4.0–10.5)
WBC: 6.9 10*3/uL (ref 4.0–10.5)

## 2011-10-20 LAB — POCT I-STAT 3, ART BLOOD GAS (G3+)
Acid-base deficit: 2 mmol/L (ref 0.0–2.0)
Bicarbonate: 26.4 mEq/L — ABNORMAL HIGH (ref 20.0–24.0)
TCO2: 28 mmol/L (ref 0–100)
TCO2: 22 mmol/L (ref 0–100)
pCO2 arterial: 49.8 mmHg — ABNORMAL HIGH (ref 35.0–45.0)
pCO2 arterial: 35.4 mmHg (ref 35.0–45.0)
pCO2 arterial: 44.7 mmHg (ref 35.0–45.0)
pCO2 arterial: 43.9 mmHg (ref 35.0–45.0)
pCO2 arterial: 40.8 mmHg (ref 35.0–45.0)
pH, Arterial: 7.331 — ABNORMAL LOW (ref 7.350–7.450)
pH, Arterial: 7.376 (ref 7.350–7.450)
pH, Arterial: 7.321 — ABNORMAL LOW (ref 7.350–7.450)
pH, Arterial: 7.374 (ref 7.350–7.450)
pO2, Arterial: 421 mmHg — ABNORMAL HIGH (ref 80.0–100.0)
pO2, Arterial: 124 mmHg — ABNORMAL HIGH (ref 80.0–100.0)

## 2011-10-20 LAB — BASIC METABOLIC PANEL
CO2: 25 mEq/L (ref 19–32)
Calcium: 9.1 mg/dL (ref 8.4–10.5)
GFR calc non Af Amer: >90 mL/min (ref >90)
Potassium: 4.6 mEq/L (ref 3.5–5.1)
Sodium: 136 mEq/L (ref 135–145)

## 2011-10-20 LAB — POCT I-STAT 4, (NA,K, GLUC, HGB,HCT)
Glucose, Bld: 113 mg/dL — ABNORMAL HIGH (ref 70–99)
Glucose, Bld: 143 mg/dL — ABNORMAL HIGH (ref 70–99)
Glucose, Bld: 191 mg/dL — ABNORMAL HIGH (ref 70–99)
Glucose, Bld: 121 mg/dL — ABNORMAL HIGH (ref 70–99)
HCT: 40 % (ref 39.0–52.0)
HCT: 26 % — ABNORMAL LOW (ref 39.0–52.0)
HCT: 24 % — ABNORMAL LOW (ref 39.0–52.0)
HCT: 24 % — ABNORMAL LOW (ref 39.0–52.0)
Hemoglobin: 13.3 g/dL (ref 13.0–17.0)
Hemoglobin: 8.8 g/dL — ABNORMAL LOW (ref 13.0–17.0)
Hemoglobin: 8.8 g/dL — ABNORMAL LOW (ref 13.0–17.0)
Potassium: 4.7 mEq/L (ref 3.5–5.1)
Potassium: 5.1 mEq/L (ref 3.5–5.1)
Potassium: 4.5 mEq/L (ref 3.5–5.1)
Sodium: 139 mEq/L (ref 135–145)
Sodium: 138 mEq/L (ref 135–145)
Sodium: 138 mEq/L (ref 135–145)

## 2011-10-20 LAB — PREPARE RBC (CROSSMATCH)

## 2011-10-20 LAB — POCT I-STAT, CHEM 8
Calcium, Ion: 1.22 mmol/L (ref 1.12–1.32)
Chloride: 107 mEq/L (ref 96–112)
HCT: 19 % — ABNORMAL LOW (ref 39.0–52.0)
Potassium: 5.7 mEq/L — ABNORMAL HIGH (ref 3.5–5.1)

## 2011-10-20 LAB — HEMOGLOBIN A1C: Mean Plasma Glucose: 114 mg/dL (ref <117)

## 2011-10-20 LAB — PROTIME-INR
INR: 1.85 — ABNORMAL HIGH (ref 0.00–1.49)
Prothrombin Time: 21.7 seconds — ABNORMAL HIGH (ref 11.6–15.2)

## 2011-10-20 LAB — APTT
aPTT: 45 seconds — ABNORMAL HIGH (ref 24–37)
aPTT: 40 seconds — ABNORMAL HIGH (ref 24–37)

## 2011-10-20 SURGERY — CORONARY ARTERY BYPASS GRAFTING (CABG)
Anesthesia: General | Site: Chest | Wound class: Clean

## 2011-10-20 MED ORDER — ALBUMIN HUMAN 5 % IV SOLN: 250.0000 mL | INTRAVENOUS | Status: AC | PRN

## 2011-10-20 MED ORDER — HETASTARCH-ELECTROLYTES 6 % IV SOLN
INTRAVENOUS | Status: DC | PRN
  Administered 2011-10-20: 14:00:00 via INTRAVENOUS

## 2011-10-20 MED ORDER — FENTANYL CITRATE 0.05 MG/ML IJ SOLN
100.0000 ug | INTRAMUSCULAR | Status: DC | PRN
  Administered 2011-10-20 (×2): 25 ug via INTRAVENOUS
  Administered 2011-10-20 – 2011-10-21 (×6): 50 ug via INTRAVENOUS
  Administered 2011-10-21 (×2): 100 ug via INTRAVENOUS
  Filled 2011-10-20 (×5): qty 2

## 2011-10-20 MED ORDER — LACTATED RINGERS IV SOLN
INTRAVENOUS | Status: DC | PRN
  Administered 2011-10-20 (×3): via INTRAVENOUS

## 2011-10-20 MED ORDER — BISACODYL 5 MG PO TBEC
10.0000 mg | DELAYED_RELEASE_TABLET | Freq: Every day | ORAL | Status: DC
  Administered 2011-10-21 – 2011-10-25 (×5): 10 mg via ORAL
  Filled 2011-10-20 (×5): qty 2

## 2011-10-20 MED ORDER — ACETAMINOPHEN 500 MG PO TABS
1000.0000 mg | ORAL_TABLET | Freq: Four times a day (QID) | ORAL | Status: DC
  Administered 2011-10-21 – 2011-10-25 (×15): 1000 mg via ORAL
  Filled 2011-10-20 (×17): qty 2

## 2011-10-20 MED ORDER — HEMOSTATIC AGENTS (NO CHARGE) OPTIME
TOPICAL | Status: DC | PRN
  Administered 2011-10-20: 1 via TOPICAL

## 2011-10-20 MED ORDER — SODIUM CHLORIDE 0.9 % IV SOLN
0.4000 ug/kg/h | INTRAVENOUS | Status: DC
  Filled 2011-10-20: qty 4

## 2011-10-20 MED ORDER — MORPHINE SULFATE 2 MG/ML IJ SOLN: 1.0000 mg | INTRAMUSCULAR | Status: DC | PRN

## 2011-10-20 MED ORDER — PHENYLEPHRINE HCL 10 MG/ML IJ SOLN
20.0000 mg | INTRAVENOUS | Status: DC | PRN
  Administered 2011-10-20: 20 ug/min via INTRAVENOUS

## 2011-10-20 MED ORDER — ACETAMINOPHEN 160 MG/5ML PO SOLN
975.0000 mg | Freq: Four times a day (QID) | ORAL | Status: DC
  Filled 2011-10-20: qty 40.6

## 2011-10-20 MED ORDER — HEPARIN SODIUM (PORCINE) 1000 UNIT/ML IJ SOLN
INTRAMUSCULAR | Status: DC | PRN
  Administered 2011-10-20: 35000 [IU] via INTRAVENOUS

## 2011-10-20 MED ORDER — NITROGLYCERIN IN D5W 200-5 MCG/ML-% IV SOLN: 0.0000 ug/min | INTRAVENOUS | Status: DC

## 2011-10-20 MED ORDER — FENTANYL CITRATE 0.05 MG/ML IJ SOLN
INTRAMUSCULAR | Status: DC | PRN
  Administered 2011-10-20: 200 ug via INTRAVENOUS
  Administered 2011-10-20: 50 ug via INTRAVENOUS
  Administered 2011-10-20 (×4): 250 ug via INTRAVENOUS
  Administered 2011-10-20: 100 ug via INTRAVENOUS

## 2011-10-20 MED ORDER — LACTATED RINGERS IV SOLN
INTRAVENOUS | Status: DC
  Administered 2011-10-20: 115 mL via INTRAVENOUS
  Administered 2011-10-20: 140 mL via INTRAVENOUS

## 2011-10-20 MED ORDER — INSULIN REGULAR BOLUS VIA INFUSION
0.0000 [IU] | Freq: Three times a day (TID) | INTRAVENOUS | Status: DC
  Filled 2011-10-20: qty 10

## 2011-10-20 MED ORDER — ROCURONIUM BROMIDE 100 MG/10ML IV SOLN
INTRAVENOUS | Status: DC | PRN
  Administered 2011-10-20: 50 mg via INTRAVENOUS
  Administered 2011-10-20: 20 mg via INTRAVENOUS
  Administered 2011-10-20 (×2): 50 mg via INTRAVENOUS

## 2011-10-20 MED ORDER — DOCUSATE SODIUM 100 MG PO CAPS
200.0000 mg | ORAL_CAPSULE | Freq: Every day | ORAL | Status: DC
  Administered 2011-10-21 – 2011-10-25 (×5): 200 mg via ORAL
  Filled 2011-10-20 (×5): qty 2

## 2011-10-20 MED ORDER — SODIUM CHLORIDE 0.45 % IV SOLN: INTRAVENOUS | Status: DC

## 2011-10-20 MED ORDER — HEMOSTATIC AGENTS (NO CHARGE) OPTIME
TOPICAL | Status: DC | PRN
  Administered 2011-10-20: 3 via TOPICAL

## 2011-10-20 MED ORDER — ACETAMINOPHEN 160 MG/5ML PO SOLN: 650.0000 mg | ORAL | Status: AC

## 2011-10-20 MED ORDER — SODIUM CHLORIDE 0.9 % IV SOLN: 100.0000 [IU] | INTRAVENOUS | Status: DC | PRN

## 2011-10-20 MED ORDER — BISACODYL 10 MG RE SUPP: 10.0000 mg | Freq: Every day | RECTAL | Status: DC

## 2011-10-20 MED ORDER — ASPIRIN EC 325 MG PO TBEC
325.0000 mg | DELAYED_RELEASE_TABLET | Freq: Every day | ORAL | Status: DC
  Administered 2011-10-21 – 2011-10-25 (×5): 325 mg via ORAL
  Filled 2011-10-20 (×5): qty 1

## 2011-10-20 MED ORDER — PHENYLEPHRINE HCL 10 MG/ML IJ SOLN
30.0000 ug/min | INTRAVENOUS | Status: DC
  Filled 2011-10-20: qty 2

## 2011-10-20 MED ORDER — SODIUM CHLORIDE 0.9 % IV SOLN
INTRAVENOUS | Status: DC
  Filled 2011-10-20: qty 1

## 2011-10-20 MED ORDER — MORPHINE SULFATE 4 MG/ML IJ SOLN: 2.0000 mg | INTRAMUSCULAR | Status: DC | PRN

## 2011-10-20 MED ORDER — MILRINONE IN DEXTROSE 200-5 MCG/ML-% IV SOLN
0.1250 ug/kg/min | INTRAVENOUS | Status: DC
  Filled 2011-10-20: qty 100

## 2011-10-20 MED ORDER — MAGNESIUM SULFATE 40 MG/ML IJ SOLN
4.0000 g | Freq: Once | INTRAMUSCULAR | Status: AC
  Administered 2011-10-20: 4 g via INTRAVENOUS
  Filled 2011-10-20: qty 100

## 2011-10-20 MED ORDER — CALCIUM CHLORIDE 10 % IV SOLN
INTRAVENOUS | Status: DC | PRN
  Administered 2011-10-20: 1 g via INTRAVENOUS

## 2011-10-20 MED ORDER — DEXTROSE 5 % IV SOLN
2.0000 ug/min | INTRAVENOUS | Status: DC
  Filled 2011-10-20: qty 4

## 2011-10-20 MED ORDER — THROMBIN 20000 UNITS EX SOLR
CUTANEOUS | Status: DC | PRN
  Administered 2011-10-20: 20000 [IU] via TOPICAL

## 2011-10-20 MED ORDER — 0.9 % SODIUM CHLORIDE (POUR BTL) OPTIME
TOPICAL | Status: DC | PRN
  Administered 2011-10-20: 6000 mL

## 2011-10-20 MED ORDER — VECURONIUM BROMIDE 10 MG IV SOLR
INTRAVENOUS | Status: DC | PRN
  Administered 2011-10-20: 10 mg via INTRAVENOUS

## 2011-10-20 MED ORDER — ALBUMIN HUMAN 5 % IV SOLN
INTRAVENOUS | Status: DC | PRN
  Administered 2011-10-20 (×3): via INTRAVENOUS

## 2011-10-20 MED ORDER — SODIUM CHLORIDE 0.9 % IV SOLN: INTRAVENOUS | Status: DC

## 2011-10-20 MED ORDER — FENTANYL CITRATE 0.05 MG/ML IJ SOLN
INTRAMUSCULAR | Status: AC
  Administered 2011-10-20: 25 ug via INTRAVENOUS
  Filled 2011-10-20: qty 2

## 2011-10-20 MED ORDER — DEXTROSE 5 % IV SOLN
1.5000 g | Freq: Two times a day (BID) | INTRAVENOUS | Status: AC
  Administered 2011-10-20 – 2011-10-22 (×4): 1.5 g via INTRAVENOUS
  Filled 2011-10-20 (×4): qty 1.5

## 2011-10-20 MED ORDER — LIDOCAINE HCL (CARDIAC) 20 MG/ML IV SOLN
INTRAVENOUS | Status: DC | PRN
  Administered 2011-10-20: 50 mg via INTRAVENOUS

## 2011-10-20 MED ORDER — PANTOPRAZOLE SODIUM 40 MG PO TBEC
40.0000 mg | DELAYED_RELEASE_TABLET | Freq: Every day | ORAL | Status: DC
  Administered 2011-10-22 – 2011-10-25 (×4): 40 mg via ORAL
  Filled 2011-10-20 (×4): qty 1

## 2011-10-20 MED ORDER — PHENYLEPHRINE HCL 10 MG/ML IJ SOLN
0.0000 ug/min | INTRAVENOUS | Status: DC
  Filled 2011-10-20: qty 2

## 2011-10-20 MED ORDER — NOREPINEPHRINE BITARTRATE 1 MG/ML IJ SOLN
0.0000 ug/min | INTRAVENOUS | Status: DC
  Administered 2011-10-20: 7 ug/min via INTRAVENOUS
  Filled 2011-10-20: qty 4

## 2011-10-20 MED ORDER — MIDAZOLAM HCL 2 MG/2ML IJ SOLN: 2.0000 mg | INTRAMUSCULAR | Status: DC | PRN

## 2011-10-20 MED ORDER — SODIUM CHLORIDE 0.9 % IJ SOLN
3.0000 mL | INTRAMUSCULAR | Status: DC | PRN
  Administered 2011-10-21: 3 mL via INTRAVENOUS

## 2011-10-20 MED ORDER — LACTATED RINGERS IV SOLN: 500.0000 mL | Freq: Once | INTRAVENOUS | Status: AC | PRN

## 2011-10-20 MED ORDER — POTASSIUM CHLORIDE 10 MEQ/50ML IV SOLN: 10.0000 meq | INTRAVENOUS | Status: AC

## 2011-10-20 MED ORDER — MIDAZOLAM HCL 5 MG/5ML IJ SOLN
INTRAMUSCULAR | Status: DC | PRN
  Administered 2011-10-20: 5 mg via INTRAVENOUS
  Administered 2011-10-20 (×3): 2 mg via INTRAVENOUS
  Administered 2011-10-20: 1 mg via INTRAVENOUS
  Administered 2011-10-20 (×2): 2 mg via INTRAVENOUS

## 2011-10-20 MED ORDER — ONDANSETRON HCL 4 MG/2ML IJ SOLN
4.0000 mg | Freq: Four times a day (QID) | INTRAMUSCULAR | Status: DC | PRN
Start: 2011-10-20 — End: 2011-10-25
  Administered 2011-10-21 – 2011-10-23 (×2): 4 mg via INTRAVENOUS
  Filled 2011-10-20 (×2): qty 2

## 2011-10-20 MED ORDER — DOPAMINE-DEXTROSE 1.6-5 MG/ML-% IV SOLN
INTRAVENOUS | Status: DC | PRN
  Administered 2011-10-20: 3 ug/kg/min via INTRAVENOUS

## 2011-10-20 MED ORDER — PROPOFOL 10 MG/ML IV EMUL
INTRAVENOUS | Status: DC | PRN
  Administered 2011-10-20: 70 mg via INTRAVENOUS

## 2011-10-20 MED ORDER — SODIUM CHLORIDE 0.9 % IV SOLN
0.1000 ug/kg/h | INTRAVENOUS | Status: DC
  Administered 2011-10-21: 0.1 ug/kg/h via INTRAVENOUS
  Filled 2011-10-20: qty 2

## 2011-10-20 MED ORDER — MILRINONE IN DEXTROSE 200-5 MCG/ML-% IV SOLN
0.2500 ug/kg/min | INTRAVENOUS | Status: DC
  Filled 2011-10-20: qty 100

## 2011-10-20 MED ORDER — ASPIRIN 81 MG PO CHEW: 324.0000 mg | CHEWABLE_TABLET | Freq: Every day | ORAL | Status: DC

## 2011-10-20 MED ORDER — OXYCODONE HCL 5 MG PO TABS
5.0000 mg | ORAL_TABLET | ORAL | Status: DC | PRN
  Administered 2011-10-21 (×2): 10 mg via ORAL
  Administered 2011-10-21 (×2): 5 mg via ORAL
  Administered 2011-10-21 – 2011-10-22 (×6): 10 mg via ORAL
  Administered 2011-10-23 (×2): 5 mg via ORAL
  Filled 2011-10-20: qty 1
  Filled 2011-10-20 (×4): qty 2
  Filled 2011-10-20: qty 1
  Filled 2011-10-20 (×4): qty 2
  Filled 2011-10-20 (×3): qty 1

## 2011-10-20 MED ORDER — ACETAMINOPHEN 650 MG RE SUPP
650.0000 mg | RECTAL | Status: AC
  Administered 2011-10-20: 650 mg via RECTAL

## 2011-10-20 MED ORDER — SODIUM CHLORIDE 0.9 % IV SOLN
200.0000 ug | INTRAVENOUS | Status: DC | PRN
  Administered 2011-10-20: 0.2 ug/kg/h via INTRAVENOUS

## 2011-10-20 MED ORDER — FAMOTIDINE IN NACL 20-0.9 MG/50ML-% IV SOLN
20.0000 mg | Freq: Two times a day (BID) | INTRAVENOUS | Status: AC
  Administered 2011-10-20: 20 mg via INTRAVENOUS

## 2011-10-20 MED ORDER — VANCOMYCIN HCL IN DEXTROSE 1-5 GM/200ML-% IV SOLN
1000.0000 mg | Freq: Once | INTRAVENOUS | Status: AC
  Administered 2011-10-20: 1000 mg via INTRAVENOUS
  Filled 2011-10-20: qty 200

## 2011-10-20 MED ORDER — NOREPINEPHRINE BITARTRATE 1 MG/ML IJ SOLN
4000.0000 ug | INTRAVENOUS | Status: DC | PRN
  Administered 2011-10-20: 3 ug/min via INTRAVENOUS

## 2011-10-20 MED ORDER — SODIUM CHLORIDE 0.9 % IV SOLN
100.0000 [IU] | INTRAVENOUS | Status: DC | PRN
  Administered 2011-10-20: 1.4 [IU]/h via INTRAVENOUS

## 2011-10-20 MED ORDER — SODIUM CHLORIDE 0.9 % IV SOLN
250.0000 mL | INTRAVENOUS | Status: DC
  Administered 2011-10-21: 250 mL via INTRAVENOUS

## 2011-10-20 MED ORDER — SODIUM CHLORIDE 0.9 % IJ SOLN
3.0000 mL | Freq: Two times a day (BID) | INTRAMUSCULAR | Status: DC
  Administered 2011-10-21 – 2011-10-22 (×2): 3 mL via INTRAVENOUS

## 2011-10-20 MED ORDER — DOPAMINE-DEXTROSE 3.2-5 MG/ML-% IV SOLN: 0.0000 ug/kg/min | INTRAVENOUS | Status: DC

## 2011-10-20 MED ORDER — PROTAMINE SULFATE 10 MG/ML IV SOLN
INTRAVENOUS | Status: DC | PRN
  Administered 2011-10-20 (×2): 20 mg via INTRAVENOUS
  Administered 2011-10-20: 10 mg via INTRAVENOUS
  Administered 2011-10-20: 20 mg via INTRAVENOUS
  Administered 2011-10-20: 50 mg via INTRAVENOUS
  Administered 2011-10-20: 20 mg via INTRAVENOUS
  Administered 2011-10-20: 30 mg via INTRAVENOUS
  Administered 2011-10-20 (×2): 40 mg via INTRAVENOUS
  Administered 2011-10-20: 50 mg via INTRAVENOUS

## 2011-10-20 SURGICAL SUPPLY — 122 items
ADAPTER CARDIO PERF ANTE/RETRO (ADAPTER) ×2 IMPLANT
ATTRACTOMAT 16X20 MAGNETIC DRP (DRAPES) ×2 IMPLANT
BAG DECANTER FOR FLEXI CONT (MISCELLANEOUS) ×2 IMPLANT
BANDAGE ELASTIC 4 VELCRO ST LF (GAUZE/BANDAGES/DRESSINGS) ×2 IMPLANT
BANDAGE ELASTIC 6 VELCRO ST LF (GAUZE/BANDAGES/DRESSINGS) ×2 IMPLANT
BANDAGE GAUZE ELAST BULKY 4 IN (GAUZE/BANDAGES/DRESSINGS) ×2 IMPLANT
BASKET HEART (ORDER IN 25'S) (MISCELLANEOUS) ×1
BASKET HEART (ORDER IN 25S) (MISCELLANEOUS) ×1 IMPLANT
BENZOIN TINCTURE PRP APPL 2/3 (GAUZE/BANDAGES/DRESSINGS) ×2 IMPLANT
BLADE STERNUM SYSTEM 6 (BLADE) ×4 IMPLANT
BLADE SURG 15 STRL LF DISP TIS (BLADE) ×2 IMPLANT
BLADE SURG 15 STRL SS (BLADE) ×2
CANISTER SUCTION 2500CC (MISCELLANEOUS) ×2 IMPLANT
CANN PRFSN 3/8XCNCT ST RT ANG (MISCELLANEOUS) ×1
CANNULA GUNDRY RCSP 15FR (MISCELLANEOUS) ×2 IMPLANT
CANNULA PRFSN 3/8XCNCT RT ANG (MISCELLANEOUS) ×1 IMPLANT
CANNULA VEN MTL TIP RT (MISCELLANEOUS) ×1
CANNULA VRC MALB SNGL STG 36FR (MISCELLANEOUS) ×1 IMPLANT
CATH ROBINSON RED A/P 18FR (CATHETERS) ×8 IMPLANT
CATH THORACIC 28FR (CATHETERS) ×2 IMPLANT
CATH THORACIC 28FR RT ANG (CATHETERS) IMPLANT
CATH THORACIC 36FR (CATHETERS) ×2 IMPLANT
CATH THORACIC 36FR RT ANG (CATHETERS) ×2 IMPLANT
CLIP TI MEDIUM 24 (CLIP) IMPLANT
CLIP TI WIDE RED SMALL 24 (CLIP) ×4 IMPLANT
CLOTH BEACON ORANGE TIMEOUT ST (SAFETY) ×2 IMPLANT
CONN 1/2X1/2X1/2  BEN (MISCELLANEOUS) ×2
CONN 1/2X1/2X1/2 BEN (MISCELLANEOUS) ×2 IMPLANT
CONN 3/8X1/2 ST GISH (MISCELLANEOUS) ×4 IMPLANT
CONN 3/8X3/8 GISH STERILE (MISCELLANEOUS) ×4 IMPLANT
COVER SURGICAL LIGHT HANDLE (MISCELLANEOUS) ×4 IMPLANT
CRADLE DONUT ADULT HEAD (MISCELLANEOUS) ×2 IMPLANT
DRAPE CARDIOVASCULAR INCISE (DRAPES) ×1
DRAPE SLUSH MACHINE 52X66 (DRAPES) IMPLANT
DRAPE SLUSH/WARMER DISC (DRAPES) IMPLANT
DRAPE SRG 135X102X78XABS (DRAPES) ×1 IMPLANT
DRSG COVADERM 4X14 (GAUZE/BANDAGES/DRESSINGS) ×2 IMPLANT
ELECT CAUTERY BLADE 6.4 (BLADE) ×2 IMPLANT
ELECT REM PT RETURN 9FT ADLT (ELECTROSURGICAL) ×4
ELECTRODE REM PT RTRN 9FT ADLT (ELECTROSURGICAL) ×2 IMPLANT
GLOVE BIO SURGEON STRL SZ 6 (GLOVE) ×4 IMPLANT
GLOVE BIO SURGEON STRL SZ 6.5 (GLOVE) ×8 IMPLANT
GLOVE BIO SURGEON STRL SZ7 (GLOVE) IMPLANT
GLOVE BIO SURGEON STRL SZ7.5 (GLOVE) IMPLANT
GLOVE BIOGEL PI IND STRL 6 (GLOVE) IMPLANT
GLOVE BIOGEL PI IND STRL 6.5 (GLOVE) ×2 IMPLANT
GLOVE BIOGEL PI IND STRL 7.0 (GLOVE) ×6 IMPLANT
GLOVE BIOGEL PI INDICATOR 6 (GLOVE)
GLOVE BIOGEL PI INDICATOR 6.5 (GLOVE) ×2
GLOVE BIOGEL PI INDICATOR 7.0 (GLOVE) ×6
GLOVE EUDERMIC 7 POWDERFREE (GLOVE) ×4 IMPLANT
GLOVE ORTHO TXT STRL SZ7.5 (GLOVE) IMPLANT
GOWN PREVENTION PLUS XLARGE (GOWN DISPOSABLE) ×2 IMPLANT
GOWN STRL NON-REIN LRG LVL3 (GOWN DISPOSABLE) ×16 IMPLANT
HEMOSTAT POWDER SURGIFOAM 1G (HEMOSTASIS) ×6 IMPLANT
HEMOSTAT SURGICEL 2X14 (HEMOSTASIS) ×2 IMPLANT
INSERT FOGARTY 61MM (MISCELLANEOUS) IMPLANT
INSERT FOGARTY XLG (MISCELLANEOUS) IMPLANT
KIT BASIN OR (CUSTOM PROCEDURE TRAY) ×2 IMPLANT
KIT CATH CPB BARTLE (MISCELLANEOUS) ×2 IMPLANT
KIT ROOM TURNOVER OR (KITS) ×2 IMPLANT
KIT SUCTION CATH 14FR (SUCTIONS) ×2 IMPLANT
KIT VASOVIEW W/TROCAR VH 2000 (KITS) ×2 IMPLANT
LINE VENT (MISCELLANEOUS) ×2 IMPLANT
LOOP VESSEL SUPERMAXI WHITE (MISCELLANEOUS) ×4 IMPLANT
NS IRRIG 1000ML POUR BTL (IV SOLUTION) ×10 IMPLANT
PACK OPEN HEART (CUSTOM PROCEDURE TRAY) ×2 IMPLANT
PAD ARMBOARD 7.5X6 YLW CONV (MISCELLANEOUS) ×4 IMPLANT
PENCIL BUTTON HOLSTER BLD 10FT (ELECTRODE) ×2 IMPLANT
PUNCH AORTIC ROTATE 4.0MM (MISCELLANEOUS) IMPLANT
PUNCH AORTIC ROTATE 4.5MM 8IN (MISCELLANEOUS) ×2 IMPLANT
PUNCH AORTIC ROTATE 5MM 8IN (MISCELLANEOUS) IMPLANT
RING MITRAL MEMO 3D 28MM SMD28 (Prosthesis & Implant Heart) ×2 IMPLANT
SEALANT SURG COSEAL 8ML (VASCULAR PRODUCTS) ×2 IMPLANT
SET CARDIOPLEGIA MPS 5001102 (MISCELLANEOUS) ×2 IMPLANT
SPONGE GAUZE 4X4 12PLY (GAUZE/BANDAGES/DRESSINGS) ×4 IMPLANT
SPONGE INTESTINAL PEANUT (DISPOSABLE) IMPLANT
SPONGE LAP 18X18 X RAY DECT (DISPOSABLE) ×4 IMPLANT
SPONGE LAP 4X18 X RAY DECT (DISPOSABLE) ×4 IMPLANT
STRIP CLOSURE SKIN 1/2X4 (GAUZE/BANDAGES/DRESSINGS) ×2 IMPLANT
SUCKER INTRACARDIAC WEIGHTED (SUCKER) ×2 IMPLANT
SUCKER WEIGHTED FLEX (MISCELLANEOUS) ×2 IMPLANT
SUT BONE WAX W31G (SUTURE) ×2 IMPLANT
SUT ETHIBOND 2 0 SH (SUTURE) ×3 IMPLANT
SUT ETHIBOND 2 0 SH 36X2 (SUTURE) ×3 IMPLANT
SUT ETHIBOND 2 0 V4 (SUTURE) ×4 IMPLANT
SUT ETHIBOND 2 0V4 GREEN (SUTURE) ×4 IMPLANT
SUT MNCRL AB 4-0 PS2 18 (SUTURE) ×2 IMPLANT
SUT PROLENE 3 0 SH DA (SUTURE) ×6 IMPLANT
SUT PROLENE 3 0 SH1 36 (SUTURE) ×2 IMPLANT
SUT PROLENE 4 0 RB 1 (SUTURE) ×5
SUT PROLENE 4 0 SH DA (SUTURE) IMPLANT
SUT PROLENE 4-0 RB1 .5 CRCL 36 (SUTURE) ×5 IMPLANT
SUT PROLENE 5 0 C 1 36 (SUTURE) ×6 IMPLANT
SUT PROLENE 6 0 C 1 30 (SUTURE) ×2 IMPLANT
SUT PROLENE 7 0 BV 1 (SUTURE) ×2 IMPLANT
SUT PROLENE 7 0 BV1 MDA (SUTURE) ×4 IMPLANT
SUT PROLENE 8 0 BV175 6 (SUTURE) IMPLANT
SUT SILK  1 MH (SUTURE)
SUT SILK 1 MH (SUTURE) IMPLANT
SUT SILK 2 0 SH CR/8 (SUTURE) ×2 IMPLANT
SUT STEEL STERNAL CCS#1 18IN (SUTURE) IMPLANT
SUT STEEL SZ 6 DBL 3X14 BALL (SUTURE) ×6 IMPLANT
SUT VIC AB 1 CTX 36 (SUTURE) ×2
SUT VIC AB 1 CTX36XBRD ANBCTR (SUTURE) ×2 IMPLANT
SUT VIC AB 2-0 CT1 27 (SUTURE) ×1
SUT VIC AB 2-0 CT1 TAPERPNT 27 (SUTURE) ×1 IMPLANT
SUT VIC AB 2-0 CTX 27 (SUTURE) IMPLANT
SUT VIC AB 3-0 SH 27 (SUTURE)
SUT VIC AB 3-0 SH 27X BRD (SUTURE) IMPLANT
SUT VIC AB 3-0 X1 27 (SUTURE) IMPLANT
SUT VICRYL 4-0 PS2 18IN ABS (SUTURE) IMPLANT
SUTURE E-PAK OPEN HEART (SUTURE) ×2 IMPLANT
SYSTEM SAHARA CHEST DRAIN ATS (WOUND CARE) ×2 IMPLANT
TOWEL OR 17X24 6PK STRL BLUE (TOWEL DISPOSABLE) ×4 IMPLANT
TOWEL OR 17X26 10 PK STRL BLUE (TOWEL DISPOSABLE) ×4 IMPLANT
TRAY FOLEY IC TEMP SENS 14FR (CATHETERS) ×2 IMPLANT
TUBE SUCT INTRACARD DLP 20F (MISCELLANEOUS) ×2 IMPLANT
TUBING INSUFFLATION 10FT LAP (TUBING) ×2 IMPLANT
UNDERPAD 30X30 INCONTINENT (UNDERPADS AND DIAPERS) ×2 IMPLANT
VRC MALLEABLE SINGLE STG 36FR (MISCELLANEOUS) ×2
WATER STERILE IRR 1000ML POUR (IV SOLUTION) ×4 IMPLANT

## 2011-10-20 NOTE — Procedures (Signed)
Extubation Procedure Note  Patient Details:   Name: Charles Savage DOB: 1956-04-27 MRN: [id number]   Airway Documentation:   Patient extubated to 4 lpm nasal cannula.  VC 700 ml, NIF -30, able to hold head off bed 10 seconds.  Able to breathe around deflated cuff and able to vocalize post procedure.  Tolerated well, no complications noted.   Evaluation  O2 sats: stable throughout Complications: No apparent complications Patient did tolerate procedure well. Bilateral Breath Sounds: Rhonchi Suctioning: Airway Yes  Rosanna Bickle, Aloha Gell 10/20/2011, 7:09 PM

## 2011-10-20 NOTE — Op Note (Signed)
Intraoperative Transesophageal Echocardiography Note:  [name] is a 56 year old male who was admitted to [facility] in March with with a non-STEMI and congestive heart failure. He was found to have severe three-vessel coronary disease with left ventricular dysfunction and moderate to severe mitral regurgitation. He is now scheduled to undergo coronary artery bypass grafting and possible mitral valve repair by Dr. Laneta Simmers Intraoperative transesophageal echo was indicated to evaluate the mitral valve to assist with procedure and to serve as a monitor for intraoperative volume status.  The patient was brought to Operating Room 12 at [facility] and general anesthesia was induced without difficulty. Following endotracheal intubation and orogastric suctioning, the transesophageal echocardiography probe was inserted into the esophagus without difficulty.  Impression: Pre-bypass Findings:  1. Mitral valve: There was dilation of the mitral annulus with a reduced zone of  coaptation of the mitral leaflets. This resulted in a central jet of mitral regurgitation which was judged as moderate to severe. There were no flail or prolapsing segments noted.The mitral leaflets opened normally but the leaflets were tethered with restricted closure. The mitral valve regurgitant orifice area was 0.36 cm with the mitral regurgitation volume of 29 cc using the Pisa method. There was systolic flow reversal in the left upper pulmonary vein.  There was mild to moderate mitral annular calcification noted.  2. Aortic valve: The aortic valve leaflets opened without restriction. The leaflets were not thickened. There was trace to 1+ aortic insufficiency.  3. Left ventricle: The left ventricular cavity was dilated and there was akinesis of the distal anterior wall, apex and distal lateral wall. Left ventricular end-diastolic diameter measured 7.05 cm. Left ventricular wall thickness measured 0.95-1.1 cm in end  diastole at the mid-papillary level. There is no thrombus noted in the left ventricular apex. Left ventricular ejection fraction was estimated at 25%.  4. Right ventricle: The right ventricular cavity appeared within normal limits of size and there appeared to be normal contractility the right ventricle free wall.  5. Tricuspid valve: The tricuspid valve appeared structurally normal there was trace to 1+ tricuspid insufficiency.  6 Inter-atrial septum: The inter-atrial septum is intact without evidence of patent foramen ovale or atrial septal defect by color Doppler or bubble study.  7 Left atrium: The left atrial cavity was enlarged and measured 6.3 cm in the superior inferior dimension and 5.8 cm in the anterior posterior dimension. There was no thrombus noted in the left atrium or left atrial appendage.  8.  Ascending aorta: Ascending aorta showed thickening of the wall but no dilation of the ascending aortic root.  9 Descending aorta: There was mild atheromatous disease noted in the descending aorta and the descending aorta measured 2.6 cm in diameter.  Post-bypass findings:  1. Mitral valve: There was an annuloplasty ring in the mitral position. The posterior leaflet was fixed and the anterior leaflet was mobile. There was no residual mitral insufficiency noted. Continuous-wave Doppler interrogation of the mitral inflow revealed a transmitral gradient of 4 mmHg and the mitral valve area was 3.2 cm using the pressure half-time method  2. Aortic valve: The aortic valve was unchanged from the pre-bypass study and there was trace to 1+ aortic insufficiency.  3. Left ventricle: The left ventricular function appeared unchanged from the pre-bypass study. There was global hypokinesis with akinesis of the apex distal anterolateral and posterolateral walls. The ejection fraction was again estimated at 25%.  4. Right ventricle: The right ventricular cavity appeared within normal limits of size  and  there appeared to be adequate contractility of the right ventricular free wall in the post-bypass period.   5. Tricuspid valve: The tricuspid valve was unchanged from the pre-bypass study and there was trace to 1+ tricuspid insufficiency.   Kipp Brood, M.D.

## 2011-10-20 NOTE — Progress Notes (Signed)
Pt transferred to OR via stretcher. Family present with patient.

## 2011-10-20 NOTE — Anesthesia Preprocedure Evaluation (Addendum)
Anesthesia Evaluation  Patient identified by MRN, date of birth, ID band Patient awake    Reviewed: Allergy & Precautions, H&P , NPO status , [name] Chart, lab work & pertinent test results, reviewed documented beta blocker date and time   History of Anesthesia Complications Negative for: history of anesthetic complications  Airway Mallampati: III TM Distance: <3 FB   Mouth opening: Limited Mouth Opening  Dental  (+) Missing   Pulmonary shortness of breath, pneumonia , COPD COPD inhaler, former smoker (quit Feb 2013) breath sounds clear to auscultation        Cardiovascular + CAD, + Past MI and +CHF Rhythm:Regular Rate:Tachycardia     Neuro/Psych CVA (s/p Left CEA), No Residual Symptoms    GI/Hepatic   Endo/Other    Renal/GU      Musculoskeletal   Abdominal   Peds  Hematology   Anesthesia Other Findings   Reproductive/Obstetrics                          Anesthesia Physical Anesthesia Plan  ASA: IV  Anesthesia Plan: General   Post-op Pain Management:    Induction: Intravenous  Airway Management Planned: Oral ETT  Additional Equipment: Arterial line, CVP, PA Cath, Ultrasound Guidance Line Placement and 3D TEE  Intra-op Plan:   Post-operative Plan: Post-operative intubation/ventilation  Informed Consent: I have reviewed the patients History and Physical, chart, labs and discussed the procedure including the risks, benefits and alternatives for the proposed anesthesia with the patient or authorized representative who has indicated his/her understanding and acceptance.   Dental advisory given  Plan Discussed with: Anesthesiologist, Surgeon and CRNA  Anesthesia Plan Comments:        Anesthesia Quick Evaluation

## 2011-10-20 NOTE — Anesthesia Postprocedure Evaluation (Signed)
  Anesthesia Post-op Note  Patient: Charles Savage  Procedure(s) Performed: Procedure(s) (LRB): CORONARY ARTERY BYPASS GRAFTING (CABG) (N/A) MITRAL VALVE REPAIR (MVR) (N/A)  Patient Location: SICU  Anesthesia Type: General  Level of Consciousness: sedated and Patient remains intubated per anesthesia plan  Airway and Oxygen Therapy: Patient remains intubated per anesthesia plan  Post-op Pain: none  Post-op Assessment: Post-op Vital signs reviewed and [name] Cardiovascular Status Stable  Post-op Vital Signs: stable  Complications: No apparent anesthesia complications

## 2011-10-20 NOTE — Transfer of Care (Signed)
Immediate Anesthesia Transfer of Care Note  Patient: Charles Savage  Procedure(s) Performed: Procedure(s) (LRB): CORONARY ARTERY BYPASS GRAFTING (CABG) (N/A) MITRAL VALVE REPAIR (MVR) (N/A)  Patient Location: SICU  Anesthesia Type: General  Level of Consciousness: Patient remains intubated per anesthesia plan  Airway & Oxygen Therapy: Patient remains intubated per anesthesia plan and Patient placed on Ventilator (see vital sign flow sheet for setting)  Post-op Assessment: Report given to PACU RN and Post -op Vital signs reviewed and stable  Post vital signs: Reviewed and stable  Complications: No apparent anesthesia complications

## 2011-10-20 NOTE — Brief Op Note (Addendum)
10/09/2011 - 10/20/2011  12:48 PM  PATIENT:  Drue Stager Menger  56 y.o. male  PRE-OPERATIVE DIAGNOSIS:  CAD, MR  POST-OPERATIVE DIAGNOSIS:  CAD, MR  PROCEDURE:  Procedure(s):  CORONARY ARTERY BYPASS GRAFTING (CABG) x 6 (LIMA-LAD, SVG-OM1-OM2-OM3, SVG-LPD, SVG-RCA)  MITRAL VALVE REPAIR (MVR) (28mm Sorin Memo 3D annuloplasty ring)  EVH Right leg  SURGEON:  Surgeon(s): Alleen Borne, MD  ASSISTANT: Coral Ceo, PA-C  ANESTHESIA:   general  PATIENT CONDITION:  ICU - intubated and hemodynamically stable.  PRE-OPERATIVE WEIGHT: 78 kg     Pre-bypass TEE:  Severe MR.  Severe LV dysfunction.  PA pressure almost systemic at that time.    Post-bypass TEE:  No MR. LV function improved.

## 2011-10-20 NOTE — Progress Notes (Signed)
Patient ID: Charles Savage, male   DOB: 1956/06/10, 56 y.o.   MRN: [id number] S/p CABG, mitral repair BP 99/52  Pulse 116  Temp(Src) 98.1 F (36.7 C) (Core (Comment))  Resp 8  Ht 5\' 6"  (1.676 m)  Wt 170 lb 13.7 oz (77.5 kg)  BMI 27.58 kg/m2  SpO2 92% On low dose Milrinone and dopamine Starting to wake up Vent wean in progress

## 2011-10-20 NOTE — Progress Notes (Signed)
  Echocardiogram Echocardiogram Transesophageal has been performed.  Charles Savage 10/20/2011, 8:32 AM

## 2011-10-20 NOTE — OR Nursing (Signed)
14:00 - call to vol. Desk to inform family off pump, also 1st call to SICU.  2nd call to SICU @ 14:55pm.

## 2011-10-21 ENCOUNTER — Inpatient Hospital Stay (HOSPITAL_COMMUNITY): Payer: Medicaid Other

## 2011-10-21 LAB — CBC
Hemoglobin: 8 g/dL — ABNORMAL LOW (ref 13.0–17.0)
MCH: 30.3 pg (ref 26.0–34.0)
MCHC: 34 g/dL (ref 30.0–36.0)
MCV: 89.7 fL (ref 78.0–100.0)
Platelets: 122 10*3/uL — ABNORMAL LOW (ref 150–400)
RDW: 16 % — ABNORMAL HIGH (ref 11.5–15.5)
RDW: 16.1 % — ABNORMAL HIGH (ref 11.5–15.5)
WBC: 11.3 10*3/uL — ABNORMAL HIGH (ref 4.0–10.5)

## 2011-10-21 LAB — CREATININE, SERUM: GFR calc Af Amer: >90 mL/min (ref >90)

## 2011-10-21 LAB — BASIC METABOLIC PANEL
BUN: 9 mg/dL (ref 6–23)
Calcium: 7.9 mg/dL — ABNORMAL LOW (ref 8.4–10.5)
GFR calc Af Amer: >90 mL/min (ref >90)
GFR calc non Af Amer: >90 mL/min (ref >90)
Glucose, Bld: 98 mg/dL (ref 70–99)
Sodium: 133 mEq/L — ABNORMAL LOW (ref 135–145)

## 2011-10-21 LAB — GLUCOSE, CAPILLARY
Glucose-Capillary: 107 mg/dL — ABNORMAL HIGH (ref 70–99)
Glucose-Capillary: 110 mg/dL — ABNORMAL HIGH (ref 70–99)
Glucose-Capillary: 130 mg/dL — ABNORMAL HIGH (ref 70–99)
Glucose-Capillary: 137 mg/dL — ABNORMAL HIGH (ref 70–99)
Glucose-Capillary: 97 mg/dL (ref 70–99)
Glucose-Capillary: 108 mg/dL — ABNORMAL HIGH (ref 70–99)
Glucose-Capillary: 117 mg/dL — ABNORMAL HIGH (ref 70–99)
Glucose-Capillary: 126 mg/dL — ABNORMAL HIGH (ref 70–99)

## 2011-10-21 LAB — PREPARE FRESH FROZEN PLASMA
Unit division: 0
Unit division: 0

## 2011-10-21 LAB — PREPARE PLATELET PHERESIS: Unit division: 0

## 2011-10-21 LAB — POCT I-STAT, CHEM 8
BUN: 12 mg/dL (ref 6–23)
Chloride: 97 mEq/L (ref 96–112)
Sodium: 133 mEq/L — ABNORMAL LOW (ref 135–145)

## 2011-10-21 MED ORDER — CARVEDILOL 3.125 MG PO TABS
3.1250 mg | ORAL_TABLET | Freq: Two times a day (BID) | ORAL | Status: DC
  Administered 2011-10-21 – 2011-10-25 (×9): 3.125 mg via ORAL
  Filled 2011-10-21 (×11): qty 1

## 2011-10-21 MED ORDER — ENOXAPARIN SODIUM 40 MG/0.4ML ~~LOC~~ SOLN
40.0000 mg | SUBCUTANEOUS | Status: DC
  Administered 2011-10-21 – 2011-10-25 (×5): 40 mg via SUBCUTANEOUS
  Filled 2011-10-21 (×5): qty 0.4

## 2011-10-21 MED ORDER — ALPRAZOLAM 0.25 MG PO TABS
0.2500 mg | ORAL_TABLET | Freq: Three times a day (TID) | ORAL | Status: DC | PRN
  Administered 2011-10-21: 0.25 mg via ORAL
  Filled 2011-10-21 (×2): qty 1

## 2011-10-21 MED ORDER — FUROSEMIDE 10 MG/ML IJ SOLN
40.0000 mg | Freq: Once | INTRAMUSCULAR | Status: AC
  Administered 2011-10-21: 40 mg via INTRAVENOUS
  Filled 2011-10-21: qty 4

## 2011-10-21 MED ORDER — INSULIN ASPART 100 UNIT/ML ~~LOC~~ SOLN: 0.0000 [IU] | SUBCUTANEOUS | Status: DC

## 2011-10-21 MED ORDER — CYCLOBENZAPRINE HCL 10 MG PO TABS: 10.0000 mg | ORAL_TABLET | Freq: Three times a day (TID) | ORAL | Status: DC | PRN

## 2011-10-21 MED ORDER — INSULIN ASPART 100 UNIT/ML ~~LOC~~ SOLN
0.0000 [IU] | SUBCUTANEOUS | Status: DC
  Administered 2011-10-21: 2 [IU] via SUBCUTANEOUS

## 2011-10-21 NOTE — Progress Notes (Signed)
Patient ID: Charles Savage, male   DOB: [birth year], 56 y.o.   MRN: [id number] No complaints this evening Has ambulated BP 97/59  Pulse 102  Temp(Src) 98 F (36.7 C) (Oral)  Resp 27  Ht 5\' 6"  (1.676 m)  Wt 189 lb 2.5 oz (85.8 kg)  BMI 30.53 kg/m2  SpO2 100% Off drips Good response to lasix K 5.0 Cr 1.0] Hct 24  Stable day

## 2011-10-21 NOTE — Progress Notes (Signed)
SUBJECTIVE:  Looks remarkably well post op.  Mild pain.  Sitting up in a chair.   PHYSICAL EXAM Filed Vitals:   10/21/11 0600 10/21/11 0700 10/21/11 0800 10/21/11 1001  BP: 107/55 107/58 97/56 103/55  Pulse: 114 110 113 111  Temp: 99.3 F (37.4 C) 99.5 F (37.5 C) 99.5 F (37.5 C)   TempSrc: Core (Comment)  Core (Comment)   Resp: 35 19 23   Height:      Weight:  189 lb 2.5 oz (85.8 kg)    SpO2: 94% 99% 94%    General:  No distress Lungs: Decreased BS Heart:  Slight rub Abdomen:  Positive bowel sounds, no rebound no guarding Extremities:  Mild edema.  LABS:  Results for orders placed during the hospital encounter of 10/09/11 (from the past 24 hour(s))  POCT I-STAT 4, (NA,K, GLUC, HGB,HCT)     Status: Abnormal   Collection Time   10/20/11 11:25 AM      Component Value Range   Sodium 136  135 - 145 (mEq/L)   Potassium 5.1  3.5 - 5.1 (mEq/L)   Glucose, Bld 123 (*) 70 - 99 (mg/dL)   HCT 16.1 (*) 09.6 - 52.0 (%)   Hemoglobin 8.8 (*) 13.0 - 17.0 (g/dL)  POCT I-STAT 4, (NA,K, GLUC, HGB,HCT)     Status: Abnormal   Collection Time   10/20/11 12:24 PM      Component Value Range   Sodium 138  135 - 145 (mEq/L)   Potassium 4.5  3.5 - 5.1 (mEq/L)   Glucose, Bld 147 (*) 70 - 99 (mg/dL)   HCT 04.5 (*) 40.9 - 52.0 (%)   Hemoglobin 8.8 (*) 13.0 - 17.0 (g/dL)  HEMOGLOBIN AND HEMATOCRIT, BLOOD     Status: Abnormal   Collection Time   10/20/11 12:49 PM      Component Value Range   Hemoglobin 8.7 (*) 13.0 - 17.0 (g/dL)   HCT 81.1 (*) 91.4 - 52.0 (%)  PLATELET COUNT     Status: Abnormal   Collection Time   10/20/11 12:49 PM      Component Value Range   Platelets 131 (*) 150 - 400 (K/uL)  PREPARE PLATELET PHERESIS     Status: Normal (Preliminary result)   Collection Time   10/20/11 12:58 PM      Component Value Range   Unit Number [id number]     Blood Component Type PLTPHER LR2     Unit division 00     Status of Unit ISSUED     Transfusion Status OK TO TRANSFUSE    POCT I-STAT  4, (NA,K, GLUC, HGB,HCT)     Status: Abnormal   Collection Time   10/20/11  1:24 PM      Component Value Range   Sodium 138  135 - 145 (mEq/L)   Potassium 5.4 (*) 3.5 - 5.1 (mEq/L)   Glucose, Bld 143 (*) 70 - 99 (mg/dL)   HCT 08.6 (*) 57.8 - 52.0 (%)   Hemoglobin 8.2 (*) 13.0 - 17.0 (g/dL)  POCT I-STAT 4, (NA,K, GLUC, HGB,HCT)     Status: Abnormal   Collection Time   10/20/11  2:07 PM      Component Value Range   Sodium 138  135 - 145 (mEq/L)   Potassium 4.7  3.5 - 5.1 (mEq/L)   Glucose, Bld 191 (*) 70 - 99 (mg/dL)   HCT 46.9 (*) 62.9 - 52.0 (%)   Hemoglobin 8.8 (*) 13.0 - 17.0 (g/dL)  POCT  I-STAT 3, BLOOD GAS (G3+)     Status: Abnormal   Collection Time   10/20/11  2:10 PM      Component Value Range   pH, Arterial 7.376  7.350 - 7.450    pCO2 arterial 35.4  35.0 - 45.0 (mmHg)   pO2, Arterial 224.0 (*) 80.0 - 100.0 (mmHg)   Bicarbonate 20.8  20.0 - 24.0 (mEq/L)   TCO2 22  0 - 100 (mmol/L)   O2 Saturation 100.0     Acid-base deficit 4.0 (*) 0.0 - 2.0 (mmol/L)   Sample type ARTERIAL    APTT     Status: Abnormal   Collection Time   10/20/11  2:17 PM      Component Value Range   aPTT 45 (*) 24 - 37 (seconds)  PROTIME-INR     Status: Abnormal   Collection Time   10/20/11  2:17 PM      Component Value Range   Prothrombin Time 21.7 (*) 11.6 - 15.2 (seconds)   INR 1.85 (*) 0.00 - 1.49   FIBRINOGEN     Status: Normal   Collection Time   10/20/11  2:17 PM      Component Value Range   Fibrinogen 270  204 - 475 (mg/dL)  PREPARE FRESH FROZEN PLASMA     Status: Normal (Preliminary result)   Collection Time   10/20/11  2:44 PM      Component Value Range   Unit Number [id number]     Blood Component Type THAWED PLASMA     Unit division 00     Status of Unit ISSUED     Transfusion Status OK TO TRANSFUSE     Unit Number [id number]     Blood Component Type THAWED PLASMA     Unit division 00     Status of Unit ISSUED     Transfusion Status OK TO TRANSFUSE    CBC     Status: Abnormal    Collection Time   10/20/11  3:45 PM      Component Value Range   WBC 9.2  4.0 - 10.5 (K/uL)   RBC 2.40 (*) 4.22 - 5.81 (MIL/uL)   Hemoglobin 7.5 (*) 13.0 - 17.0 (g/dL)   HCT 78.2 (*) 95.6 - 52.0 (%)   MCV 92.1  78.0 - 100.0 (fL)   MCH 31.3  26.0 - 34.0 (pg)   MCHC 33.9  30.0 - 36.0 (g/dL)   RDW 21.3 (*) 08.6 - 15.5 (%)   Platelets 114 (*) 150 - 400 (K/uL)  PROTIME-INR     Status: Abnormal   Collection Time   10/20/11  3:45 PM      Component Value Range   Prothrombin Time 23.2 (*) 11.6 - 15.2 (seconds)   INR 2.02 (*) 0.00 - 1.49   APTT     Status: Abnormal   Collection Time   10/20/11  3:45 PM      Component Value Range   aPTT 40 (*) 24 - 37 (seconds)  POCT I-STAT 4, (NA,K, GLUC, HGB,HCT)     Status: Abnormal   Collection Time   10/20/11  3:48 PM      Component Value Range   Sodium 140  135 - 145 (mEq/L)   Potassium 4.3  3.5 - 5.1 (mEq/L)   Glucose, Bld 121 (*) 70 - 99 (mg/dL)   HCT 57.8 (*) 46.9 - 52.0 (%)   Hemoglobin 8.2 (*) 13.0 - 17.0 (g/dL)  POCT I-STAT 3, BLOOD GAS (G3+)  Status: Abnormal   Collection Time   10/20/11  3:53 PM      Component Value Range   pH, Arterial 7.321 (*) 7.350 - 7.450    pCO2 arterial 44.7  35.0 - 45.0 (mmHg)   pO2, Arterial 79.0 (*) 80.0 - 100.0 (mmHg)   Bicarbonate 23.6  20.0 - 24.0 (mEq/L)   TCO2 25  0 - 100 (mmol/L)   O2 Saturation 96.0     Acid-base deficit 3.0 (*) 0.0 - 2.0 (mmol/L)   Patient temperature 35.0 C     Collection site RADIAL, ALLEN'S TEST ACCEPTABLE     Drawn by Operator     Sample type ARTERIAL    GLUCOSE, CAPILLARY     Status: Abnormal   Collection Time   10/20/11  4:56 PM      Component Value Range   Glucose-Capillary 111 (*) 70 - 99 (mg/dL)  GLUCOSE, CAPILLARY     Status: Normal   Collection Time   10/20/11  6:00 PM      Component Value Range   Glucose-Capillary 97  70 - 99 (mg/dL)  GLUCOSE, CAPILLARY     Status: Abnormal   Collection Time   10/20/11  6:52 PM      Component Value Range   Glucose-Capillary 110  (*) 70 - 99 (mg/dL)  POCT I-STAT 3, BLOOD GAS (G3+)     Status: Abnormal   Collection Time   10/20/11  6:53 PM      Component Value Range   pH, Arterial 7.374  7.350 - 7.450    pCO2 arterial 43.9  35.0 - 45.0 (mmHg)   pO2, Arterial 81.0  80.0 - 100.0 (mmHg)   Bicarbonate 25.7 (*) 20.0 - 24.0 (mEq/L)   TCO2 27  0 - 100 (mmol/L)   O2 Saturation 96.0     Patient temperature 36.8 C     Collection site RADIAL, ALLEN'S TEST ACCEPTABLE     Drawn by Operator     Sample type ARTERIAL    CBC     Status: Abnormal   Collection Time   10/20/11  8:00 PM      Component Value Range   WBC 6.9  4.0 - 10.5 (K/uL)   RBC 2.31 (*) 4.22 - 5.81 (MIL/uL)   Hemoglobin 7.2 (*) 13.0 - 17.0 (g/dL)   HCT 16.1 (*) 09.6 - 52.0 (%)   MCV 93.1  78.0 - 100.0 (fL)   MCH 31.2  26.0 - 34.0 (pg)   MCHC 33.5  30.0 - 36.0 (g/dL)   RDW 04.5 (*) 40.9 - 15.5 (%)   Platelets 115 (*) 150 - 400 (K/uL)  CREATININE, SERUM     Status: Normal   Collection Time   10/20/11  8:00 PM      Component Value Range   Creatinine, Ser 0.82  0.50 - 1.35 (mg/dL)   GFR calc non Af Amer >90  >90 (mL/min)   GFR calc Af Amer >90  >90 (mL/min)  MAGNESIUM     Status: Abnormal   Collection Time   10/20/11  8:00 PM      Component Value Range   Magnesium 2.8 (*) 1.5 - 2.5 (mg/dL)  POCT I-STAT, CHEM 8     Status: Abnormal   Collection Time   10/20/11  8:03 PM      Component Value Range   Sodium 138  135 - 145 (mEq/L)   Potassium 5.7 (*) 3.5 - 5.1 (mEq/L)   Chloride 107  96 - 112 (mEq/L)  BUN 9  6 - 23 (mg/dL)   Creatinine, Ser 1.61  0.50 - 1.35 (mg/dL)   Glucose, Bld 096 (*) 70 - 99 (mg/dL)   Calcium, Ion 0.45  4.09 - 1.32 (mmol/L)   TCO2 23  0 - 100 (mmol/L)   Hemoglobin 6.5 (*) 13.0 - 17.0 (g/dL)   HCT 81.1 (*) 91.4 - 52.0 (%)   Comment NOTIFIED PHYSICIAN    POCT I-STAT 3, BLOOD GAS (G3+)     Status: Abnormal   Collection Time   10/20/11  8:11 PM      Component Value Range   pH, Arterial 7.359  7.350 - 7.450    pCO2 arterial 40.8   35.0 - 45.0 (mmHg)   pO2, Arterial 124.0 (*) 80.0 - 100.0 (mmHg)   Bicarbonate 23.1  20.0 - 24.0 (mEq/L)   TCO2 24  0 - 100 (mmol/L)   O2 Saturation 99.0     Acid-base deficit 2.0  0.0 - 2.0 (mmol/L)   Patient temperature 36.7 C     Collection site ARTERIAL LINE     Sample type ARTERIAL    PREPARE RBC (CROSSMATCH)     Status: Normal   Collection Time   10/20/11  9:00 PM      Component Value Range   Order Confirmation ORDER PROCESSED BY BLOOD BANK    GLUCOSE, CAPILLARY     Status: Abnormal   Collection Time   10/20/11  9:16 PM      Component Value Range   Glucose-Capillary 137 (*) 70 - 99 (mg/dL)  GLUCOSE, CAPILLARY     Status: Abnormal   Collection Time   10/20/11 10:15 PM      Component Value Range   Glucose-Capillary 130 (*) 70 - 99 (mg/dL)  GLUCOSE, CAPILLARY     Status: Abnormal   Collection Time   10/20/11 11:18 PM      Component Value Range   Glucose-Capillary 107 (*) 70 - 99 (mg/dL)  GLUCOSE, CAPILLARY     Status: Abnormal   Collection Time   10/21/11 12:12 AM      Component Value Range   Glucose-Capillary 107 (*) 70 - 99 (mg/dL)  GLUCOSE, CAPILLARY     Status: Abnormal   Collection Time   10/21/11  1:16 AM      Component Value Range   Glucose-Capillary 108 (*) 70 - 99 (mg/dL)  GLUCOSE, CAPILLARY     Status: Normal   Collection Time   10/21/11  2:26 AM      Component Value Range   Glucose-Capillary 89  70 - 99 (mg/dL)  CBC     Status: Abnormal   Collection Time   10/21/11  3:29 AM      Component Value Range   WBC 7.4  4.0 - 10.5 (K/uL)   RBC 2.64 (*) 4.22 - 5.81 (MIL/uL)   Hemoglobin 8.0 (*) 13.0 - 17.0 (g/dL)   HCT 78.2 (*) 95.6 - 52.0 (%)   MCV 89.0  78.0 - 100.0 (fL)   MCH 30.3  26.0 - 34.0 (pg)   MCHC 34.0  30.0 - 36.0 (g/dL)   RDW 21.3 (*) 08.6 - 15.5 (%)   Platelets 102 (*) 150 - 400 (K/uL)  BASIC METABOLIC PANEL     Status: Abnormal   Collection Time   10/21/11  3:29 AM      Component Value Range   Sodium 133 (*) 135 - 145 (mEq/L)   Potassium 5.0   3.5 - 5.1 (mEq/L)   Chloride  104  96 - 112 (mEq/L)   CO2 24  19 - 32 (mEq/L)   Glucose, Bld 98  70 - 99 (mg/dL)   BUN 9  6 - 23 (mg/dL)   Creatinine, Ser 1.61  0.50 - 1.35 (mg/dL)   Calcium 7.9 (*) 8.4 - 10.5 (mg/dL)   GFR calc non Af Amer >90  >90 (mL/min)   GFR calc Af Amer >90  >90 (mL/min)  MAGNESIUM     Status: Normal   Collection Time   10/21/11  3:29 AM      Component Value Range   Magnesium 2.3  1.5 - 2.5 (mg/dL)  GLUCOSE, CAPILLARY     Status: Normal   Collection Time   10/21/11  3:30 AM      Component Value Range   Glucose-Capillary 92  70 - 99 (mg/dL)  GLUCOSE, CAPILLARY     Status: Normal   Collection Time   10/21/11  4:33 AM      Component Value Range   Glucose-Capillary 98  70 - 99 (mg/dL)  GLUCOSE, CAPILLARY     Status: Abnormal   Collection Time   10/21/11  6:17 AM      Component Value Range   Glucose-Capillary 106 (*) 70 - 99 (mg/dL)  GLUCOSE, CAPILLARY     Status: Abnormal   Collection Time   10/21/11  7:52 AM      Component Value Range   Glucose-Capillary 107 (*) 70 - 99 (mg/dL)    Intake/Output Summary (Last 24 hours) at 10/21/11 1053 Last data filed at 10/21/11 1000  Gross per 24 hour  Intake 10286.27 ml  Output   6225 ml  Net 4061.27 ml    ASSESSMENT AND PLAN:  1)  CAD:  Now s/p MVR (28 mm Sorin Memo 3D annuloplasty ring) adn CABG x 6.    2)  Ischemic cardiomyopathy:  Off of dopamine and levophed.  Tolerating low dose beta blocker.  We will slowly resume CHF meds as tolerated.   Fayrene Fearing [facility] 10/21/2011 10:53 AM

## 2011-10-21 NOTE — Progress Notes (Signed)
1 Day Post-Op Procedure(s) (LRB): CORONARY ARTERY BYPASS GRAFTING (CABG) (N/A) MITRAL VALVE REPAIR (MVR) (N/A) Subjective: Some incisional pain and nausea  Objective: Vital signs in last 24 hours: Temp:  [95 F (35 Savage)-99.9 F (37.7 Savage)] 99.5 F (37.5 Savage) (04/27 0800) Pulse Rate:  [106-119] 113  (04/27 0800) Cardiac Rhythm:  [-] Sinus tachycardia (04/27 0800) Resp:  [8-35] 23  (04/27 0800) BP: (90-118)/(50-62) 97/56 mmHg (04/27 0800) SpO2:  [89 %-100 %] 94 % (04/27 0800) Arterial Line BP: (97-127)/(50-61) 110/52 mmHg (04/27 0800) FiO2 (%):  [39.7 %-100 %] 40 % (04/26 2200) Weight:  [171 lb 15.3 oz (78 kg)-189 lb 2.5 oz (85.8 kg)] 189 lb 2.5 oz (85.8 kg) (04/27 0700)  Hemodynamic parameters for last 24 hours: PAP: (38-62)/(20-30) 49/22 mmHg CO:  [5.4 L/min-8.3 L/min] 6.3 L/min CI:  [2.9 L/min/m2-4.4 L/min/m2] 3.3 L/min/m2  Intake/Output from previous day: 04/26 0701 - 04/27 0700 In: 10200.7 [P.O.:480; I.V.:6183.7; Blood:1837; NG/GT:30; IV Piggyback:1670] Out: 5885 [Urine:4125; Emesis/NG output:100; Blood:900; Chest Tube:760] Intake/Output this shift: Total I/O In: 76.5 [I.V.:76.5] Out: 180 [Urine:150; Chest Tube:30]  General appearance: alert and no distress Neurologic: intact Heart: RRR + rub Lungs: diminished breath sounds bibasilar Abdomen: normal findings: soft, non-tender  Lab Results:  Basename 10/21/11 0329 10/20/11 2003 10/20/11 2000  WBC 7.4 -- 6.9  HGB 8.0* 6.5* --  HCT 23.5* 19.0* --  PLT 102* -- 115*   BMET:  Basename 10/21/11 0329 10/20/11 2003 10/20/11 0550  NA 133* 138 --  K 5.0 5.7* --  CL 104 107 --  CO2 24 -- 25  GLUCOSE 98 117* --  BUN 9 9 --  CREATININE 0.84 0.80 --  CALCIUM 7.9* -- 9.1    PT/INR:  Basename 10/20/11 1545  LABPROT 23.2*  INR 2.02*   ABG    Component Value Date/Time   PHART 7.359 10/20/2011 2011   HCO3 23.1 10/20/2011 2011   TCO2 24 10/20/2011 2011   ACIDBASEDEF 2.0 10/20/2011 2011   O2SAT 99.0 10/20/2011 2011   CBG  (last 3)   Basename 10/21/11 0617 10/21/11 0433 10/21/11 0330  GLUCAP 106* 98 92    Assessment/Plan: S/P Procedure(s) (LRB): CORONARY ARTERY BYPASS GRAFTING (CABG) (N/A) MITRAL VALVE REPAIR (MVR) (N/A) POD # 1 CABG, mitral repair CV- stable- wean milrinone and levophed RESP- pulmonary toilet RENAL- lytes, creatinine OK, diurese ENDO- CBG well controlled D/Savage CT OOB, ambulate   LOS: 12 days    Charles Savage 10/21/2011

## 2011-10-21 NOTE — Progress Notes (Signed)
Anesthesiology Follow-up:  Mr. Siefert is awake and alert, sitting up in bed. Neurologically intact and hemodynamically stable on 0.25 mcg/kg/min on milrinone. Dopamine, Norepinephrine, and Phenylephrine now weaned off.  VS: T-37.7 BP-102/58 HR-109 (sinus tach) RR-25  O2 Sat 95% on 4L  Glucose 106, H/H 8.0/23 Plts: 102,000 K-5.0 Cr.- 0.84  Extubated at 19:09 yesterday (5 hours post-op)  Doing well so far, no apparent complications.  Kipp Brood, MD

## 2011-10-21 NOTE — Progress Notes (Signed)
Chest tubes removed per order, pt dangled prior to removal. Patient instructed to take a deep breath in and hold it during removal. vaseline occlusive dressing applied during and after removal. Patient tolerated well. Will continue to monitor.

## 2011-10-22 ENCOUNTER — Inpatient Hospital Stay (HOSPITAL_COMMUNITY): Payer: Medicaid Other

## 2011-10-22 LAB — GLUCOSE, CAPILLARY
Glucose-Capillary: 103 mg/dL — ABNORMAL HIGH (ref 70–99)
Glucose-Capillary: 111 mg/dL — ABNORMAL HIGH (ref 70–99)
Glucose-Capillary: 118 mg/dL — ABNORMAL HIGH (ref 70–99)

## 2011-10-22 LAB — BASIC METABOLIC PANEL
CO2: 27 mEq/L (ref 19–32)
Calcium: 8.1 mg/dL — ABNORMAL LOW (ref 8.4–10.5)
GFR calc non Af Amer: >90 mL/min (ref >90)
Potassium: 4.5 mEq/L (ref 3.5–5.1)
Sodium: 130 mEq/L — ABNORMAL LOW (ref 135–145)

## 2011-10-22 LAB — CBC
MCV: 89.9 fL (ref 78.0–100.0)
Platelets: 112 10*3/uL — ABNORMAL LOW (ref 150–400)
RBC: 2.37 MIL/uL — ABNORMAL LOW (ref 4.22–5.81)
WBC: 11.5 10*3/uL — ABNORMAL HIGH (ref 4.0–10.5)

## 2011-10-22 LAB — PREPARE RBC (CROSSMATCH)

## 2011-10-22 MED ORDER — SODIUM CHLORIDE 0.9 % IJ SOLN
3.0000 mL | Freq: Two times a day (BID) | INTRAMUSCULAR | Status: DC
  Administered 2011-10-22 – 2011-10-25 (×6): 3 mL via INTRAVENOUS

## 2011-10-22 MED ORDER — MOVING RIGHT ALONG BOOK
Freq: Once | Status: AC
Start: 2011-10-22 — End: 2011-10-22
  Administered 2011-10-22: 19:00:00
  Filled 2011-10-22 (×3): qty 1

## 2011-10-22 MED ORDER — SODIUM CHLORIDE 0.9 % IV SOLN: 250.0000 mL | INTRAVENOUS | Status: DC | PRN

## 2011-10-22 MED ORDER — GUAIFENESIN-DM 100-10 MG/5ML PO SYRP: 15.0000 mL | ORAL_SOLUTION | ORAL | Status: DC | PRN

## 2011-10-22 MED ORDER — FUROSEMIDE 10 MG/ML IJ SOLN
40.0000 mg | Freq: Once | INTRAMUSCULAR | Status: AC
  Administered 2011-10-22: 40 mg via INTRAVENOUS
  Filled 2011-10-22: qty 4

## 2011-10-22 MED ORDER — SODIUM CHLORIDE 0.9 % IJ SOLN
3.0000 mL | INTRAMUSCULAR | Status: DC | PRN
  Administered 2011-10-22: 3 mL via INTRAVENOUS

## 2011-10-22 MED ORDER — MAGNESIUM HYDROXIDE 400 MG/5ML PO SUSP
30.0000 mL | Freq: Every day | ORAL | Status: DC | PRN
  Administered 2011-10-23: 30 mL via ORAL
  Filled 2011-10-22: qty 30

## 2011-10-22 NOTE — Progress Notes (Signed)
2 Days Post-Op Procedure(s) (LRB): CORONARY ARTERY BYPASS GRAFTING (CABG) (N/A) MITRAL VALVE REPAIR (MVR) (N/A) Subjective: C/o soreness  Objective: Vital signs in last 24 hours: Temp:  [97.2 F (36.2 C)-99.1 F (37.3 C)] 97.2 F (36.2 C) (04/28 0734) Pulse Rate:  [100-114] 105  (04/28 0700) Cardiac Rhythm:  [-] Sinus tachycardia (04/27 2000) Resp:  [13-35] 14  (04/28 0700) BP: (80-116)/(45-69) 112/64 mmHg (04/28 0600) SpO2:  [92 %-100 %] 97 % (04/28 0700) Arterial Line BP: (104-124)/(50-56) 119/54 mmHg (04/27 1015) Weight:  [188 lb 0.8 oz (85.3 kg)] 188 lb 0.8 oz (85.3 kg) (04/28 0500)  Hemodynamic parameters for last 24 hours: PAP: (53-56)/(26) 54/26 mmHg  Intake/Output from previous day: 04/27 0701 - 04/28 0700 In: 1277.2 [P.O.:600; I.V.:577.2; IV Piggyback:100] Out: 2430 [Urine:2340; Chest Tube:90] Intake/Output this shift:    General appearance: alert and no distress Neurologic: intact Heart: regular rate and rhythm Lungs: diminished breath sounds bibasilar Abdomen: normal findings: soft, non-tender  Lab Results:  Basename 10/22/11 0400 10/21/11 1701 10/21/11 1700  WBC 11.5* -- 11.3*  HGB 7.1* 8.2* --  HCT 21.3* 24.0* --  PLT 112* -- 122*   BMET:  Basename 10/22/11 0400 10/21/11 1701 10/21/11 0329  NA 130* 133* --  K 4.5 5.2* --  CL 96 97 --  CO2 27 -- 24  GLUCOSE 108* 127* --  BUN 13 12 --  CREATININE 0.86 1.00 --  CALCIUM 8.1* -- 7.9*    PT/INR:  Basename 10/20/11 1545  LABPROT 23.2*  INR 2.02*   ABG    Component Value Date/Time   PHART 7.359 10/20/2011 2011   HCO3 23.1 10/20/2011 2011   TCO2 24 10/21/2011 1701   ACIDBASEDEF 2.0 10/20/2011 2011   O2SAT 99.0 10/20/2011 2011   CBG (last 3)   Basename 10/22/11 0736 10/22/11 0420 10/22/11 0021  GLUCAP 118* 111* 103*    Assessment/Plan: S/P Procedure(s) (LRB): CORONARY ARTERY BYPASS GRAFTING (CABG) (N/A) MITRAL VALVE REPAIR (MVR) (N/A) Plan for transfer to step-down: see transfer orders CV-  doing well, mild tachy and BP soft with Hgb 7.1 - transfuse RESP- pulmonary toilet RENAL- hyponatremia, BUN, Creatinine OK, continue diuresis CBG well controlled Transfer to PTCU later today   LOS: 13 days    Charles Savage C 10/22/2011

## 2011-10-22 NOTE — Progress Notes (Signed)
RIJ sleeve removed per order. Pt lied flat in bed and instructed to take a deep breath in and hold it during removal. Sterile technique used for removal. Vaseline, occlusive dressing applied during and after removal. Pressure held for 5 min, no bleeding noted. Pressure dressing with vaseline gauze applied.

## 2011-10-22 NOTE — Op Note (Signed)
Charles Savage, Charles Savage               ACCOUNT NO.:  [id number]  MEDICAL RECORD NO.:  [id number]  LOCATION:                                 FACILITY:  PHYSICIAN:  Evelene Croon, M.D.     DATE OF BIRTH:  [birth year]  DATE OF PROCEDURE:  10/20/2011 DATE OF DISCHARGE:                              OPERATIVE REPORT   PREOPERATIVE DIAGNOSES: 1. High-grade left main and severe multivessel coronary artery disease     with severe ischemic cardiomyopathy. 2. Severe mitral regurgitation.  POSTOPERATIVE DIAGNOSES: 1. High-grade left main and severe multivessel coronary artery disease     with severe ischemic cardiomyopathy. 2. Severe mitral regurgitation.  OPERATIVE PROCEDURES:  Median sternotomy, extracorporeal circulation, coronary artery bypass graft surgery x6 using a left internal mammary graft to the left anterior descending coronary artery with a sequential saphenous vein graft to the first, second, and third obtuse marginal branches of the left circumflex coronary artery, a saphenous vein graft to the posterior descending branch of the left circumflex coronary artery, and a saphenous vein graft to the right coronary artery.  Mitral valve repair using a 28-mm annuloplasty ring.  Endoscopic vein harvesting from the right leg.  ATTENDING SURGEON:  Evelene Croon, MD  ASSISTANT:  Coral Ceo, PA-C  ANESTHESIA:  General endotracheal.  CLINICAL HISTORY:  This patient is a 56 year old heavy smoker, who suffered a stroke with right hemiparesis and dysarthria in February 2013.  He underwent left carotid endarterectomy at [facility].  He had a small postoperative stroke.  He made a near-complete recovery from strokes but has had worsening shortness of breath since his carotid surgery and was admitted to [facility] from September 10, 2011, through September 20, 2011, with respiratory failure and diagnosis of COPD and healthcare-associated pneumonia.  This hospitalization was complicated by a  non-ST-segment elevation MI and acute renal failure.  An echocardiogram showed an ejection fraction of 40-45% with global hypokinesis and inferior akinesis.  There was severe mitral regurgitation.  He was managed medically due to his renal failure which resolved.  He was readmitted from September 28, 2011, through October 02, 2011, with recurrent shortness of breath and renal failure, felt to be due to persistent pneumonia.  An echocardiogram was repeated and showed an ejection fraction of 35-40% with moderate-to-severe mitral regurgitation.  He was then readmitted to [facility] on October 09, 2011, with a diagnosis of systolic heart failure and hypotension, and a chest x-ray showing patchy airspace disease suggesting pneumonia. He was transferred to [facility] and started on milrinone for presumed low cardiac output in the setting of ischemic cardiomyopathy and mitral regurgitation.  His breathing improved.  He underwent cardiac catheterization which showed severe left main and 3-vessel coronary artery disease with 3+ mitral regurgitation.  He then underwent a TEE a couple of days ago which showed only mild-to-moderate mitral regurgitation with a structurally normal mitral valve with some annular dilatation and severe left ventricular dysfunction.  After review of all these studies, it was felt that the only good option for treatment was coronary artery bypass graft surgery and mitral valve repair.  I discussed the operative procedure with the patient and his wife  and family.  We discussed alternatives, benefits, and risks including but not limited to, bleeding, blood transfusion, infection, stroke, myocardial infarction, organ dysfunction, renal failure, and death.  He understood all this and agreed to proceed.  OPERATIVE PROCEDURE:  The patient was taken to the operating room, placed on the table in supine position.  After induction of general endotracheal anesthesia, a Foley catheter  was placed in bladder using sterile technique.  Then, the chest, abdomen, and both lower extremities were prepped and draped in usual sterile manner.  TEE was performed and showed severe mitral regurgitation.  The mitral valve looks structurally sound except there was annular dilatation and failure of coaptation, particularly along the anterolateral portion of the valve.  There was severe left ventricular dysfunction.  In the operating room, the patient was noted to have hemodynamic lability.  His pulmonary artery pressure would go from being relatively normal to being almost systemic quite quickly which I suspect probably was due to ongoing ischemia and transient mitral valve dysfunction.  Then, the chest was opened through a median sternotomy incision and the pericardium opened midline.  Examination of the heart showed good right ventricular contractility.  The ascending aorta was of normal size and had no palpable plaques in it.  Then, the left internal mammary artery was harvested from the chest wall as pedicle graft.  This was a medium caliber vessel with excellent blood flow through it.  At the same time, a segment of greater saphenous vein was harvested from the right leg using endoscopic vein harvest technique.  This vein was of medium size and good quality.  Then, the patient was heparinized, and when an adequate ACT was obtained, the distal ascending aorta was cannulated using a 20-French aorta cannula for arterial inflow.  Venous outflow was achieved using bicaval venous cannulation.  A 20-French metal-tipped right angle cannula was placed through a pursestring suture in the superior vena cava, and a 36-French plastic right-angled cannula was placed in the inferior vena cava through a pursestring suture in the low right atrium. An antegrade cardioplegia and vent cannula was inserted in the aortic root.  A retrograde cardioplegia cannula was inserted into the coronary sinus,  but would not pass out more than about 1 cm, and after some manipulation and trying to get it to pass out further, it actually went through the wall of the coronary sinus causing perforation.  The retrograde cannula was, therefore, removed.  Then, the patient was placed on cardiopulmonary bypass and the distal coronaries identified.  The LAD was a large graftable vessel.  There was evidence of prior anterior infarction with some scar present.  All the diagonal branch was relatively small vessels.  The intermediate was a small nongraftable vessel where I could see it.  The first, second, and third marginal were all subbranches of a large marginal trunk.  All 3 of these were moderate-sized vessels that were graftable and all had stenoses on angiogram.  The left circumflex terminated as a moderate- sized posterior descending branch which was suitable for grafting with no distal disease in it.  The right coronary artery was basically nondominant vessel that gave off a moderate-sized branch that supplied the right ventricle and this was suitable for grafting.  Then, the aorta was crossclamped and 1000 mL of cold blood antegrade cardioplegia was administered in the aortic root with quick arrest of the heart.  Systemic hypothermia to 20 degrees Centigrade and topical hypothermic iced saline was used.  A temperature probe was  placed in the septum and insulating pad in the pericardium.  Since I obtained a good myocardial cooling even of the anterior wall using antegrade cardioplegia, I decided not to try to place the retrograde cannula in the coronary sinus.  Then, the heart was retracted to expose coronary sinus and there was a small perforation from the tip of the retrograde cannula.  This was easily fixed with a pledgeted 5-0 Prolene suture.  Then, the first distal anastomosis was performed to the posterior descending branch of the left circumflex coronary artery.  The internal diameter of  this vessel was about 1.75 mm.  The conduit used was a segment of greater saphenous vein and the anastomosis performed in end- to-side manner using continuous 7-0 Prolene suture.  The second distal anastomosis was performed to the first marginal branch.  The internal diameter was 1.6 mm.  The conduit used was a second segment of greater saphenous vein and the anastomosis performed in a sequential side-to-side manner using continuous 7-0 suture.  Flow was noted through the graft was excellent.  The third distal anastomosis was performed to the second marginal branch.  The internal diameter of this vessel was also about 1.6 mm. The conduit used was a same segment of greater saphenous vein and the anastomosis performed in a sequential side-to-side manner using continuous 7-0 Prolene suture.  Flow was noted through the graft and was excellent.  The fourth distal anastomosis was then performed to the third marginal branch.  The internal diameter was 1.6 mm.  The conduit used was a same segment of greater saphenous vein and the anastomosis performed in a sequential end-to-side manner using continuous 7-0 Prolene suture.  Flow was noted through this graft and was excellent.  Then, another dose of cardioplegia was given down the vein grafts and aortic root.  The fifth distal anastomosis was then performed to the acute marginal branch of the right coronary artery.  The internal diameter of this vessel was 1.75 mm.  The conduit used was a third segment of greater saphenous vein and the anastomosis performed in end-to-side manner using continuous 7-0 Prolene suture.  Flow was noted through the graft and was excellent.  The sixth distal anastomosis was performed to the mid LAD.  The internal diameter of this vessel was 2 mm.  The conduit used was the left internal mammary graft and this was brought through an opening in left pericardium anterior to the phrenic nerve.  This was anastomosed to  the LAD in an end-to-side manner using continuous 8-0 Prolene suture.  The pedicle was sutured to the epicardium with 6-0 Prolene sutures.  Then, another dose of cardioplegia was given.  Attention was turned to mitral valve repair.  The left atrium was then entered through a vertical incision in the interatrial groove.  Mitral retractor was placed.  There was excellent visualization of the valve.  The valve was checked with valve hooks and both leaflets appeared normal.  The subvalvular apparatus all appeared normal.  There were no areas of prolapse.  The valve was tested with iced saline solution, and there appeared to be true annular dilatation due to left ventricular dysfunction.  Then, a series of 2-0 Ethibond horizontal mattress sutures were placed around the mitral anulus.  The size of the anterior leaflet was measured, and a 28-mm Sorin 3D Memo annuloplasty ring was chosen. This had serial number [id number].  The catalog number was [id number].  Then, the suture was placed through the ring and the ring  already in place.  The suture was tied sequentially.  The rings seated nicely.  Then, the left ventricle was filled with iced saline solution and the mitral valve was completely competent with no leakage at all.  Then, the patient was rewarmed to 37 degrees Centigrade.  The left atriotomy incision was closed in 2 layers using continuous 3-0 Prolene suture.  Then, another dose of cardioplegia was given.  The 3 proximal vein graft anastomoses were performed to the mid ascending aorta in end-to-side manner using continuous 6-0 Prolene suture.  Then, the left side of the heart was de- aired.  The head was placed in a Trendelenburg position, and the cross- clamp was removed with a time of 178 minutes.  There was initially no rhythm, then slow return of sinus rhythm.  Two temporary right ventricular and right atrial pacing wires placed and brought through the skin.  The proximal and distal  anastomoses appeared hemostatic and allowed the grafts satisfactory.  When the patient rewarmed to 37 degrees Centigrade, he was weaned from cardiopulmonary bypass on milrinone at 0.25 mcg per kg per minute and dopamine at 3 mcg per kg per minute.  Total bypass time was 219 minutes.  Cardiac function appeared good with cardiac output of 5-6 L per minute.  TEE showed improved left ventricular function with normal right heart function.  There was no mitral regurgitation.  There was no aortic insufficiency.  Then, protamine was given and the venous and aortic cannulas removed without difficulty.  Hemostasis was achieved.  The patient was given 1 unit of platelets and 2 units of FFP due to some coagulopathy and having been on Plavix preoperatively.  Hemostasis was achieved.  Then, 3 chest tubes were placed with tube in the posterior pericardium, one in left pleural space, and 1 in the anterior mediastinum.  The sternum was then closed with double #6 stainless steel wires.  Fascia was closed with continuous #1 Vicryl suture.  Subcutaneous tissue was closed with continuous 2-0 Vicryl and the skin with a 3-0 Vicryl subcuticular closure.  The lower extremity vein harvest site was closed in layers in a similar manner. The sponge, needle, and instrument counts were correct according to the scrub nurse.  Dry sterile dressings were applied over the incisions around the chest tube which were hooked to Pleur-Evac suction.  The patient remained hemodynamically stable and was transferred to the SICU in guarded but stable condition.     Evelene Croon, M.D.     BB/MEDQ  D:  10/20/2011  T:  10/21/2011  Job:  [id number]

## 2011-10-22 NOTE — Progress Notes (Signed)
SUBJECTIVE:  Sore.  No acute SOB.  Ambulated this am.  PHYSICAL EXAM Filed Vitals:   10/22/11 0800 10/22/11 0900 10/22/11 0925 10/22/11 1000  BP: 97/59 100/67 92/54 88/57   Pulse: 106 106 103 108  Temp:  97.9 F (36.6 C) 98.1 F (36.7 C)   TempSrc:  Oral Oral   Resp: 19 20 15 20   Height:      Weight:      SpO2: 93% 97%  91%   General:  No distress Lungs: Few basilar coarse crackles Heart:  Slight rub Abdomen:  Positive bowel sounds, no rebound no guarding Extremities:  Mild edema.  LABS:  Results for orders placed during the hospital encounter of 10/09/11 (from the past 24 hour(s))  GLUCOSE, CAPILLARY     Status: Abnormal   Collection Time   10/21/11 11:57 AM      Component Value Range   Glucose-Capillary 117 (*) 70 - 99 (mg/dL)   Comment 1 Notify RN    GLUCOSE, CAPILLARY     Status: Abnormal   Collection Time   10/21/11  3:55 PM      Component Value Range   Glucose-Capillary 119 (*) 70 - 99 (mg/dL)   Comment 1 Notify RN    MAGNESIUM     Status: Normal   Collection Time   10/21/11  5:00 PM      Component Value Range   Magnesium 2.0  1.5 - 2.5 (mg/dL)  CBC     Status: Abnormal   Collection Time   10/21/11  5:00 PM      Component Value Range   WBC 11.3 (*) 4.0 - 10.5 (K/uL)   RBC 2.62 (*) 4.22 - 5.81 (MIL/uL)   Hemoglobin 7.9 (*) 13.0 - 17.0 (g/dL)   HCT 16.1 (*) 09.6 - 52.0 (%)   MCV 89.7  78.0 - 100.0 (fL)   MCH 30.2  26.0 - 34.0 (pg)   MCHC 33.6  30.0 - 36.0 (g/dL)   RDW 04.5 (*) 40.9 - 15.5 (%)   Platelets 122 (*) 150 - 400 (K/uL)  CREATININE, SERUM     Status: Abnormal   Collection Time   10/21/11  5:00 PM      Component Value Range   Creatinine, Ser 1.03  0.50 - 1.35 (mg/dL)   GFR calc non Af Amer 79 (*) >90 (mL/min)   GFR calc Af Amer >90  >90 (mL/min)  POCT I-STAT, CHEM 8     Status: Abnormal   Collection Time   10/21/11  5:01 PM      Component Value Range   Sodium 133 (*) 135 - 145 (mEq/L)   Potassium 5.2 (*) 3.5 - 5.1 (mEq/L)   Chloride 97  96  - 112 (mEq/L)   BUN 12  6 - 23 (mg/dL)   Creatinine, Ser 8.11  0.50 - 1.35 (mg/dL)   Glucose, Bld 914 (*) 70 - 99 (mg/dL)   Calcium, Ion 7.82  1.12 - 1.32 (mmol/L)   TCO2 24  0 - 100 (mmol/L)   Hemoglobin 8.2 (*) 13.0 - 17.0 (g/dL)   HCT 95.6 (*) 21.3 - 52.0 (%)  GLUCOSE, CAPILLARY     Status: Abnormal   Collection Time   10/21/11  7:32 PM      Component Value Range   Glucose-Capillary 126 (*) 70 - 99 (mg/dL)  GLUCOSE, CAPILLARY     Status: Abnormal   Collection Time   10/22/11 12:21 AM      Component Value Range  Glucose-Capillary 103 (*) 70 - 99 (mg/dL)  BASIC METABOLIC PANEL     Status: Abnormal   Collection Time   10/22/11  4:00 AM      Component Value Range   Sodium 130 (*) 135 - 145 (mEq/L)   Potassium 4.5  3.5 - 5.1 (mEq/L)   Chloride 96  96 - 112 (mEq/L)   CO2 27  19 - 32 (mEq/L)   Glucose, Bld 108 (*) 70 - 99 (mg/dL)   BUN 13  6 - 23 (mg/dL)   Creatinine, Ser 1.61  0.50 - 1.35 (mg/dL)   Calcium 8.1 (*) 8.4 - 10.5 (mg/dL)   GFR calc non Af Amer >90  >90 (mL/min)   GFR calc Af Amer >90  >90 (mL/min)  CBC     Status: Abnormal   Collection Time   10/22/11  4:00 AM      Component Value Range   WBC 11.5 (*) 4.0 - 10.5 (K/uL)   RBC 2.37 (*) 4.22 - 5.81 (MIL/uL)   Hemoglobin 7.1 (*) 13.0 - 17.0 (g/dL)   HCT 09.6 (*) 04.5 - 52.0 (%)   MCV 89.9  78.0 - 100.0 (fL)   MCH 30.0  26.0 - 34.0 (pg)   MCHC 33.3  30.0 - 36.0 (g/dL)   RDW 40.9 (*) 81.1 - 15.5 (%)   Platelets 112 (*) 150 - 400 (K/uL)  GLUCOSE, CAPILLARY     Status: Abnormal   Collection Time   10/22/11  4:20 AM      Component Value Range   Glucose-Capillary 111 (*) 70 - 99 (mg/dL)  GLUCOSE, CAPILLARY     Status: Abnormal   Collection Time   10/22/11  7:36 AM      Component Value Range   Glucose-Capillary 118 (*) 70 - 99 (mg/dL)  PREPARE RBC (CROSSMATCH)     Status: Normal   Collection Time   10/22/11  8:37 AM      Component Value Range   Order Confirmation ORDER PROCESSED BY BLOOD BANK      Intake/Output  Summary (Last 24 hours) at 10/22/11 1030 Last data filed at 10/22/11 1000  Gross per 24 hour  Intake 1157.55 ml  Output   2005 ml  Net -847.45 ml    ASSESSMENT AND PLAN:  1)  CAD:  Now s/p MVR (28 mm Sorin Memo 3D annuloplasty ring) adn CABG x 6.    2)  Ischemic cardiomyopathy:  Off of dopamine and levophed.  Tolerating low dose beta blocker.  BP low however. No further med titration. He will be transfused today.    Fayrene Fearing Shavonte Zhao 10/22/2011 10:30 AM

## 2011-10-23 ENCOUNTER — Encounter (HOSPITAL_COMMUNITY): Payer: Self-pay | Admitting: Surgery

## 2011-10-23 ENCOUNTER — Inpatient Hospital Stay (HOSPITAL_COMMUNITY): Payer: Medicaid Other

## 2011-10-23 LAB — TYPE AND SCREEN
Antibody Screen: NEGATIVE
Unit division: 0

## 2011-10-23 LAB — BASIC METABOLIC PANEL
BUN: 15 mg/dL (ref 6–23)
Calcium: 8.3 mg/dL — ABNORMAL LOW (ref 8.4–10.5)
GFR calc Af Amer: >90 mL/min (ref >90)
GFR calc non Af Amer: >90 mL/min (ref >90)
Glucose, Bld: 104 mg/dL — ABNORMAL HIGH (ref 70–99)
Potassium: 4.3 mEq/L (ref 3.5–5.1)
Sodium: 130 mEq/L — ABNORMAL LOW (ref 135–145)

## 2011-10-23 LAB — CBC
HCT: 24.4 % — ABNORMAL LOW (ref 39.0–52.0)
Hemoglobin: 8.1 g/dL — ABNORMAL LOW (ref 13.0–17.0)
MCH: 29.9 pg (ref 26.0–34.0)
MCHC: 33.2 g/dL (ref 30.0–36.0)
RDW: 15.3 % (ref 11.5–15.5)

## 2011-10-23 MED ORDER — LACTULOSE 10 GM/15ML PO SOLN
20.0000 g | Freq: Once | ORAL | Status: AC
  Administered 2011-10-23: 20 g via ORAL
  Filled 2011-10-23 (×2): qty 30

## 2011-10-23 MED ORDER — POTASSIUM CHLORIDE CRYS ER 20 MEQ PO TBCR
20.0000 meq | EXTENDED_RELEASE_TABLET | Freq: Once | ORAL | Status: AC
  Administered 2011-10-23: 20 meq via ORAL
  Filled 2011-10-23: qty 1

## 2011-10-23 MED ORDER — ENSURE COMPLETE PO LIQD
237.0000 mL | Freq: Every day | ORAL | Status: DC
  Administered 2011-10-23 – 2011-10-24 (×2): 237 mL via ORAL

## 2011-10-23 MED ORDER — FUROSEMIDE 40 MG PO TABS
40.0000 mg | ORAL_TABLET | Freq: Once | ORAL | Status: AC
  Administered 2011-10-23: 40 mg via ORAL
  Filled 2011-10-23: qty 1

## 2011-10-23 MED FILL — Fentanyl Citrate Inj 0.05 MG/ML: INTRAMUSCULAR | Qty: 2 | Status: AC

## 2011-10-23 MED FILL — Midazolam HCl Inj 2 MG/2ML (Base Equivalent): INTRAMUSCULAR | Qty: 2 | Status: AC

## 2011-10-23 MED FILL — Potassium Chloride Inj 2 mEq/ML: INTRAVENOUS | Qty: 40 | Status: AC

## 2011-10-23 MED FILL — Magnesium Sulfate Inj 50%: INTRAMUSCULAR | Qty: 10 | Status: AC

## 2011-10-23 NOTE — Progress Notes (Signed)
UR Completed.  Charles Savage Jane [phone] 10/23/2011  

## 2011-10-23 NOTE — Consult Note (Signed)
Saw pt back in February at last admission and had recommended patches for him to help him with quitting. Pt says he's been quit since Feb and has been using the patches as directed with no problems. Congratulated pt for quitting and encouraged to remain tobacco free. Pt is not having any cravings at the moment and says he doesn't require a patch. Reviewed relapse prevention strategies with pt. Referred to 1-800 quit now for f/u and support. Discussed oral fixation substitutes, second hand smoke and in home smoking policy. Reviewed and gave pt Written education/contact information.

## 2011-10-23 NOTE — Progress Notes (Signed)
INITIAL ADULT NUTRITION ASSESSMENT Date: 10/23/2011   Time: 12:23 PM  Reason for Assessment: poor PO intake  ASSESSMENT: Male 56 y.o.  Dx: Systolic congestive heart failure with reduced left ventricular function, NYHA class 2  Hx:  Past Medical History  Diagnosis Date  . Chronic pain     back pain  . Stroke   . Hyperlipemia   . Lumbar disc disease   . Carotid artery disease   . Myocardial infarct   . Pneumonia   . CHF (congestive heart failure)   . COPD (chronic obstructive pulmonary disease)     on 2L O2 at home since 08/2011  . Coronary artery disease   . Shortness of breath     Related Meds:     . acetaminophen  1,000 mg Oral Q6H   Or  . acetaminophen (TYLENOL) oral liquid 160 mg/5 mL  975 mg Per Tube Q6H  . aspirin EC  325 mg Oral Daily   Or  . aspirin  324 mg Per Tube Daily  . bisacodyl  10 mg Oral Daily   Or  . bisacodyl  10 mg Rectal Daily  . carvedilol  3.125 mg Oral BID WC  . docusate sodium  200 mg Oral Daily  . enoxaparin  40 mg Subcutaneous Q24H  . furosemide  40 mg Oral Once  . lactulose  20 g Oral Once  . moving right along book   Does not apply Once  . pantoprazole  40 mg Oral Q1200  . potassium chloride  20 mEq Oral Once  . sodium chloride  3 mL Intravenous Q12H  . DISCONTD: sodium chloride  3 mL Intravenous Q12H    Ht: 5\' 6"  (167.6 cm)  Wt: 188 lb 6.4 oz (85.458 kg)  Ideal Wt: 64.5 kg % Ideal Wt: 132%  Usual Wt: --- % Usual Wt: ---  Body mass index is 30.41 kg/(m^2). -- skewed due to edema  Food/Nutrition Related Hx: no triggers per admission nutrition screen  Labs:  CMP     Component Value Date/Time   NA 130* 10/23/2011 0509   K 4.3 10/23/2011 0509   CL 96 10/23/2011 0509   CO2 26 10/23/2011 0509   GLUCOSE 104* 10/23/2011 0509   BUN 15 10/23/2011 0509   CREATININE 0.95 10/23/2011 0509   CALCIUM 8.3* 10/23/2011 0509   PROT 7.4 10/10/2011 0500   ALBUMIN 3.5 10/10/2011 0500   AST 47* 10/10/2011 0500   ALT 52 10/10/2011 0500   ALKPHOS  102 10/10/2011 0500   BILITOT 0.8 10/10/2011 0500   GFRNONAA >90 10/23/2011 0509   GFRAA >90 10/23/2011 0509     Intake/Output Summary (Last 24 hours) at 10/23/11 1230 Last data filed at 10/23/11 0900  Gross per 24 hour  Intake    480 ml  Output   1800 ml  Net  -1320 ml    CBG (last 3)   Basename 10/22/11 0736 10/22/11 0420 10/22/11 0021  GLUCAP 118* 111* 103*    Diet Order: Heart Healthy  Supplements/Tube Feeding: N/A  IVF:    DISCONTD: sodium chloride Last Rate: Stopped (10/21/11 0903)  DISCONTD: sodium chloride Last Rate: Stopped (10/20/11 1530)  DISCONTD: sodium chloride Last Rate: 250 mL (10/21/11 1610)  DISCONTD: dexmedetomidine (PRECEDEX) IV infusion Last Rate: Stopped (10/21/11 0300)  DISCONTD: DOPamine Last Rate: Stopped (10/20/11 1800)  DISCONTD: insulin (NOVOLIN-R) infusion Last Rate: Stopped (10/21/11 0430)  DISCONTD: lactated ringers Last Rate: Stopped (10/22/11 1400)  DISCONTD: milrinone Last Rate: Stopped (10/21/11 1032)  DISCONTD: nitroGLYCERIN   DISCONTD: norepinephrine (LEVOPHED) Adult infusion Last Rate: Stopped (10/21/11 0955)  DISCONTD: phenylephrine (NEO-SYNEPHRINE) Adult infusion Last Rate: Stopped (10/20/11 1609)    Estimated Nutritional Needs:   Kcal: 1900-2100 Protein: 95-105 gm Fluid: 1.9-2.1 L  RD spoke with patient re: nutrition hx; s/p median sternotomy, extracorporeal circulation, 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  NUTRITION DIAGNOSIS: -Inadequate oral intake (NI-2.1).  Status: Ongoing  RELATED TO: decreased appetite  AS EVIDENCE BY: PO intake 50%  MONITORING/EVALUATION(Goals): Goal: Oral intake to meet >90% of  estimated nutrition needs to promote post-op healing Monitor: PO & supplemental intake, weight, labs, I/O's  EDUCATION NEEDS: -No education needs identified at this time  INTERVENTION:  Add Vanilla Ensure Complete daily (350 kcals, 13 gm protein per 8 fl oz bottle)  RD to follow for nutrition care plan  Dietitian #: [phone]  DOCUMENTATION CODES Per approved criteria  -Not Applicable    Charles Savage 10/23/2011, 12:23 PM

## 2011-10-23 NOTE — Progress Notes (Addendum)
3 Days Post-Op Procedure(s) (LRB): CORONARY ARTERY BYPASS GRAFTING (CABG) (N/A) MITRAL VALVE REPAIR (MVR) (N/A)  Subjective: Patient feeling ok this am. Had some weird dreams last night (not given sleeping pill). Passing flatus but no bm yet and does have a productive cough.  Objective: Vital signs in last 24 hours: Patient Vitals for the past 24 hrs:  BP Temp Temp src Pulse Resp SpO2 Weight  10/23/11 0434 94/56 mmHg 98.3 F (36.8 C) Oral 102  18  90 % 188 lb 6.4 oz (85.458 kg)  10/22/11 2030 95/64 mmHg 98.8 F (37.1 C) Oral 105  18  100 % -  10/22/11 1656 102/58 mmHg - - 107  - - -  10/22/11 1500 92/51 mmHg - - - - - -  10/22/11 1453 86/63 mmHg 98.3 F (36.8 C) Oral 100  16  95 % -  10/22/11 1400 100/62 mmHg - - 106  16  100 % -  10/22/11 1300 - - - 106  18  97 % -  10/22/11 1200 110/64 mmHg - - 106  19  99 % -  10/22/11 1130 94/59 mmHg 97.7 F (36.5 C) Oral 103  10  - -  10/22/11 1115 92/54 mmHg 98 F (36.7 C) - 104  18  - -  10/22/11 1100 95/55 mmHg - - 104  15  98 % -  10/22/11 1030 89/55 mmHg 98.1 F (36.7 C) Oral 104  11  97 % -  10/22/11 1000 88/57 mmHg - - 108  20  91 % -  10/22/11 0925 92/54 mmHg 98.1 F (36.7 C) Oral 103  15  - -  10/22/11 0900 100/67 mmHg 97.9 F (36.6 C) Oral 106  20  97 % -  10/22/11 0800 97/59 mmHg - - 106  19  93 % -  10/22/11 0734 - 97.2 F (36.2 C) Axillary - - - -   Pre op weight  77.6 kg Current Weight  10/23/11 188 lb 6.4 oz (85.458 kg)      Intake/Output from previous day: 04/28 0701 - 04/29 0700 In: 1175 [P.O.:520; I.V.:301; Blood:350; IV Piggyback:4] Out: 2465 [Urine:2465]   Physical Exam:  Cardiovascular: RRR;no murmurs, gallops, or rubs. Pulmonary: Diminished at bases bilaterally; no rales, wheezes, or rhonchi. Abdomen: Soft, non tender, bowel sounds present. Extremities: Bilateral lower extremity edema. Wounds: Clean and dry.  No erythema or signs of infection.  Lab Results: CBC: Basename 10/23/11 0509 10/22/11  0400  WBC 11.5* 11.5*  HGB 8.1* 7.1*  HCT 24.4* 21.3*  PLT 133* 112*   BMET:  Basename 10/23/11 0509 10/22/11 0400  NA 130* 130*  K 4.3 4.5  CL 96 96  CO2 26 27  GLUCOSE 104* 108*  BUN 15 13  CREATININE 0.95 0.86  CALCIUM 8.3* 8.1*    PT/INR:  Basename 10/20/11 1545  LABPROT 23.2*  INR 2.02*   ABG:  INR: Will add last result for INR, ABG once components are confirmed Will add last 4 CBG results once components are confirmed  Assessment/Plan:  1. CV - SR/ST this am. Had increase in HR into 140's briefly around 2:56 am.On Coreg 3.125 bid with parameters. 2.  Pulmonary - Encourage incentive spirometer.Wean O2 as tolerates.CXR this am shows improvement in bibasilar atelectasis, no ptx. 3. Volume Overload - Diurese as bp allows. 4.  Acute blood loss anemia - H/H this am slightly increased to 8.1/24.4.Received transfusion yesterday. 5.Mild thrombocytopenia-platelets increased to 133,000. 6.LOC constipation. 7.Guaf/dextromethorphan for cough.    ZIMMERMAN,DONIELLE  MPA-C 10/23/2011   seen and examined, agree with above

## 2011-10-23 NOTE — Progress Notes (Signed)
CARDIAC REHAB PHASE I   PRE:  Rate/Rhythm: 96 SR  BP:  Supine: 93/63  Sitting:   Standing:    SaO2: 98 2L  MODE:  Ambulation: 350 ft   POST:  Rate/Rhythem: 110 ST  BP:  Supine:   Sitting: 93/49  Standing:    SaO2: 96 2L 0920-1005 Assisted X 2 used O2 2L and walker to ambulate. Pt groggy but arouse easily. Walked 350 feet with encouragement. VS stable. Pt to recliner after walk with call light in reach. O2 decreased to 1L after walk. Encouraged use of IS and two more walks today also staying OOB and in chair.  Beatrix Fetters

## 2011-10-24 MED ORDER — GUAIFENESIN ER 600 MG PO TB12: 600.0000 mg | ORAL_TABLET | Freq: Two times a day (BID) | ORAL | Status: DC | PRN

## 2011-10-24 MED ORDER — FUROSEMIDE 40 MG PO TABS: 40.0000 mg | ORAL_TABLET | Freq: Every day | ORAL | Status: DC

## 2011-10-24 MED ORDER — TRAMADOL HCL 50 MG PO TABS
50.0000 mg | ORAL_TABLET | ORAL | Status: DC | PRN
  Administered 2011-10-24: 100 mg via ORAL
  Administered 2011-10-24: 50 mg via ORAL
  Administered 2011-10-25: 100 mg via ORAL
  Filled 2011-10-24: qty 2
  Filled 2011-10-24: qty 1
  Filled 2011-10-24: qty 2

## 2011-10-24 MED ORDER — FUROSEMIDE 40 MG PO TABS
40.0000 mg | ORAL_TABLET | Freq: Every day | ORAL | Status: DC
  Administered 2011-10-24: 40 mg via ORAL
  Filled 2011-10-24 (×2): qty 1

## 2011-10-24 MED ORDER — POTASSIUM CHLORIDE CRYS ER 20 MEQ PO TBCR: 20.0000 meq | EXTENDED_RELEASE_TABLET | Freq: Every day | ORAL | Status: DC

## 2011-10-24 MED ORDER — HYDROCODONE-ACETAMINOPHEN 10-325 MG PO TABS: 1.0000 | ORAL_TABLET | Freq: Three times a day (TID) | ORAL | Status: DC | PRN

## 2011-10-24 MED ORDER — ASPIRIN 325 MG PO TBEC: 325.0000 mg | DELAYED_RELEASE_TABLET | Freq: Every day | ORAL | Status: DC

## 2011-10-24 MED ORDER — POTASSIUM CHLORIDE CRYS ER 20 MEQ PO TBCR
20.0000 meq | EXTENDED_RELEASE_TABLET | Freq: Every day | ORAL | Status: DC
  Administered 2011-10-24 – 2011-10-25 (×2): 20 meq via ORAL
  Filled 2011-10-24 (×2): qty 1

## 2011-10-24 MED FILL — Heparin Sodium (Porcine) Inj 1000 Unit/ML: INTRAMUSCULAR | Qty: 10 | Status: AC

## 2011-10-24 MED FILL — Heparin Sodium (Porcine) Inj 1000 Unit/ML: INTRAMUSCULAR | Qty: 30 | Status: AC

## 2011-10-24 MED FILL — Mannitol IV Soln 20%: INTRAVENOUS | Qty: 500 | Status: AC

## 2011-10-24 MED FILL — Sodium Chloride IV Soln 0.9%: INTRAVENOUS | Qty: 1000 | Status: AC

## 2011-10-24 MED FILL — Electrolyte-R (PH 7.4) Solution: INTRAVENOUS | Qty: 6000 | Status: AC

## 2011-10-24 MED FILL — Sodium Chloride Irrigation Soln 0.9%: Qty: 3000 | Status: AC

## 2011-10-24 MED FILL — Sodium Bicarbonate IV Soln 8.4%: INTRAVENOUS | Qty: 50 | Status: AC

## 2011-10-24 MED FILL — Lidocaine HCl IV Inj 20 MG/ML: INTRAVENOUS | Qty: 5 | Status: AC

## 2011-10-24 NOTE — Progress Notes (Signed)
EPW's removed per order and unit protocol.  All tips intact, sites painted.  Pt tolerated fair.  HOB at 30 degrees, Pt understands bedrest for one hour, MT notified.  VSS, q28min x 4.  Please note CT sutures remain.  Will monitor closely.

## 2011-10-24 NOTE — Discharge Instructions (Signed)
Activity: 1.May walk up steps                2.No lifting more than ten pounds for four weeks.                 3.No driving for four weeks.                4.Stop any activity that causes chest pain, shortness of breath, dizziness, sweating or excessive weakness.                5.Avoid straining.                6.Continue with your breathing exercises daily.  Diet: Low fat, Low salt diet  Wound Care: May shower.  Clean wounds with mild soap and water daily. Contact the office at [phone] if any problems arise.  Coronary Artery Bypass Grafting Care After Refer to this sheet in the next few weeks. These instructions provide you with information on caring for yourself after your procedure. Your caregiver may also give you more specific instructions. Your treatment has been planned according to current medical practices, but problems sometimes occur. Call your caregiver if you have any problems or questions after your procedure.  Recovery from open heart surgery will be different for everyone. Some people feel well after 3 or 4 weeks, while for others it takes longer. After heart surgery, it may be normal to:  Not have an appetite, feel nauseated by the smell of food, or only want to eat a small amount.   Be constipated because of changes in your diet, activity, and medicines. Eat foods high in fiber. Add fresh fruits and vegetables to your diet. Stool softeners may be helpful.   Feel sad or unhappy. You may be frustrated or cranky. You may have good days and bad days. Do not give up. Talk to your caregiver if you do not feel better.   Feel weakness and fatigue. You many need physical therapy or cardiac rehabilitation to get your strength back.   Develop an irregular heartbeat called atrial fibrillation. Symptoms of atrial fibrillation are a fast, irregular heartbeat or feelings of fluttery heartbeats, shortness of breath, low blood pressure, and dizziness. If these symptoms develop, see your  caregiver right away.  MEDICATION  Have a list of all the medicines you will be taking when you leave the hospital. For every medicine, know the following:   Name.   Exact dose.   Time of day to be taken.   How often it should be taken.   Why you are taking it.   Ask which medicines should or should not be taken together. If you take more than one heart medicine, ask if it is okay to take them together. Some heart medicines should not be taken at the same time because they may lower your blood pressure too much.   Narcotic pain medicine can cause constipation. Eat fresh fruits and vegetables. Add fiber to your diet. Stool softener medicine may help relieve constipation.   Keep a copy of your medicines with you at all times.   Do not add or stop taking any medicine until you check with your caregiver.   Medicines can have side effects. Call your caregiver who prescribed the medicine if you:   Start throwing up, have diarrhea, or have stomach pain.   Feel dizzy or lightheaded when you stand up.   Feel your heart is skipping beats or is beating too fast   or too slow.   Develop a rash.   Notice unusual bruising or bleeding.  HOME CARE INSTRUCTIONS  After heart surgery, it is important to learn how to take your pulse. Have your caregiver show you how to take your pulse.   Use your incentive spirometer. Ask your caregiver how long after surgery you need to use it.  Care of your chest incision  Tell your caregiver right away if you notice clicking in your chest (sternum).   Support your chest with a pillow or your arms when you take deep breaths and cough.   Follow your caregiver's instructions about when you can bathe or swim.   Protect your incision from sunlight during the first year to keep the scar from getting dark.   Tell your caregiver if you notice:   Increased tenderness of your incision.   Increased redness or swelling around your incision.   Drainage or pus  from your incision.  Care of your leg incision(s)  Avoid crossing your legs.   Avoid sitting for long periods of time. Change positions every half hour.   Elevate your leg(s) when you are sitting.   Check your leg(s) daily for swelling. Check the incisions for redness or drainage.   Wear your elastic stockings as told by your caregiver. Take them off at bedtime.  Diet  Diet is very important to heart health.   Eat plenty of fresh fruits and vegetables. Meats should be lean cut. Avoid canned, processed, and fried foods.   Talk to a dietician. They can teach you how to make healthy food and drink choices.  Weight  Weigh yourself every day. This is important because it helps to know if you are retaining fluid that may make your heart and lungs work harder.   Use the same scale each time.   Weigh yourself every morning at the same time. You should do this after you go to the bathroom, but before you eat breakfast.   Your weight will be more accurate if you do not wear any clothes.   Record your weight.   Tell your caregiver if you have gained 2 pounds or more overnight.  Activity Stop any activity at once if you have chest pain, shortness of breath, irregular heartbeats, or dizziness. Get help right away if you have any of these symptoms.  Bathing.  Avoid soaking in a bath or hot tub until your incisions are healed.   Rest. You need a balance of rest and activity.   Exercise. Exercise per your caregiver's advice. You may need physical therapy or cardiac rehabilitation to help strengthen your muscles and build your endurance.   Climbing stairs. Unless your caregiver tells you not to climb stairs, go up stairs slowly and rest if you tire. Do not pull yourself up by the handrail.   Driving a car. Follow your caregiver's advice on when you may drive. You may ride as a passenger at any time. When traveling for long periods of time in a car, get out of the car and walk around for a  few minutes every 2 hours.   Lifting. Avoid lifting, pushing, or pulling anything heavier than 10 pounds for 6 weeks after surgery or as told by your caregiver.   Returning to work. Check with your caregiver. People heal at different rates. Most people will be able to go back to work 6 to 12 weeks after surgery.   Sexual activity. You may resume sexual relations as told by your   caregiver.  SEEK MEDICAL CARE IF:  Any of your incisions are red, painful, or have any type of drainage coming from them.   You have an oral temperature above 102 F (38.9 C).   You have ankle or leg swelling.   You have pain in your legs.   You have weight gain of 2 or more pounds a day.   You feel dizzy or lightheaded when you stand up.  SEEK IMMEDIATE MEDICAL CARE IF:  You have angina or chest pain that goes to your jaw or arms. Call your local emergency services right away.   You have shortness of breath at rest or with activity.   You have a fast or irregular heartbeat (arrhythmia).   There is a "clicking" in your sternum when you move.   You have numbness or weakness in your arms or legs.  MAKE SURE YOU:  Understand these instructions.   Will watch your condition.   Will get help right away if you are not doing well or get worse.  Document Released: 12/30/2004 Document Revised: 06/01/2011 Document Reviewed: 08/17/2010 [employer] Patient Information 2012 [employer].   

## 2011-10-24 NOTE — Progress Notes (Addendum)
4 Days Post-Op Procedure(s) (LRB): CORONARY ARTERY BYPASS GRAFTING (CABG) (N/A) MITRAL VALVE REPAIR (MVR) (N/A)  Subjective: Patient with bowel movement.States he got sick to his stomach and confused after taking Percocet yesterday;although he tolerated it previously.  Objective: Vital signs in last 24 hours: Patient Vitals for the past 24 hrs:  BP Temp Temp src Pulse Resp SpO2 Weight  10/24/11 0616 99/62 mmHg 97.4 F (36.3 C) Oral 100  19  100 % 185 lb 10 oz (84.2 kg)  10/23/11 2016 92/63 mmHg 97.8 F (36.6 C) Oral 102  20  - -  10/23/11 1320 97/64 mmHg - - 102  18  93 % -   Pre op weight  77.6 kg Current Weight  10/24/11 185 lb 10 oz (84.2 kg)      Intake/Output from previous day: 04/29 0701 - 04/30 0700 In: 240 [P.O.:240] Out: 901 [Urine:900; Stool:1]   Physical Exam:  Cardiovascular: RRR;no murmurs, gallops, or rubs. Pulmonary: Diminished at bases bilaterally; no rales, wheezes, or rhonchi. Abdomen: Soft, non tender, bowel sounds present. Extremities: Bilateral lower extremity edema. Wounds: Clean and dry.  No erythema or signs of infection. Neurologic: Grossly intact without focal deficits.  Lab Results: CBC:  Basename 10/23/11 0509 10/22/11 0400  WBC 11.5* 11.5*  HGB 8.1* 7.1*  HCT 24.4* 21.3*  PLT 133* 112*   BMET:   Basename 10/23/11 0509 10/22/11 0400  NA 130* 130*  K 4.3 4.5  CL 96 96  CO2 26 27  GLUCOSE 104* 108*  BUN 15 13  CREATININE 0.95 0.86  CALCIUM 8.3* 8.1*    PT/INR: No results found for this basename: LABPROT,INR in the last 72 hours ABG:  INR: Will add last result for INR, ABG once components are confirmed Will add last 4 CBG results once components are confirmed  Assessment/Plan:  1. CV - SR/ST this am. .On Coreg 3.125 bid.Would like to increase for better HR control, but sbp in the 90's. 2.  Pulmonary - Encourage incentive spirometer.WEaned off O2. 3. Volume Overload - Diurese as bp allows. 4.  Acute blood loss anemia - Last   H/H  8.1/24.4.Previously received transfusion. 5.Mild thrombocytopenia-platelets 133,000. 6.Guaf/dextromethorphan for cough. 7.Stop Oxycodone and try Ultram. 8.Remove EPW. 9.Probable discharge in am.    ZIMMERMAN,DONIELLE MPA-C June 21, 202013  Plan D/C Wednesday I have seen and examined Abhay V Whitener and agree with the above assessment  and plan.  Delight Ovens MD Beeper [phone] Office [phone] June 21, 202013 10:58 AM

## 2011-10-24 NOTE — Discharge Summary (Signed)
Physician Discharge Summary  Patient ID: KALYB PEMBLE MRN: [id number] DOB/AGE: [birth year] 57 y.o.  Admit date: 10/09/2011 Discharge date: 10/25/2011  Admission Diagnoses: 1.CAD (left main included) 2.History of  NSTEMI (March 2013) 3.Ischemic cardiomyopathy 4.Severe mitral regurgitation 5.History of CVA (s/p left carotid endarterectomy) 6.COPD 7.History of hyperlipidemia 8.History of CHF (NYHA class 2) 9.History of pneumonia 10.History of tobacco abuse (recently quit) 11.History of acute renal failure  Discharge Diagnoses:  1.CAD (left main included) 2.History of  NSTEMI (March 2013) 3.Ischemic cardiomyopathy 4.Severe mitral regurgitation 5.History of CVA (s/p left carotid endarterectomy) 6.COPD 7.History of hyperlipidemia 8.History of CHF (NYHA class 2) 9.History of pneumonia 10.History of tobacco abuse (recently quit) 11.History of acute renal failure 12.ABL anemia 13.Mild thrombocytopenia  Procedure (s):  1.Cardiac catheterization done by Dr. Gala Romney on 10/16/2011: Left main: 80-90% distal  LAD: Flush occlusion at ostium. Mild filling in mid and distal segments through L to L and R to L collaterals  LCX: Large. Dominant. 70-80% mid AV groove. OM-1 small to moderate vessel with mild ostial disease. OM-2 very large 99% lesion in mid section at trifurcation. 90% lesion in lowest branch. PDA 50-60 ostial  RCA: Small to moderate-sized, non- dominant vessel ending with acute marginal branch. Diffuse 60% prox to mid. 95% midsection. R to L collats to LAD from acute marginal.  LV-gram done in the RAO projection: Ejection fraction = 25-30% with global HK 3+ MR  L subclavian: Widely patent to chest wall with mild plaquing  2.Median sternotomy, extracorporeal circulation,  coronary artery bypass graft surgery x6 using a left internal mammary graft to the left anterior descending coronary artery with a sequential saphenous vein graft to the first, second, and third obtuse  marginal branches of the left circumflex coronary artery, a saphenous vein graft  to the posterior descending branch of the left circumflex coronary artery, and a saphenous vein graft to the right coronary artery. Mitral valve repair using a 28-mm annuloplasty ring. Endoscopic vein harvesting from the right leg by Dr. Laneta Simmers on 10/20/2011.   History of Presenting Illness: This is a 56 year old heavy smoker who suffered a left brain stroke with right hemiparesis and dysarthria in February 2013. He initially presented to [facility] and was transferred to [facility] where he underwent left carotid endarterectomy. He apparently suffered a small perioperative stroke. He has made a near complete recovery from his strokes. He reports that since his carotid surgery, he has had worsening shortness of breath. He was admitted to [facility] from 3/17- 09/20/2011 with respiratory failure and a diagnosis of COPD and health care- associated pneumonia. This hospitalization was complicated by a non-ST segment elevation MI and acute renal failure. An echocardiogram showed an ejection fraction of 40-45% with global hypokinesis and inferior akinesis and there was severe mitral regurgitation. He was managed medically due to his renal failure, which resolved. He was readmitted from 4/4-4/8 with recurrent shortness of breath and renal failure felt to be due to persistent pneumonia. An echocardiogram was repeated and showed ejection fraction of 35-40% with moderate to severe mitral regurgitaion. He was then readmitted to [facility] on 10/09/2011 with a diagnosis of systolic heart failure and hypotension and a chest x-ray showing patchy air space disease suggesting pneumonia. He was transferred to [facility] and started on milrinone 4/19 for presumed low cardiac output in the setting of ischemic cardiomyopathy and mitral regurgitation. His breathing did improve. He underwent cardiac catheterization 10/16/2011  showing high-grade left main and severe three-vessel coronary  disease with 3+ mitral regurgitation.A cardiothoracic consultation was obtained with Dr. Laneta Simmers for consideration of CABG and mitral valve repair vs. Replacement. Potential risks, complications, and benefits were discussed with the patient and he agreed to proceed.Pre operative duplex carotid US showed no significant internal carotid artery stenosis bilaterally (L CEA patent). He underwent a CABGx6 and MV repair on 10/20/2011.  Brief Hospital Course:  Patient was extubated without difficulty early the evening of surgery. His Theone Murdoch, a line, chest tubes, and foley were all removed early in his post operative course.He was gradually weaned off Phenylephrine, dopamine, and norepinephrine.His H/H went as low as 7.1/21.3.He was transfused. His last H/H was up to 8.1/24.4. He was started on Ferrous Sulfate.He was also found to have mild thrombocytopenia. His platelet count went as low 112,000 but was last up to 133,000. He was restarted on Coreg.He was felt surgically stable for transfer from ICU to [facility] for further convalescence on 10/22/2011.He was initially requiring a couple liters of oxygen via nasal cannula. He was able to be weaned to room air. He has already been tolerating a diet and has had a bowel movement. Epicardial pacing wires are going to be removed today. Chest tube sutures will be removed in the am. Provided he remains afebrile, hemodynamically stable, and pending morning round evaluation, he will be stable for discharge on 10/25/2011.  Latest Vital Signs: Blood pressure 99/62, pulse 100, temperature 97.4 F (36.3 C), temperature source Oral, resp. rate 19, height 5\' 6"  (1.676 m), weight 185 lb 10 oz (84.2 kg), SpO2 100.00%.  Physical Exam: Cardiovascular: RRR;no murmurs, gallops, or rubs.  Pulmonary: Diminished at bases bilaterally; no rales, wheezes, or rhonchi.  Abdomen: Soft, non tender, bowel sounds present.  Extremities:  Bilateral lower extremity edema.  Wounds: Clean and dry. No erythema or signs of infection.  Neurologic: Grossly intact without focal deficits.  Discharge Condition:Stable  Recent laboratory studies:  Lab Results  Component Value Date   WBC 11.5* 10/23/2011   HGB 8.1* 10/23/2011   HCT 24.4* 10/23/2011   MCV 90.0 10/23/2011   PLT 133* 10/23/2011   Lab Results  Component Value Date   NA 130* 10/23/2011   K 4.3 10/23/2011   CL 96 10/23/2011   CO2 26 10/23/2011   CREATININE 0.95 10/23/2011   GLUCOSE 104* 10/23/2011      Diagnostic Studies: Dg Chest 2 View  10/23/2011  *RADIOLOGY REPORT*  Clinical Data: Post CABG, chest soreness  CHEST - 2 VIEW  Comparison: Portable chest x-ray of 10/22/2011  Findings: Aeration of the lungs has improved.  There is still pulmonary vascular congestion present with cardiomegaly and small effusions.  Median sternotomy sutures are noted.  IMPRESSION: Improved aeration.  Persistent mild pulmonary vascular congestion with small effusions.  Original Report Authenticated By: Juline Patch, M.D.   Discharge Medications:  Medication List  As of 10/24/2011  9:09 AM   STOP taking these medications         clopidogrel 75 MG tablet      moxifloxacin 400 MG tablet         TAKE these medications         albuterol 108 (90 BASE) MCG/ACT inhaler   Commonly known as: PROVENTIL HFA;VENTOLIN HFA   Inhale 1-2 puffs into the lungs every 6 (six) hours as needed for wheezing.      ALPRAZolam 0.25 MG tablet   Commonly known as: XANAX   Take 1 tablet (0.25 mg total) by mouth 3 (three) times daily  as needed for anxiety.      aspirin 325 MG EC tablet   Take 1 tablet (325 mg total) by mouth daily.      carvedilol 3.125 MG tablet   Commonly known as: COREG   Take 1 tablet (3.125 mg total) by mouth 2 (two) times daily with a meal.      cyclobenzaprine 10 MG tablet   Commonly known as: FLEXERIL   Take 10 mg by mouth 3 (three) times daily as needed.      furosemide 40 MG  tablet   Commonly known as: LASIX   Take 1 tablet (40 mg total) by mouth daily. For one week then stop.      guaiFENesin 600 MG 12 hr tablet   Commonly known as: MUCINEX   Take 1 tablet (600 mg total) by mouth 2 (two) times daily as needed for congestion.      HYDROcodone-acetaminophen 10-325 MG per tablet   Commonly known as: NORCO   Take 1 tablet by mouth every 8 (eight) hours as needed. Pain      nicotine 14 mg/24hr patch   Commonly known as: NICODERM CQ - dosed in mg/24 hours   Place 1 patch onto the skin daily.      potassium chloride SA 20 MEQ tablet   Commonly known as: K-DUR,KLOR-CON   Take 1 tablet (20 mEq total) by mouth daily. For one week then stop.          Please note he was not discharged on an ACE or ARB as his blood pressure would only tolerate Coreg at this time.  Follow Up Appointments: Follow-up Information    Follow up with Arvilla Meres, MD. (Call for follow up appointment 1 week)    Contact information:   [address] [place] [address] [phone]       Follow up with Alleen Borne, MD. (PA/LAT CXR to be taken on 5/  /2013 at;Appointment with Dr. Laneta Simmers is on 5/  /2013 at)    Contact information:   [address] [place] [address] [phone]          Signed: Doree Fudge MPA-C Mar 20, 202013, 8:49 AM

## 2011-10-24 NOTE — Progress Notes (Signed)
   CARE MANAGEMENT NOTE 10/24/2011  Patient:  DARRON, STUCK   Account Number:  [id number]  Date Initiated:  10/23/2011  Documentation initiated by:  [facility]  Subjective/Objective Assessment:   CABG x6 and MVR on 10-24-11  Has Spouse.     Action/Plan:   PTA, PT INDEPENDENT, LIVES WITH SPOUSE, WHO WORKS.  HE PLANS TO STAY WITH HIS SISTER AT DC WHILE SPOUSE IS WORKING.  WILL FOLLOW FOR HOME NEEDS AS PT PROGRESSES.   Anticipated DC Date:  10/27/2011   Anticipated DC Plan:  HOME W HOME HEALTH SERVICES      DC Planning Services  CM consult      Choice offered to / List presented to:             Status of service:  In process, will continue to follow Medicare Important Message given?   (If response is "NO", the following Medicare IM given date fields will be blank) Date Medicare IM given:   Date Additional Medicare IM given:    Discharge Disposition:    Per UR Regulation:  Reviewed for med. necessity/level of care/duration of stay  If discussed at Long Length of Stay Meetings, dates discussed:    Comments:    Jerrell Belfast, RN, BSN Phone #[phone]

## 2011-10-24 NOTE — Progress Notes (Signed)
CARDIAC REHAB PHASE I   PRE:  Rate/Rhythm: 100 ST    BP: sitting 112/65    SaO2: 95 RA  MODE:  Ambulation: 350 ft   POST:  Rate/Rhythm: 106 ST    BP: sitting 86/63     SaO2: 94 RA  Steady walk without O2. Some SOB, SaO2 good. Pt mostly c/o abdomen around CT sutures being painful. Cut walk short due to this. Look stable. BP down after walk, denied dizziness. [id number]  Harriet Masson CES, ACSM

## 2011-10-24 NOTE — Progress Notes (Signed)
Pt ambulated 300 ft with standby assist.  Several standing rest breaks, complaints of SOB.  Coached pursed lip breathing.  Returned to recliner with call bell in reach and family present.  Will con't plan of care.

## 2011-10-25 MED ORDER — FUROSEMIDE 40 MG PO TABS: 40.0000 mg | ORAL_TABLET | Freq: Every day | ORAL | Status: DC

## 2011-10-25 MED ORDER — FUROSEMIDE 10 MG/ML IJ SOLN
40.0000 mg | Freq: Once | INTRAMUSCULAR | Status: AC
  Administered 2011-10-25: 40 mg via INTRAVENOUS
  Filled 2011-10-25: qty 4

## 2011-10-25 NOTE — Progress Notes (Signed)
CT sutures removed per order.  Sites painted.  Steri-strips applied.  Pt tolerated well.  Will continue to monitor.

## 2011-10-25 NOTE — Progress Notes (Signed)
5 Days Post-Op Procedure(s) (LRB): CORONARY ARTERY BYPASS GRAFTING (CABG) (N/A) MITRAL VALVE REPAIR (MVR) (N/A)  Subjective: Patient without complaints this.  Objective: Vital signs in last 24 hours: Patient Vitals for the past 24 hrs:  BP Temp Temp src Pulse Resp SpO2 Weight  10/25/11 0519 112/70 mmHg 97.4 F (36.3 C) Oral 91  18  93 % 182 lb 15.7 oz (83 kg)  10/24/11 1230 113/91 mmHg - - 100  - - -  10/24/11 1200 103/65 mmHg - - 95  - - -  10/24/11 1145 101/66 mmHg - - 98  - - -  10/24/11 1130 103/66 mmHg - - 96  - - -   Pre op weight  77.6 kg Current Weight  10/25/11 182 lb 15.7 oz (83 kg)      Intake/Output from previous day: 04/30 0701 - 05/01 0700 In: 120 [P.O.:120] Out: -    Physical Exam:  Cardiovascular: RRR;no murmurs, gallops, or rubs. Pulmonary: Slightly diminished at bases bilaterally; no rales, wheezes, or rhonchi. Abdomen: Soft, non tender, bowel sounds present. Extremities: Bilateral lower extremity edema, but is decreasing. Wounds: Clean and dry.  No erythema or signs of infection. Neurologic: Grossly intact without focal deficits.  Lab Results: CBC:  Basename 10/23/11 0509  WBC 11.5*  HGB 8.1*  HCT 24.4*  PLT 133*   BMET:   Basename 10/23/11 0509  NA 130*  K 4.3  CL 96  CO2 26  GLUCOSE 104*  BUN 15  CREATININE 0.95  CALCIUM 8.3*    PT/INR: No results found for this basename: LABPROT,INR in the last 72 hours ABG:  INR: Will add last result for INR, ABG once components are confirmed Will add last 4 CBG results once components are confirmed  Assessment/Plan:  1. CV - Had 3 beats of questionable NSVT around 3:47 am.SR since then. On Coreg 3.125 bid. 2.  Pulmonary - Encourage incentive spirometer. 3. Volume Overload - Diurese as bp allows. 4.  Acute blood loss anemia - Last  H/H  8.1/24.4.Previously received transfusion. 5.Mild thrombocytopenia-platelets 133,000. 6.Remove CT sutures. 7.Probable discharge later  today.    Samuele Storey MPA-C 10/25/2011 7:26 AM

## 2011-10-25 NOTE — Progress Notes (Signed)
Nutrition Follow-up  Patient states his appetite is fair. PO intake variable at 25-100% per flowsheet records. States Ensure is sweet, but he would like to continue with it at this time. Noted probable discharge later today.  Diet Order:  Heart Healthy  Meds: Scheduled Meds:   . acetaminophen  1,000 mg Oral Q6H   Or  . acetaminophen (TYLENOL) oral liquid 160 mg/5 mL  975 mg Per Tube Q6H  . aspirin EC  325 mg Oral Daily   Or  . aspirin  324 mg Per Tube Daily  . bisacodyl  10 mg Oral Daily   Or  . bisacodyl  10 mg Rectal Daily  . carvedilol  3.125 mg Oral BID WC  . docusate sodium  200 mg Oral Daily  . enoxaparin  40 mg Subcutaneous Q24H  . feeding supplement  237 mL Oral Q1500  . furosemide  40 mg Intravenous Once  . furosemide  40 mg Oral Daily  . pantoprazole  40 mg Oral Q1200  . potassium chloride  20 mEq Oral Daily  . sodium chloride  3 mL Intravenous Q12H  . DISCONTD: furosemide  40 mg Oral Daily   Continuous Infusions:  PRN Meds:.sodium chloride, ALPRAZolam, cyclobenzaprine, guaiFENesin-dextromethorphan, magnesium hydroxide, ondansetron (ZOFRAN) IV, sodium chloride, traMADol  Labs:  CMP     Component Value Date/Time   NA 130* 10/23/2011 0509   K 4.3 10/23/2011 0509   CL 96 10/23/2011 0509   CO2 26 10/23/2011 0509   GLUCOSE 104* 10/23/2011 0509   BUN 15 10/23/2011 0509   CREATININE 0.95 10/23/2011 0509   CALCIUM 8.3* 10/23/2011 0509   PROT 7.4 10/10/2011 0500   ALBUMIN 3.5 10/10/2011 0500   AST 47* 10/10/2011 0500   ALT 52 10/10/2011 0500   ALKPHOS 102 10/10/2011 0500   BILITOT 0.8 10/10/2011 0500   GFRNONAA >90 10/23/2011 0509   GFRAA >90 10/23/2011 0509     Intake/Output Summary (Last 24 hours) at 10/25/11 1156 Last data filed at 10/25/11 0800  Gross per 24 hour  Intake    240 ml  Output      0 ml  Net    240 ml    Weight Status:  83 kg (5/1) -- fluctuating likely due to fluid  Re-estimated needs:  1900-2000 kcals, 95-105 gm protein  Nutrition Dx:  Inadequate  oral intake, ongoing  Goal:  Oral intake to meet >90% of estimated nutrition needs, unmet Monitor: PO & supplemental intake, weight, labs, I/O's  Intervention:    Continue Ensure Complete daily  RD to follow for nutrition care plan  Alger Memos Pager #:  [phone]

## 2011-10-25 NOTE — Progress Notes (Signed)
CARDIAC REHAB PHASE I   PRE:  Rate/Rhythm: 92SR  BP:  Supine:   Sitting: 102/71  Standing:    SaO2: 97%RA  MODE:  Ambulation: 550 ft   POST:  Rate/Rhythem: 104ST  BP:  Supine:   Sitting: 117/60  Standing:    SaO2: 94%RA 1020-1100 Pt walked 550 ft with handheld asst with steady gait. Tolerated well. To recliner after walk. Call bell in reach. Education completed with pt and sister. Permission given to refer to [place] [facility].  Charles Savage

## 2011-10-30 ENCOUNTER — Encounter: Payer: Self-pay | Admitting: Surgery

## 2011-11-03 ENCOUNTER — Ambulatory Visit (INDEPENDENT_AMBULATORY_CARE_PROVIDER_SITE_OTHER): Payer: Medicaid Other | Admitting: Adult Health

## 2011-11-03 ENCOUNTER — Encounter: Payer: Self-pay | Admitting: Adult Health

## 2011-11-03 VITALS — BP 106/62 | HR 100 | Resp 18 | Ht 68.0 in | Wt 169.0 lb

## 2011-11-03 DIAGNOSIS — I2510 Atherosclerotic heart disease of native coronary artery without angina pectoris: Secondary | ICD-10-CM

## 2011-11-03 DIAGNOSIS — D649 Anemia, unspecified: Secondary | ICD-10-CM

## 2011-11-03 DIAGNOSIS — I509 Heart failure, unspecified: Secondary | ICD-10-CM

## 2011-11-03 DIAGNOSIS — N179 Acute kidney failure, unspecified: Secondary | ICD-10-CM

## 2011-11-03 DIAGNOSIS — I5022 Chronic systolic (congestive) heart failure: Secondary | ICD-10-CM

## 2011-11-03 DIAGNOSIS — I214 Non-ST elevation (NSTEMI) myocardial infarction: Secondary | ICD-10-CM

## 2011-11-03 DIAGNOSIS — I5020 Unspecified systolic (congestive) heart failure: Secondary | ICD-10-CM

## 2011-11-03 MED ORDER — ALPRAZOLAM 0.25 MG PO TABS: 0.2500 mg | ORAL_TABLET | Freq: Three times a day (TID) | ORAL | Status: AC | PRN

## 2011-11-03 MED ORDER — HYDROCODONE-ACETAMINOPHEN 10-325 MG PO TABS: 1.0000 | ORAL_TABLET | Freq: Three times a day (TID) | ORAL | Status: AC | PRN

## 2011-11-03 NOTE — Patient Instructions (Signed)
**Note De-Identified Charles Savage Obfuscation** You have been referred to  Cardiac rehab  Your physician has recommended you make the following change in your medication: you may take Robitussin DM cough syrup  Your physician recommends that you return for lab work in: today  Your physician recommends that you schedule a follow-up appointment in: 1 month

## 2011-11-04 ENCOUNTER — Encounter: Payer: Self-pay | Admitting: Adult Health

## 2011-11-04 LAB — CBC WITH DIFFERENTIAL/PLATELET
Eosinophils Relative: 5 % (ref 0–5)
HCT: 34.6 % — ABNORMAL LOW (ref 39.0–52.0)
Hemoglobin: 10.8 g/dL — ABNORMAL LOW (ref 13.0–17.0)
Lymphocytes Relative: 15 % (ref 12–46)
Lymphs Abs: 2 10*3/uL (ref 0.7–4.0)
MCH: 29.8 pg (ref 26.0–34.0)
MCV: 95.3 fL (ref 78.0–100.0)
Monocytes Relative: 10 % (ref 3–12)
Platelets: 590 10*3/uL — ABNORMAL HIGH (ref 150–400)
RBC: 3.63 MIL/uL — ABNORMAL LOW (ref 4.22–5.81)
WBC: 13.4 10*3/uL — ABNORMAL HIGH (ref 4.0–10.5)

## 2011-11-04 LAB — BASIC METABOLIC PANEL
CO2: 27 mEq/L (ref 19–32)
Chloride: 99 mEq/L (ref 96–112)
Creat: 0.79 mg/dL (ref 0.50–1.35)
Glucose, Bld: 100 mg/dL — ABNORMAL HIGH (ref 70–99)

## 2011-11-04 NOTE — Assessment & Plan Note (Signed)
Will repeat his CXR and BNP for evaluation of status.

## 2011-11-04 NOTE — Assessment & Plan Note (Signed)
He is still very deconditioned from lengthy hospitalization and CABG. He is slowly regaining is strength. He is congested in the right lung. Will repeat CXR to evaluate for pleural effusion or pneumonia, check a CBC and BMET to evaluate for anemia and renal status. He is given refill on pain medication for one week until he sees Dr. Lavinia Sharps for continued post-operative pain. He will be followed closely for the next few weeks for ongoing management of CAD and CHF. He will have repeat echo in 3 months.

## 2011-11-04 NOTE — Progress Notes (Signed)
HPI: Mr. Roots is a 56 y/o patient of Dr. Dietrich Pates, [place] and [place] we are seeing on hospital follow up after lengthy and complicated hospitalization for CHF, MV disease, ARF, with history of CVA s/p left carotid endarterectomy, COPD and remote history of ETOH abuse, with recent smoking cessation. He was actually seen first in [facility] for CHF and ARF with diureses and stabilization and then transferred for further management to [facility]. Echo showed EF of 40-45% with global hypokinesis. He was found to have moderate to severe MR. He was seen by Dr. Teressa Lower and started on milrinone drip and diuresed further. Cardiac cath was then completed with high grade LM lesion and severe 3 vessel CAD..   Dr. Lavinia Sharps performed  CABG X 6 ( LIMA-LAD, SCG to OM 1-OM2-OM3,SVG to LPD and SVG to RCA); and mitral valve repair on 10/20/2011 using a 28 mm Sorin Memo 3 D annuloplasty ring.  He had some post-operative blood loss anemia and thrombocytopenia and was started on Ferrous Sulfate. Kidney fx was restored and he was discharged after a total of 2 weeks of hospitalization.    He comes today tired, but feeling and breathing better. He still has occasional coughing and chest soreness with this. He denies fever, chills or significant dyspnea, He is slowly beginning to get his strength back, but his appetite is sill very low.   Allergies  Allergen Reactions  . Metoprolol Shortness Of Breath and Nausea And Vomiting  . Morphine And Related Hives and Itching  . Other Other (See Comments)    Peaches cause hives and itching   . Peach Flavor Hives and Itching    Current Outpatient Prescriptions  Medication Sig Dispense Refill  . albuterol (PROVENTIL HFA;VENTOLIN HFA) 108 (90 BASE) MCG/ACT inhaler Inhale 1-2 puffs into the lungs every 6 (six) hours as needed for wheezing.  1 Inhaler  0  . aspirin 325 MG tablet Take 325 mg by mouth daily.      . carvedilol (COREG) 3.125 MG tablet Take 1 tablet (3.125 mg total) by mouth 2  (two) times daily with a meal.  60 tablet  0  . cyclobenzaprine (FLEXERIL) 10 MG tablet Take 10 mg by mouth 3 (three) times daily as needed.        . docusate sodium (COLACE) 100 MG capsule Take 100 mg by mouth daily.      Marland Kitchen HYDROcodone-acetaminophen (NORCO) 10-325 MG per tablet Take 1 tablet by mouth every 8 (eight) hours as needed. Pain  21 tablet  0  . ALPRAZolam (XANAX) 0.25 MG tablet Take 1 tablet (0.25 mg total) by mouth 3 (three) times daily as needed for sleep.  21 tablet  0    Past Medical History  Diagnosis Date  . Chronic pain     back pain  . Stroke   . Hyperlipemia   . Lumbar disc disease   . Carotid artery disease   . Myocardial infarct   . Pneumonia   . CHF (congestive heart failure)   . COPD (chronic obstructive pulmonary disease)     on 2L O2 at home since 08/2011  . Coronary artery disease   . Shortness of breath     Past Surgical History  Procedure Date  . Back surgery 1610,[id number]  . Knee arthroscopy 2002    rt  . Foreign body removal 06/05/2011    Procedure: FOREIGN BODY REMOVAL ADULT;  Surgeon: Rosalio Macadamia;  Location: [facility];  Service: Plastics;  Laterality: Right;  removal foreign body from right side face   . Carotid endarterectomy   . Tee without cardioversion 10/17/2011    Procedure: TRANSESOPHAGEAL ECHOCARDIOGRAM (TEE);  Surgeon: Dolores Patty, MD;  Location: [facility];  Service: Cardiovascular;  Laterality: N/A;  . Coronary artery bypass graft 10/20/2011    Procedure: CORONARY ARTERY BYPASS GRAFTING (CABG);  Surgeon: Alleen Borne, MD;  Location: [facility] OR;  Service: Open Heart Surgery;  Laterality: N/A;  CABG x six;  using left internal mammary artery and right leg greater saphenous vein harvested endoscopically  . Mitral valve repair 10/20/2011    Procedure: MITRAL VALVE REPAIR (MVR);  Surgeon: Alleen Borne, MD;  Location: [facility] OR;  Service: Open Heart Surgery;  Laterality: N/A;    ZOX:WRUEAV of systems complete and found  to be negative unless listed above  PHYSICAL EXAM BP 106/62  Pulse 100  Resp 18  Ht 5\' 8"  (1.727 m)  Wt 169 lb (76.658 kg)  BMI 25.70 kg/m2  General: Well developed, well nourished, deconditioned in no acute distress Head: Eyes PERRLA, No xanthomas.   Normal cephalic and atramatic  Lungs: Crackles noted on the right base. No wheezes or rhonchi. Heart: HRRR S1 S2, distant without MRG.  Pulses are 2+ & equal.            No carotid bruit. No JVD.  No abdominal bruits. No femoral bruits. Abdomen: Bowel sounds are positive, abdomen soft and non-tender without masses or                  Hernia's noted. Msk:  Back normal, normal gait. Normal strength and tone for age. Sternotomy incision is healing well without signs of infection.  Extremities: Positive for clubbing, cyanosis or edema.  DP +1 Neuro: Alert and oriented X 3. Psych:  Good affect, responds appropriately    ASSESSMENT AND PLAN

## 2011-11-04 NOTE — Assessment & Plan Note (Signed)
Repeating BMET for ongoing assessment.

## 2011-11-07 ENCOUNTER — Ambulatory Visit (HOSPITAL_COMMUNITY)
Admission: RE | Admit: 2011-11-07 | Discharge: 2011-11-07 | Disposition: A | Discharge status: Home / Self Care | Payer: Medicaid Other | Source: Ambulatory Visit | Attending: Adult Health | Admitting: Adult Health

## 2011-11-07 ENCOUNTER — Other Ambulatory Visit: Payer: Self-pay

## 2011-11-07 DIAGNOSIS — J9819 Other pulmonary collapse: Secondary | ICD-10-CM | POA: Insufficient documentation

## 2011-11-07 DIAGNOSIS — R0602 Shortness of breath: Secondary | ICD-10-CM

## 2011-11-07 DIAGNOSIS — J90 Pleural effusion, not elsewhere classified: Secondary | ICD-10-CM | POA: Insufficient documentation

## 2011-11-07 NOTE — Progress Notes (Signed)
**Note De-Identified Lafaye Mcelmurry Obfuscation** Pt's wife advised of lab results and recommendation by Joni Reining, NP to have CXR and BNP to r/o pneumonia, she verbalized understanding./LV

## 2011-11-08 LAB — BRAIN NATRIURETIC PEPTIDE: Brain Natriuretic Peptide: 532.1 pg/mL — ABNORMAL HIGH (ref 0.0–100.0)

## 2011-11-09 ENCOUNTER — Other Ambulatory Visit: Payer: Self-pay | Admitting: Surgery

## 2011-11-09 ENCOUNTER — Telehealth: Payer: Self-pay | Admitting: Cardiology

## 2011-11-09 DIAGNOSIS — I059 Rheumatic mitral valve disease, unspecified: Secondary | ICD-10-CM

## 2011-11-09 NOTE — Telephone Encounter (Signed)
Larita Fife has been attempting to contact patient and has not had a response.  Will forward for follow up.

## 2011-11-09 NOTE — Telephone Encounter (Signed)
WIFE CALLING FOR TEST RESULTS/  PLUS [facility] NEEDS [place] TO CALL THEM TO COME PICK UP OXYGEN SINCE KATHRYN DECREASED IT.

## 2011-11-10 ENCOUNTER — Telehealth: Payer: Self-pay | Admitting: *Deleted

## 2011-11-10 NOTE — Telephone Encounter (Signed)
[facility] to discontinue Home Oxygen service as of today, due to patient needs.

## 2011-11-10 NOTE — Telephone Encounter (Signed)
**Note De-Identified Birgitta Uhlir Obfuscation** Pt's wife wants CXR and lab results ordered at last OV./LV

## 2011-11-10 NOTE — Telephone Encounter (Addendum)
Call received from wife, Pam requesting results of labs and Chest X ray.  Reviewed results with Joni Reining, NP, who advised that Chest X ray results were within limits of a post CABG patient in the absence of SOB, which the wife states is not an issue for him.  This information and further lab results discussed with her.  Advised her to keep follow up with Dr Cornelius Moras and he would be followed closely.  Patient is not needing oxygen, therefore I will contact [facility] to pick up, with verbal agreeance from [facility].

## 2011-11-14 ENCOUNTER — Ambulatory Visit
Admission: RE | Admit: 2011-11-14 | Discharge: 2011-11-14 | Disposition: A | Discharge status: Home / Self Care | Payer: Medicaid Other | Source: Ambulatory Visit | Attending: Surgery | Admitting: Surgery

## 2011-11-14 ENCOUNTER — Ambulatory Visit (INDEPENDENT_AMBULATORY_CARE_PROVIDER_SITE_OTHER): Payer: Self-pay | Admitting: Surgery

## 2011-11-14 ENCOUNTER — Encounter: Payer: Self-pay | Admitting: Surgery

## 2011-11-14 VITALS — BP 93/62 | HR 101 | Resp 16 | Ht 69.0 in | Wt 170.0 lb

## 2011-11-14 DIAGNOSIS — I2510 Atherosclerotic heart disease of native coronary artery without angina pectoris: Secondary | ICD-10-CM

## 2011-11-14 DIAGNOSIS — I059 Rheumatic mitral valve disease, unspecified: Secondary | ICD-10-CM

## 2011-11-14 DIAGNOSIS — Z09 Encounter for follow-up examination after completed treatment for conditions other than malignant neoplasm: Secondary | ICD-10-CM

## 2011-11-14 NOTE — Progress Notes (Signed)
                   [address]            [place] [address]          [phone]      HPI: Patient returns for routine postoperative follow-up having undergone coronary artery bypass grafting x 6 and mitral valve annuloplasty on 10/20/11. The patient's early postoperative recovery while in the hospital was notable for an uncomplicated postop course. Since hospital discharge the patient reports he has been progressing well.   Current Outpatient Prescriptions  Medication Sig Dispense Refill  . albuterol (PROVENTIL HFA;VENTOLIN HFA) 108 (90 BASE) MCG/ACT inhaler Inhale 1-2 puffs into the lungs every 6 (six) hours as needed for wheezing.  1 Inhaler  0  . ALPRAZolam (XANAX) 0.25 MG tablet Take 1 tablet (0.25 mg total) by mouth 3 (three) times daily as needed for sleep.  21 tablet  0  . aspirin 325 MG tablet Take 325 mg by mouth daily.      . carvedilol (COREG) 3.125 MG tablet Take 1 tablet (3.125 mg total) by mouth 2 (two) times daily with a meal.  60 tablet  0  . cyclobenzaprine (FLEXERIL) 10 MG tablet Take 10 mg by mouth 3 (three) times daily as needed.        . docusate sodium (COLACE) 100 MG capsule Take 100 mg by mouth daily.      Marland Kitchen HYDROcodone-acetaminophen (NORCO) 10-325 MG per tablet Take 1 tablet by mouth every 8 (eight) hours as needed. Pain  21 tablet  0    Physical Exam:  BP 93/62  Pulse 101  Resp 16  Ht 5\' 9"  (1.753 m)  Wt 170 lb (77.111 kg)  BMI 25.10 kg/m2  SpO2 97% He looks well Lung exam is clear. Cardiac exam:  RRR. No murmur Chest incision is healing well and the sternum is stable. The leg incision is healing well and there is no edema.  Diagnostic Tests:  *RADIOLOGY REPORT*   Clinical Data: Follow-up heart surgery.   CHEST - 2 VIEW   Comparison: 11/07/2011.   Findings: Stable surgical changes from triple bypass surgery and valve replacement surgery.  The heart is normal in size and stable. The mediastinal and hilar contours are  stable.  There is a small residual right-sided pleural effusion or pleural thickening.  No edema, infiltrates or pneumothorax.   IMPRESSION:   1.  Minimal residual right-sided pleural fluid or pleural thickening. 2.  No infiltrates or edema.   Original Report Authenticated By: P. Loralie Champagne, M.D   Impression:  Overall, he is progressing well. I encouraged him to continue walking as much as possible. He is planning to participate in cardiac rehab. I told him he could drive but should refrain from lifting anything heavier than 10 lbs for three months postop.  Plan:  He will continue to follow up with Dr. Dietrich Pates and Dr. Phillips Odor and will contact me if he develops any problems with his incisions.

## 2011-12-08 ENCOUNTER — Encounter: Payer: Self-pay | Admitting: Cardiology

## 2011-12-08 ENCOUNTER — Ambulatory Visit (INDEPENDENT_AMBULATORY_CARE_PROVIDER_SITE_OTHER): Payer: Medicaid Other | Admitting: Cardiology

## 2011-12-08 VITALS — BP 109/71 | HR 93 | Resp 16 | Ht 68.0 in | Wt 168.0 lb

## 2011-12-08 DIAGNOSIS — Z87891 Personal history of nicotine dependence: Secondary | ICD-10-CM

## 2011-12-08 DIAGNOSIS — I2510 Atherosclerotic heart disease of native coronary artery without angina pectoris: Secondary | ICD-10-CM

## 2011-12-08 DIAGNOSIS — F17201 Nicotine dependence, unspecified, in remission: Secondary | ICD-10-CM

## 2011-12-08 DIAGNOSIS — I7090 Unspecified atherosclerosis: Secondary | ICD-10-CM

## 2011-12-08 DIAGNOSIS — I679 Cerebrovascular disease, unspecified: Secondary | ICD-10-CM

## 2011-12-08 DIAGNOSIS — E785 Hyperlipidemia, unspecified: Secondary | ICD-10-CM

## 2011-12-08 DIAGNOSIS — I059 Rheumatic mitral valve disease, unspecified: Secondary | ICD-10-CM

## 2011-12-08 DIAGNOSIS — E782 Mixed hyperlipidemia: Secondary | ICD-10-CM

## 2011-12-08 NOTE — Progress Notes (Signed)
Patient ID: Charles Savage, male   DOB: Dec 15, 1955, 56 y.o.   MRN: [id number]  HPI: Scheduled return visit for this very nice gentleman who is now 2 months status post combined CABG and mitral valve repair.  He did extremely well following surgery, recovering adequately to permit discharge before postoperative day 7.  He has continued to do well at home without significant incisional discomfort, dyspnea or other medical problems.  He has gradually increased activity.  He sustained a 50 pound weight loss perioperatively, and has not regained any of that.  All neurologic symptoms have resolved from the CVA he suffered in February of this year.  Prior to Admission medications   Medication Sig Start Date End Date Taking? Authorizing Provider  albuterol (PROVENTIL HFA;VENTOLIN HFA) 108 (90 BASE) MCG/ACT inhaler Inhale 1-2 puffs into the lungs every 6 (six) hours as needed for wheezing. 09/27/11 09/26/12 Yes Shelda Jakes, MD  ALPRAZolam Prudy Feeler) 0.25 MG tablet Take 0.25 mg by mouth at bedtime as needed.   Yes Historical Provider, MD  aspirin 325 MG tablet Take 325 mg by mouth daily.   Yes Historical Provider, MD  carvedilol (COREG) 3.125 MG tablet Take 1 tablet (3.125 mg total) by mouth 2 (two) times daily with a meal. 10/02/11 10/01/12 Yes Nimish Normajean Glasgow, MD  cyclobenzaprine (FLEXERIL) 10 MG tablet Take 10 mg by mouth 3 (three) times daily as needed.     Yes Historical Provider, MD  docusate sodium (COLACE) 100 MG capsule Take 100 mg by mouth daily.   Yes Historical Provider, MD  HYDROcodone-acetaminophen (NORCO) 10-325 MG per tablet Take 1 tablet by mouth every 8 (eight) hours as needed. Pain 11/03/11  Yes Jodelle Gross, NP   Allergies  Allergen Reactions  . Metoprolol Shortness Of Breath and Nausea And Vomiting  . Morphine And Related Hives and Itching  . Other Other (See Comments)    Peaches cause hives and itching   . Peach Flavor Hives and Itching     Past medical history, social history, and  family history reviewed and updated.  ROS: Denies orthopnea, PND, palpitations, lightheadedness, syncope or pedal edema.  Sternum is stable without pain.  All other systems reviewed and are negative.  PHYSICAL EXAM: BP 109/71  Pulse 93  Resp 16  Ht 5\' 8"  (1.727 m)  Wt 76.204 kg (168 lb)  BMI 25.54 kg/m2  General-Well developed; no acute distress Body habitus-proportionate weight and height Neck-No JVD; no carotid bruits; left carotid endarterectomy scar that is well-healed Lungs-clear lung fields; resonant to percussion; median sternotomy incision that is well-healed with stable sternum Cardiovascular-normal PMI; normal S1 and S2; modest basilar systolic ejection murmur Abdomen-normal bowel sounds; soft and non-tender without masses or organomegaly Musculoskeletal-No deformities, no cyanosis or clubbing Neurologic-Normal cranial nerves; symmetric strength and tone Skin-Warm, no significant lesions Extremities-distal pulses intact; no edema  ASSESSMENT AND PLAN:  [place] Bing, MD 12/08/2011 8:14 PM

## 2011-12-08 NOTE — Progress Notes (Deleted)
Name: Charles Savage    DOB: 1955/11/18  Age: 56 y.o.  MR#: [id number]       PCP:  Colette Ribas, MD      Insurance: @PAYORNAME @   CC:    Chief Complaint  Patient presents with  . Appointment    no complaints -meds/list    VS BP 109/71  Pulse 93  Resp 16  Ht 5\' 8"  (1.727 m)  Wt 168 lb (76.204 kg)  BMI 25.54 kg/m2  Weights Current Weight  12/08/11 168 lb (76.204 kg)  11/14/11 170 lb (77.111 kg)  11/03/11 169 lb (76.658 kg)    Blood Pressure  BP Readings from Last 3 Encounters:  12/08/11 109/71  11/14/11 93/62  11/03/11 106/62     Admit date:  (Not on file) Last encounter with RMR:  11/09/2011   Allergy Allergies  Allergen Reactions  . Metoprolol Shortness Of Breath and Nausea And Vomiting  . Morphine And Related Hives and Itching  . Other Other (See Comments)    Peaches cause hives and itching   . Peach Flavor Hives and Itching    Current Outpatient Prescriptions  Medication Sig Dispense Refill  . albuterol (PROVENTIL HFA;VENTOLIN HFA) 108 (90 BASE) MCG/ACT inhaler Inhale 1-2 puffs into the lungs every 6 (six) hours as needed for wheezing.  1 Inhaler  0  . ALPRAZolam (XANAX) 0.25 MG tablet Take 0.25 mg by mouth at bedtime as needed.      Marland Kitchen aspirin 325 MG tablet Take 325 mg by mouth daily.      . carvedilol (COREG) 3.125 MG tablet Take 1 tablet (3.125 mg total) by mouth 2 (two) times daily with a meal.  60 tablet  0  . cyclobenzaprine (FLEXERIL) 10 MG tablet Take 10 mg by mouth 3 (three) times daily as needed.        . docusate sodium (COLACE) 100 MG capsule Take 100 mg by mouth daily.      Marland Kitchen HYDROcodone-acetaminophen (NORCO) 10-325 MG per tablet Take 1 tablet by mouth every 8 (eight) hours as needed. Pain  21 tablet  0    Discontinued Meds:   There are no discontinued medications.  Patient Active Problem List  Diagnosis  . NSTEMI (non-ST elevated myocardial infarction)  . History of CVA (cerebrovascular accident) without residual deficits  .  Hyperlipemia  . Lumbar disc disease  . Carotid artery disease  . Acute respiratory failure with hypoxia  . Abnormal EKG  . Tobacco abuse  . 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 Acute renal failure secondary to ATN versus acute interstitial nephritis  . CAD (coronary artery disease)  . Interstitial lung disease  . HCAP (healthcare-associated pneumonia)  . COPD (chronic obstructive pulmonary disease)  . Elevated LFTs  . Hyperglycemia    LABS Orders Only on 11/07/2011  Component Date Value  . Brain Natriuretic Peptide 11/07/2011 532.1*  Office Visit on 11/03/2011  Component Date Value  . WBC 11/03/2011 13.4*  . RBC 11/03/2011 3.63*  . Hemoglobin 11/03/2011 10.8*  . HCT 11/03/2011 34.6*  . MCV 11/03/2011 95.3   . MCH 11/03/2011 29.8   . MCHC 11/03/2011 31.2   . RDW 11/03/2011 16.6*  . Platelets 11/03/2011 590*  . Neutrophils Relative 11/03/2011 69   . Neutro Abs 11/03/2011 9.3*  . Lymphocytes Relative 11/03/2011 15   . Lymphs Abs 11/03/2011 2.0   . Monocytes Relative 11/03/2011 10   . Monocytes Absolute 11/03/2011 1.4*  . Eosinophils  Relative 11/03/2011 5   . Eosinophils Absolute 11/03/2011 0.6   . Basophils Relative 11/03/2011 1   . Basophils Absolute 11/03/2011 0.1   . Smear Review 11/03/2011 Criteria for review not met   . Sodium 11/03/2011 137   . Potassium 11/03/2011 5.6*  . Chloride 11/03/2011 99   . CO2 11/03/2011 27   . Glucose, Bld 11/03/2011 100*  . BUN 11/03/2011 15   . Creat 11/03/2011 0.79   . Calcium 11/03/2011 9.5   Admission on 10/09/2011, Discharged on 10/25/2011  No results displayed because visit has over 200 results.    Admission on 09/28/2011, Discharged on 10/02/2011  Component Date Value  . WBC 09/29/2011 16.5*  . RBC 09/29/2011 4.28   . Hemoglobin 09/29/2011 13.3   . HCT 09/29/2011 41.0   . MCV 09/29/2011 95.8   . [facility] 09/29/2011 31.1   . MCHC 09/29/2011 32.4   . RDW 09/29/2011 15.3     . Platelets 09/29/2011 325   . Neutrophils Relative 09/29/2011 73   . Neutro Abs 09/29/2011 12.1*  . Lymphocytes Relative 09/29/2011 19   . Lymphs Abs 09/29/2011 3.1   . Monocytes Relative 09/29/2011 8   . Monocytes Absolute 09/29/2011 1.3*  . Eosinophils Relative 09/29/2011 0   . Eosinophils Absolute 09/29/2011 0.0   . Basophils Relative 09/29/2011 0   . Basophils Absolute 09/29/2011 0.0   . Sodium 09/29/2011 135   . Potassium 09/29/2011 4.9   . Chloride 09/29/2011 95*  . CO2 09/29/2011 23   . Glucose, Bld 09/29/2011 164*  . BUN 09/29/2011 46*  . Creatinine, Ser 09/29/2011 2.23*  . Calcium 09/29/2011 9.5   . Total Protein 09/29/2011 7.9   . Albumin 09/29/2011 3.8   . AST 09/29/2011 50*  . ALT 09/29/2011 55*  . Alkaline Phosphatase 09/29/2011 82   . Total Bilirubin 09/29/2011 0.7   . GFR calc non Af Amer 09/29/2011 31*  . GFR calc Af Amer 09/29/2011 36*  . Troponin I 09/29/2011 <0.30   . Troponin i, poc 09/29/2011 0.04   . Comment 3 09/29/2011          . Magnesium 09/29/2011 2.2   . TSH 09/29/2011 1.859   . Total CK 09/29/2011 65   . CK, MB 09/29/2011 4.6*  . Troponin I 09/29/2011 <0.30   . Relative Index 09/29/2011 RELATIVE INDEX IS INVALID   . Total CK 09/29/2011 68   . CK, MB 09/29/2011 5.1*  . Troponin I 09/29/2011 <0.30   . Relative Index 09/29/2011 RELATIVE INDEX IS INVALID   . Total CK 09/29/2011 54   . CK, MB 09/29/2011 4.6*  . Troponin I 09/29/2011 <0.30   . Relative Index 09/29/2011 RELATIVE INDEX IS INVALID   . Color, Urine 09/30/2011 YELLOW   . APPearance 09/30/2011 CLEAR   . Specific Gravity, Urine 09/30/2011 1.015   . pH 09/30/2011 5.5   . Glucose, UA 09/30/2011 NEGATIVE   . Hgb urine dipstick 09/30/2011 TRACE*  . Bilirubin Urine 09/30/2011 NEGATIVE   . Ketones, ur 09/30/2011 NEGATIVE   . Protein, ur 09/30/2011 NEGATIVE   . Urobilinogen, UA 09/30/2011 0.2   . Nitrite 09/30/2011 NEGATIVE   . Leukocytes, UA 09/30/2011 NEGATIVE   . WBC 09/30/2011  14.1*  . RBC 09/30/2011 4.27   . Hemoglobin 09/30/2011 13.1   . HCT 09/30/2011 40.0   . MCV 09/30/2011 93.7   . [facility] 09/30/2011 30.7   . MCHC 09/30/2011 32.8   . RDW 09/30/2011 15.2   . Platelets 09/30/2011  301   . WBC 09/29/2011 16.6*  . RBC 09/29/2011 4.11*  . Hemoglobin 09/29/2011 12.7*  . HCT 09/29/2011 38.5*  . MCV 09/29/2011 93.7   . [facility] 09/29/2011 30.9   . MCHC 09/29/2011 33.0   . RDW 09/29/2011 15.1   . Platelets 09/29/2011 308   . Creatinine, Ser 09/29/2011 2.21*  . GFR calc non Af Amer 09/29/2011 32*  . GFR calc Af Amer 09/29/2011 37*  . Sodium 09/30/2011 136   . Potassium 09/30/2011 4.0   . Chloride 09/30/2011 98   . CO2 09/30/2011 26   . Glucose, Bld 09/30/2011 105*  . BUN 09/30/2011 53*  . Creatinine, Ser 09/30/2011 2.08*  . Calcium 09/30/2011 8.9   . Total Protein 09/30/2011 6.9   . Albumin 09/30/2011 3.3*  . AST 09/30/2011 163*  . ALT 09/30/2011 200*  . Alkaline Phosphatase 09/30/2011 76   . Total Bilirubin 09/30/2011 0.8   . GFR calc non Af Amer 09/30/2011 34*  . GFR calc Af Amer 09/30/2011 39*  . Prothrombin Time 09/30/2011 18.4*  . INR 09/30/2011 1.50*  . aPTT 09/30/2011 29   . Squamous Epithelial / LPF 09/30/2011 RARE   . WBC, UA 09/30/2011 0-2   . RBC / HPF 09/30/2011 0-2   . Bacteria, UA 09/30/2011 RARE   . Crystals 09/30/2011 URIC ACID CRYSTALS*  . WBC 10/01/2011 10.2   . RBC 10/01/2011 4.37   . Hemoglobin 10/01/2011 13.5   . HCT 10/01/2011 41.0   . MCV 10/01/2011 93.8   . [facility] 10/01/2011 30.9   . MCHC 10/01/2011 32.9   . RDW 10/01/2011 15.3   . Platelets 10/01/2011 252   . Sodium 10/01/2011 137   . Potassium 10/01/2011 3.6   . Chloride 10/01/2011 100   . CO2 10/01/2011 26   . Glucose, Bld 10/01/2011 90   . BUN 10/01/2011 37*  . Creatinine, Ser 10/01/2011 1.74*  . Calcium 10/01/2011 8.7   . GFR calc non Af Amer 10/01/2011 42*  . GFR calc Af Amer 10/01/2011 49*  . Vancomycin Rm 10/02/2011 10.9   . Sodium 10/02/2011 138   . Potassium  10/02/2011 3.9   . Chloride 10/02/2011 101   . CO2 10/02/2011 28   . Glucose, Bld 10/02/2011 86   . BUN 10/02/2011 25*  . Creatinine, Ser 10/02/2011 1.55*  . Calcium 10/02/2011 8.8   . GFR calc non Af Amer 10/02/2011 48*  . GFR calc Af Amer 10/02/2011 56*  . WBC 10/02/2011 9.1   . RBC 10/02/2011 4.04*  . Hemoglobin 10/02/2011 12.3*  . HCT 10/02/2011 38.3*  . MCV 10/02/2011 94.8   . [facility] 10/02/2011 30.4   . MCHC 10/02/2011 32.1   . RDW 10/02/2011 15.5   . Platelets 10/02/2011 227   Admission on 09/26/2011, Discharged on 09/27/2011  Component Date Value  . WBC 09/26/2011 12.5*  . RBC 09/26/2011 4.04*  . Hemoglobin 09/26/2011 12.5*  . HCT 09/26/2011 38.1*  . MCV 09/26/2011 94.3   . [facility] 09/26/2011 30.9   . MCHC 09/26/2011 32.8   . RDW 09/26/2011 14.4   . Platelets 09/26/2011 340   . Neutrophils Relative 09/26/2011 65   . Neutro Abs 09/26/2011 8.2*  . Lymphocytes Relative 09/26/2011 25   . Lymphs Abs 09/26/2011 3.2   . Monocytes Relative 09/26/2011 7   . Monocytes Absolute 09/26/2011 0.9   . Eosinophils Relative 09/26/2011 1   . Eosinophils Absolute 09/26/2011 0.2   . Basophils Relative 09/26/2011 1   . Basophils Absolute  09/26/2011 0.1   . Sodium 09/26/2011 135   . Potassium 09/26/2011 4.2   . Chloride 09/26/2011 95*  . CO2 09/26/2011 26   . Glucose, Bld 09/26/2011 129*  . BUN 09/26/2011 31*  . Creatinine, Ser 09/26/2011 1.95*  . Calcium 09/26/2011 9.5   . Total Protein 09/26/2011 7.9   . Albumin 09/26/2011 3.8   . AST 09/26/2011 28   . ALT 09/26/2011 34   . Alkaline Phosphatase 09/26/2011 83   . Total Bilirubin 09/26/2011 0.7   . GFR calc non Af Amer 09/26/2011 37*  . GFR calc Af Amer 09/26/2011 43*  . Pro B Natriuretic peptid* 09/26/2011 17216.0*  . Troponin I 09/26/2011 <0.30   . O2 Content 09/27/2011 3.0   . Delivery systems 09/27/2011 NASAL CANNULA   . pH, Arterial 09/27/2011 7.439   . pCO2 arterial 09/27/2011 36.1   . pO2, Arterial 09/27/2011 65.2*  .  Bicarbonate 09/27/2011 24.1*  . TCO2 09/27/2011 21.4   . Acid-Base Excess 09/27/2011 0.4   . O2 Saturation 09/27/2011 92.2   . Patient temperature 09/27/2011 37.0   . Collection site 09/27/2011 LEFT RADIAL   . Drawn by 09/27/2011 16109   . Sample type 09/27/2011 ARTERIAL   . Allens test (pass/fail) 09/27/2011 PASS   Admission on 09/10/2011, Discharged on 09/20/2011  No results displayed because visit has over 200 results.       Results for this Opt Visit:     Results for orders placed in visit on 11/07/11  BRAIN NATRIURETIC PEPTIDE      Component Value Range   Brain Natriuretic Peptide 532.1 (*) 0.0 - 100.0 pg/mL    EKG Orders placed in visit on 11/03/11  . EKG 12-LEAD     Prior Assessment and Plan Problem List as of 12/08/2011            Cardiology Problems   Hyperlipemia   Carotid artery disease   CAD (coronary artery disease)   Last Assessment & Plan Note   11/03/2011 Office Visit Signed 11/04/2011  3:24 PM by Jodelle Gross, NP    He is still very deconditioned from lengthy hospitalization and CABG. He is slowly regaining is strength. He is congested in the right lung. Will repeat CXR to evaluate for pleural effusion or pneumonia, check a CBC and BMET to evaluate for anemia and renal status. He is given refill on pain medication for one week until he sees Dr. Lavinia Sharps for continued post-operative pain. He will be followed closely for the next few weeks for ongoing management of CAD and CHF. He will have repeat echo in 3 months.    NSTEMI (non-ST elevated myocardial infarction)   Systolic congestive heart failure with reduced left ventricular function, NYHA class 2   Last Assessment & Plan Note   11/03/2011 Office Visit Signed 11/04/2011  3:26 PM by Jodelle Gross, NP    Will repeat his CXR and BNP for evaluation of status.      Other   History of CVA (cerebrovascular accident) without residual deficits   Lumbar disc disease   Acute respiratory failure with  hypoxia   Abnormal EKG   Tobacco abuse   Acute renal failure secondary to ATN versus acute interstitial nephritis   Last Assessment & Plan Note   11/03/2011 Office Visit Signed 11/04/2011  3:26 PM by Jodelle Gross, NP    Repeating BMET for ongoing assessment.    Interstitial lung disease   HCAP (healthcare-associated pneumonia)   COPD (chronic  obstructive pulmonary disease)   Elevated LFTs   Hyperglycemia       Imaging: Dg Chest 2 View  11/14/2011  *RADIOLOGY REPORT*  Clinical Data: Follow-up heart surgery.  CHEST - 2 VIEW  Comparison: 11/07/2011.  Findings: Stable surgical changes from triple bypass surgery and valve replacement surgery.  The heart is normal in size and stable. The mediastinal and hilar contours are stable.  There is a small residual right-sided pleural effusion or pleural thickening.  No edema, infiltrates or pneumothorax.  IMPRESSION:  1.  Minimal residual right-sided pleural fluid or pleural thickening. 2.  No infiltrates or edema.  Original Report Authenticated By: P. Loralie Champagne, M.D.     [facility] Calculation: Score not calculated. Missing: Total Cholesterol

## 2011-12-08 NOTE — Patient Instructions (Addendum)
Your physician recommends that you schedule a follow-up appointment in: 4 months  Increase aerobic activity  1.5 gram sodium diet  Your physician recommends that you return for lab work in: Today

## 2011-12-09 LAB — LIPID PANEL
Cholesterol: 216 mg/dL — ABNORMAL HIGH (ref 0–200)
VLDL: 45 mg/dL — ABNORMAL HIGH (ref 0–40)

## 2011-12-10 ENCOUNTER — Encounter: Payer: Self-pay | Admitting: Cardiology

## 2011-12-10 NOTE — Assessment & Plan Note (Signed)
Patient congratulated on abstaining from tobacco use since his acute illness began 3 months ago.

## 2011-12-10 NOTE — Assessment & Plan Note (Signed)
Patient is doing extremely well following combined CABG/MV repair/annuloplasty ring after presenting with severe CHF.  Current medical regime will be maintained.  Laboratory studies will be repeated to assess for resolution of peri-operative abnormalities including acute renal insufficiency, electrolyte disturbance, abnormal LFTs and anemia.

## 2011-12-10 NOTE — Assessment & Plan Note (Signed)
Lipids have been reassessed and require pharmacologic therapy.  Simvastatin 40 mg per day will be added to Charles Savage medical regime.

## 2011-12-11 ENCOUNTER — Encounter: Payer: Self-pay | Admitting: Cardiology

## 2011-12-11 DIAGNOSIS — Z9289 Personal history of other medical treatment: Secondary | ICD-10-CM | POA: Insufficient documentation

## 2011-12-12 ENCOUNTER — Encounter: Payer: Self-pay | Admitting: *Deleted

## 2011-12-12 ENCOUNTER — Other Ambulatory Visit: Payer: Self-pay | Admitting: *Deleted

## 2011-12-12 DIAGNOSIS — E782 Mixed hyperlipidemia: Secondary | ICD-10-CM

## 2011-12-12 MED ORDER — SIMVASTATIN 40 MG PO TABS: 40.0000 mg | ORAL_TABLET | Freq: Every day | ORAL | Status: DC

## 2011-12-15 ENCOUNTER — Telehealth: Payer: Self-pay | Admitting: *Deleted

## 2011-12-15 ENCOUNTER — Ambulatory Visit (HOSPITAL_COMMUNITY)
Admission: RE | Admit: 2011-12-15 | Discharge: 2011-12-15 | Disposition: A | Discharge status: Home / Self Care | Payer: Medicaid Other | Source: Ambulatory Visit | Attending: Cardiology | Admitting: Cardiology

## 2011-12-15 DIAGNOSIS — Z8673 Personal history of transient ischemic attack (TIA), and cerebral infarction without residual deficits: Secondary | ICD-10-CM | POA: Insufficient documentation

## 2011-12-15 DIAGNOSIS — I517 Cardiomegaly: Secondary | ICD-10-CM | POA: Insufficient documentation

## 2011-12-15 DIAGNOSIS — J4489 Other specified chronic obstructive pulmonary disease: Secondary | ICD-10-CM | POA: Insufficient documentation

## 2011-12-15 DIAGNOSIS — Z954 Presence of other heart-valve replacement: Secondary | ICD-10-CM | POA: Insufficient documentation

## 2011-12-15 DIAGNOSIS — J449 Chronic obstructive pulmonary disease, unspecified: Secondary | ICD-10-CM | POA: Insufficient documentation

## 2011-12-15 DIAGNOSIS — I2510 Atherosclerotic heart disease of native coronary artery without angina pectoris: Secondary | ICD-10-CM | POA: Insufficient documentation

## 2011-12-15 DIAGNOSIS — E785 Hyperlipidemia, unspecified: Secondary | ICD-10-CM | POA: Insufficient documentation

## 2011-12-15 DIAGNOSIS — I059 Rheumatic mitral valve disease, unspecified: Secondary | ICD-10-CM | POA: Insufficient documentation

## 2011-12-15 NOTE — Telephone Encounter (Signed)
Received certified letter receipt received [phone]

## 2011-12-15 NOTE — Progress Notes (Signed)
*  PRELIMINARY RESULTS* Echocardiogram 2D Echocardiogram has been performed.  Caswell Corwin 12/15/2011, 3:17 PM

## 2011-12-18 ENCOUNTER — Encounter: Payer: Self-pay | Admitting: Cardiology

## 2012-01-03 ENCOUNTER — Encounter: Payer: Self-pay | Admitting: Cardiology

## 2012-01-12 ENCOUNTER — Other Ambulatory Visit: Payer: Self-pay | Admitting: *Deleted

## 2012-01-12 DIAGNOSIS — E782 Mixed hyperlipidemia: Secondary | ICD-10-CM

## 2012-01-13 LAB — LIPID PANEL
Cholesterol: 122 mg/dL (ref 0–200)
HDL: 33 mg/dL — ABNORMAL LOW (ref >39)
LDL Cholesterol: 65 mg/dL (ref 0–99)
Triglycerides: 122 mg/dL (ref <150)

## 2012-04-26 ENCOUNTER — Encounter: Payer: Self-pay | Admitting: Cardiology

## 2012-04-26 ENCOUNTER — Ambulatory Visit (INDEPENDENT_AMBULATORY_CARE_PROVIDER_SITE_OTHER): Payer: Medicaid Other | Admitting: Cardiology

## 2012-04-26 ENCOUNTER — Encounter: Payer: Self-pay | Admitting: *Deleted

## 2012-04-26 VITALS — BP 120/78 | HR 74 | Ht 68.0 in | Wt 182.4 lb

## 2012-04-26 DIAGNOSIS — I2510 Atherosclerotic heart disease of native coronary artery without angina pectoris: Secondary | ICD-10-CM

## 2012-04-26 DIAGNOSIS — I10 Essential (primary) hypertension: Secondary | ICD-10-CM

## 2012-04-26 DIAGNOSIS — I7090 Unspecified atherosclerosis: Secondary | ICD-10-CM

## 2012-04-26 DIAGNOSIS — E785 Hyperlipidemia, unspecified: Secondary | ICD-10-CM

## 2012-04-26 DIAGNOSIS — I679 Cerebrovascular disease, unspecified: Secondary | ICD-10-CM

## 2012-04-26 MED ORDER — CARVEDILOL 12.5 MG PO TABS: 12.5000 mg | ORAL_TABLET | Freq: Two times a day (BID) | ORAL | Status: DC

## 2012-04-26 MED ORDER — CARVEDILOL 6.25 MG PO TABS: 6.2500 mg | ORAL_TABLET | Freq: Two times a day (BID) | ORAL | Status: DC

## 2012-04-26 MED ORDER — LISINOPRIL 5 MG PO TABS: 5.0000 mg | ORAL_TABLET | Freq: Every day | ORAL | Status: DC

## 2012-04-26 NOTE — Assessment & Plan Note (Signed)
Patient has done well following left carotid endarterectomy.  Annual or biannual monitoring with carotid ultrasound as appropriate.

## 2012-04-26 NOTE — Progress Notes (Deleted)
Name: Charles Savage    DOB: 1955-08-11  Age: 56 y.o.  MR#: [id number]       PCP:  Colette Ribas, MD      Insurance: @PAYORNAME @   CC:   No chief complaint on file.  MEDICATION LIST  VS BP 120/78  Pulse 74  Ht 5\' 8"  (1.727 m)  Wt 182 lb 6.4 oz (82.736 kg)  BMI 27.73 kg/m2  SpO2 96%  Weights Current Weight  04/26/12 182 lb 6.4 oz (82.736 kg)  12/08/11 168 lb (76.204 kg)  11/14/11 170 lb (77.111 kg)    Blood Pressure  BP Readings from Last 3 Encounters:  04/26/12 120/78  12/08/11 109/71  11/14/11 93/62     Admit date:  (Not on file) Last encounter with RMR:  01/03/2012   Allergy Allergies  Allergen Reactions  . Metoprolol Shortness Of Breath and Nausea And Vomiting  . Morphine And Related Hives and Itching  . Other Other (See Comments)    Peaches cause hives and itching   . Peach Flavor Hives and Itching    Current Outpatient Prescriptions  Medication Sig Dispense Refill  . albuterol (PROVENTIL HFA;VENTOLIN HFA) 108 (90 BASE) MCG/ACT inhaler Inhale 1-2 puffs into the lungs every 6 (six) hours as needed for wheezing.  1 Inhaler  0  . aspirin 325 MG tablet Take 325 mg by mouth daily.      . carvedilol (COREG) 3.125 MG tablet Take 1 tablet (3.125 mg total) by mouth 2 (two) times daily with a meal.  60 tablet  0  . docusate sodium (COLACE) 100 MG capsule Take 100 mg by mouth daily.      Marland Kitchen HYDROcodone-acetaminophen (NORCO) 10-325 MG per tablet Take 1 tablet by mouth every 8 (eight) hours as needed. Pain  21 tablet  0  . simvastatin (ZOCOR) 40 MG tablet Take 1 tablet (40 mg total) by mouth at bedtime.  30 tablet  12    Discontinued Meds:    Medications Discontinued During This Encounter  Medication Reason  . ALPRAZolam (XANAX) 0.25 MG tablet Error  . cyclobenzaprine (FLEXERIL) 10 MG tablet Error    Patient Active Problem List  Diagnosis  . Hyperlipemia  . Lumbar disc disease  . Cerebrovascular disease  . Tobacco abuse, in remission  . Arteriosclerotic  cardiovascular disease (ASCVD)  . Interstitial lung disease  . COPD (chronic obstructive pulmonary disease)  . Laboratory test    LABS No visits with results within 3 Month(s) from this visit. Latest known visit with results is:  Orders Only on 01/12/2012  Component Date Value  . Cholesterol 01/12/2012 122   . Triglycerides 01/12/2012 122   . HDL 01/12/2012 33*  . Total CHOL/HDL Ratio 01/12/2012 3.7   . VLDL 01/12/2012 24   . LDL Cholesterol 01/12/2012 65      Results for this Opt Visit:     Results for orders placed in visit on 01/12/12  LIPID PANEL      Component Value Range   Cholesterol 122  0 - 200 mg/dL   Triglycerides 440  <347 mg/dL   HDL 33 (*) >42 mg/dL   Total CHOL/HDL Ratio 3.7     VLDL 24  0 - 40 mg/dL   LDL Cholesterol 65  0 - 99 mg/dL    EKG Orders placed in visit on 11/03/11  . EKG 12-LEAD     Prior Assessment and Plan Problem List as of 04/26/2012  Cardiology Problems   Hyperlipemia   Last Assessment & Plan Note   12/08/2011 Office Visit Signed 12/10/2011  8:48 PM by Kathlen Brunswick, MD    Lipids have been reassessed and require pharmacologic therapy.  Simvastatin 40 mg per day will be added to Mr. Hepper medical regime.    Cerebrovascular disease   Arteriosclerotic cardiovascular disease (ASCVD)   Last Assessment & Plan Note   12/08/2011 Office Visit Signed 12/10/2011  8:47 PM by Kathlen Brunswick, MD    Patient is doing extremely well following combined CABG/MV repair/annuloplasty ring after presenting with severe CHF.  Current medical regime will be maintained.  Laboratory studies will be repeated to assess for resolution of peri-operative abnormalities including acute renal insufficiency, electrolyte disturbance, abnormal LFTs and anemia.      Other   Lumbar disc disease   Tobacco abuse, in remission   Last Assessment & Plan Note   12/08/2011 Office Visit Signed 12/10/2011  8:49 PM by Kathlen Brunswick, MD    Patient  congratulated on abstaining from tobacco use since his acute illness began 3 months ago.    Interstitial lung disease   COPD (chronic obstructive pulmonary disease)   Laboratory test       Imaging: No results found.   FRS Calculation: Score not calculated. Missing: Total Cholesterol

## 2012-04-26 NOTE — Patient Instructions (Addendum)
Your physician recommends that you schedule a follow-up appointment in:  1 - 2 months with Dr Dietrich Pates 2 - Next available with Dr Ladona Ridgel to discuss ICD  Your physician has recommended you make the following change in your medication:  1 - Increase Coreg (Carvedilol) to 6.25 mg twice a day for 1 month then increase to 12.5 mg twice daily thereafter. 2 - START Lisinopril 5 mg daily  Your physician recommends that you return for lab work in: In 3 weeks (you will receive a reminder letter)  Record your weight at home and call if you gain 5 pounds

## 2012-04-26 NOTE — Progress Notes (Signed)
Patient ID: Charles Savage, male   DOB: 06-15-1956, 56 y.o.   MRN: [id number]  HPI: Scheduled return visit for this nice gentleman with ischemic cardiomyopathy and valvular heart disease.  Since surgical intervention earlier this year, he has done generally well.  His lifestyle is somewhat active, and he denies dyspnea or chest discomfort.  He has not required diuretic therapy for fluid retention.  His most recent ejection fraction was 20-25% by echocardiography in 11/2011.   Prior to Admission medications   Medication Sig Start Date End Date Taking? Authorizing Provider  albuterol (PROVENTIL HFA;VENTOLIN HFA) 108 (90 BASE) MCG/ACT inhaler Inhale 1-2 puffs into the lungs every 6 (six) hours as needed for wheezing. 09/27/11 09/26/12 Yes Shelda Jakes, MD  ASA Take 325 mg by mouth daily.   Yes Historical Provider, MD  docusate sodium (COLACE) 100 MG capsule Take 100 mg by mouth daily.   Yes Historical Provider, MD  HYDROcodone-acetaminophen (NORCO) 10-325 MG per tablet Take 1 tablet by mouth every 8 (eight) hours as needed. Pain 11/03/11  Yes Jodelle Gross, NP  simvastatin (ZOCOR) 40 MG tablet Take 1 tablet (40 mg total) by mouth at bedtime. 12/12/11 12/11/12 Yes Kathlen Brunswick, MD  carvedilol (COREG) 12.5 MG tablet Take 1 tablet (12.5 mg total) by mouth 2 (two) times daily. 04/26/12   Kathlen Brunswick, MD  lisinopril (PRINIVIL,ZESTRIL) 5 MG tablet Take 1 tablet (5 mg total) by mouth daily. 04/26/12   Kathlen Brunswick, MD    Allergies  Allergen Reactions  . Metoprolol Shortness Of Breath and Nausea And Vomiting  . Morphine And Related Hives and Itching  . Other Other (See Comments)    Peaches cause hives and itching   . Peach Flavor Hives and Itching      Past medical history, social history, and family history reviewed and updated.  ROS: Denies chest discomfort, dyspnea on exertion, orthopnea, PND or ankle edema.  All other systems reviewed and are negative.  PHYSICAL EXAM: BP 120/78   Pulse 74  Ht 5\' 8"  (1.727 m)  Wt 82.736 kg (182 lb 6.4 oz)  BMI 27.73 kg/m2  SpO2 96%  General-Well developed; no acute distress Body habitus-proportionate weight and height Neck-No JVD; Left carotid bruit Lungs-clear lung fields; resonant to percussion Cardiovascular-normal PMI; normal S1 and S2 Abdomen-normal bowel sounds; soft and non-tender without masses or organomegaly Musculoskeletal-No deformities, no cyanosis or clubbing Neurologic-Normal cranial nerves; symmetric strength and tone Skin-Warm, no significant lesions Extremities-distal pulses intact; no edema  ASSESSMENT AND PLAN:  Charles Bing, MD 04/26/2012 2:23 PM

## 2012-04-26 NOTE — Assessment & Plan Note (Signed)
Excellent control of hyperlipidemia; current medication will be continued. 

## 2012-04-26 NOTE — Assessment & Plan Note (Addendum)
Patient has developed severe left ventricular dysfunction following mitral valve repair and CABG surgery.  This could be due to the decrease in mitral regurgitation or to perioperative left ventricular injury.  In any case, we will optimize medical therapy.  The benefits of an implanted defibrillator were discussed and patient approves referral to Dr. Ladona Ridgel for consideration of an implant.  He is cautioned to weigh himself regularly and to report any substantial increase.  He's advised to report the development of edema, dyspnea, orthopnea or chest discomfort.

## 2012-05-10 ENCOUNTER — Other Ambulatory Visit: Payer: Self-pay | Admitting: *Deleted

## 2012-05-10 DIAGNOSIS — I10 Essential (primary) hypertension: Secondary | ICD-10-CM

## 2012-05-21 LAB — BASIC METABOLIC PANEL
BUN: 12 mg/dL (ref 6–23)
CO2: 28 mEq/L (ref 19–32)
Calcium: 9.8 mg/dL (ref 8.4–10.5)
Creat: 0.75 mg/dL (ref 0.50–1.35)

## 2012-05-22 ENCOUNTER — Encounter: Payer: Self-pay | Admitting: *Deleted

## 2012-05-27 ENCOUNTER — Encounter: Payer: Self-pay | Admitting: Internal Medicine

## 2012-05-27 ENCOUNTER — Ambulatory Visit (INDEPENDENT_AMBULATORY_CARE_PROVIDER_SITE_OTHER): Payer: Medicare Other | Admitting: Internal Medicine

## 2012-05-27 VITALS — BP 104/64 | HR 79 | Ht 68.0 in | Wt 180.0 lb

## 2012-05-27 DIAGNOSIS — F17201 Nicotine dependence, unspecified, in remission: Secondary | ICD-10-CM

## 2012-05-27 DIAGNOSIS — I5022 Chronic systolic (congestive) heart failure: Secondary | ICD-10-CM | POA: Insufficient documentation

## 2012-05-27 DIAGNOSIS — Z87891 Personal history of nicotine dependence: Secondary | ICD-10-CM

## 2012-05-27 NOTE — Assessment & Plan Note (Signed)
Unfortunately, the patient has returned to smoking. I discussed the importance of smoking cessation with the patient and his family.

## 2012-05-27 NOTE — Patient Instructions (Addendum)
Your physician recommends that you schedule a follow-up appointment in: AS NEEDED  

## 2012-05-27 NOTE — Assessment & Plan Note (Signed)
The patient has chronic systolic heart failure, class II, with an ejection fraction of 15-20% by echo. I've discussed the risk, benefits, goals, and expectations of ICD implantation for primary prevention. He is considering his options, and will call us if you would like to proceed with prophylactic ICD implantation.

## 2012-05-27 NOTE — Progress Notes (Signed)
HPI Charles Savage is referred today by Dr. Rothbart for consideration for ICD implantation. The patient is a very pleasant 56-year-old man with a long history of tobacco abuse. He presented with a stroke several months ago and was found to have tight carotid disease and underwent endarterectomy. He was also found to have three-vessel coronary disease and mitral regurgitation and underwent bypass and mitral valve repair. 3 months postsurgically, repeat echo demonstrated an ejection fraction of 15-20%. His heart failure symptoms are class II. He has been on a stable medical regimen including lisinopril and carvedilol at optimized doses. He has not had any syncope. Allergies  Allergen Reactions  . Metoprolol Shortness Of Breath and Nausea And Vomiting  . Morphine And Related Hives and Itching  . Other Other (See Comments)    Peaches cause hives and itching   . Peach Flavor Hives and Itching     Current Outpatient Prescriptions  Medication Sig Dispense Refill  . albuterol (PROVENTIL HFA;VENTOLIN HFA) 108 (90 BASE) MCG/ACT inhaler Inhale 1-2 puffs into the lungs every 6 (six) hours as needed for wheezing.  1 Inhaler  0  . aspirin 325 MG tablet Take 325 mg by mouth daily.      . carvedilol (COREG) 12.5 MG tablet Take 1 tablet (12.5 mg total) by mouth 2 (two) times daily.  180 tablet  3  . docusate sodium (COLACE) 100 MG capsule Take 100 mg by mouth daily.      . HYDROcodone-acetaminophen (NORCO) 10-325 MG per tablet Take 1 tablet by mouth every 8 (eight) hours as needed. Pain  21 tablet  0  . lisinopril (PRINIVIL,ZESTRIL) 5 MG tablet Take 1 tablet (5 mg total) by mouth daily.  30 tablet  6  . simvastatin (ZOCOR) 40 MG tablet Take 1 tablet (40 mg total) by mouth at bedtime.  30 tablet  12     Past Medical History  Diagnosis Date  . Cerebrovascular disease     h/o CVA in 07/2011 + left carotid endarterectomy  . Hyperlipemia   . Lumbar disc disease     with chronic back pain  . Pneumonia   . COPD  (chronic obstructive pulmonary disease)     on 2L O2 at home since 08/2011  . 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 Tobacco abuse, in remission     60 pack years, discontinued in 08/2011    ROS:   All systems reviewed and negative except as noted in the HPI.   Past Surgical History  Procedure Date  . Back surgery 1996,2012  . Knee arthroscopy 2002    rt  . Foreign body removal 06/05/2011    Procedure: FOREIGN BODY REMOVAL ADULT;  Surgeon: Gerald L Truesdale;  Location: [facility];  Service: Plastics;  Laterality: Right;  removal foreign body from right side face   . Carotid endarterectomy   . Tee without cardioversion 10/17/2011    Procedure: TRANSESOPHAGEAL ECHOCARDIOGRAM (TEE);  Surgeon: Daniel R Bensimhon, MD;  Location: [facility];  Service: Cardiovascular;  Laterality: N/A;  . Coronary artery bypass graft 10/20/2011    Procedure: CORONARY ARTERY BYPASS GRAFTING (CABG);  Surgeon: Bryan K Bartle, MD;  Location: [facility] OR;  Service: Open Heart Surgery;  Laterality: N/A;  CABG x six;  using left internal mammary artery and right leg greater saphenous vein harvested endoscopically  . Mitral valve repair 10/20/2011    Procedure: MITRAL VALVE REPAIR (MVR);  Surgeon: Bryan K Bartle,   MD;  Location: [facility] OR;  Service: Open Heart Surgery;  Laterality: N/A;     Family History  Problem Relation Age of Onset  . Coronary artery disease Mother   . Coronary artery disease Father   . Arrhythmia Brother      History   Social History  . Marital Status: Married    Spouse Name: N/A    Number of Children: N/A  . Years of Education: N/A   Occupational History  . Not on file.   Social History Main Topics  . Smoking status: Current Every Day Smoker -- 1.5 packs/day for 30 years    Types: Cigarettes  . Smokeless tobacco: Not on file     Comment: quit from 09-10-2011 and started back smoking 03-26-2012  . Alcohol Use: No   . Drug Use: No  . Sexually Active: Not on file     Comment: Quit drinking alcohol 2 yrs ago   Other Topics Concern  . 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     BP 104/64  Pulse 79  Ht 5' 8" (1.727 m)  Wt 180 lb (81.647 kg)  BMI 27.37 kg/m2  SpO2 95%  Physical Exam:  Well appearing middle-aged man, NAD HEENT: Unremarkable Neck:  7 cm JVD, no thyromegally Lungs:  Clear with no wheezes, rales, or rhonchi. HEART:  Regular rate rhythm, no murmurs, no rubs, no clicks, PMI is enlarged and lateral displaced. Abd:  soft, positive bowel sounds, no organomegally, no rebound, no guarding Ext:  2 plus pulses, no edema, no cyanosis, no clubbing Skin:  No rashes no nodules Neuro:  CN II through XII intact, motor grossly intact  EKG Normal sinus rhythm with a narrow QRS  Assess/Plan:  

## 2012-05-28 ENCOUNTER — Other Ambulatory Visit: Payer: Self-pay | Admitting: *Deleted

## 2012-05-28 ENCOUNTER — Telehealth: Payer: Self-pay | Admitting: Internal Medicine

## 2012-05-28 ENCOUNTER — Encounter: Payer: Self-pay | Admitting: *Deleted

## 2012-05-28 DIAGNOSIS — Z01812 Encounter for preprocedural laboratory examination: Secondary | ICD-10-CM

## 2012-05-28 NOTE — Telephone Encounter (Signed)
Please advise 

## 2012-05-28 NOTE — Telephone Encounter (Signed)
Scheduled for ICD placement for 06/14/12 @ 10:30 with Dr Ladona Ridgel.  Wife will come by office for instructions.  Will notify [employer] of need for orders.

## 2012-05-28 NOTE — Telephone Encounter (Signed)
Would like to schedule defib placement. / tg

## 2012-06-12 ENCOUNTER — Other Ambulatory Visit: Payer: Self-pay | Admitting: Cardiology

## 2012-06-12 LAB — PROTIME-INR
INR: 1.03 (ref <1.50)
Prothrombin Time: 13.5 seconds (ref 11.6–15.2)

## 2012-06-12 LAB — BASIC METABOLIC PANEL
BUN: 13 mg/dL (ref 6–23)
Calcium: 9.4 mg/dL (ref 8.4–10.5)
Creat: 0.94 mg/dL (ref 0.50–1.35)
Glucose, Bld: 95 mg/dL (ref 70–99)
Potassium: 5.1 mEq/L (ref 3.5–5.3)

## 2012-06-12 LAB — CBC
Hemoglobin: 14 g/dL (ref 13.0–17.0)
MCHC: 34 g/dL (ref 30.0–36.0)
Platelets: 279 10*3/uL (ref 150–400)
RDW: 14 % (ref 11.5–15.5)

## 2012-06-13 ENCOUNTER — Encounter: Payer: Self-pay | Admitting: Cardiology

## 2012-06-13 MED ORDER — CEFAZOLIN SODIUM-DEXTROSE 2-3 GM-% IV SOLR
2.0000 g | INTRAVENOUS | Status: DC
  Filled 2012-06-13: qty 50

## 2012-06-13 MED ORDER — SODIUM CHLORIDE 0.9 % IR SOLN
80.0000 mg | Status: DC
  Filled 2012-06-13: qty 2

## 2012-06-14 ENCOUNTER — Encounter (HOSPITAL_COMMUNITY)
Admission: RE | Disposition: A | Discharge status: Home / Self Care | Payer: Self-pay | Source: Ambulatory Visit | Attending: Internal Medicine

## 2012-06-14 ENCOUNTER — Encounter (HOSPITAL_COMMUNITY): Payer: Self-pay | Admitting: General Practice

## 2012-06-14 ENCOUNTER — Ambulatory Visit (HOSPITAL_COMMUNITY)
Admission: RE | Admit: 2012-06-14 | Discharge: 2012-06-15 | Disposition: A | Discharge status: Home / Self Care | Payer: Medicare Other | Source: Ambulatory Visit | Attending: Internal Medicine | Admitting: Internal Medicine

## 2012-06-14 DIAGNOSIS — I509 Heart failure, unspecified: Secondary | ICD-10-CM | POA: Insufficient documentation

## 2012-06-14 DIAGNOSIS — J4489 Other specified chronic obstructive pulmonary disease: Secondary | ICD-10-CM | POA: Insufficient documentation

## 2012-06-14 DIAGNOSIS — I5022 Chronic systolic (congestive) heart failure: Secondary | ICD-10-CM | POA: Insufficient documentation

## 2012-06-14 DIAGNOSIS — I2589 Other forms of chronic ischemic heart disease: Secondary | ICD-10-CM

## 2012-06-14 DIAGNOSIS — J449 Chronic obstructive pulmonary disease, unspecified: Secondary | ICD-10-CM | POA: Diagnosis present

## 2012-06-14 DIAGNOSIS — I252 Old myocardial infarction: Secondary | ICD-10-CM | POA: Insufficient documentation

## 2012-06-14 DIAGNOSIS — Z23 Encounter for immunization: Secondary | ICD-10-CM | POA: Insufficient documentation

## 2012-06-14 DIAGNOSIS — J90 Pleural effusion, not elsewhere classified: Secondary | ICD-10-CM | POA: Insufficient documentation

## 2012-06-14 DIAGNOSIS — Z95810 Presence of automatic (implantable) cardiac defibrillator: Secondary | ICD-10-CM

## 2012-06-14 HISTORY — DX: Low back pain: M54.5

## 2012-06-14 HISTORY — DX: Other intervertebral disc degeneration, lumbosacral region: M51.37

## 2012-06-14 HISTORY — DX: Major depressive disorder, single episode, unspecified: F32.9

## 2012-06-14 HISTORY — PX: CARDIAC DEFIBRILLATOR PLACEMENT: SHX171

## 2012-06-14 HISTORY — DX: Other chronic pain: G89.29

## 2012-06-14 HISTORY — DX: Presence of automatic (implantable) cardiac defibrillator: Z95.810

## 2012-06-14 HISTORY — PX: IMPLANTABLE CARDIOVERTER DEFIBRILLATOR IMPLANT: SHX5473

## 2012-06-14 HISTORY — DX: Acute myocardial infarction, unspecified: I21.9

## 2012-06-14 HISTORY — DX: Other intervertebral disc degeneration, lumbosacral region without mention of lumbar back pain or lower extremity pain: M51.379

## 2012-06-14 LAB — SURGICAL PCR SCREEN
MRSA, PCR: NEGATIVE
Staphylococcus aureus: NEGATIVE

## 2012-06-14 SURGERY — IMPLANTABLE CARDIOVERTER DEFIBRILLATOR IMPLANT
Anesthesia: LOCAL

## 2012-06-14 MED ORDER — INFLUENZA VIRUS VACC SPLIT PF IM SUSP
0.5000 mL | INTRAMUSCULAR | Status: AC
  Administered 2012-06-15: 0.5 mL via INTRAMUSCULAR
  Filled 2012-06-14: qty 0.5

## 2012-06-14 MED ORDER — SODIUM CHLORIDE 0.9 % IJ SOLN: 3.0000 mL | Freq: Two times a day (BID) | INTRAMUSCULAR | Status: DC

## 2012-06-14 MED ORDER — ESCITALOPRAM OXALATE 20 MG PO TABS
20.0000 mg | ORAL_TABLET | Freq: Every day | ORAL | Status: DC
  Administered 2012-06-15: 20 mg via ORAL
  Filled 2012-06-14: qty 1

## 2012-06-14 MED ORDER — SODIUM CHLORIDE 0.9 % IV SOLN: 250.0000 mL | INTRAVENOUS | Status: DC

## 2012-06-14 MED ORDER — SODIUM CHLORIDE 0.45 % IV SOLN
INTRAVENOUS | Status: DC
  Administered 2012-06-14: 09:00:00 via INTRAVENOUS

## 2012-06-14 MED ORDER — ACETAMINOPHEN 325 MG PO TABS: 325.0000 mg | ORAL_TABLET | ORAL | Status: DC | PRN

## 2012-06-14 MED ORDER — PNEUMOCOCCAL VAC POLYVALENT 25 MCG/0.5ML IJ INJ
0.5000 mL | INJECTION | INTRAMUSCULAR | Status: AC
  Administered 2012-06-15: 0.5 mL via INTRAMUSCULAR
  Filled 2012-06-14: qty 0.5

## 2012-06-14 MED ORDER — SODIUM CHLORIDE 0.9 % IJ SOLN: 3.0000 mL | INTRAMUSCULAR | Status: DC | PRN

## 2012-06-14 MED ORDER — ONDANSETRON HCL 4 MG/2ML IJ SOLN: 4.0000 mg | Freq: Four times a day (QID) | INTRAMUSCULAR | Status: DC | PRN

## 2012-06-14 MED ORDER — SIMVASTATIN 40 MG PO TABS
40.0000 mg | ORAL_TABLET | Freq: Every day | ORAL | Status: DC
  Administered 2012-06-14: 40 mg via ORAL
  Filled 2012-06-14 (×2): qty 1

## 2012-06-14 MED ORDER — MIDAZOLAM HCL 5 MG/5ML IJ SOLN
INTRAMUSCULAR | Status: AC
  Filled 2012-06-14: qty 5

## 2012-06-14 MED ORDER — LISINOPRIL 5 MG PO TABS
5.0000 mg | ORAL_TABLET | Freq: Every day | ORAL | Status: DC
  Administered 2012-06-15: 5 mg via ORAL
  Filled 2012-06-14: qty 1

## 2012-06-14 MED ORDER — CHLORHEXIDINE GLUCONATE 4 % EX LIQD
60.0000 mL | Freq: Once | CUTANEOUS | Status: DC
  Filled 2012-06-14: qty 60

## 2012-06-14 MED ORDER — CEFAZOLIN SODIUM-DEXTROSE 2-3 GM-% IV SOLR
INTRAVENOUS | Status: AC
  Filled 2012-06-14: qty 50

## 2012-06-14 MED ORDER — LIDOCAINE HCL (PF) 1 % IJ SOLN
INTRAMUSCULAR | Status: AC
  Filled 2012-06-14: qty 60

## 2012-06-14 MED ORDER — DOCUSATE SODIUM 100 MG PO CAPS
100.0000 mg | ORAL_CAPSULE | Freq: Every day | ORAL | Status: DC
  Administered 2012-06-14 – 2012-06-15 (×2): 100 mg via ORAL
  Filled 2012-06-14 (×2): qty 1

## 2012-06-14 MED ORDER — FENTANYL CITRATE 0.05 MG/ML IJ SOLN
INTRAMUSCULAR | Status: AC
  Filled 2012-06-14: qty 2

## 2012-06-14 MED ORDER — ASPIRIN 325 MG PO TABS
325.0000 mg | ORAL_TABLET | Freq: Every day | ORAL | Status: DC
  Administered 2012-06-15: 325 mg via ORAL
  Filled 2012-06-14: qty 1

## 2012-06-14 MED ORDER — ALBUTEROL SULFATE HFA 108 (90 BASE) MCG/ACT IN AERS
1.0000 | INHALATION_SPRAY | Freq: Four times a day (QID) | RESPIRATORY_TRACT | Status: DC | PRN
  Filled 2012-06-14: qty 6.7

## 2012-06-14 MED ORDER — MELOXICAM 15 MG PO TABS
15.0000 mg | ORAL_TABLET | Freq: Every day | ORAL | Status: DC
  Administered 2012-06-15: 15 mg via ORAL
  Filled 2012-06-14: qty 1

## 2012-06-14 MED ORDER — CARVEDILOL 12.5 MG PO TABS
12.5000 mg | ORAL_TABLET | Freq: Two times a day (BID) | ORAL | Status: DC
  Administered 2012-06-14 – 2012-06-15 (×2): 12.5 mg via ORAL
  Filled 2012-06-14 (×4): qty 1

## 2012-06-14 MED ORDER — HEPARIN (PORCINE) IN NACL 2-0.9 UNIT/ML-% IJ SOLN
INTRAMUSCULAR | Status: AC
  Filled 2012-06-14: qty 500

## 2012-06-14 MED ORDER — CEFAZOLIN SODIUM-DEXTROSE 2-3 GM-% IV SOLR
2.0000 g | Freq: Four times a day (QID) | INTRAVENOUS | Status: AC
  Administered 2012-06-14 – 2012-06-15 (×2): 2 g via INTRAVENOUS
  Filled 2012-06-14 (×3): qty 50

## 2012-06-14 MED ORDER — MUPIROCIN 2 % EX OINT
TOPICAL_OINTMENT | CUTANEOUS | Status: AC
  Administered 2012-06-14: 1
  Filled 2012-06-14: qty 22

## 2012-06-14 MED ORDER — HYDROCODONE-ACETAMINOPHEN 10-325 MG PO TABS
1.0000 | ORAL_TABLET | Freq: Three times a day (TID) | ORAL | Status: DC | PRN
  Administered 2012-06-14 – 2012-06-15 (×2): 1 via ORAL
  Filled 2012-06-14 (×2): qty 1

## 2012-06-14 NOTE — Interval H&P Note (Signed)
History and Physical Interval Note:  06/14/2012 11:32 AM  Charles Savage  has presented today for surgery, with the diagnosis of Heart failure  The various methods of treatment have been discussed with the patient and family. After consideration of risks, benefits and other options for treatment, the patient has consented to  Procedure(s) (LRB) with comments: IMPLANTABLE CARDIOVERTER DEFIBRILLATOR IMPLANT (N/A) as a surgical intervention .  The patient's history has been reviewed, patient examined, no change in status, stable for surgery.  I have reviewed the patient's chart and labs.  Questions were answered to the patient's satisfaction.     Leonia Reeves.D.

## 2012-06-14 NOTE — H&P (View-Only) (Signed)
HPI Charles Savage is referred today by Dr. Dietrich Pates for consideration for ICD implantation. The patient is a very pleasant 56 year old man with a long history of tobacco abuse. He presented with a stroke several months ago and was found to have tight carotid disease and underwent endarterectomy. He was also found to have three-vessel coronary disease and mitral regurgitation and underwent bypass and mitral valve repair. 3 months postsurgically, repeat echo demonstrated an ejection fraction of 15-20%. His heart failure symptoms are class II. He has been on a stable medical regimen including lisinopril and carvedilol at optimized doses. He has not had any syncope. Allergies  Allergen Reactions  . Metoprolol Shortness Of Breath and Nausea And Vomiting  . Morphine And Related Hives and Itching  . Other Other (See Comments)    Peaches cause hives and itching   . Peach Flavor Hives and Itching     Current Outpatient Prescriptions  Medication Sig Dispense Refill  . albuterol (PROVENTIL HFA;VENTOLIN HFA) 108 (90 BASE) MCG/ACT inhaler Inhale 1-2 puffs into the lungs every 6 (six) hours as needed for wheezing.  1 Inhaler  0  . aspirin 325 MG tablet Take 325 mg by mouth daily.      . carvedilol (COREG) 12.5 MG tablet Take 1 tablet (12.5 mg total) by mouth 2 (two) times daily.  180 tablet  3  . docusate sodium (COLACE) 100 MG capsule Take 100 mg by mouth daily.      Marland Kitchen HYDROcodone-acetaminophen (NORCO) 10-325 MG per tablet Take 1 tablet by mouth every 8 (eight) hours as needed. Pain  21 tablet  0  . lisinopril (PRINIVIL,ZESTRIL) 5 MG tablet Take 1 tablet (5 mg total) by mouth daily.  30 tablet  6  . simvastatin (ZOCOR) 40 MG tablet Take 1 tablet (40 mg total) by mouth at bedtime.  30 tablet  12     Past Medical History  Diagnosis Date  . Cerebrovascular disease     h/o CVA in 07/2011 + left carotid endarterectomy  . Hyperlipemia   . Lumbar disc disease     with chronic back pain  . Pneumonia   . COPD  (chronic obstructive pulmonary disease)     on 2L O2 at home since 08/2011  . 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 Tobacco abuse, in remission     60 pack years, discontinued in 08/2011    ROS:   All systems reviewed and negative except as noted in the HPI.   Past Surgical History  Procedure Date  . Back surgery 8119,1478  . Knee arthroscopy 2002    rt  . Foreign body removal 06/05/2011    Procedure: FOREIGN BODY REMOVAL ADULT;  Surgeon: Rosalio Macadamia;  Location: [facility];  Service: Plastics;  Laterality: Right;  removal foreign body from right side face   . Carotid endarterectomy   . Tee without cardioversion 10/17/2011    Procedure: TRANSESOPHAGEAL ECHOCARDIOGRAM (TEE);  Surgeon: Dolores Patty, MD;  Location: [facility];  Service: Cardiovascular;  Laterality: N/A;  . Coronary artery bypass graft 10/20/2011    Procedure: CORONARY ARTERY BYPASS GRAFTING (CABG);  Surgeon: Alleen Borne, MD;  Location: [facility] OR;  Service: Open Heart Surgery;  Laterality: N/A;  CABG x six;  using left internal mammary artery and right leg greater saphenous vein harvested endoscopically  . Mitral valve repair 10/20/2011    Procedure: MITRAL VALVE REPAIR (MVR);  Surgeon: Alleen Borne,  MD;  Location: [facility] OR;  Service: Open Heart Surgery;  Laterality: N/A;     Family History  Problem Relation Age of Onset  . Coronary artery disease Mother   . Coronary artery disease Father   . Arrhythmia Brother      History   Social History  . Marital Status: Married    Spouse Name: N/A    Number of Children: N/A  . Years of Education: N/A   Occupational History  . Not on file.   Social History Main Topics  . Smoking status: Current Every Day Smoker -- 1.5 packs/day for 30 years    Types: Cigarettes  . Smokeless tobacco: Not on file     Comment: quit from 09-10-2011 and started back smoking 03-26-2012  . Alcohol Use: No   . Drug Use: No  . Sexually Active: Not on file     Comment: Quit drinking alcohol 2 yrs ago   Other Topics Concern  . 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     BP 104/64  Pulse 79  Ht 5\' 8"  (1.727 m)  Wt 180 lb (81.647 kg)  BMI 27.37 kg/m2  SpO2 95%  Physical Exam:  Well appearing middle-aged man, NAD HEENT: Unremarkable Neck:  7 cm JVD, no thyromegally Lungs:  Clear with no wheezes, rales, or rhonchi. HEART:  Regular rate rhythm, no murmurs, no rubs, no clicks, PMI is enlarged and lateral displaced. Abd:  soft, positive bowel sounds, no organomegally, no rebound, no guarding Ext:  2 plus pulses, no edema, no cyanosis, no clubbing Skin:  No rashes no nodules Neuro:  CN II through XII intact, motor grossly intact  EKG Normal sinus rhythm with a narrow QRS  Assess/Plan:

## 2012-06-14 NOTE — Op Note (Signed)
ICD implant via the left subclavian vein without immediate complication. W#[id number].

## 2012-06-15 ENCOUNTER — Ambulatory Visit (HOSPITAL_COMMUNITY): Payer: Medicare Other

## 2012-06-15 DIAGNOSIS — I5022 Chronic systolic (congestive) heart failure: Secondary | ICD-10-CM

## 2012-06-15 DIAGNOSIS — Z95810 Presence of automatic (implantable) cardiac defibrillator: Secondary | ICD-10-CM

## 2012-06-15 MED ORDER — FUROSEMIDE 20 MG PO TABS: 10.0000 mg | ORAL_TABLET | Freq: Every day | ORAL | Status: DC

## 2012-06-15 NOTE — Progress Notes (Signed)
Patient ID: Charles Savage, male   DOB: 1956-06-16, 56 y.o.   MRN: [id number]    Subjective:  Denies SSCP, palpitations or Dyspnea Has ambulated and gone to bathroom  Objective:  Filed Vitals:   06/14/12 1500 06/14/12 1652 06/14/12 2100 06/15/12 0435  BP: 93/61 98/54 99/52  125/72  Pulse:  67 69 66  Temp:   98.4 F (36.9 C) 97.7 F (36.5 C)  TempSrc:   Oral Oral  Resp:      Height:      Weight:    178 lb (80.74 kg)  SpO2:   97% 98%    Intake/Output from previous day:  Intake/Output Summary (Last 24 hours) at 06/15/12 1141 Last data filed at 06/15/12 0800  Gross per 24 hour  Intake    220 ml  Output    880 ml  Net   -660 ml    Physical Exam: Affect appropriate Healthy:  appears stated age HEENT: normal Neck supple with no adenopathy JVP normal no bruits no thyromegaly Lungs clear with no wheezing and good diaphragmatic motion Heart:  S1/S2 no murmur, no rub, gallop or click PMI enlarged  Abdomen: benighn, BS positve, no tenderness, no AAA no bruit.  No HSM or HJR Distal pulses intact with no bruits No edema Neuro non-focal Skin warm and dry No muscular weakness AICD under left clavicle no hematoma mildly tender to touch   Imaging: Dg Chest 2 View  06/15/2012  *RADIOLOGY REPORT*  Clinical Data: Post ICD placement  CHEST - 2 VIEW  Comparison: 11/14/2011; 10/23/2011  Findings:  Grossly unchanged cardiac silhouette and mediastinal contours post median sternotomy, CABG and valve replacement.  Interval placement of a left anterior chest wall single lead pacemaker with tip overlying the expected location of the right ventricular apex.  No definite pneumothorax.  Grossly unchanged chronic right-sided pleural effusion, which appears partially loculated.  Mild pulmonary venous congestion without frank evidence of edema.  No new focal airspace opacities.  Unchanged bones including chronic deformity of the distal end of the right clavicle.  IMPRESSION: 1.  Post left anterior  chest wall single lead pacemaker insertion without evidence of complication. 2.  Chronic small partially loculated right sided pleural effusion and right basilar heterogeneous opacities. 3.  Pulmonary congestion without frank evidence of edema.   Original Report Authenticated By: Tacey Ruiz, MD     Cardiac Studies:  ECG:    Telemetry:  intermitant pacing with PVC;s  Echo: 12/15/11  Study Conclusions  - Left ventricle: The cavity size was mildly to moderately dilated. Wall thickness was normal. Systolic function was severely reduced. The estimated ejection fraction was in the range of 15% to 20%. Diffuse hypokinesis. - Aortic valve: Mildly calcified annulus. Trileaflet. Trivial regurgitation. - Mitral valve: An annular ring prosthesis was present. - Left atrium: The atrium was moderately dilated. - Right ventricle: Systolic function was severely reduced. - Right atrium: The atrium was mildly dilated. - Atrial septum: No defect or patent foramen ovale was identified. - Pulmonary arteries: Systolic pressure was mildly increased. - Pericardium, extracardiac: There was a moderate-sized right pleural effusion.    Medications:     . aspirin  325 mg Oral Daily  . carvedilol  12.5 mg Oral BID WC  .  ceFAZolin (ANCEF) IV  2 g Intravenous Q6H  . docusate sodium  100 mg Oral Daily  . escitalopram  20 mg Oral Daily  . lisinopril  5 mg Oral Daily  . meloxicam  15 mg Oral  Daily  . simvastatin  40 mg Oral QHS       Assessment/Plan:  AICD:  CXR and site ok patient indicates medtronic interogation ok.  F/U Dr Ladona Ridgel in [place] CHF:  Severe RV and LV dysfunction with loculated right pleural effusion. Would send home on  Lasix 10 mg and f/u BNP and BMET in 2 weeks.  Initial f/u can be with Rothbart in 2-3 weeks  D/C home today  Charles Savage 06/15/2012, 11:41 AM

## 2012-06-15 NOTE — Discharge Summary (Signed)
Discharge Summary   Patient ID: Charles Savage,  MRN: [id number], DOB/AGE: 1956-04-28 56 y.o.  Admit date: 06/14/2012 Discharge date: 06/15/2012  Primary Physician: Charles Ribas, MD Primary Cardiologist: Charles Rossetti, MD; Charles Bunting, MD (EP)  Discharge Diagnoses Principal Problem:  *S/P ICD (internal cardiac defibrillator) procedure Active Problems:  COPD (chronic obstructive pulmonary disease)  Chronic systolic heart failure   Allergies Allergies  Allergen Reactions  . Metoprolol Shortness Of Breath and Nausea And Vomiting  . Peach Flavor Hives and Itching    "peaches; peach flavoring; actually happened after he ate a store-bought peach pie"  . Morphine And Related Hives and Itching    Diagnostic Studies/Procedures  MEDTRONIC SINGLE-CHAMBER ICD IMPLANTATION - 06/14/12  RESULTS: This demonstrates successful implantation of a Medtronic  single chamber defibrillator in a patient with a longstanding ischemic  cardiomyopathy, chronic systolic heart failure despite maximal medical  therapy with carvedilol and lisinopril.  PA/LATERAL CHEST X-RAY - 06/15/12  IMPRESSION:  1. Post left anterior chest wall single lead pacemaker insertion  without evidence of complication.  2. Chronic small partially loculated right sided pleural effusion  and right basilar heterogeneous opacities.  3. Pulmonary congestion without frank evidence of edema.  History of Present Illness/Hospital Course   Charles Savage is a 56 yo male who was referred to Charles Savage be Charles Savage for consideration of ICD implantation. 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 He has chronic systolic CHF (EF 56-21%), class II symptoms on a stable medical regimen. Given EF < 35%, the plan was made to pursue prophylactic ICD implantation.  He presented to [facility] on 06/14/12 for this procedure. He was informed, consented  and prepped. For full details, please see the operative report. This resulted in successful implantation of a Medtronic single-chamber ICD. He tolerated the procedure well without complications. He remained stable overnight. CXR the following morning revealed no evidence of PTX, but did show chronic loculated R-sided pleural effusion and R basilar heterogenous opacities. Pulmonary congestion without frank edema was noted. Device interrogation revealed normal functionality. He was assessed by Dr. Eden Savage who felt him to be stable for discharge. He recommended initiating low-dose Lasix given CHF and pleural effusion. He will be discharged on this, and the medications below. He will follow-up in the [place] office in 2-3 weeks. BMET and BNP will be drawn at that time. He will follow-up in the wound/device clinic in 7-10 days and Charles Savage in 3 months. This information, including activity restrictions, has been clearly outlined in the discharge AVS.   Discharge Vitals:  Blood pressure 125/72, pulse 66, temperature 97.7 F (36.5 C), temperature source Oral, resp. rate 17, height 5\' 8"  (1.727 m), weight 80.74 kg (178 lb), SpO2 98.00%.   Labs:  Lab 06/12/12 1019  NA 144  K 5.1  CL 103  CO2 27  BUN 13  CREATININE 0.94  CALCIUM 9.4  PROT --  BILITOT --  ALKPHOS --  ALT --  AST --  AMYLASE --  LIPASE --  GLUCOSE 95   Disposition:  Discharge Orders    Future Appointments: Provider: Department: Dept Phone: Center:   07/05/2012 1:30 PM Charles Brunswick, MD [facility] [place] [phone] [id number]     Future Orders Please Complete By Expires   Diet - low sodium heart healthy      Increase activity slowly  Follow-up Information    Follow up with [facility]. (Office will call you with an appointment date and time. )    Contact information:   [address] [place] [address]         Discharge Medications:    Medication List     As of  06/15/2012  2:06 PM    START taking these medications         furosemide 20 MG tablet   Commonly known as: LASIX   Take 0.5 tablets (10 mg total) by mouth daily.      CONTINUE taking these medications         albuterol 108 (90 BASE) MCG/ACT inhaler   Commonly known as: PROVENTIL HFA;VENTOLIN HFA   Inhale 1-2 puffs into the lungs every 6 (six) hours as needed for wheezing.      aspirin 325 MG tablet      carvedilol 12.5 MG tablet   Commonly known as: COREG   Take 1 tablet (12.5 mg total) by mouth 2 (two) times daily.      docusate sodium 100 MG capsule   Commonly known as: COLACE      escitalopram 20 MG tablet   Commonly known as: LEXAPRO      HYDROcodone-acetaminophen 10-325 MG per tablet   Commonly known as: NORCO   Take 1 tablet by mouth every 8 (eight) hours as needed. Pain      lisinopril 5 MG tablet   Commonly known as: PRINIVIL,ZESTRIL   Take 1 tablet (5 mg total) by mouth daily.      meloxicam 15 MG tablet   Commonly known as: MOBIC      simvastatin 40 MG tablet   Commonly known as: ZOCOR   Take 1 tablet (40 mg total) by mouth at bedtime.          Where to get your medications    These are the prescriptions that you need to pick up. We sent them to a specific pharmacy, so you will need to go there to get them.   [id number] - [place], [place] - [address]    [address] [place] [address]    Phone: [phone]        furosemide 20 MG tablet           Outstanding Labs/Studies: BMET and BNP in 2-3 weeks at [facility] office  Duration of Discharge Encounter: Greater than 30 minutes including physician time.  Signed, R. Hurman Horn, PA-C 06/15/2012, 2:06 PM

## 2012-06-15 NOTE — Op Note (Signed)
NAMEWINN, MUEHL               ACCOUNT NO.:  [id number]  MEDICAL RECORD NO.:  [id number]  LOCATION:  [id number]                        FACILITY:  [facility]  PHYSICIAN:  Doylene Canning. Ladona Ridgel, MD    DATE OF BIRTH:  [birth year]  DATE OF PROCEDURE:  06/14/2012 DATE OF DISCHARGE:                              OPERATIVE REPORT   PROCEDURE PERFORMED:  Insertion of a single-chamber defibrillator.  INDICATIONS:  Ischemic cardiomyopathy, chronic systolic heart failure despite maximal medical therapy, EF 25%.  INTRODUCTION:  The patient is a 56 year old man status post MI with longstanding ischemic cardiomyopathy, status post revascularization.  He is on maximal medical therapy and has had persistent LV dysfunction.  He is now referred for prophylactic ICD implantation.  PROCEDURE:  After informed consent was obtained, the patient was taken to the Diagnostic EP Lab in the fasting state.  After usual preparation and draping, intravenous fentanyl and midazolam was given for sedation. A 30 mL of lidocaine was infiltrated into the left infraclavicular region.  A 7-cm incision was carried out over this region. Electrocautery was utilized to dissect down to the fascial plane.  The left subclavian vein was punctured and the Medtronic model 6935 65-cm active fixation pacing lead, serial number [id number] was advanced into the right ventricle.  Mapping was carried out.  R-waves were reduced throughout.  At the final site, the R-waves measured between 7 and 8 millivolts.  There was large injury current with active fixation lead and the pacing impedance was 500 ohms.  The pacing threshold was 0.5 volts at 0.4 milliseconds.  With these satisfactory parameters, the lead was secured to the subpectoral fascia with a figure-of-eight silk suture.  The sewing sleeve was secured with silk suture.  Electrocautery was utilized to make a subcutaneous pocket.  Antibiotic irrigation was utilized to irrigate the pocket, and  electrocautery was utilized to assure hemostasis.  The Medtronic Evera XT VR single-chamber defibrillator, serial number [id number] was connected to the defibrillation lead and placed back in the subcutaneous pocket.  The pocket was irrigated with antibiotic irrigation.  The incision was closed with 2-0 and 3-0 Vicryl.  Benzoin and Steri-Strips were painted on the skin.  At this point, I scrubbed out of the case to supervise defibrillation threshold testing.  After the patient was more deeply sedated under my direct supervision, ventricular fibrillation was induced with a T-wave shock.  A 20-joule shock was delivered, which terminated ventricular fibrillation and restored sinus rhythm.  Shock impedance was 62 ohms.  At this point, a pressure dressing was placed over the pocket and the patient was returned to the recovery area in satisfactory condition.  COMPLICATIONS:  There were no immediate procedure complications.  RESULTS:  This demonstrates successful implantation of a Medtronic single chamber defibrillator in a patient with a longstanding ischemic cardiomyopathy, chronic systolic heart failure despite maximal medical therapy with carvedilol and lisinopril.     Doylene Canning. Ladona Ridgel, MD     GWT/MEDQ  D:  06/14/2012  T:  06/15/2012  Job:  [id number]  cc:   Gerrit Friends. Dietrich Pates, MD, [facility]

## 2012-06-21 ENCOUNTER — Telehealth: Payer: Self-pay | Admitting: Internal Medicine

## 2012-06-21 ENCOUNTER — Telehealth: Payer: Self-pay | Admitting: *Deleted

## 2012-06-21 NOTE — Telephone Encounter (Signed)
Assessed PPM site and is normal.  Steri strips intact.  Minimal swelling noted, however can feel pacemaker and no sign of hematoma noted.  Advised to keep appt with pacer clinic on the 2nd.

## 2012-06-21 NOTE — Telephone Encounter (Signed)
Has a wound check on the 2nd.  Wife states that site has swollen to about 1 inch deep.  Advised them to come in this afternoon, so we could have eyes on it and make sure site is ok.

## 2012-06-21 NOTE — Telephone Encounter (Signed)
Questions about swelling at site of device placement. / tgs

## 2012-06-21 NOTE — Telephone Encounter (Signed)
Patient to be seen in [facility] by Reather Laurence, RN.

## 2012-06-27 ENCOUNTER — Encounter: Payer: Self-pay | Admitting: Internal Medicine

## 2012-06-27 ENCOUNTER — Ambulatory Visit (INDEPENDENT_AMBULATORY_CARE_PROVIDER_SITE_OTHER): Payer: Medicare Other | Admitting: *Deleted

## 2012-06-27 DIAGNOSIS — I428 Other cardiomyopathies: Secondary | ICD-10-CM

## 2012-06-27 LAB — ICD DEVICE OBSERVATION
HV IMPEDENCE: 71 Ohm
RV LEAD IMPEDENCE ICD: 532 Ohm
RV LEAD THRESHOLD: 0.5 V
VENTRICULAR PACING ICD: 0.1 pct

## 2012-06-27 NOTE — Progress Notes (Signed)
Wound check defib in clinic  

## 2012-07-05 ENCOUNTER — Ambulatory Visit (INDEPENDENT_AMBULATORY_CARE_PROVIDER_SITE_OTHER): Payer: Medicare Other | Admitting: Cardiology

## 2012-07-05 ENCOUNTER — Encounter: Payer: Self-pay | Admitting: *Deleted

## 2012-07-05 ENCOUNTER — Encounter: Payer: Self-pay | Admitting: Cardiology

## 2012-07-05 VITALS — BP 90/58 | HR 68 | Ht 68.0 in | Wt 181.0 lb

## 2012-07-05 DIAGNOSIS — J449 Chronic obstructive pulmonary disease, unspecified: Secondary | ICD-10-CM

## 2012-07-05 DIAGNOSIS — K8020 Calculus of gallbladder without cholecystitis without obstruction: Secondary | ICD-10-CM

## 2012-07-05 DIAGNOSIS — I679 Cerebrovascular disease, unspecified: Secondary | ICD-10-CM

## 2012-07-05 DIAGNOSIS — I2510 Atherosclerotic heart disease of native coronary artery without angina pectoris: Secondary | ICD-10-CM

## 2012-07-05 DIAGNOSIS — I7090 Unspecified atherosclerosis: Secondary | ICD-10-CM

## 2012-07-05 HISTORY — DX: Calculus of gallbladder without cholecystitis without obstruction: K80.20

## 2012-07-05 NOTE — Patient Instructions (Signed)
Your physician recommends that you schedule a follow-up appointment in:  1 - 5 months 2 - You will receive a reminder letter in February to schedule an appt with Dr Ladona Ridgel.    Your physician recommends that you return for lab work in: 1 month and 3 months

## 2012-07-05 NOTE — Progress Notes (Deleted)
Name: Charles Savage    DOB: Nov 14, 1955  Age: 57 y.o.  MR#: [id number]       PCP:  Colette Ribas, MD      Insurance: @PAYORNAME @   CC:   No chief complaint on file.  ICD 06/24/12 Medication List  VS BP 90/58  Pulse 68  Ht 5\' 8"  (1.727 m)  Wt 181 lb (82.101 kg)  BMI 27.52 kg/m2  Weights Current Weight  07/05/12 181 lb (82.101 kg)  06/15/12 178 lb (80.74 kg)  06/15/12 178 lb (80.74 kg)    Blood Pressure  BP Readings from Last 3 Encounters:  07/05/12 90/58  06/15/12 125/72  06/15/12 125/72     Admit date:  (Not on file) Last encounter with RMR:  06/13/2012   Allergy Allergies  Allergen Reactions  . Metoprolol Shortness Of Breath and Nausea And Vomiting  . Peach Flavor Hives and Itching    "peaches; peach flavoring; actually happened after he ate a store-bought peach pie"  . Morphine And Related Hives and Itching    Current Outpatient Prescriptions  Medication Sig Dispense Refill  . albuterol (PROVENTIL HFA;VENTOLIN HFA) 108 (90 BASE) MCG/ACT inhaler Inhale 1-2 puffs into the lungs every 6 (six) hours as needed for wheezing.  1 Inhaler  0  . aspirin 325 MG tablet Take 325 mg by mouth daily.      . carvedilol (COREG) 12.5 MG tablet Take 1 tablet (12.5 mg total) by mouth 2 (two) times daily.  180 tablet  3  . docusate sodium (COLACE) 100 MG capsule Take 100 mg by mouth daily.      Marland Kitchen escitalopram (LEXAPRO) 20 MG tablet Take 20 mg by mouth daily.      . furosemide (LASIX) 20 MG tablet Take 0.5 tablets (10 mg total) by mouth daily.  30 tablet  2  . HYDROcodone-acetaminophen (NORCO) 10-325 MG per tablet Take 1 tablet by mouth every 8 (eight) hours as needed. Pain  21 tablet  0  . lisinopril (PRINIVIL,ZESTRIL) 5 MG tablet Take 1 tablet (5 mg total) by mouth daily.  30 tablet  6  . meloxicam (MOBIC) 15 MG tablet Take 15 mg by mouth daily.      . simvastatin (ZOCOR) 40 MG tablet Take 1 tablet (40 mg total) by mouth at bedtime.  30 tablet  12    Discontinued Meds:  There are no discontinued medications.  Patient Active Problem List  Diagnosis  . Hyperlipemia  . Lumbar disc disease  . Cerebrovascular disease  . Tobacco abuse, in remission  . Arteriosclerotic cardiovascular disease (ASCVD)  . Interstitial lung disease  . COPD (chronic obstructive pulmonary disease)  . Laboratory test  . Chronic systolic heart failure  . S/P ICD (internal cardiac defibrillator) procedure    LABS Clinical Support on 06/27/2012  Component Date Value  . DEVICE MODEL ICD 06/27/2012 [id number]   . DEV-0014ICD 06/27/2012 Lewayne Bunting   M.D.   . Nira Conn 06/27/2012 N   . Sherlon Handing 06/27/2012 Lewayne Bunting   M.D.   . [facility] NOTES ICD 06/27/2012                     Value:Wound check appointment. Steri-strips removed. Wound without redness or edema. Incision edges approximated, wound well healed. Normal device function. Thresholds, sensing, and impedances consistent with implant measurements. Device programmed at 3.5V for  extra safety margin until 3 month visit. Histogram distribution appropriate for patient and level of activity. No mode switches or ventricular arrhythmias noted. Patient educated about wound care, arm mobility, lifting restrictions, shock plan. Pt would                          like to be enrolled in [facility]. ROV in 3 months with GT/RDS.  Marland Kitchen RV LEAD IMPEDENCE ICD 06/27/2012 532   . HV IMPEDENCE 06/27/2012 71   . VENTRICULAR PACING ICD 06/27/2012 0.1   . RV LEAD AMPLITUDE 06/27/2012 10.1   . RV LEAD THRESHOLD 06/27/2012 0.5   . BRDY-0001RV 06/27/2012 VVI   . BRDY-0002RV 06/27/2012 40   . BRDY-0009RV 06/27/2012 No   Admission on 06/14/2012, Discharged on 06/15/2012  Component Date Value  . MRSA, PCR 06/14/2012 NEGATIVE   . Staphylococcus aureus 06/14/2012 NEGATIVE   Orders Only on 06/12/2012  Component Date Value  . WBC 06/12/2012 10.2   . RBC 06/12/2012 4.43   . Hemoglobin 06/12/2012 14.0   . HCT 06/12/2012  41.2   . MCV 06/12/2012 93.0   . [facility] 06/12/2012 31.6   . MCHC 06/12/2012 34.0   . RDW 06/12/2012 14.0   . Platelets 06/12/2012 279   . Prothrombin Time 06/12/2012 13.5   . INR 06/12/2012 1.03   . aPTT 06/12/2012 31   . Sodium 06/12/2012 144   . Potassium 06/12/2012 5.1   . Chloride 06/12/2012 103   . CO2 06/12/2012 27   . Glucose, Bld 06/12/2012 95   . BUN 06/12/2012 13   . Creat 06/12/2012 0.94   . Calcium 06/12/2012 9.4   Orders Only on 05/10/2012  Component Date Value  . Sodium 05/20/2012 139   . Potassium 05/20/2012 5.3   . Chloride 05/20/2012 103   . CO2 05/20/2012 28   . Glucose, Bld 05/20/2012 93   . BUN 05/20/2012 12   . Creat 05/20/2012 0.75   . Calcium 05/20/2012 9.8      Results for this Opt Visit:     Results for orders placed in visit on 06/27/12  ICD DEVICE OBSERVATION      Component Value Range   DEVICE MODEL ICD [id number] H     DEV-0014ICD Lewayne Bunting   M.D.     DEV-0020ICD N     DEV-0014LDO Lewayne Bunting   M.D.     [facility] NOTES ICD       Value: Wound check appointment. Steri-strips removed. Wound without redness or edema. Incision edges approximated, wound well healed. Normal device function. Thresholds, sensing, and impedances consistent with implant measurements. Device programmed at 3.5V for      extra safety margin until 3 month visit. Histogram distribution appropriate for patient and level of activity. No mode switches or ventricular arrhythmias noted. Patient educated about wound care, arm mobility, lifting restrictions, shock plan. Pt would      like to be enrolled in [facility]. ROV in 3 months with GT/RDS.   RV LEAD IMPEDENCE ICD 532     HV IMPEDENCE 71     VENTRICULAR PACING ICD 0.1     RV LEAD AMPLITUDE 10.1     RV LEAD THRESHOLD 0.5     BRDY-0001RV VVI     BRDY-0002RV 40     BRDY-0009RV No      EKG Orders placed during the hospital encounter of 06/14/12  . EKG 12-LEAD  . EKG 12-LEAD  . EKG  Prior Assessment and  Plan Problem List as of 07/05/2012            Cardiology Problems   Hyperlipemia   Last Assessment & Plan Note   04/26/2012 Office Visit Signed 04/26/2012  2:26 PM by Kathlen Brunswick, MD    Excellent control of hyperlipidemia; current medication will be continued.    Cerebrovascular disease   Last Assessment & Plan Note   04/26/2012 Office Visit Signed 04/26/2012  2:31 PM by Kathlen Brunswick, MD    Patient has done well following left carotid endarterectomy.  Annual or biannual monitoring with carotid ultrasound as appropriate.    Arteriosclerotic cardiovascular disease (ASCVD)   Last Assessment & Plan Note   04/26/2012 Office Visit Addendum 04/28/2012 11:56 AM by Kathlen Brunswick, MD    Patient has developed severe left ventricular dysfunction following mitral valve repair and CABG surgery.  This could be due to the decrease in mitral regurgitation or to perioperative left ventricular injury.  In any case, we will optimize medical therapy.  The benefits of an implanted defibrillator were discussed and patient approves referral to Dr. Ladona Ridgel for consideration of an implant.  He is cautioned to weigh himself regularly and to report any substantial increase.  He's advised to report the development of edema, dyspnea, orthopnea or chest discomfort.    Chronic systolic heart failure   Last Assessment & Plan Note   05/27/2012 Office Visit Signed 05/27/2012 12:16 PM by Marinus Maw, MD    The patient has chronic systolic heart failure, class II, with an ejection fraction of 15-20% by echo. I've discussed the risk, benefits, goals, and expectations of ICD implantation for primary prevention. He is considering his options, and will call us if you would like to proceed with prophylactic ICD implantation.      Other   Lumbar disc disease   Tobacco abuse, in remission   Last Assessment & Plan Note   05/27/2012 Office Visit Signed 05/27/2012 12:16 PM by Marinus Maw, MD    Unfortunately, the patient  has returned to smoking. I discussed the importance of smoking cessation with the patient and his family.    Interstitial lung disease   COPD (chronic obstructive pulmonary disease)   Laboratory test   S/P ICD (internal cardiac defibrillator) procedure       Imaging: Dg Chest 2 View  06/15/2012  *RADIOLOGY REPORT*  Clinical Data: Post ICD placement  CHEST - 2 VIEW  Comparison: 11/14/2011; 10/23/2011  Findings:  Grossly unchanged cardiac silhouette and mediastinal contours post median sternotomy, CABG and valve replacement.  Interval placement of a left anterior chest wall single lead pacemaker with tip overlying the expected location of the right ventricular apex.  No definite pneumothorax.  Grossly unchanged chronic right-sided pleural effusion, which appears partially loculated.  Mild pulmonary venous congestion without frank evidence of edema.  No new focal airspace opacities.  Unchanged bones including chronic deformity of the distal end of the right clavicle.  IMPRESSION: 1.  Post left anterior chest wall single lead pacemaker insertion without evidence of complication. 2.  Chronic small partially loculated right sided pleural effusion and right basilar heterogeneous opacities. 3.  Pulmonary congestion without frank evidence of edema.   Original Report Authenticated By: Tacey Ruiz, MD      FRS Calculation: Score not calculated. Missing: Total Cholesterol

## 2012-07-05 NOTE — Assessment & Plan Note (Addendum)
Patient has done extremely well with cardiac surgery, but LV systolic function is worse based upon ejection fraction than it was preoperatively. At present, there are no signs or symptoms to suggest congestive heart failure despite treatment with only low dose diuretic. Blood pressure does not permit an increase in the dose of beta blocker or ACE inhibitor. He asked about activity limitations. I advised him that 7 months after cardiac surgery, no limitations are necessary other than to avoid lifting more than 50 pounds.

## 2012-07-05 NOTE — Assessment & Plan Note (Addendum)
The patient underwent carotid endarterectomy on the left less than one year ago. No bruits are apparent by examination. We will arrange for monitoring ultrasound studies if vascular surgery does not intend to follow.

## 2012-07-05 NOTE — Progress Notes (Signed)
Patient ID: Charles Savage, male   DOB: July 02, 1955, 57 y.o.   MRN: [id number]  HPI: Scheduled return visit for this very nice gentleman with ischemic cardiomyopathy, 7 months status post combined mitral valve repair and CABG surgery, now seen following uncomplicated AICD implant. Since surgery, has done well. Lifestyle is sedentary, but he is asymptomatic from a cardiac standpoint. Weight has been stable with no worse than mild lower extremity edema noted.  Prior to Admission medications   Medication Sig Start Date End Date Taking? Authorizing Provider  albuterol (PROVENTIL HFA;VENTOLIN HFA) 108 (90 BASE) MCG/ACT inhaler Inhale 1-2 puffs into the lungs every 6 (six) hours as needed for wheezing. 09/27/11 09/26/12 Yes Shelda Jakes, MD  aspirin 325 MG tablet Take 325 mg by mouth daily.   Yes Historical Provider, MD  carvedilol (COREG) 12.5 MG tablet Take 1 tablet (12.5 mg total) by mouth 2 (two) times daily. 04/26/12  Yes Kathlen Brunswick, MD  docusate sodium (COLACE) 100 MG capsule Take 100 mg by mouth daily.   Yes Historical Provider, MD  escitalopram (LEXAPRO) 20 MG tablet Take 20 mg by mouth daily.   Yes Historical Provider, MD  furosemide (LASIX) 20 MG tablet Take 0.5 tablets (10 mg total) by mouth daily. 06/15/12  Yes Roger A Arguello, PA-C  HYDROcodone-acetaminophen (NORCO) 10-325 MG per tablet Take 1 tablet by mouth every 8 (eight) hours as needed. Pain 11/03/11  Yes Jodelle Gross, NP  lisinopril (PRINIVIL,ZESTRIL) 5 MG tablet Take 1 tablet (5 mg total) by mouth daily. 04/26/12  Yes Kathlen Brunswick, MD  meloxicam (MOBIC) 15 MG tablet Take 15 mg by mouth daily.   Yes Historical Provider, MD  simvastatin (ZOCOR) 40 MG tablet Take 1 tablet (40 mg total) by mouth at bedtime. 12/12/11 12/11/12 Yes Kathlen Brunswick, MD   Allergies  Allergen Reactions  . Metoprolol Shortness Of Breath and Nausea And Vomiting  . Peach Flavor Hives and Itching    "peaches; peach flavoring; actually happened  after he ate a store-bought peach pie"  . Morphine And Related Hives and Itching  Past medical history, social history, and family history reviewed and updated.  ROS: Denies chest pain, dyspnea, orthopnea, PND, palpitations, lightheadedness or syncope. No erythema surrounding the device; incision is closed without drainage. All other systems reviewed and are negative.  PHYSICAL EXAM: BP 90/58  Pulse 68  Ht 5\' 8"  (1.727 m)  Wt 82.101 kg (181 lb)  BMI 27.52 kg/m2 General-Well developed; no acute distress; AICD generator in the left infraclavicular region; benign incision; no swelling Body habitus-proportionate weight and height Neck-No JVD; no carotid bruits Lungs-clear lung fields; resonant to percussion Cardiovascular-normal PMI; normal S1, increased intensity of S2; no murmur; stable sternum Abdomen-normal bowel sounds; soft and non-tender without masses or organomegaly Musculoskeletal-No deformities, no cyanosis or clubbing Neurologic-Normal cranial nerves; symmetric strength and tone Skin-Warm, no significant lesions Extremities-distal pulses intact; no edema  ASSESSMENT AND PLAN:  [place] Bing, MD 07/05/2012 2:42 PM

## 2012-08-14 ENCOUNTER — Other Ambulatory Visit: Payer: Self-pay | Admitting: *Deleted

## 2012-08-14 ENCOUNTER — Encounter: Payer: Self-pay | Admitting: *Deleted

## 2012-08-14 DIAGNOSIS — I2510 Atherosclerotic heart disease of native coronary artery without angina pectoris: Secondary | ICD-10-CM

## 2012-08-19 LAB — BASIC METABOLIC PANEL
BUN: 17 mg/dL (ref 6–23)
CO2: 28 mEq/L (ref 19–32)
Calcium: 9.2 mg/dL (ref 8.4–10.5)
Glucose, Bld: 196 mg/dL — ABNORMAL HIGH (ref 70–99)
Sodium: 138 mEq/L (ref 135–145)

## 2012-08-21 ENCOUNTER — Encounter: Payer: Self-pay | Admitting: *Deleted

## 2012-09-05 ENCOUNTER — Ambulatory Visit (INDEPENDENT_AMBULATORY_CARE_PROVIDER_SITE_OTHER): Payer: Medicare Other | Admitting: Internal Medicine

## 2012-09-05 ENCOUNTER — Encounter: Payer: Self-pay | Admitting: Internal Medicine

## 2012-09-05 VITALS — BP 139/79 | HR 77 | Ht 68.0 in | Wt 196.0 lb

## 2012-09-05 DIAGNOSIS — I5022 Chronic systolic (congestive) heart failure: Secondary | ICD-10-CM

## 2012-09-05 DIAGNOSIS — Z95810 Presence of automatic (implantable) cardiac defibrillator: Secondary | ICD-10-CM

## 2012-09-05 DIAGNOSIS — I428 Other cardiomyopathies: Secondary | ICD-10-CM

## 2012-09-05 LAB — ICD DEVICE OBSERVATION
BRDY-0002RV: 40 {beats}/min
DEV-0020ICD: NEGATIVE
HV IMPEDENCE: 68 Ohm
RV LEAD AMPLITUDE: 11.8 mv
VENTRICULAR PACING ICD: 1 pct

## 2012-09-05 NOTE — Patient Instructions (Addendum)
[facility] Transmission 12/09/12   Your physician wants you to follow-up in: 12/14. You will receive a reminder letter in the mail two months in advance. If you don't receive a letter, please call our office to schedule the follow-up appointment.    Continue same medication

## 2012-09-05 NOTE — Progress Notes (Signed)
HPI Mr. Aldana returns today for followup. He is a very pleasant 57 year old man with an ischemic cardiomyopathy, chronic systolic heart failure, status post ICD implantation. In the interim, the patient has done well. He denies chest pain, shortness of breath, or syncope. No ICD shocks. Allergies  Allergen Reactions  . Metoprolol Shortness Of Breath and Nausea And Vomiting  . Peach Flavor Hives and Itching    "peaches; peach flavoring; actually happened after he ate a store-bought peach pie"  . Morphine And Related Hives and Itching     Current Outpatient Prescriptions  Medication Sig Dispense Refill  . albuterol (PROVENTIL HFA;VENTOLIN HFA) 108 (90 BASE) MCG/ACT inhaler Inhale 1-2 puffs into the lungs every 6 (six) hours as needed for wheezing.  1 Inhaler  0  . aspirin 325 MG tablet Take 325 mg by mouth daily.      . carvedilol (COREG) 12.5 MG tablet Take 1 tablet (12.5 mg total) by mouth 2 (two) times daily.  180 tablet  3  . docusate sodium (COLACE) 100 MG capsule Take 100 mg by mouth daily.      Marland Kitchen escitalopram (LEXAPRO) 20 MG tablet Take 20 mg by mouth daily.      . furosemide (LASIX) 20 MG tablet Take 0.5 tablets (10 mg total) by mouth daily.  30 tablet  2  . HYDROcodone-acetaminophen (NORCO) 10-325 MG per tablet Take 1 tablet by mouth every 8 (eight) hours as needed. Pain  21 tablet  0  . lisinopril (PRINIVIL,ZESTRIL) 5 MG tablet Take 1 tablet (5 mg total) by mouth daily.  30 tablet  6  . meloxicam (MOBIC) 15 MG tablet Take 15 mg by mouth daily.      . simvastatin (ZOCOR) 40 MG tablet Take 1 tablet (40 mg total) by mouth at bedtime.  30 tablet  12   No current facility-administered medications for this visit.     Past Medical History  Diagnosis Date  . Cerebrovascular disease     h/o CVA in 07/2011 + left carotid endarterectomy  . Hyperlipemia   . Pneumonia   . COPD (chronic obstructive pulmonary disease)     on 2L O2 at home since 08/2011  . Arteriosclerotic cardiovascular  disease (ASCVD)     With ischemic mitral regurgitation and CHF  . ICD (implantable cardiac defibrillator) in place   . Coronary artery disease   . CHF (congestive heart failure)   . Myocardial infarction 08/2011  . Stroke 07/2011    denies residual (06/14/2012)  . Chronic lower back pain   . Arthritis     "back; knees" (06/14/2012)  . DDD (degenerative disc disease), lumbosacral   . Depression 09/2011    "after OHS" (06/14/2012)  . Cholelithiasis 07/05/2012    Asymptomatic; identified on CT scanning of the chest;  . Other primary cardiomyopathies     ROS:   All systems reviewed and negative except as noted in the HPI.   Past Surgical History  Procedure Laterality Date  . Back surgery    . Knee arthroscopy  2002    "right" (06/14/2012)  . Foreign body removal  06/05/2011    Procedure: FOREIGN BODY REMOVAL ADULT;  Surgeon: Rosalio Macadamia;  Location: [facility];  Service: Plastics;  Laterality: Right;  removal foreign body from right side face   . Carotid endarterectomy  07/2011    "left" (06/14/2012)  . Tee without cardioversion  10/17/2011    Procedure: TRANSESOPHAGEAL ECHOCARDIOGRAM (TEE);  Surgeon: Dolores Patty, MD;  Location: [facility];  Service: Cardiovascular;  Laterality: N/A;  . Coronary artery bypass graft  10/20/2011    Procedure: CORONARY ARTERY BYPASS GRAFTING (CABG);  Surgeon: Alleen Borne, MD;  Location: [facility] OR;  Service: Open Heart Surgery;  Laterality: N/A;  CABG x six;  using left internal mammary artery and right leg greater saphenous vein harvested endoscopically  . Mitral valve repair  10/20/2011    Procedure: MITRAL VALVE REPAIR (MVR);  Surgeon: Alleen Borne, MD;  Location: [facility] OR;  Service: Open Heart Surgery;  Laterality: N/A;  . Cardiac defibrillator placement  06/14/2012  . Posterior lumbar fusion  1996  . Lumbar disc surgery  2012  . Cardiac catheterization  09/2011     Family History  Problem Relation Age of Onset  .  Coronary artery disease Mother   . Coronary artery disease Father   . Arrhythmia Brother      History   Social History  . Marital Status: Married    Spouse Name: N/A    Number of Children: N/A  . Years of Education: N/A   Occupational History  . Not on file.   Social History Main Topics  . Smoking status: Current Every Day Smoker -- 1.50 packs/day for 42 years    Types: Cigarettes  . Smokeless tobacco: Never Used     Comment: quit from 09-10-2011 and started back smoking 03-26-2012  . 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 Drug Use: No  . Sexually Active: No     Comment: Quit drinking alcohol 2 yrs ago   Other Topics Concern  . 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     BP 139/79  Pulse 77  Ht 5\' 8"  (1.727 m)  Wt 196 lb (88.905 kg)  BMI 29.81 kg/m2  SpO2 98%  Physical Exam:  Well appearing 57 year old man,NAD HEENT: Unremarkable Neck:  6 cm JVD, no thyromegally Lungs:  Clear with no wheezes, rales, or rhonchi. HEART:  Regular rate rhythm, grade 2/6 systolic murmur, no rubs, no clicks Abd:  soft, positive bowel sounds, no organomegally, no rebound, no guarding Ext:  2 plus pulses, no edema, no cyanosis, no clubbing Skin:  No rashes no nodules Neuro:  CN II through XII intact, motor grossly intact  DEVICE  Normal device function.  See PaceArt for details.   Assess/Plan:

## 2012-09-05 NOTE — Assessment & Plan Note (Signed)
His ICD is working normally. He has a Medtronic device. Estimated longevity is 11 years. No programming changes today.

## 2012-09-05 NOTE — Assessment & Plan Note (Signed)
His chronic systolic heart failure is class II. I've encouraged the patient to reduce his salt intake, and to continue his current medical therapy. He is encouraged to begin exercising.

## 2012-09-09 ENCOUNTER — Encounter: Payer: Self-pay | Admitting: Internal Medicine

## 2012-10-04 ENCOUNTER — Telehealth: Payer: Self-pay | Admitting: Cardiology

## 2012-10-04 NOTE — Telephone Encounter (Signed)
PT STATES THAT HIS DEVICE BEEPED.

## 2012-10-04 NOTE — Telephone Encounter (Signed)
States he thinks the beeping came from his cell phone.  Advised him to call the [place] office if he feels that it is coming from his ICD

## 2012-10-22 ENCOUNTER — Other Ambulatory Visit (HOSPITAL_COMMUNITY): Payer: Self-pay | Admitting: Physician Assistant

## 2012-10-25 ENCOUNTER — Telehealth: Payer: Self-pay | Admitting: Cardiology

## 2012-10-25 MED ORDER — FUROSEMIDE 20 MG PO TABS: ORAL_TABLET | ORAL | Status: DC

## 2012-10-25 NOTE — Telephone Encounter (Signed)
rx sent to pharmacy by e-script  

## 2012-10-25 NOTE — Telephone Encounter (Signed)
Needs Lasix sent to [facility] in [place] / tgs

## 2012-11-25 ENCOUNTER — Encounter: Payer: Self-pay | Admitting: Cardiology

## 2012-11-27 ENCOUNTER — Other Ambulatory Visit: Payer: Self-pay | Admitting: Cardiology

## 2012-11-29 ENCOUNTER — Ambulatory Visit (INDEPENDENT_AMBULATORY_CARE_PROVIDER_SITE_OTHER): Payer: Medicare Other | Admitting: Cardiology

## 2012-11-29 ENCOUNTER — Encounter: Payer: Self-pay | Admitting: Cardiology

## 2012-11-29 VITALS — BP 104/60 | HR 70 | Ht 68.0 in | Wt 196.0 lb

## 2012-11-29 DIAGNOSIS — I10 Essential (primary) hypertension: Secondary | ICD-10-CM

## 2012-11-29 DIAGNOSIS — I7090 Unspecified atherosclerosis: Secondary | ICD-10-CM

## 2012-11-29 DIAGNOSIS — J449 Chronic obstructive pulmonary disease, unspecified: Secondary | ICD-10-CM

## 2012-11-29 DIAGNOSIS — Z9189 Other specified personal risk factors, not elsewhere classified: Secondary | ICD-10-CM

## 2012-11-29 DIAGNOSIS — Z9289 Personal history of other medical treatment: Secondary | ICD-10-CM

## 2012-11-29 DIAGNOSIS — I2510 Atherosclerotic heart disease of native coronary artery without angina pectoris: Secondary | ICD-10-CM

## 2012-11-29 DIAGNOSIS — E785 Hyperlipidemia, unspecified: Secondary | ICD-10-CM

## 2012-11-29 DIAGNOSIS — Z95810 Presence of automatic (implantable) cardiac defibrillator: Secondary | ICD-10-CM

## 2012-11-29 MED ORDER — LISINOPRIL 5 MG PO TABS: 5.0000 mg | ORAL_TABLET | Freq: Every day | ORAL | Status: DC

## 2012-11-29 NOTE — Progress Notes (Signed)
Patient ID: Charles Savage, male   DOB: 11-13-1955, 57 y.o.   MRN: [id number]  HPI: Schedule return visit for continuing assessment and treatment of ischemic heart disease with severely impaired left ventricular systolic function. During the past 5 months, Charles Savage has done well, tolerating light yard work and activities of daily living without difficulty.  He experiences class II dyspnea on exertion and mild orthostatic lightheadedness. He had no chest pain.  Current Outpatient Prescriptions  Medication Sig Dispense Refill  . aspirin 325 MG tablet Take 325 mg by mouth daily.      . carvedilol (COREG) 12.5 MG tablet Take 1 tablet (12.5 mg total) by mouth 2 (two) times daily.  180 tablet  3  . docusate sodium (COLACE) 100 MG capsule Take 100 mg by mouth daily.      Marland Kitchen escitalopram (LEXAPRO) 20 MG tablet Take 20 mg by mouth daily.      . furosemide (LASIX) 20 MG tablet Take one half tablet by mouth daily  30 tablet  2  . HYDROcodone-acetaminophen (NORCO) 10-325 MG per tablet Take 1 tablet by mouth every 8 (eight) hours as needed. Pain  21 tablet  0  . lisinopril (PRINIVIL,ZESTRIL) 5 MG tablet Take 1 tablet (5 mg total) by mouth daily.  30 tablet  6  . meloxicam (MOBIC) 15 MG tablet Take 15 mg by mouth daily.      . simvastatin (ZOCOR) 40 MG tablet Take 1 tablet (40 mg total) by mouth at bedtime.  30 tablet  12   No current facility-administered medications for this visit.   Allergies  Allergen Reactions  . Metoprolol Shortness Of Breath and Nausea And Vomiting  . Peach Flavor Hives and Itching    "peaches; peach flavoring; actually happened after he ate a store-bought peach pie"  . Morphine And Related Hives and Itching     Past medical history, social history, and family history reviewed and updated.  ROS: Denies orthopnea, PND, pedal edema, palpitations, lightheadedness or syncope. Patient has never undergone colonoscopy and is not interested in doing so.  All other systems reviewed and  are negative.  PHYSICAL EXAM: BP 104/60  Pulse 70  Ht 5\' 8"  (1.727 m)  Wt 88.905 kg (196 lb)  BMI 29.81 kg/m2;  Body mass index is 29.81 kg/(m^2). General-Well developed; no acute distress Body habitus-mildly overweight Neck-No JVD; no carotid bruits Lungs-mild expiratory rhonchi; resonant to percussion; mild prolongation of the expiratory phase Cardiovascular-normal PMI; split S1 and normal S2 Abdomen-normal bowel sounds; soft and non-tender without masses or organomegaly Musculoskeletal-No deformities, no cyanosis or clubbing Neurologic-Normal cranial nerves; symmetric strength and tone Skin-Warm, no significant lesions Extremities-distal pulses intact; no edema  EKG: Normal sinus rhythm; left atrial abnormality; T wave abnormality-consider anterolateral ischemia or LVH; no previous tracing for comparison  [place] Bing, MD 11/29/2012  3:56 PM  ASSESSMENT AND PLAN

## 2012-11-29 NOTE — Assessment & Plan Note (Signed)
Device has never discharged and is appropriately monitored by our EP service

## 2012-11-29 NOTE — Progress Notes (Deleted)
Name: Charles Savage    DOB: [birth year]  Age: 57 y.o.  MR#: [id number]       PCP:  Colette Ribas, MD      Insurance: Payor: [employer] [place] MEDICARE / Plan: BLUE MEDICARE / Product Type: *No Product type* /   CC:   No chief complaint on file. NO LIST  VS Filed Vitals:   11/29/12 1504  BP: 104/60  Pulse: 70  Height: 5\' 8"  (1.727 m)  Weight: 196 lb (88.905 kg)    Weights Current Weight  11/29/12 196 lb (88.905 kg)  09/05/12 196 lb (88.905 kg)  07/05/12 181 lb (82.101 kg)    Blood Pressure  BP Readings from Last 3 Encounters:  11/29/12 104/60  09/05/12 139/79  07/05/12 90/58     Admit date:  (Not on file) Last encounter with RMR:  11/27/2012   Allergy Metoprolol; Peach flavor; and Morphine and related  Current Outpatient Prescriptions  Medication Sig Dispense Refill  . aspirin 325 MG tablet Take 325 mg by mouth daily.      . carvedilol (COREG) 12.5 MG tablet Take 1 tablet (12.5 mg total) by mouth 2 (two) times daily.  180 tablet  3  . docusate sodium (COLACE) 100 MG capsule Take 100 mg by mouth daily.      Marland Kitchen escitalopram (LEXAPRO) 20 MG tablet Take 20 mg by mouth daily.      . furosemide (LASIX) 20 MG tablet Take one half tablet by mouth daily  30 tablet  2  . HYDROcodone-acetaminophen (NORCO) 10-325 MG per tablet Take 1 tablet by mouth every 8 (eight) hours as needed. Pain  21 tablet  0  . lisinopril (PRINIVIL,ZESTRIL) 5 MG tablet Take 1 tablet (5 mg total) by mouth daily.  30 tablet  6  . meloxicam (MOBIC) 15 MG tablet Take 15 mg by mouth daily.      . simvastatin (ZOCOR) 40 MG tablet Take 1 tablet (40 mg total) by mouth at bedtime.  30 tablet  12   No current facility-administered medications for this visit.    Discontinued Meds:    Medications Discontinued During This Encounter  Medication Reason  . albuterol (PROVENTIL HFA;VENTOLIN HFA) 108 (90 BASE) MCG/ACT inhaler Patient Preference    Patient Active Problem List   Diagnosis Date Noted  .  Chronic systolic heart failure 09/05/2012  . Other primary cardiomyopathies   . Cholelithiasis 07/05/2012  . S/P ICD (internal cardiac defibrillator) procedure 06/15/2012  . History of diagnostic tests 12/11/2011  . COPD (chronic obstructive pulmonary disease) 09/29/2011  . Arteriosclerotic cardiovascular disease (ASCVD) 09/19/2011  . Tobacco abuse, in remission 09/11/2011  . Hyperlipemia   . Lumbar disc disease   . Cerebrovascular disease     LABS    Component Value Date/Time   NA 138 08/14/2012 1654   NA 144 06/12/2012 1019   NA 139 05/20/2012 0925   K 4.6 08/14/2012 1654   K 5.1 06/12/2012 1019   K 5.3 05/20/2012 0925   CL 103 08/14/2012 1654   CL 103 06/12/2012 1019   CL 103 05/20/2012 0925   CO2 28 08/14/2012 1654   CO2 27 06/12/2012 1019   CO2 28 05/20/2012 0925   GLUCOSE 196* 08/14/2012 1654   GLUCOSE 95 06/12/2012 1019   GLUCOSE 93 05/20/2012 0925   BUN 17 08/14/2012 1654   BUN 13 06/12/2012 1019   BUN 12 05/20/2012 0925   CREATININE 1.01 08/14/2012 1654   CREATININE 0.94 06/12/2012  1019   CREATININE 0.75 05/20/2012 0925   CREATININE 0.95 10/23/2011 0509   CREATININE 0.86 10/22/2011 0400   CREATININE 1.00 10/21/2011 1701   CALCIUM 9.2 08/14/2012 1654   CALCIUM 9.4 06/12/2012 1019   CALCIUM 9.8 05/20/2012 0925   GFRNONAA >90 10/23/2011 0509   GFRNONAA >90 10/22/2011 0400   GFRNONAA 79* 10/21/2011 1700   GFRAA >90 10/23/2011 0509   GFRAA >90 10/22/2011 0400   GFRAA >90 10/21/2011 1700   CMP     Component Value Date/Time   NA 138 08/14/2012 1654   K 4.6 08/14/2012 1654   CL 103 08/14/2012 1654   CO2 28 08/14/2012 1654   GLUCOSE 196* 08/14/2012 1654   BUN 17 08/14/2012 1654   CREATININE 1.01 08/14/2012 1654   CREATININE 0.95 10/23/2011 0509   CALCIUM 9.2 08/14/2012 1654   PROT 7.4 10/10/2011 0500   ALBUMIN 3.5 10/10/2011 0500   AST 47* 10/10/2011 0500   ALT 52 10/10/2011 0500   ALKPHOS 102 10/10/2011 0500   BILITOT 0.8 10/10/2011 0500   GFRNONAA >90 10/23/2011 0509   GFRAA >90  10/23/2011 0509       Component Value Date/Time   WBC 10.2 06/12/2012 1019   WBC 13.4* 11/03/2011 1621   WBC 11.5* 10/23/2011 0509   HGB 14.0 06/12/2012 1019   HGB 10.8* 11/03/2011 1621   HGB 8.1* 10/23/2011 0509   HCT 41.2 06/12/2012 1019   HCT 34.6* 11/03/2011 1621   HCT 24.4* 10/23/2011 0509   MCV 93.0 06/12/2012 1019   MCV 95.3 11/03/2011 1621   MCV 90.0 10/23/2011 0509    Lipid Panel     Component Value Date/Time   CHOL 122 01/12/2012 1445   TRIG 122 01/12/2012 1445   HDL 33* 01/12/2012 1445   CHOLHDL 3.7 01/12/2012 1445   VLDL 24 01/12/2012 1445   LDLCALC 65 01/12/2012 1445    ABG    Component Value Date/Time   PHART 7.359 10/20/2011 2011   PCO2ART 40.8 10/20/2011 2011   PO2ART 124.0* 10/20/2011 2011   HCO3 23.1 10/20/2011 2011   TCO2 24 10/21/2011 1701   ACIDBASEDEF 2.0 10/20/2011 2011   O2SAT 99.0 10/20/2011 2011     Lab Results  Component Value Date   TSH 2.646 10/10/2011   BNP (last 3 results) No results found for this basename: PROBNP,  in the last 8760 hours Cardiac Panel (last 3 results) No results found for this basename: CKTOTAL, CKMB, TROPONINI, RELINDX,  in the last 72 hours  Iron/TIBC/Ferritin No results found for this basename: iron, tibc, ferritin     EKG Orders placed in visit on 11/29/12  . EKG 12-LEAD     Prior Assessment and Plan Problem List as of 11/29/2012   Hyperlipemia   Last Assessment & Plan   04/26/2012 Office Visit Written 04/26/2012  2:26 PM by Kathlen Brunswick, MD     Excellent control of hyperlipidemia; current medication will be continued.    Lumbar disc disease   Cerebrovascular disease   Last Assessment & Plan   07/05/2012 Office Visit Edited 07/07/2012 10:12 PM by Kathlen Brunswick, MD     The patient underwent carotid endarterectomy on the left less than one year ago. No bruits are apparent by examination. We will arrange for monitoring ultrasound studies if vascular surgery does not intend to follow.    Tobacco abuse, in remission    Last Assessment & Plan   05/27/2012 Office Visit Written 05/27/2012 12:16 PM by Marinus Maw, MD  Unfortunately, the patient has returned to smoking. I discussed the importance of smoking cessation with the patient and his family.    Arteriosclerotic cardiovascular disease (ASCVD)   Last Assessment & Plan   07/05/2012 Office Visit Edited 07/07/2012 10:12 PM by Kathlen Brunswick, MD     Patient has done extremely well with cardiac surgery, but LV systolic function is worse based upon ejection fraction than it was preoperatively. At present, there are no signs or symptoms to suggest congestive heart failure despite treatment with only low dose diuretic. Blood pressure does not permit an increase in the dose of beta blocker or ACE inhibitor. He asked about activity limitations. I advised him that 7 months after cardiac surgery, no limitations are necessary other than to avoid lifting more than 50 pounds.    COPD (chronic obstructive pulmonary disease)   History of diagnostic tests   S/P ICD (internal cardiac defibrillator) procedure   Last Assessment & Plan   09/05/2012 Office Visit Written 09/05/2012 12:14 PM by Marinus Maw, MD     His ICD is working normally. He has a Medtronic device. Estimated longevity is 11 years. No programming changes today.    Cholelithiasis   Other primary cardiomyopathies   Chronic systolic heart failure   Last Assessment & Plan   09/05/2012 Office Visit Written 09/05/2012 12:14 PM by Marinus Maw, MD     His chronic systolic heart failure is class II. I've encouraged the patient to reduce his salt intake, and to continue his current medical therapy. He is encouraged to begin exercising.        Imaging: No results found.

## 2012-11-29 NOTE — Assessment & Plan Note (Signed)
Lipid profile is quite good, but not optimal. Atorvastatin 40 mg per day will be substituted for simvastatin with reassessment in a few months.

## 2012-11-29 NOTE — Patient Instructions (Addendum)
Your physician recommends that you schedule a follow-up appointment in: 7 MONTHS  Your physician has recommended you make the following change in your medication:   1) DECREASE ASPIRIN TO 81MG  ONCE DAILY  Your physician discussed the hazards of tobacco use. Tobacco use cessation is recommended and techniques and options to help you quit were discussed.

## 2012-11-29 NOTE — Assessment & Plan Note (Addendum)
Patient has done extremely well over the past 5 months; accordingly, he will continue his present regime. Aspirin dose will be decreased to 81 mg per day.

## 2012-11-29 NOTE — Assessment & Plan Note (Signed)
Patient is strongly encouraged to discontinue cigarette smoking. He stopped for a number of months at the time of CABG with the assistance of transdermal nicotine. He will resume use of that aid and resume attempts to quit.  He will call if this is unsuccessful.

## 2012-12-04 ENCOUNTER — Telehealth: Payer: Self-pay | Admitting: *Deleted

## 2012-12-04 ENCOUNTER — Other Ambulatory Visit: Payer: Self-pay | Admitting: *Deleted

## 2012-12-04 DIAGNOSIS — E782 Mixed hyperlipidemia: Secondary | ICD-10-CM

## 2012-12-04 DIAGNOSIS — I10 Essential (primary) hypertension: Secondary | ICD-10-CM

## 2012-12-04 MED ORDER — ATORVASTATIN CALCIUM 40 MG PO TABS: 40.0000 mg | ORAL_TABLET | Freq: Every day | ORAL | Status: DC

## 2012-12-04 NOTE — Telephone Encounter (Signed)
Called pt to advise Dr RR made some additional changes to his recent OV, left vm to call office back to advise the following:  Change simvastatin to atorvastin 40mg  once daily Start a low potassium diet Labs to be drawn in 2 months (Your physician recommends that you have follow up lab work, we will mail you a reminder letter to alert you when to go [employer], located across the street from our office.) for FLP, BMET Patient orders and reminders have been placed in the patients chart to be released for future reference

## 2012-12-04 NOTE — Progress Notes (Signed)
Medication sent via escribe.  

## 2012-12-04 NOTE — Telephone Encounter (Signed)
Called patient and made him aware of medication change. Sent to pharmacy. Patient verbalized understanding.

## 2012-12-09 ENCOUNTER — Encounter: Payer: Medicare Other | Admitting: *Deleted

## 2012-12-20 ENCOUNTER — Encounter: Payer: Self-pay | Admitting: *Deleted

## 2012-12-26 ENCOUNTER — Other Ambulatory Visit: Payer: Self-pay | Admitting: Cardiology

## 2012-12-30 ENCOUNTER — Ambulatory Visit (INDEPENDENT_AMBULATORY_CARE_PROVIDER_SITE_OTHER): Payer: Medicare Other | Admitting: *Deleted

## 2012-12-30 DIAGNOSIS — I5022 Chronic systolic (congestive) heart failure: Secondary | ICD-10-CM

## 2012-12-30 DIAGNOSIS — I428 Other cardiomyopathies: Secondary | ICD-10-CM

## 2012-12-30 DIAGNOSIS — Z95810 Presence of automatic (implantable) cardiac defibrillator: Secondary | ICD-10-CM

## 2013-01-02 ENCOUNTER — Telehealth: Payer: Self-pay | Admitting: Cardiology

## 2013-01-02 DIAGNOSIS — E785 Hyperlipidemia, unspecified: Secondary | ICD-10-CM

## 2013-01-02 NOTE — Telephone Encounter (Signed)
Please advise 

## 2013-01-02 NOTE — Telephone Encounter (Signed)
Patient states that since switching to Lipitor he has been experiencing some "pains" in his joints that he has not had before. / tgs

## 2013-01-03 NOTE — Telephone Encounter (Signed)
Have him stop the lipitor for one week. If pain goes away, we will know it is the medication and we will switch to something else. Follow up phone call on this pt to check on him in one week.

## 2013-01-03 NOTE — Telephone Encounter (Signed)
Please advise in absence of RR per pt called back.

## 2013-01-03 NOTE — Telephone Encounter (Signed)
Called pt advised him to stop lipitor for one week per KL,NP.  Will follow up phone call in a week to check on pt. Pt understood.

## 2013-01-08 LAB — REMOTE ICD DEVICE
DEV-0020ICD: NEGATIVE
FVT: 0
PACEART VT: 0
TOT-0001: 1
TOT-0002: 0
TOT-0006: 20131220000000
TZAT-0002SLOWVT: NEGATIVE
TZAT-0018FASTVT: NEGATIVE
TZAT-0019FASTVT: 8 V
TZAT-0020FASTVT: 1.5 ms
TZAT-0020SLOWVT: 1.5 ms
TZON-0003SLOWVT: 360 ms
TZON-0004VSLOWVT: 28
TZON-0005SLOWVT: 12
TZST-0001FASTVT: 2
TZST-0001FASTVT: 3
TZST-0001FASTVT: 6
TZST-0001SLOWVT: 3
TZST-0001SLOWVT: 5
TZST-0002FASTVT: NEGATIVE
TZST-0002FASTVT: NEGATIVE
TZST-0002FASTVT: NEGATIVE
TZST-0002SLOWVT: NEGATIVE
TZST-0002SLOWVT: NEGATIVE

## 2013-01-10 NOTE — Telephone Encounter (Signed)
Called pt with no answer. Left a message to call the office back.

## 2013-01-13 NOTE — Telephone Encounter (Signed)
Called pt back in regaurds to pt leg pain. Pt states there is no more pain and that he would like to be off this medication. Please advise

## 2013-01-14 ENCOUNTER — Telehealth: Payer: Self-pay | Admitting: Cardiology

## 2013-01-14 NOTE — Telephone Encounter (Signed)
Patient states that he is no longer having cramps since being taken off of Lipitor.   / tgs

## 2013-01-16 NOTE — Telephone Encounter (Signed)
Discontinue atorvastatin. Resume simvastatin 40 mg per day.

## 2013-01-17 ENCOUNTER — Other Ambulatory Visit: Payer: Self-pay | Admitting: *Deleted

## 2013-01-17 MED ORDER — SIMVASTATIN 40 MG PO TABS: 40.0000 mg | ORAL_TABLET | Freq: Every day | ORAL | Status: DC

## 2013-01-17 NOTE — Telephone Encounter (Signed)
Spoke to patient concerning medication instructions from provider. Patient understood.     

## 2013-01-28 ENCOUNTER — Encounter: Payer: Self-pay | Admitting: *Deleted

## 2013-01-28 ENCOUNTER — Other Ambulatory Visit: Payer: Self-pay | Admitting: *Deleted

## 2013-01-28 DIAGNOSIS — E782 Mixed hyperlipidemia: Secondary | ICD-10-CM

## 2013-01-28 DIAGNOSIS — I10 Essential (primary) hypertension: Secondary | ICD-10-CM

## 2013-02-14 ENCOUNTER — Encounter: Payer: Self-pay | Admitting: *Deleted

## 2013-02-17 LAB — BASIC METABOLIC PANEL
BUN: 14 mg/dL (ref 6–23)
CO2: 29 mEq/L (ref 19–32)
Chloride: 104 mEq/L (ref 96–112)
Glucose, Bld: 98 mg/dL (ref 70–99)
Potassium: 5.7 mEq/L — ABNORMAL HIGH (ref 3.5–5.3)
Sodium: 139 mEq/L (ref 135–145)

## 2013-02-17 LAB — LIPID PANEL
Cholesterol: 138 mg/dL (ref 0–200)
Total CHOL/HDL Ratio: 4.9 Ratio

## 2013-02-18 ENCOUNTER — Encounter: Payer: Self-pay | Admitting: Cardiology

## 2013-02-18 ENCOUNTER — Encounter: Payer: Self-pay | Admitting: *Deleted

## 2013-03-31 ENCOUNTER — Encounter: Payer: Self-pay | Admitting: Internal Medicine

## 2013-03-31 ENCOUNTER — Other Ambulatory Visit: Payer: Self-pay | Admitting: *Deleted

## 2013-03-31 MED ORDER — CARVEDILOL 12.5 MG PO TABS: 12.5000 mg | ORAL_TABLET | Freq: Two times a day (BID) | ORAL | Status: DC

## 2013-04-01 ENCOUNTER — Other Ambulatory Visit: Payer: Self-pay

## 2013-04-01 MED ORDER — FUROSEMIDE 20 MG PO TABS: ORAL_TABLET | ORAL | Status: DC

## 2013-04-07 ENCOUNTER — Encounter: Payer: Medicare Other | Admitting: *Deleted

## 2013-04-14 ENCOUNTER — Telehealth: Payer: Self-pay

## 2013-04-14 MED ORDER — CARVEDILOL 12.5 MG PO TABS: 12.5000 mg | ORAL_TABLET | Freq: Two times a day (BID) | ORAL | Status: DC

## 2013-04-14 NOTE — Telephone Encounter (Signed)
Medication sent via escribe.  

## 2013-04-14 NOTE — Telephone Encounter (Signed)
Received fax refill request  Rx #   Medication:  Carvedilol 12.5 mg Qty   Sig:  1 BID Physician:  Wyline Mood

## 2013-04-21 ENCOUNTER — Encounter: Payer: Self-pay | Admitting: *Deleted

## 2013-05-15 ENCOUNTER — Emergency Department (HOSPITAL_COMMUNITY): Payer: Medicare Other

## 2013-05-15 ENCOUNTER — Emergency Department (HOSPITAL_COMMUNITY)
Admission: EM | Admit: 2013-05-15 | Discharge: 2013-05-15 | Disposition: A | Discharge status: Home / Self Care | Payer: Medicare Other | Attending: Emergency Medicine | Admitting: Emergency Medicine

## 2013-05-15 ENCOUNTER — Encounter (HOSPITAL_COMMUNITY): Payer: Self-pay | Admitting: Emergency Medicine

## 2013-05-15 DIAGNOSIS — Z954 Presence of other heart-valve replacement: Secondary | ICD-10-CM | POA: Insufficient documentation

## 2013-05-15 DIAGNOSIS — Z951 Presence of aortocoronary bypass graft: Secondary | ICD-10-CM | POA: Insufficient documentation

## 2013-05-15 DIAGNOSIS — I2510 Atherosclerotic heart disease of native coronary artery without angina pectoris: Secondary | ICD-10-CM | POA: Insufficient documentation

## 2013-05-15 DIAGNOSIS — I252 Old myocardial infarction: Secondary | ICD-10-CM | POA: Insufficient documentation

## 2013-05-15 DIAGNOSIS — G8929 Other chronic pain: Secondary | ICD-10-CM | POA: Insufficient documentation

## 2013-05-15 DIAGNOSIS — Z791 Long term (current) use of non-steroidal anti-inflammatories (NSAID): Secondary | ICD-10-CM | POA: Insufficient documentation

## 2013-05-15 DIAGNOSIS — M1710 Unilateral primary osteoarthritis, unspecified knee: Secondary | ICD-10-CM | POA: Insufficient documentation

## 2013-05-15 DIAGNOSIS — Z8719 Personal history of other diseases of the digestive system: Secondary | ICD-10-CM | POA: Insufficient documentation

## 2013-05-15 DIAGNOSIS — Z7982 Long term (current) use of aspirin: Secondary | ICD-10-CM | POA: Insufficient documentation

## 2013-05-15 DIAGNOSIS — L272 Dermatitis due to ingested food: Secondary | ICD-10-CM | POA: Insufficient documentation

## 2013-05-15 DIAGNOSIS — Z8701 Personal history of pneumonia (recurrent): Secondary | ICD-10-CM | POA: Insufficient documentation

## 2013-05-15 DIAGNOSIS — I509 Heart failure, unspecified: Secondary | ICD-10-CM | POA: Insufficient documentation

## 2013-05-15 DIAGNOSIS — R0600 Dyspnea, unspecified: Secondary | ICD-10-CM

## 2013-05-15 DIAGNOSIS — Z95818 Presence of other cardiac implants and grafts: Secondary | ICD-10-CM | POA: Insufficient documentation

## 2013-05-15 DIAGNOSIS — E785 Hyperlipidemia, unspecified: Secondary | ICD-10-CM | POA: Insufficient documentation

## 2013-05-15 DIAGNOSIS — M129 Arthropathy, unspecified: Secondary | ICD-10-CM | POA: Insufficient documentation

## 2013-05-15 DIAGNOSIS — J4489 Other specified chronic obstructive pulmonary disease: Secondary | ICD-10-CM | POA: Insufficient documentation

## 2013-05-15 DIAGNOSIS — Z8673 Personal history of transient ischemic attack (TIA), and cerebral infarction without residual deficits: Secondary | ICD-10-CM | POA: Insufficient documentation

## 2013-05-15 DIAGNOSIS — Z8659 Personal history of other mental and behavioral disorders: Secondary | ICD-10-CM | POA: Insufficient documentation

## 2013-05-15 DIAGNOSIS — T7840XA Allergy, unspecified, initial encounter: Secondary | ICD-10-CM

## 2013-05-15 DIAGNOSIS — Z8679 Personal history of other diseases of the circulatory system: Secondary | ICD-10-CM

## 2013-05-15 DIAGNOSIS — Z95810 Presence of automatic (implantable) cardiac defibrillator: Secondary | ICD-10-CM | POA: Insufficient documentation

## 2013-05-15 DIAGNOSIS — F17200 Nicotine dependence, unspecified, uncomplicated: Secondary | ICD-10-CM | POA: Insufficient documentation

## 2013-05-15 DIAGNOSIS — Z8709 Personal history of other diseases of the respiratory system: Secondary | ICD-10-CM

## 2013-05-15 DIAGNOSIS — J449 Chronic obstructive pulmonary disease, unspecified: Secondary | ICD-10-CM | POA: Insufficient documentation

## 2013-05-15 DIAGNOSIS — Z79899 Other long term (current) drug therapy: Secondary | ICD-10-CM | POA: Insufficient documentation

## 2013-05-15 LAB — COMPREHENSIVE METABOLIC PANEL
AST: 21 U/L (ref 0–37)
BUN: 20 mg/dL (ref 6–23)
CO2: 26 mEq/L (ref 19–32)
Calcium: 9 mg/dL (ref 8.4–10.5)
Creatinine, Ser: 1.75 mg/dL — ABNORMAL HIGH (ref 0.50–1.35)
GFR calc Af Amer: 48 mL/min — ABNORMAL LOW (ref >90)
GFR calc non Af Amer: 41 mL/min — ABNORMAL LOW (ref >90)
Glucose, Bld: 219 mg/dL — ABNORMAL HIGH (ref 70–99)
Sodium: 132 mEq/L — ABNORMAL LOW (ref 135–145)
Total Bilirubin: 0.3 mg/dL (ref 0.3–1.2)
Total Protein: 7.7 g/dL (ref 6.0–8.3)

## 2013-05-15 LAB — CBC WITH DIFFERENTIAL/PLATELET
Basophils Absolute: 0 10*3/uL (ref 0.0–0.1)
Basophils Relative: 0 % (ref 0–1)
Eosinophils Absolute: 0.2 10*3/uL (ref 0.0–0.7)
Hemoglobin: 16 g/dL (ref 13.0–17.0)
MCH: 32.5 pg (ref 26.0–34.0)
MCHC: 35.6 g/dL (ref 30.0–36.0)
Monocytes Absolute: 0.7 10*3/uL (ref 0.1–1.0)
Neutro Abs: 11.3 10*3/uL — ABNORMAL HIGH (ref 1.7–7.7)
Neutrophils Relative %: 66 % (ref 43–77)
RDW: 13 % (ref 11.5–15.5)
Smear Review: ADEQUATE
WBC Morphology: INCREASED

## 2013-05-15 LAB — PRO B NATRIURETIC PEPTIDE: Pro B Natriuretic peptide (BNP): 1092 pg/mL — ABNORMAL HIGH (ref 0–125)

## 2013-05-15 LAB — TROPONIN I: Troponin I: <0.3 ng/mL (ref <0.30)

## 2013-05-15 MED ORDER — METHYLPREDNISOLONE SODIUM SUCC 125 MG IJ SOLR
125.0000 mg | Freq: Once | INTRAMUSCULAR | Status: AC
  Administered 2013-05-15: 125 mg via INTRAVENOUS
  Filled 2013-05-15: qty 2

## 2013-05-15 MED ORDER — IPRATROPIUM BROMIDE 0.02 % IN SOLN
0.5000 mg | Freq: Once | RESPIRATORY_TRACT | Status: AC
  Administered 2013-05-15: 0.5 mg via RESPIRATORY_TRACT
  Filled 2013-05-15: qty 2.5

## 2013-05-15 MED ORDER — ALBUTEROL SULFATE (5 MG/ML) 0.5% IN NEBU
5.0000 mg | INHALATION_SOLUTION | Freq: Once | RESPIRATORY_TRACT | Status: AC
  Administered 2013-05-15: 5 mg via RESPIRATORY_TRACT
  Filled 2013-05-15: qty 1

## 2013-05-15 MED ORDER — DIPHENHYDRAMINE HCL 50 MG/ML IJ SOLN
25.0000 mg | Freq: Once | INTRAMUSCULAR | Status: AC
  Administered 2013-05-15: 25 mg via INTRAVENOUS
  Filled 2013-05-15: qty 1

## 2013-05-15 MED ORDER — SODIUM CHLORIDE 0.9 % IV BOLUS (SEPSIS)
500.0000 mL | Freq: Once | INTRAVENOUS | Status: AC
  Administered 2013-05-15: 500 mL via INTRAVENOUS

## 2013-05-15 MED ORDER — PREDNISONE 10 MG PO TABS: 20.0000 mg | ORAL_TABLET | Freq: Two times a day (BID) | ORAL | Status: DC

## 2013-05-15 NOTE — ED Provider Notes (Signed)
CSN: [id number]     Arrival date & time 05/15/13  0003 History   First MD Initiated Contact with Patient 05/15/13 0018     Chief Complaint  Patient presents with  . Allergic Reaction   (Consider location/radiation/quality/duration/timing/severity/associated sxs/prior Treatment) HPI Comments: Patient is a 57 year old male with extensive past medical history including coronary artery disease with bypass graft, COPD, hypertension. He presents to the emergency department with a one-hour history of shortness of breath and itching all over. He feels as though he is having an allergic reaction states this happened once before after he some peaches. He states that he ate some coconut this evening prior to the onset of the symptoms. He denies productive cough, chest pain, fever.  Patient is a 57 y.o. male presenting with allergic reaction. The history is provided by the patient.  Allergic Reaction Presenting symptoms: difficulty breathing, itching and rash   Severity:  Moderate Prior allergic episodes:  No prior episodes Context comment:  Unknown Relieved by:  Nothing Worsened by:  Nothing tried Ineffective treatments:  None tried   Past Medical History  Diagnosis Date  . Cerebrovascular disease     h/o CVA in 07/2011 + left carotid endarterectomy  . Hyperlipemia   . Pneumonia   . COPD (chronic obstructive pulmonary disease)     on 2L O2 at home since 08/2011  . 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 ICD (implantable cardiac defibrillator) in place   . Coronary artery disease   . CHF (congestive heart failure)   . Myocardial infarction 08/2011  . Stroke 07/2011    denies residual (06/14/2012)  . Chronic lower back pain   . Arthritis     "back; knees" (06/14/2012)  . DDD (degenerative disc disease), lumbosacral   . Depression 09/2011    "after OHS" (06/14/2012)  . Cholelithiasis 07/05/2012    Asymptomatic; identified on CT  scanning of the chest;  . Other primary cardiomyopathies    Past Surgical History  Procedure Laterality Date  . Back surgery    . Knee arthroscopy  2002    "right" (06/14/2012)  . Foreign body removal  06/05/2011    Procedure: FOREIGN BODY REMOVAL ADULT;  Surgeon: Rosalio Macadamia;  Location: [facility];  Service: Plastics;  Laterality: Right;  removal foreign body from right side face   . Carotid endarterectomy  07/2011    "left" (06/14/2012)  . Tee without cardioversion  10/17/2011    Procedure: TRANSESOPHAGEAL ECHOCARDIOGRAM (TEE);  Surgeon: Dolores Patty, MD;  Location[facility] ENDOSCOPY;  Service: Cardiovascular;  Laterality: N/A;  . Coronary artery bypass graft  10/20/2011    Procedure: CORONARY ARTERY BYPASS GRAFTING (CABG);  Surgeon: Alleen Borne, MD;  Location: [facility] OR;  Service: Open Heart Surgery;  Laterality: N/A;  CABG x six;  using left internal mammary artery and right leg greater saphenous vein harvested endoscopically  . Mitral valve repair  10/20/2011    Procedure: MITRAL VALVE REPAIR (MVR);  Surgeon: Alleen Borne, MD;  Location: [facility] OR;  Service: Open Heart Surgery;  Laterality: N/A;  . Cardiac defibrillator placement  06/14/2012  . Posterior lumbar fusion  1996  . Lumbar disc surgery  2012  . Cardiac catheterization  09/2011  . Colonoscopy  Never   Family History  Problem Relation Age of Onset  . Coronary artery disease Mother   . Coronary artery disease Father   . Arrhythmia Brother  History  Substance Use Topics  . Smoking status: Current Every Day Smoker -- 1.50 packs/day for 42 years    Types: Cigarettes  . Smokeless tobacco: Never Used     Comment: quit from 09-10-2011 and started back smoking 03-26-2012  . 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  All other systems reviewed and are negative.    Allergies  Metoprolol; Peach  flavor; and Morphine and related  Home Medications   Current Outpatient Rx  Name  Route  Sig  Dispense  Refill  . aspirin 325 MG tablet   Oral   Take 325 mg by mouth daily.         . carvedilol (COREG) 12.5 MG tablet   Oral   Take 1 tablet (12.5 mg total) by mouth 2 (two) times daily.   180 tablet   3   . docusate sodium (COLACE) 100 MG capsule   Oral   Take 100 mg by mouth daily.         Marland Kitchen escitalopram (LEXAPRO) 20 MG tablet   Oral   Take 20 mg by mouth daily.         . furosemide (LASIX) 20 MG tablet      Take one half tablet by mouth daily   30 tablet   2   . HYDROcodone-acetaminophen (NORCO) 10-325 MG per tablet   Oral   Take 1 tablet by mouth every 8 (eight) hours as needed. Pain   21 tablet   0   . lisinopril (PRINIVIL,ZESTRIL) 5 MG tablet   Oral   Take 1 tablet (5 mg total) by mouth daily.   30 tablet   6   . meloxicam (MOBIC) 15 MG tablet   Oral   Take 15 mg by mouth daily.         . simvastatin (ZOCOR) 40 MG tablet   Oral   Take 1 tablet (40 mg total) by mouth at bedtime.   30 tablet   6    BP 94/70  Pulse 75  Resp 24  Ht 5\' 11"  (1.803 m)  Wt 210 lb (95.255 kg)  BMI 29.30 kg/m2  SpO2 82% Physical Exam  Nursing note and vitals reviewed. Constitutional: He is oriented to person, place, and time. He appears well-developed and well-nourished. No distress.  HENT:  Head: Normocephalic.  Mouth/Throat: Oropharynx is clear and moist.  Neck: Normal range of motion. Neck supple.  Cardiovascular: Normal rate, regular rhythm and normal heart sounds.   No murmur heard. Pulmonary/Chest: Effort normal. No respiratory distress. He has no wheezes. He has rales.  There are very slight rales in the bases bilaterally.  Abdominal: Soft. Bowel sounds are normal. He exhibits no distension. There is no tenderness.  Musculoskeletal: Normal range of motion. He exhibits no edema.  Lymphadenopathy:    He has no cervical adenopathy.  Neurological: He is  alert and oriented to person, place, and time.  Skin: He is not diaphoretic.  There is a generalized flushing and pruritus noted, but no obvious urticarial lesions.    ED Course  Procedures (including critical care time) Labs Review Labs Reviewed  CBC WITH DIFFERENTIAL  COMPREHENSIVE METABOLIC PANEL  PRO B NATRIURETIC PEPTIDE  TROPONIN I   Imaging Review No results found.  EKG Interpretation    Date/Time:  Thursday May 15 2013 00:15:23 EST Ventricular  Rate:  72 PR Interval:  162 QRS Duration: 72 QT Interval:  430 QTC Calculation: 470 R Axis:   70 Text Interpretation:  Sinus rhythm with occasional Premature ventricular complexes Nonspecific ST and T wave abnormality Abnormal ECG When compared with ECG of 21-Oct-2011 07:46, Premature ventricular complexes are now Present Premature atrial complexes are no longer Present Vent. rate has decreased BY  40 BPM T wave inversion no longer evident in Inferior leads Confirmed by DELOS  MD, Berkley Cronkright ([id number]) on 05/15/2013 12:42:42 AM            MDM  Patient is a 57 year old male with history of coronary artery disease status post bypass graft 2-3 years ago. He presents with complaints of generalized itching and feeling short of breath. This started shortly after eating coconut. He had a similar reaction when he ate a peach in the past. When he first arrived, he was somewhat hypoxic and was given a nebulizer treatment as well as Solu-Medrol and Benadryl. He was observed for approximately 2 hours and reevaluated. At that point his dyspnea had resolved and he is now saturating 98-99% off of oxygen. His workup reveals an unchanged EKG and negative troponin. The chest x-ray does not reveal heart failure and BNP is at his baseline. His blood pressure was initially somewhat low, however improved with a half liter of normal saline. Patient is now resting comfortably states he feels much better. At this point I feel as though he is stable and we'll  treat with prednisone and Benadryl for presumed allergic reaction. I see nothing in the workup indicates an acute cardiac event and do not feel as though further workup is indicated into this at this time. He understands to return if his symptoms worsen or change or if he develops any new or bothersome symptoms.    Geoffery Lyons, MD 05/15/13 [phone]

## 2013-05-15 NOTE — ED Notes (Signed)
Removed patient from O2. Patient maintaining O2 saturation at 94% on room air.

## 2013-05-15 NOTE — ED Notes (Signed)
Patient complaining of generalized itching and shortness of breath starting approximately 1 hour ago. Patient states "I feel like I am having an allergic reaction. It happened before when I ate peaches."

## 2013-05-15 NOTE — ED Notes (Signed)
[name] O2 saturation was 82% on room air. Placed patient on 2L via nasal canula. O2 increased to 84%. Increased O2 to 4L via nasal canula. [name] O2 saturation increased to 95%.

## 2013-05-15 NOTE — ED Notes (Signed)
Patient has generalized rash. Complaining of itching.

## 2013-06-02 ENCOUNTER — Ambulatory Visit (INDEPENDENT_AMBULATORY_CARE_PROVIDER_SITE_OTHER): Payer: Medicare Other | Admitting: Internal Medicine

## 2013-06-02 ENCOUNTER — Other Ambulatory Visit: Payer: Self-pay

## 2013-06-02 ENCOUNTER — Encounter: Payer: Self-pay | Admitting: Internal Medicine

## 2013-06-02 VITALS — BP 122/63 | HR 89 | Ht 71.0 in | Wt 201.0 lb

## 2013-06-02 DIAGNOSIS — I428 Other cardiomyopathies: Secondary | ICD-10-CM

## 2013-06-02 DIAGNOSIS — I5022 Chronic systolic (congestive) heart failure: Secondary | ICD-10-CM | POA: Insufficient documentation

## 2013-06-02 DIAGNOSIS — Z95810 Presence of automatic (implantable) cardiac defibrillator: Secondary | ICD-10-CM

## 2013-06-02 LAB — MDC_IDC_ENUM_SESS_TYPE_INCLINIC
Battery Remaining Longevity: 133 mo
Battery Voltage: 3.06 V
HighPow Impedance: 228 Ohm
Lead Channel Impedance Value: 589 Ohm
Lead Channel Pacing Threshold Pulse Width: 0.4 ms
Lead Channel Sensing Intrinsic Amplitude: 16.875 mV
Lead Channel Setting Pacing Amplitude: 2 V
Lead Channel Setting Pacing Pulse Width: 0.4 ms
Zone Setting Detection Interval: 360 ms

## 2013-06-02 MED ORDER — FUROSEMIDE 20 MG PO TABS: ORAL_TABLET | ORAL | Status: DC

## 2013-06-02 NOTE — Assessment & Plan Note (Signed)
His symptoms are class II and well compensated. He'll continue his current medical therapy.

## 2013-06-02 NOTE — Progress Notes (Signed)
HPI Charles Savage returns today for followup. He is a very pleasant 57 year old man with an ischemic cardiomyopathy, chronic systolic heart failure, status post ICD implantation. In the interim, the patient has done well. He denies chest pain, shortness of breath, or syncope. No ICD shocks. Allergies  Allergen Reactions  . Metoprolol Shortness Of Breath and Nausea And Vomiting  . Peach Flavor Hives and Itching    "peaches; peach flavoring; actually happened after he ate a store-bought peach pie"  . Morphine And Related Hives and Itching     Current Outpatient Prescriptions  Medication Sig Dispense Refill  . aspirin 325 MG tablet Take 325 mg by mouth daily.      . carvedilol (COREG) 12.5 MG tablet Take 1 tablet (12.5 mg total) by mouth 2 (two) times daily.  180 tablet  3  . docusate sodium (COLACE) 100 MG capsule Take 100 mg by mouth daily.      Marland Kitchen EPIPEN 2-PAK 0.3 MG/0.3ML SOAJ injection       . escitalopram (LEXAPRO) 20 MG tablet Take 20 mg by mouth daily.      . furosemide (LASIX) 20 MG tablet Take one half tablet by mouth daily  30 tablet  2  . HYDROcodone-acetaminophen (NORCO) 10-325 MG per tablet Take 1 tablet by mouth every 8 (eight) hours as needed. Pain  21 tablet  0  . lisinopril (PRINIVIL,ZESTRIL) 5 MG tablet Take 1 tablet (5 mg total) by mouth daily.  30 tablet  6  . meloxicam (MOBIC) 15 MG tablet Take 15 mg by mouth daily.      . simvastatin (ZOCOR) 40 MG tablet Take 1 tablet (40 mg total) by mouth at bedtime.  30 tablet  6  . ZOSTAVAX 16109 UNT/0.65ML injection        No current facility-administered medications for this visit.     Past Medical History  Diagnosis Date  . Cerebrovascular disease     h/o CVA in 07/2011 + left carotid endarterectomy  . Hyperlipemia   . Pneumonia   . COPD (chronic obstructive pulmonary disease)     on 2L O2 at home since 08/2011  . 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 ICD  (implantable cardiac defibrillator) in place   . Coronary artery disease   . CHF (congestive heart failure)   . Myocardial infarction 08/2011  . Stroke 07/2011    denies residual (06/14/2012)  . Chronic lower back pain   . Arthritis     "back; knees" (06/14/2012)  . DDD (degenerative disc disease), lumbosacral   . Depression 09/2011    "after OHS" (06/14/2012)  . Cholelithiasis 07/05/2012    Asymptomatic; identified on CT scanning of the chest;  . Other primary cardiomyopathies     ROS:   All systems reviewed and negative except as noted in the HPI.   Past Surgical History  Procedure Laterality Date  . Back surgery    . Knee arthroscopy  2002    "right" (06/14/2012)  . Foreign body removal  06/05/2011    Procedure: FOREIGN BODY REMOVAL ADULT;  Surgeon: Rosalio Macadamia;  Location: [facility];  Service: Plastics;  Laterality: Right;  removal foreign body from right side face   . Carotid endarterectomy  07/2011    "left" (06/14/2012)  . Tee without cardioversion  10/17/2011    Procedure: TRANSESOPHAGEAL ECHOCARDIOGRAM (TEE);  Surgeon: Dolores Patty, MD;  Location: [facility] ENDOSCOPY;  Service: Cardiovascular;  Laterality:  N/A;  . Coronary artery bypass graft  10/20/2011    Procedure: CORONARY ARTERY BYPASS GRAFTING (CABG);  Surgeon: Alleen Borne, MD;  Location: [facility] OR;  Service: Open Heart Surgery;  Laterality: N/A;  CABG x six;  using left internal mammary artery and right leg greater saphenous vein harvested endoscopically  . Mitral valve repair  10/20/2011    Procedure: MITRAL VALVE REPAIR (MVR);  Surgeon: Alleen Borne, MD;  Location: [facility] OR;  Service: Open Heart Surgery;  Laterality: N/A;  . Cardiac defibrillator placement  06/14/2012  . Posterior lumbar fusion  1996  . Lumbar disc surgery  2012  . Cardiac catheterization  09/2011  . Colonoscopy  Never     Family History  Problem Relation Age of Onset  . Coronary artery disease Mother   . Coronary artery  disease Father   . Arrhythmia Brother      History   Social History  . Marital Status: Married    Spouse Name: N/A    Number of Children: N/A  . Years of Education: N/A   Occupational History  . Not on file.   Social History Main Topics  . Smoking status: Current Every Day Smoker -- 1.50 packs/day for 42 years    Types: Cigarettes  . Smokeless tobacco: Never Used     Comment: quit from 09-10-2011 and started back smoking 03-26-2012  . 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 Drug Use: No  . Sexual Activity: No     Comment: Quit drinking alcohol 2 yrs ago   Other Topics Concern  . 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     BP 122/63  Pulse 89  Ht 5\' 11"  (1.803 m)  Wt 201 lb (91.173 kg)  BMI 28.05 kg/m2  Physical Exam:  Well appearing 57 year old man,NAD HEENT: Unremarkable Neck:  6 cm JVD, no thyromegally Lungs:  Clear with no wheezes, rales, or rhonchi. HEART:  Regular rate rhythm, grade 2/6 systolic murmur, no rubs, no clicks Abd:  soft, positive bowel sounds, no organomegally, no rebound, no guarding Ext:  2 plus pulses, no edema, no cyanosis, no clubbing Skin:  No rashes no nodules Neuro:  CN II through XII intact, motor grossly intact  DEVICE  Normal device function.  See PaceArt for details.   Assess/Plan:

## 2013-06-02 NOTE — Assessment & Plan Note (Signed)
His Medtronic biventricular ICD is working normally. We'll plan to recheck in several months. 

## 2013-06-02 NOTE — Patient Instructions (Signed)
Your physician recommends that you schedule a follow-up appointment in: 1 year with Dr Court Joy will receive a reminder letter two months in advance reminding you to call and schedule your appointment. If you don't receive this letter, please contact our office.  Remote monitoring is used to monitor your Pacemaker of ICD from home. This monitoring reduces the number of office visits required to check your device to one time per year. It allows [place] to keep an eye on the functioning of your device to ensure it is working properly. You are scheduled for a device check from home on 09-03-13. You may send your transmission at any time that day. If you have a wireless device, the transmission will be sent automatically. After your physician reviews your transmission, you will receive a postcard with your next transmission date.

## 2013-06-03 ENCOUNTER — Encounter: Payer: Self-pay | Admitting: Internal Medicine

## 2013-06-03 ENCOUNTER — Telehealth: Payer: Self-pay | Admitting: Internal Medicine

## 2013-06-03 ENCOUNTER — Other Ambulatory Visit: Payer: Self-pay | Admitting: *Deleted

## 2013-06-03 MED ORDER — FUROSEMIDE 20 MG PO TABS: ORAL_TABLET | ORAL | Status: DC

## 2013-06-03 NOTE — Telephone Encounter (Signed)
Needs RX for his fluid pill sent to [facility] in [place] / tgs

## 2013-06-24 ENCOUNTER — Encounter (HOSPITAL_COMMUNITY): Payer: Self-pay

## 2013-06-24 ENCOUNTER — Encounter (HOSPITAL_COMMUNITY): Payer: Medicare Other

## 2013-06-24 ENCOUNTER — Encounter (HOSPITAL_COMMUNITY): Payer: Medicare Other | Attending: Hematology and Oncology

## 2013-06-24 VITALS — BP 96/56 | HR 70 | Temp 97.6°F | Resp 18 | Ht 67.5 in | Wt 210.0 lb

## 2013-06-24 DIAGNOSIS — D72829 Elevated white blood cell count, unspecified: Secondary | ICD-10-CM

## 2013-06-24 DIAGNOSIS — D729 Disorder of white blood cells, unspecified: Secondary | ICD-10-CM

## 2013-06-24 DIAGNOSIS — D7289 Other specified disorders of white blood cells: Secondary | ICD-10-CM | POA: Insufficient documentation

## 2013-06-24 DIAGNOSIS — F17200 Nicotine dependence, unspecified, uncomplicated: Secondary | ICD-10-CM

## 2013-06-24 LAB — COMPREHENSIVE METABOLIC PANEL
ALT: 16 U/L (ref 0–53)
AST: 17 U/L (ref 0–37)
Alkaline Phosphatase: 63 U/L (ref 39–117)
CO2: 24 mEq/L (ref 19–32)
GFR calc Af Amer: 80 mL/min — ABNORMAL LOW (ref >90)
Glucose, Bld: 65 mg/dL — ABNORMAL LOW (ref 70–99)
Potassium: 4.8 mEq/L (ref 3.7–5.3)
Sodium: 135 mEq/L — ABNORMAL LOW (ref 137–147)
Total Bilirubin: 0.3 mg/dL (ref 0.3–1.2)
Total Protein: 7.9 g/dL (ref 6.0–8.3)

## 2013-06-24 LAB — CBC WITH DIFFERENTIAL/PLATELET
Basophils Relative: 1 % (ref 0–1)
Eosinophils Absolute: 0.5 10*3/uL (ref 0.0–0.7)
Eosinophils Relative: 4 % (ref 0–5)
HCT: 39.4 % (ref 39.0–52.0)
Hemoglobin: 13.9 g/dL (ref 13.0–17.0)
Lymphs Abs: 3.6 10*3/uL (ref 0.7–4.0)
MCH: 32.1 pg (ref 26.0–34.0)
MCHC: 35.3 g/dL (ref 30.0–36.0)
MCV: 91 fL (ref 78.0–100.0)
Monocytes Absolute: 1.6 10*3/uL — ABNORMAL HIGH (ref 0.1–1.0)
Monocytes Relative: 13 % — ABNORMAL HIGH (ref 3–12)
Neutrophils Relative %: 53 % (ref 43–77)
RDW: 13.1 % (ref 11.5–15.5)

## 2013-06-24 NOTE — Progress Notes (Signed)
Charles Savage presented for labwork. Labs per MD order drawn via Peripheral Line 23 gauge needle inserted in Right AC  Good blood return present. Procedure without incident.  Needle removed intact. Patient tolerated procedure well.

## 2013-06-24 NOTE — Progress Notes (Signed)
[facility] Earl Lites A. Zigmund Daniel, M.D.  NEW PATIENT EVALUATION   Name: Charles Savage Date: 06/24/2013 MRN: [id number] DOB: 09-27-1955  PCP: Colette Ribas, MD   REFERRING PHYSICIAN: Corrie Mckusick, MD  REASON FOR REFERRAL: Lymphocytosis in May 2014 with neutrophilic leukocytosis in November of 2014.    HISTORY OF PRESENT ILLNESS:Charles Savage is a 57 y.o. male who is referred by family physician because of discovery of lymphocytosis on CBC done on 11/14/2012. CBC done on 05/15/2013 show a white count of 17,200 with hemoglobin 16 and platelets were clumped. Neutrophils are elevated. Previous CBC done on 11/14/2012 show a white count of 11,600 with an increase in absolute lymphocyte count. He is here today to try to explain this conundrum. Appetite is good with no easy satiety, fever, night sweats, lower extremity swelling or redness, chest pain, PND, orthopnea, palpitations, headache, skin rash, joint pain, frequent infections, but with an allergic reaction occurring about 2 months ago after eating and a [place]. He was treated in the emergency room at that time was found to have an elevation in his white cell count. Unfortunately continues to smoke.   PAST MEDICAL HISTORY:  has a past medical history of Cerebrovascular disease; Hyperlipemia; Pneumonia; COPD (chronic obstructive pulmonary disease); Arteriosclerotic cardiovascular disease (ASCVD); ICD (implantable cardiac defibrillator) in place; Coronary artery disease; CHF (congestive heart failure); Myocardial infarction (08/2011); Stroke (07/2011); Chronic lower back pain; Arthritis; DDD (degenerative disc disease), lumbosacral; Depression (09/2011); Cholelithiasis (07/05/2012); and Other primary cardiomyopathies.     PAST SURGICAL HISTORY: Past Surgical History  Procedure Laterality Date  . Back surgery    . Knee arthroscopy  2002    "right" (06/14/2012)  . Foreign body removal   06/05/2011    Procedure: FOREIGN BODY REMOVAL ADULT;  Surgeon: Rosalio Macadamia;  Location: [facility];  Service: Plastics;  Laterality: Right;  removal foreign body from right side face   . Carotid endarterectomy  07/2011    "left" (06/14/2012)  . Tee without cardioversion  10/17/2011    Procedure: TRANSESOPHAGEAL ECHOCARDIOGRAM (TEE);  Surgeon: Dolores Patty, MD;  Location: [facility];  Service: Cardiovascular;  Laterality: N/A;  . Coronary artery bypass graft  10/20/2011    Procedure: CORONARY ARTERY BYPASS GRAFTING (CABG);  Surgeon: Alleen Borne, MD;  Location: [facility] OR;  Service: Open Heart Surgery;  Laterality: N/A;  CABG x six;  using left internal mammary artery and right leg greater saphenous vein harvested endoscopically  . Mitral valve repair  10/20/2011    Procedure: MITRAL VALVE REPAIR (MVR);  Surgeon: Alleen Borne, MD;  Location: [facility] OR;  Service: Open Heart Surgery;  Laterality: N/A;  . Cardiac defibrillator placement  06/14/2012  . Posterior lumbar fusion  1996  . Lumbar disc surgery  2012  . Cardiac catheterization  09/2011     CURRENT MEDICATIONS: has a current medication list which includes the following prescription(s): aspirin, carvedilol, docusate sodium, epipen 2-pak, escitalopram, furosemide, furosemide, hydrocodone-acetaminophen, lisinopril, meloxicam, simvastatin, and zostavax.   ALLERGIES: Metoprolol; Peach flavor; and Morphine and related   SOCIAL HISTORY:  reports that he has been smoking Cigarettes.  He has a 63 pack-year smoking history. He has never used smokeless tobacco. He reports that he does not drink alcohol or use illicit drugs.   FAMILY HISTORY: family history includes Arrhythmia in his brother; Coronary artery disease in his father and mother.    REVIEW OF SYSTEMS:  Other than that discussed above is noncontributory.    PHYSICAL EXAM:  height is 5' 7.5" (1.715 m) and weight is 210 lb (95.255 kg). His oral temperature is  97.6 F (36.4 C). His blood pressure is 96/56 and his pulse is 70. His respiration is 18.    GENERAL:alert, no distress and comfortable SKIN: skin color, texture, turgor are normal, no rashes or significant lesions EYES: normal, Conjunctiva are pink and non-injected, sclera clear OROPHARYNX:no exudate, no erythema and lips, buccal mucosa, and tongue normal  NECK: supple, thyroid normal size, non-tender, without nodularity CHEST: Increased AP diameter with midline sternotomy scar that is well-healed. No gynecomastia. Defibrillator in place. LYMPH:  no palpable lymphadenopathy in the cervical, axillary or inguinal LUNGS: clear to auscultation and percussion with normal breathing effort HEART: regular rate & rhythm and no murmurs ABDOMEN:abdomen soft, non-tender and normal bowel sounds. Liver and spleen not enlarged. MUSCULOSKELETALl:no cyanosis of digits, no clubbing or edema  NEURO: alert & oriented x 3 with fluent speech, no focal motor/sensory deficits    LABORATORY DATA:  Office Visit on 06/24/2013  Component Date Value Range Status  . Sodium 06/24/2013 135* 137 - 147 mEq/L Final   Please note change in reference range.  . Potassium 06/24/2013 4.8  3.7 - 5.3 mEq/L Final   Please note change in reference range.  . Chloride 06/24/2013 98  96 - 112 mEq/L Final  . CO2 06/24/2013 24  19 - 32 mEq/L Final  . Glucose, Bld 06/24/2013 65* 70 - 99 mg/dL Final  . BUN 16/03/9603 16  6 - 23 mg/dL Final  . Creatinine, Ser 06/24/2013 1.14  0.50 - 1.35 mg/dL Final  . Calcium 54/02/8118 9.1  8.4 - 10.5 mg/dL Final  . Total Protein 06/24/2013 7.9  6.0 - 8.3 g/dL Final  . Albumin 14/78/2956 3.8  3.5 - 5.2 g/dL Final  . AST 21/30/8657 17  0 - 37 U/L Final  . ALT 06/24/2013 16  0 - 53 U/L Final  . Alkaline Phosphatase 06/24/2013 63  39 - 117 U/L Final  . Total Bilirubin 06/24/2013 0.3  0.3 - 1.2 mg/dL Final  . GFR calc non Af Amer 06/24/2013 69* >90 mL/min Final  . GFR calc Af Amer 06/24/2013 80*  >90 mL/min Final   Comment: (NOTE)                          The eGFR has been calculated using the CKD EPI equation.                          This calculation has not been validated in all clinical situations.                          eGFR's persistently <90 mL/min signify possible Chronic Kidney                          Disease.  Marland Kitchen LDH 06/24/2013 196  94 - 250 U/L Final  . WBC 06/24/2013 12.2* 4.0 - 10.5 K/uL Final  . RBC 06/24/2013 4.33  4.22 - 5.81 MIL/uL Final  . Hemoglobin 06/24/2013 13.9  13.0 - 17.0 g/dL Final  . HCT 84/69/6295 39.4  39.0 - 52.0 % Final  . MCV 06/24/2013 91.0  78.0 - 100.0 fL Final  . MCH 06/24/2013 32.1  26.0 - 34.0 pg Final  .  MCHC 06/24/2013 35.3  30.0 - 36.0 g/dL Final  . RDW 16/03/9603 13.1  11.5 - 15.5 % Final  . Platelets 06/24/2013 306  150 - 400 K/uL Final  . Neutrophils Relative % 06/24/2013 53  43 - 77 % Final  . Neutro Abs 06/24/2013 6.5  1.7 - 7.7 K/uL Final  . Lymphocytes Relative 06/24/2013 30  12 - 46 % Final  . Lymphs Abs 06/24/2013 3.6  0.7 - 4.0 K/uL Final  . Monocytes Relative 06/24/2013 13* 3 - 12 % Final  . Monocytes Absolute 06/24/2013 1.6* 0.1 - 1.0 K/uL Final  . Eosinophils Relative 06/24/2013 4  0 - 5 % Final  . Eosinophils Absolute 06/24/2013 0.5  0.0 - 0.7 K/uL Final  . Basophils Relative 06/24/2013 1  0 - 1 % Final  . Basophils Absolute 06/24/2013 0.1  0.0 - 0.1 K/uL Final  Office Visit on 06/02/2013  Component Date Value Range Status  . Date Time Interrogation Session 06/02/2013 [id number]   Final  . Pulse Generator Manufacturer 06/02/2013 Medtronic   Final  . Pulse Gen Model 06/02/2013 [id number] Evera XT VR   Final  . Pulse Gen Serial Number 06/02/2013 [id number]   Final  . RV Sense Sensitivity 06/02/2013 0.3   Final  . RV Pace PulseWidth 06/02/2013 0.4   Final  . RV Pace Amplitude 06/02/2013 2   Final  . Zone Setting Type Category 06/02/2013 VF   Final  . Zone Detect Interval 06/02/2013 280   Final  . Zone Setting Type  Category 06/02/2013 VT   Final  . Zone Detect Interval 06/02/2013 Blank   Final  . Zone Setting Type Category 06/02/2013 VENTRICULAR_TACHYCARDIA_1   Final  . Zone Detect Interval 06/02/2013 360   Final  . Zone Setting Type Category 06/02/2013 VENTRICULAR_TACHYCARDIA_2   Final  . Zone Detect Interval 06/02/2013 450   Final  . Zone Setting Type Category 06/02/2013 ATRIAL_FIBRILLATION   Final  . Zone Setting Type Category 06/02/2013 ATAF   Final  . RV IMPEDANCE 06/02/2013 589   Final  . RV Amplitude 06/02/2013 16.875   Final  . RV Pacing Amplitude 06/02/2013 0.5   Final  . RV Pacing PulseWidth 06/02/2013 0.4   Final  . HighPow Impedance 06/02/2013 228   Final  . HighPow Impedance 06/02/2013 79   Final  . Battery Status 06/02/2013 OK   Final  . Battery Longevity 06/02/2013 133   Final  . Battery Voltage 06/02/2013 3.06   Final  . Huston Foley RV Perc Paced 06/02/2013 0.01   Final  . Eval Rhythm 06/02/2013 SR@90    Final  . Miscellaneous Comment 06/02/2013    Final                   Value:ICD check in clinic. Normal device function. Thresholds and sensing consistent with previous device measurements. Impedance trends stable over time. No evidence of any ventricular arrhythmias. No mode switches. Histogram distribution appropriate for                          patient and level of activity. No changes made this session. Device programmed at appropriate safety margins. Device programmed to optimize intrinsic conduction. Estimated longevity 11 years. Pt enrolled in remote follow-up. Plan to check device every 3                          months remotely and in office annually.  Optivol and thoracic impedance abnormal in July.   Patient education completed including shock plan. Alert tones/vibration demonstrated for patient.  Carelink 09/03/13.    Urinalysis    Component Value Date/Time   COLORURINE YELLOW 10/19/2011 0609   APPEARANCEUR CLEAR 10/19/2011 0609   LABSPEC 1.019 10/19/2011 0609   PHURINE 7.5  10/19/2011 0609   GLUCOSEU NEGATIVE 10/19/2011 0609   HGBUR NEGATIVE 10/19/2011 0609   BILIRUBINUR NEGATIVE 10/19/2011 0609   KETONESUR NEGATIVE 10/19/2011 0609   PROTEINUR 30* 10/19/2011 0609   UROBILINOGEN 0.2 10/19/2011 0609   NITRITE NEGATIVE 10/19/2011 0609   LEUKOCYTESUR NEGATIVE 10/19/2011 0609      @RADIOGRAPHY : No results found.  PATHOLOGY: Peripheral smear failed to reveal evidence of premature forms. Platelet clumping is noted.   IMPRESSION:  #1. Probable reactive leukocytosis manifesting as elevation neutrophils at one time an elevation in lymphocytes at another. #2. Coronary artery disease, status post sextuple bypass plus valve replacement, continuing to smoke. #3. Peripheral vascular disease, status post endarterectomy. #4. History of thrombotic stroke, no residual neurologic deficit. #5. History of ventricular dysrhythmia, defibrillator in place.   PLAN:  #1. The patient was seriously admonished to stop smoking! This was done in the presence of his wife. #2. Additional lab tests were done today to help clarify the white cell count conundrum. #3. Will be seen again in 3 weeks for discussion of results with additional testing to be done if necessary prior to that visit in order to clarify the situation.  I appreciate the opportunity of sharing in his care.     Maurilio Lovely, MD 06/24/2013 6:48 PM

## 2013-06-24 NOTE — Patient Instructions (Signed)
[facility] Discharge Instructions  RECOMMENDATIONS MADE BY THE CONSULTANT AND ANY TEST RESULTS WILL BE SENT TO YOUR REFERRING PHYSICIAN.  EXAM FINDINGS BY THE PHYSICIAN TODAY AND SIGNS OR SYMPTOMS TO REPORT TO CLINIC OR PRIMARY PHYSICIAN: Exam and findings as discussed by Dr. Zigmund Daniel.  We need to do some additional lab studies to try to find out what is going on.  Will see you back after results are available to discuss findings.  MEDICATIONS PRESCRIBED:  none  INSTRUCTIONS/FOLLOW-UP: Follow-up in 3 weeks.  Thank you for choosing [facility] to provide your oncology and hematology care.  To afford each patient quality time with our providers, please arrive at least 15 minutes before your scheduled appointment time.  With your help, our goal is to use those 15 minutes to complete the necessary work-up to ensure our physicians have the information they need to help with your evaluation and healthcare recommendations.    Effective January 1st, 2014, we ask that you re-schedule your appointment with our physicians should you arrive 10 or more minutes late for your appointment.  We strive to give you quality time with our providers, and arriving late affects you and other patients whose appointments are after yours.    Again, thank you for choosing [facility].  Our hope is that these requests will decrease the amount of time that you wait before being seen by our physicians.       _____________________________________________________________  Should you have questions after your visit to [facility], please contact our office at [phone] between the hours of 8:30 a.m. and 5:00 p.m.  Voicemails left after 4:30 p.m. will not be returned until the following business day.  For prescription refill requests, have your pharmacy contact our office with your prescription refill request.

## 2013-06-27 ENCOUNTER — Encounter (HOSPITAL_COMMUNITY): Payer: Medicare Other | Attending: Hematology and Oncology

## 2013-06-27 DIAGNOSIS — D7289 Other specified disorders of white blood cells: Secondary | ICD-10-CM | POA: Insufficient documentation

## 2013-06-27 DIAGNOSIS — R42 Dizziness and giddiness: Secondary | ICD-10-CM | POA: Insufficient documentation

## 2013-06-27 LAB — P210 BCR-ABL 1: P210 BCR ABL1: NOT DETECTED

## 2013-06-27 LAB — BCR/ABL GENE REARRANGEMENT QNT, PCR
BCR ABL1 / ABL1 IS: 0 %
BCR ABL1 / ABL1: 0 %

## 2013-06-27 LAB — P190 BCR-ABL 1: P190 BCR ABL1: NOT DETECTED

## 2013-06-27 NOTE — Progress Notes (Signed)
Labs redrawn today for LAP

## 2013-07-01 LAB — LEUKOCYTE ALKALINE PHOSPHATASE: Leukocyte Alkaline  Phos Stain: 66 (ref 22–124)

## 2013-07-02 ENCOUNTER — Telehealth: Payer: Self-pay | Admitting: *Deleted

## 2013-07-02 MED ORDER — CARVEDILOL 12.5 MG PO TABS: 12.5000 mg | ORAL_TABLET | Freq: Two times a day (BID) | ORAL | Status: DC

## 2013-07-02 MED ORDER — FUROSEMIDE 20 MG PO TABS: ORAL_TABLET | ORAL | Status: DC

## 2013-07-02 MED ORDER — LISINOPRIL 5 MG PO TABS: 5.0000 mg | ORAL_TABLET | Freq: Every day | ORAL | Status: DC

## 2013-07-02 NOTE — Telephone Encounter (Signed)
CARVEDILOL AND LISINOPRIL

## 2013-07-02 NOTE — Telephone Encounter (Signed)
NEEDS CALLED IN TO [facility] IN [place] THEY HAVE SENT [place] REQUEST

## 2013-07-02 NOTE — Telephone Encounter (Signed)
Medication sent via escribe.  

## 2013-07-15 ENCOUNTER — Ambulatory Visit (HOSPITAL_COMMUNITY): Payer: Medicare Other

## 2013-07-18 ENCOUNTER — Encounter (HOSPITAL_BASED_OUTPATIENT_CLINIC_OR_DEPARTMENT_OTHER): Payer: Medicare Other

## 2013-07-18 ENCOUNTER — Encounter (HOSPITAL_COMMUNITY): Payer: Self-pay

## 2013-07-18 ENCOUNTER — Encounter (HOSPITAL_COMMUNITY): Payer: Medicare Other

## 2013-07-18 VITALS — BP 115/72 | HR 75 | Temp 97.8°F | Resp 18 | Wt 212.7 lb

## 2013-07-18 DIAGNOSIS — D729 Disorder of white blood cells, unspecified: Secondary | ICD-10-CM

## 2013-07-18 DIAGNOSIS — D7289 Other specified disorders of white blood cells: Secondary | ICD-10-CM

## 2013-07-18 DIAGNOSIS — R42 Dizziness and giddiness: Secondary | ICD-10-CM

## 2013-07-18 LAB — CBC WITH DIFFERENTIAL/PLATELET
Basophils Absolute: 0.1 10*3/uL (ref 0.0–0.1)
Basophils Relative: 1 % (ref 0–1)
EOS ABS: 0.4 10*3/uL (ref 0.0–0.7)
Eosinophils Relative: 5 % (ref 0–5)
HCT: 38.7 % — ABNORMAL LOW (ref 39.0–52.0)
Hemoglobin: 13.5 g/dL (ref 13.0–17.0)
LYMPHS ABS: 2.6 10*3/uL (ref 0.7–4.0)
Lymphocytes Relative: 26 % (ref 12–46)
MCH: 31.6 pg (ref 26.0–34.0)
MCHC: 34.9 g/dL (ref 30.0–36.0)
MCV: 90.6 fL (ref 78.0–100.0)
Monocytes Absolute: 0.8 10*3/uL (ref 0.1–1.0)
Monocytes Relative: 8 % (ref 3–12)
NEUTROS ABS: 5.8 10*3/uL (ref 1.7–7.7)
NEUTROS PCT: 60 % (ref 43–77)
Platelets: 191 10*3/uL (ref 150–400)
RBC: 4.27 MIL/uL (ref 4.22–5.81)
RDW: 12.7 % (ref 11.5–15.5)
WBC: 9.7 10*3/uL (ref 4.0–10.5)

## 2013-07-18 LAB — COMPREHENSIVE METABOLIC PANEL
ALT: 17 U/L (ref 0–53)
AST: 18 U/L (ref 0–37)
Albumin: 3.7 g/dL (ref 3.5–5.2)
Alkaline Phosphatase: 68 U/L (ref 39–117)
BUN: 11 mg/dL (ref 6–23)
CO2: 24 meq/L (ref 19–32)
CREATININE: 1.01 mg/dL (ref 0.50–1.35)
Calcium: 9.2 mg/dL (ref 8.4–10.5)
Chloride: 100 mEq/L (ref 96–112)
GFR calc Af Amer: >90 mL/min (ref >90)
GFR, EST NON AFRICAN AMERICAN: 80 mL/min — AB (ref >90)
Glucose, Bld: 157 mg/dL — ABNORMAL HIGH (ref 70–99)
Potassium: 4.4 mEq/L (ref 3.7–5.3)
Sodium: 136 mEq/L — ABNORMAL LOW (ref 137–147)
Total Bilirubin: 0.2 mg/dL — ABNORMAL LOW (ref 0.3–1.2)
Total Protein: 8.1 g/dL (ref 6.0–8.3)

## 2013-07-18 NOTE — Progress Notes (Signed)
[facility]  OFFICE PROGRESS NOTE  Purvis Kilts, MD [address] [place] [address]  DIAGNOSIS: Neutrophilic leukocytosis - Plan: CBC with Differential, CBC with Differential, CBC with Differential  Episodic lightheadedness relieved by eating - Plan: Insulin, random, Comprehensive metabolic panel, CT Abdomen W Contrast, Comprehensive metabolic panel  Chief Complaint  Patient presents with  . Follow-up  . Leukocytosis    CURRENT THERAPY: Followup of neutrophilic leukocytosis  INTERVAL HISTORY: Harman Langhans Dugdale 58 y.o. male returns for followup after evaluation for leukocytosis.  Over the past 2 or 3 weeks she's had episodes of "weak spells" like he was going to pass out. He spells relieved by 18. He never occur during the night. He feels when he and diaphoretic during these episodes. He denies a diarrhea, back pain that is worsening, PND, orthopnea, or palpitations. He also denies any headache, focal weakness, lower extremity swelling or redness, skin rash, flushing, headache, or seizures.  MEDICAL HISTORY: Past Medical History  Diagnosis Date  . Cerebrovascular disease     h/o CVA in 07/2011 + left carotid endarterectomy  . Hyperlipemia   . Pneumonia   . COPD (chronic obstructive pulmonary disease)     on 2L O2 at home since 08/2011  . 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 ICD (implantable cardiac defibrillator) in place   . Coronary artery disease   . CHF (congestive heart failure)   . Myocardial infarction 08/2011  . Stroke 07/2011    denies residual (06/14/2012)  . Chronic lower back pain   . Arthritis     "back; knees" (06/14/2012)  . DDD (degenerative disc disease), lumbosacral   . Depression 09/2011    "after OHS" (06/14/2012)  . Cholelithiasis 07/05/2012    Asymptomatic; identified on CT scanning of the chest;  . Other primary  cardiomyopathies     INTERIM HISTORY: has Hyperlipemia; Lumbar disc disease; Cerebrovascular disease; Tobacco abuse, in remission; Arteriosclerotic cardiovascular disease (ASCVD); COPD (chronic obstructive pulmonary disease); History of diagnostic tests; ICD (implantable cardioverter-defibrillator) in place; Cholelithiasis; and Chronic systolic heart failure on his problem list.    ALLERGIES:  is allergic to metoprolol; peach flavor; and morphine and related.  MEDICATIONS: has a current medication list which includes the following prescription(s): aspirin, carvedilol, docusate sodium, escitalopram, furosemide, hydrocodone-acetaminophen, lisinopril, meloxicam, simvastatin, and epipen 2-pak.  SURGICAL HISTORY:  Past Surgical History  Procedure Laterality Date  . Back surgery    . Knee arthroscopy  2002    "right" (06/14/2012)  . Foreign body removal  06/05/2011    Procedure: FOREIGN BODY REMOVAL ADULT;  Surgeon: Hermelinda Dellen;  Location: [facility];  Service: Plastics;  Laterality: Right;  removal foreign body from right side face   . Carotid endarterectomy  07/2011    "left" (06/14/2012)  . Tee without cardioversion  10/17/2011    Procedure: TRANSESOPHAGEAL ECHOCARDIOGRAM (TEE);  Surgeon: Jolaine Artist, MD;  Location: [facility] ENDOSCOPY;  Service: Cardiovascular;  Laterality: N/A;  . Coronary artery bypass graft  10/20/2011    Procedure: CORONARY ARTERY BYPASS GRAFTING (CABG);  Surgeon: [name], MD;  Location: [facility];  Service: Open Heart Surgery;  Laterality: N/A;  CABG x six;  using left internal mammary artery and right leg greater saphenous vein harvested endoscopically  . Mitral valve repair  10/20/2011    Procedure: MITRAL VALVE REPAIR (MVR);  Surgeon: [name], MD;  Location: [facility];  Service: Open Heart Surgery;  Laterality: N/A;  . Cardiac defibrillator placement  06/14/2012  . Posterior lumbar fusion  1996  . Lumbar disc surgery  2012  . Cardiac  catheterization  09/2011    FAMILY HISTORY: family history includes Arrhythmia in his brother; Coronary artery disease in his father and mother.  SOCIAL HISTORY:  reports that he has been smoking Cigarettes.  He has a 63 pack-year smoking history. He has never used smokeless tobacco. He reports that he does not drink alcohol or use illicit drugs.  REVIEW OF SYSTEMS:  Other than that discussed above is noncontributory.  PHYSICAL EXAMINATION: ECOG PERFORMANCE STATUS: 1 - Symptomatic but completely ambulatory  Blood pressure 115/72, pulse 75, temperature 97.8 F (36.6 C), temperature source Oral, resp. rate 18, weight 212 lb 11.2 oz (96.48 kg).  GENERAL:alert, no distress and comfortable SKIN: skin color, texture, turgor are normal, no rashes or significant lesions EYES: PERLA; Conjunctiva are pink and non-injected, sclera clear OROPHARYNX:no exudate, no erythema on lips, buccal mucosa, or tongue. NECK: supple, thyroid normal size, non-tender, without nodularity. No masses. Bilateral endarterectomy scars without carotid bruits. CHEST: Increased AP diameter with no breast masses. The defibrillator in place. Midline sternotomy scar that is well-healed. LYMPH:  no palpable lymphadenopathy in the cervical, axillary or inguinal LUNGS: clear to auscultation and percussion with normal breathing effort HEART: regular rate & rhythm and no murmurs. ABDOMEN:abdomen soft, non-tender and normal bowel sounds MUSCULOSKELETAL:no cyanosis of digits and no clubbing. Range of motion normal.  NEURO: alert & oriented x 3 with fluent speech, no focal motor/sensory deficits   LABORATORY DATA: Office Visit on 07/18/2013  Component Date Value Range Status  . Sodium 07/18/2013 136* 137 - 147 mEq/L Final  . Potassium 07/18/2013 4.4  3.7 - 5.3 mEq/L Final  . Chloride 07/18/2013 100  96 - 112 mEq/L Final  . CO2 07/18/2013 24  19 - 32 mEq/L Final  . Glucose, Bld 07/18/2013 157* 70 - 99 mg/dL Final  . BUN  07/18/2013 11  6 - 23 mg/dL Final  . Creatinine, Ser 07/18/2013 1.01  0.50 - 1.35 mg/dL Final  . Calcium 07/18/2013 9.2  8.4 - 10.5 mg/dL Final  . Total Protein 07/18/2013 8.1  6.0 - 8.3 g/dL Final  . Albumin 07/18/2013 3.7  3.5 - 5.2 g/dL Final  . AST 07/18/2013 18  0 - 37 U/L Final  . ALT 07/18/2013 17  0 - 53 U/L Final  . Alkaline Phosphatase 07/18/2013 68  39 - 117 U/L Final  . Total Bilirubin 07/18/2013 0.2* 0.3 - 1.2 mg/dL Final  . GFR calc non Af Amer 07/18/2013 80* >90 mL/min Final  . GFR calc Af Amer 07/18/2013 >90  >90 mL/min Final   Comment: (NOTE)                          The eGFR has been calculated using the CKD EPI equation.                          This calculation has not been validated in all clinical situations.                          eGFR's persistently <90 mL/min signify possible Chronic Kidney  Disease.  . WBC 07/18/2013 9.7  4.0 - 10.5 K/uL Final  . RBC 07/18/2013 4.27  4.22 - 5.81 MIL/uL Final  . Hemoglobin 07/18/2013 13.5  13.0 - 17.0 g/dL Final  . HCT 07/18/2013 38.7* 39.0 - 52.0 % Final  . MCV 07/18/2013 90.6  78.0 - 100.0 fL Final  . MCH 07/18/2013 31.6  26.0 - 34.0 pg Final  . MCHC 07/18/2013 34.9  30.0 - 36.0 g/dL Final  . RDW 07/18/2013 12.7  11.5 - 15.5 % Final  . Platelets 07/18/2013 191  150 - 400 K/uL Final  . Neutrophils Relative % 07/18/2013 60  43 - 77 % Final  . Neutro Abs 07/18/2013 5.8  1.7 - 7.7 K/uL Final  . Lymphocytes Relative 07/18/2013 26  12 - 46 % Final  . Lymphs Abs 07/18/2013 2.6  0.7 - 4.0 K/uL Final  . Monocytes Relative 07/18/2013 8  3 - 12 % Final  . Monocytes Absolute 07/18/2013 0.8  0.1 - 1.0 K/uL Final  . Eosinophils Relative 07/18/2013 5  0 - 5 % Final  . Eosinophils Absolute 07/18/2013 0.4  0.0 - 0.7 K/uL Final  . Basophils Relative 07/18/2013 1  0 - 1 % Final  . Basophils Absolute 07/18/2013 0.1  0.0 - 0.1 K/uL Final  Infusion on 06/27/2013  Component Date Value Range Status  . Leukocyte  Alkaline  Phos Stain 06/27/2013 66  22 - 124 Final   Comment: (NOTE)                          INTERPRETIVE INFORMATION: Leukocyte Alkaline Phosphatase Smear                          Access complete set of age- and/ or gender specific reference                           intervals for this test in the [facility] Test Directory                           (ProgramInsider.com.pt).                          Performed at [facility] on 06/24/2013  Component Date Value Range Status  . Sodium 06/24/2013 135* 137 - 147 mEq/L Final   Please note change in reference range.  . Potassium 06/24/2013 4.8  3.7 - 5.3 mEq/L Final   Please note change in reference range.  . Chloride 06/24/2013 98  96 - 112 mEq/L Final  . CO2 06/24/2013 24  19 - 32 mEq/L Final  . Glucose, Bld 06/24/2013 65* 70 - 99 mg/dL Final  . BUN 06/24/2013 16  6 - 23 mg/dL Final  . Creatinine, Ser 06/24/2013 1.14  0.50 - 1.35 mg/dL Final  . Calcium 06/24/2013 9.1  8.4 - 10.5 mg/dL Final  . Total Protein 06/24/2013 7.9  6.0 - 8.3 g/dL Final  . Albumin 06/24/2013 3.8  3.5 - 5.2 g/dL Final  . AST 06/24/2013 17  0 - 37 U/L Final  . ALT 06/24/2013 16  0 - 53 U/L Final  . Alkaline Phosphatase 06/24/2013 63  39 - 117 U/L Final  . Total Bilirubin 06/24/2013 0.3  0.3 - 1.2 mg/dL Final  . GFR calc non Af Amer 06/24/2013 69* >90  mL/min Final  . GFR calc Af Amer 06/24/2013 80* >90 mL/min Final   Comment: (NOTE)                          The eGFR has been calculated using the CKD EPI equation.                          This calculation has not been validated in all clinical situations.                          eGFR's persistently <90 mL/min signify possible Chronic Kidney                          Disease.  Marland Kitchen LDH 06/24/2013 196  94 - 250 U/L Final  . WBC 06/24/2013 12.2* 4.0 - 10.5 K/uL Final  . RBC 06/24/2013 4.33  4.22 - 5.81 MIL/uL Final  . Hemoglobin 06/24/2013 13.9  13.0 - 17.0 g/dL Final  . HCT 06/24/2013 39.4  39.0 - 52.0 % Final  . MCV  06/24/2013 91.0  78.0 - 100.0 fL Final  . MCH 06/24/2013 32.1  26.0 - 34.0 pg Final  . MCHC 06/24/2013 35.3  30.0 - 36.0 g/dL Final  . RDW 06/24/2013 13.1  11.5 - 15.5 % Final  . Platelets 06/24/2013 306  150 - 400 K/uL Final  . Neutrophils Relative % 06/24/2013 53  43 - 77 % Final  . Neutro Abs 06/24/2013 6.5  1.7 - 7.7 K/uL Final  . Lymphocytes Relative 06/24/2013 30  12 - 46 % Final  . Lymphs Abs 06/24/2013 3.6  0.7 - 4.0 K/uL Final  . Monocytes Relative 06/24/2013 13* 3 - 12 % Final  . Monocytes Absolute 06/24/2013 1.6* 0.1 - 1.0 K/uL Final  . Eosinophils Relative 06/24/2013 4  0 - 5 % Final  . Eosinophils Absolute 06/24/2013 0.5  0.0 - 0.7 K/uL Final  . Basophils Relative 06/24/2013 1  0 - 1 % Final  . Basophils Absolute 06/24/2013 0.1  0.0 - 0.1 K/uL Final  . Beta-2 Microglobulin 06/24/2013 2.55* 1.01 - 1.73 mg/L Final   Performed at [facility]  . JAK2 GenotypR 06/24/2013 Not Detected   Final   Comment: (NOTE)                          Clinical Utility:                          The somatic acquired mutation affecting Janus Tyrosine Kinase 2 (JAK2                          V617F) in exon 14 is associated with myeloproliferative disorders                          (MPD).  JAK2 V617F has been found to be the most common molecular                          abnormality in patients with Polycythemia Vera (PV, >90%) or Essential  Thombocythemia (ET, 35% - 70%).  The lowest frequency is found in IMF                          patients (chronic Idiopathic Myelofibrosis, 50%).  The presence of the                          JAK2 mutation causes activation of molecular signals that lead to                          proliferation of hematopoietic precursors outside of their normal                          pathways including erythropoietin-independent erythroid colony growth                          in most patients with PV and some patients with ET.  The JAK2 mutation                            is considered the main oncogenic event responsible for PV development                          but its precise role in ET and IMF remains questionable and may                          suggest the requirement of other genetic events to induce these                          pathologies.  The absence of JAK2 V617F does not exclude other                          changes, including in the exon 12.                          Test Methodology:                          Patient DNA is assayed using allele specific PCR technology from                          [employer] and is tested using the Roche Light Cycler Real Time PCR                          analyzer. This assay is reported as detected when >5% of cells show                          the presence of the JAK2 V617F mutation.                          This test was performed using a kit that has not been cleared or                          approved by the FDA.  The analytical  performance characteristics of                          this test have been determined by [facility].  This                          test may not be used for diagnostic, prognostic or monitoring purposes                          without confirmation by other medically established means.                          Performed at [facility]  . BCR ABL1 / ABL1 06/24/2013 0.000   Final  . BCR ABL1 / ABL1 IS 06/24/2013 0.000   Final  . Interpretation - BCRQ 06/24/2013 REPORT   Final   Comment: (NOTE)                          The P190 and P210 BCR-ABL1 fusion transcripts are NOT                          detected.                          Reverse transcription real-time PCR is performed for                          the P190 and P210 BCR-ABL1 transcripts associated with                          the t(9;22) chromosomal translocation. For P190,                          results are expressed as a percent ratio of BCR-ABL1                           to the ABL1 transcript, and further adjusted to the                          international scale (IS) for P210.                          Assay sensitivity is dependent on RNA quality and                          sample cellularity but is usually at least 4-logs                          below BCR-ABL1 baseline transcript levels. Reference                          range is 0.000% BCR-ABL1/ABL1.                          This test was developed and its performance  characteristics have been determined by [facility], [place], [place].                          Performance characteristics refer to the                          analytical performance of the test.                                                                               Brett Fairy, M.D., [facility], [place] Director, Molecular Oncology.                          Performed at [facility]  . P190 BCR ABL1 06/24/2013 Not Detected   Final   Performed at [facility]  . P210 BCR ABL1 06/24/2013 Not Detected   Final   Performed at [facility]: JAK-2 and BCR-ABL were both negative.  Urinalysis    Component Value Date/Time   COLORURINE YELLOW 10/19/2011 0609   APPEARANCEUR CLEAR 10/19/2011 0609   LABSPEC 1.019 10/19/2011 0609   PHURINE 7.5 10/19/2011 0609   GLUCOSEU NEGATIVE 10/19/2011 0609   HGBUR NEGATIVE 10/19/2011 0609   BILIRUBINUR NEGATIVE 10/19/2011 0609   KETONESUR NEGATIVE 10/19/2011 0609   PROTEINUR 30* 10/19/2011 0609   UROBILINOGEN 0.2 10/19/2011 0609   NITRITE NEGATIVE 10/19/2011 0609   LEUKOCYTESUR NEGATIVE 10/19/2011 4917    RADIOGRAPHIC STUDIES:  Status: Final result         PACS Images    Show images for DG Chest Port 1 View         Study Result    CLINICAL DATA: Shortness of breath. Rash. Itching, possible  allergic reaction.  EXAM:  PORTABLE CHEST - 1 VIEW   COMPARISON: 06/15/2012  FINDINGS:  AICD noted. The prior median sternotomy. Previous right pleural  effusion has resolved. There is subsegmental atelectasis medially at  the right lung base. No edema. The lungs appear otherwise clear.  Heart size within normal limits. Stable posttraumatic deformity of  the right distal clavicle.  IMPRESSION:  1. Subsegmental atelectasis medially at the right lung base.  2. The lungs appear otherwise clear.  Electronically Signed  By: Sherryl Barters M.D.  On: 05/15/2013     ASSESSMENT:  #1. Reactive neutrophilic leukocytosis, no evidence of a primary bone marrow disorder. #2. Episodic lightheadedness relieved by eating. 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 #3. Coronary artery disease, status post sextuple bypass with valve replacement, continuing to smoke. #4. Peripheral vascular disease, status post endarterectomy. #5. History of thrombotic stroke, no residual neurologic deficit. #6. History of ventricular dysrhythmia, defibrillator in place.  PLAN:  #1. CBC, chem profile, and serum insulin level. #2. CT scan of the abdomen with contrast with attention to the pancreas. #3. Patient was  told to avoid starvation situations, that is to say,  carry foodstuff with you at all times and also try to eat every 4-6 hours while awake. #4. Followup in 2 weeks. Told to call day after CT scan is done for discussion of results.   All questions were answered. The patient knows to call the clinic with any problems, questions or concerns. We can certainly see the patient much sooner if necessary.   I spent 25 minutes counseling the patient face to face. The total time spent in the appointment was 30 minutes.    Doroteo Bradford, MD 07/18/2013 2:48 PM

## 2013-07-18 NOTE — Patient Instructions (Signed)
[facility] Discharge Instructions  RECOMMENDATIONS MADE BY THE CONSULTANT AND ANY TEST RESULTS WILL BE SENT TO YOUR REFERRING PHYSICIAN.  EXAM FINDINGS BY THE PHYSICIAN TODAY AND SIGNS OR SYMPTOMS TO REPORT TO CLINIC OR PRIMARY PHYSICIAN: Exam and findings as discussed by Dr. Barnet Glasgow.  We need to do some additional tests and CT scan of you abdomen with attention to your pancreas to see what's going on.   MEDICATIONS PRESCRIBED:  none  INSTRUCTIONS/FOLLOW-UP: Follow-up in 2 weeks.  Thank you for choosing [facility] to provide your oncology and hematology care.  To afford each patient quality time with our providers, please arrive at least 15 minutes before your scheduled appointment time.  With your help, our goal is to use those 15 minutes to complete the necessary work-up to ensure our physicians have the information they need to help with your evaluation and healthcare recommendations.    Effective January 1st, 2014, we ask that you re-schedule your appointment with our physicians should you arrive 10 or more minutes late for your appointment.  We strive to give you quality time with our providers, and arriving late affects you and other patients whose appointments are after yours.    Again, thank you for choosing [facility].  Our hope is that these requests will decrease the amount of time that you wait before being seen by our physicians.       _____________________________________________________________  Should you have questions after your visit to [facility], please contact our office at [phone] between the hours of 8:30 a.m. and 5:00 p.m.  Voicemails left after 4:30 p.m. will not be returned until the following business day.  For prescription refill requests, have your pharmacy contact our office with your prescription refill request.

## 2013-07-18 NOTE — Progress Notes (Signed)
Charles Savage presented for labwork. Labs per MD order drawn via Peripheral Line 23 gauge needle inserted in right AC  Good blood return present. Procedure without incident.  Needle removed intact. Patient tolerated procedure well.

## 2013-07-19 LAB — INSULIN, RANDOM: Insulin: 294 u[IU]/mL — ABNORMAL HIGH (ref 3–28)

## 2013-07-25 ENCOUNTER — Other Ambulatory Visit (HOSPITAL_COMMUNITY): Payer: Self-pay | Admitting: Hematology and Oncology

## 2013-07-25 ENCOUNTER — Ambulatory Visit (HOSPITAL_COMMUNITY)
Admission: RE | Admit: 2013-07-25 | Discharge: 2013-07-25 | Disposition: A | Discharge status: Home / Self Care | Payer: Medicare Other | Source: Ambulatory Visit | Attending: Hematology and Oncology | Admitting: Hematology and Oncology

## 2013-07-25 ENCOUNTER — Encounter (HOSPITAL_COMMUNITY): Payer: Self-pay

## 2013-07-25 DIAGNOSIS — R42 Dizziness and giddiness: Secondary | ICD-10-CM

## 2013-07-25 DIAGNOSIS — J90 Pleural effusion, not elsewhere classified: Secondary | ICD-10-CM | POA: Insufficient documentation

## 2013-07-25 DIAGNOSIS — R7309 Other abnormal glucose: Secondary | ICD-10-CM | POA: Insufficient documentation

## 2013-07-25 DIAGNOSIS — D7289 Other specified disorders of white blood cells: Secondary | ICD-10-CM | POA: Insufficient documentation

## 2013-07-25 MED ORDER — IOHEXOL 300 MG/ML  SOLN
100.0000 mL | Freq: Once | INTRAMUSCULAR | Status: AC | PRN
  Administered 2013-07-25: 100 mL via INTRAVENOUS

## 2013-08-01 ENCOUNTER — Encounter (HOSPITAL_COMMUNITY): Payer: Self-pay

## 2013-08-01 ENCOUNTER — Encounter (HOSPITAL_COMMUNITY): Payer: Medicare Other | Attending: Hematology and Oncology

## 2013-08-01 VITALS — BP 121/71 | HR 87 | Temp 98.1°F | Resp 18 | Wt 218.2 lb

## 2013-08-01 DIAGNOSIS — D7289 Other specified disorders of white blood cells: Secondary | ICD-10-CM

## 2013-08-01 DIAGNOSIS — D729 Disorder of white blood cells, unspecified: Secondary | ICD-10-CM

## 2013-08-01 DIAGNOSIS — E162 Hypoglycemia, unspecified: Secondary | ICD-10-CM | POA: Insufficient documentation

## 2013-08-01 NOTE — Patient Instructions (Signed)
[facility] Discharge Instructions  RECOMMENDATIONS MADE BY THE CONSULTANT AND ANY TEST RESULTS WILL BE SENT TO YOUR REFERRING PHYSICIAN.  EXAM FINDINGS BY THE PHYSICIAN TODAY AND SIGNS OR SYMPTOMS TO REPORT TO CLINIC OR PRIMARY PHYSICIAN: Exam and findings as discussed by Dr. Barnet Glasgow.  Your abnormal white blood count is probably reactive.  Your scan was negative.  Want to do some additional blood work to evaluate for an insulinoma. Come fasting Monday morning for blood work.  MEDICATIONS PRESCRIBED:  none  INSTRUCTIONS/FOLLOW-UP: Blood work on Monday and follow-up in 2 weeks.  Thank you for choosing [facility] to provide your oncology and hematology care.  To afford each patient quality time with our providers, please arrive at least 15 minutes before your scheduled appointment time.  With your help, our goal is to use those 15 minutes to complete the necessary work-up to ensure our physicians have the information they need to help with your evaluation and healthcare recommendations.    Effective January 1st, 2014, we ask that you re-schedule your appointment with our physicians should you arrive 10 or more minutes late for your appointment.  We strive to give you quality time with our providers, and arriving late affects you and other patients whose appointments are after yours.    Again, thank you for choosing [facility].  Our hope is that these requests will decrease the amount of time that you wait before being seen by our physicians.       _____________________________________________________________  Should you have questions after your visit to [facility], please contact our office at [phone] between the hours of 8:30 a.m. and 5:00 p.m.  Voicemails left after 4:30 p.m. will not be returned until the following business day.  For prescription refill requests, have your pharmacy contact our office with your prescription  refill request.

## 2013-08-01 NOTE — Progress Notes (Signed)
[facility]  OFFICE PROGRESS NOTE  Purvis Kilts, MD [address] [place] [address]  DIAGNOSIS: Neutrophilic leukocytosis  Hypoglycemia, suspected by symptomatology - Plan: Insulin and C-Peptide, Proinsulin/insulin ratio, PTH, intact and calcium, Comprehensive metabolic panel, Gastrin, Serum, Prolactin  Chief Complaint  Patient presents with  . Reactive leukocytosis  . 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  INTERVAL HISTORY: Diontre Harps Coriz 58 y.o. male returns for returns for followup after additional testing for neutrophilic leukocytosis. Since his last visit he had one further episode of dizziness and diaphoresis which again was relieved by eating food. These attacks have occurred over night. The most recent one occurred approximately 6 hours after eating. He denies any nausea, vomiting, chest pain, PND, orthopnea, palpitations, lower extremity swelling or redness, polyuria, polydipsia, skin rash, headache, or seizures.   MEDICAL HISTORY: Past Medical History  Diagnosis Date  . Cerebrovascular disease     h/o CVA in 07/2011 + left carotid endarterectomy  . Hyperlipemia   . Pneumonia   . COPD (chronic obstructive pulmonary disease)     on 2L O2 at home since 08/2011  . 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 ICD (implantable cardiac defibrillator) in place   . Coronary artery disease   . CHF (congestive heart failure)   . Myocardial infarction 08/2011  . Stroke 07/2011    denies residual (06/14/2012)  . Chronic lower back pain   . Arthritis     "back; knees" (06/14/2012)  . DDD (degenerative disc disease), lumbosacral   . Depression 09/2011    "after OHS" (06/14/2012)  . Cholelithiasis 07/05/2012    Asymptomatic; identified on CT scanning of the chest;  . Other primary cardiomyopathies     INTERIM  HISTORY: has Hyperlipemia; Lumbar disc disease; Cerebrovascular disease; Tobacco abuse, in remission; Arteriosclerotic cardiovascular disease (ASCVD); COPD (chronic obstructive pulmonary disease); History of diagnostic tests; ICD (implantable cardioverter-defibrillator) in place; Cholelithiasis; Chronic systolic heart failure; Neutrophilic leukocytosis; and Hypoglycemia, suspected by symptomatology on his problem list.    ALLERGIES:  is allergic to metoprolol; peach flavor; and morphine and related.  MEDICATIONS: has a current medication list which includes the following prescription(s): aspirin, carvedilol, docusate sodium, escitalopram, furosemide, hydrocodone-acetaminophen, lisinopril, meloxicam, simvastatin, and epipen 2-pak.  SURGICAL HISTORY:  Past Surgical History  Procedure Laterality Date  . Back surgery    . Knee arthroscopy  2002    "right" (06/14/2012)  . Foreign body removal  06/05/2011    Procedure: FOREIGN BODY REMOVAL ADULT;  Surgeon: Hermelinda Dellen;  Location: [facility];  Service: Plastics;  Laterality: Right;  removal foreign body from right side face   . Carotid endarterectomy  07/2011    "left" (06/14/2012)  . Tee without cardioversion  10/17/2011    Procedure: TRANSESOPHAGEAL ECHOCARDIOGRAM (TEE);  Surgeon: Jolaine Artist, MD;  Location: [facility];  Service: Cardiovascular;  Laterality: N/A;  . Coronary artery bypass graft  10/20/2011    Procedure: CORONARY ARTERY BYPASS GRAFTING (CABG);  Surgeon: [name], MD;  Location: [facility];  Service: Open Heart Surgery;  Laterality: N/A;  CABG x six;  using left internal mammary artery and right leg greater saphenous vein harvested endoscopically  . Mitral valve repair  10/20/2011    Procedure: MITRAL VALVE REPAIR (MVR);  Surgeon: [name], MD;  Location:  [facility];  Service: Open Heart Surgery;  Laterality: N/A;  . Cardiac defibrillator placement  06/14/2012  . Posterior lumbar fusion  1996  . Lumbar  disc surgery  2012  . Cardiac catheterization  09/2011    FAMILY HISTORY: family history includes Arrhythmia in his brother; Coronary artery disease in his father and mother.  SOCIAL HISTORY:  reports that he has been smoking Cigarettes.  He has a 63 pack-year smoking history. He has never used smokeless tobacco. He reports that he does not drink alcohol or use illicit drugs.  REVIEW OF SYSTEMS:  Other than that discussed above is noncontributory.  PHYSICAL EXAMINATION: ECOG PERFORMANCE STATUS: 1 - Symptomatic but completely ambulatory  Blood pressure 121/71, pulse 87, temperature 98.1 F (36.7 C), temperature source Oral, resp. rate 18, weight 218 lb 3.2 oz (98.975 kg), SpO2 96.00%.  GENERAL:alert, no distress and comfortable SKIN: skin color, texture, turgor are normal, no rashes or significant lesions EYES: PERLA; Conjunctiva are pink and non-injected, sclera clear OROPHARYNX:no exudate, no erythema on lips, buccal mucosa, or tongue. NECK: supple, thyroid normal size, non-tender, without nodularity. No masses CHEST: Increased AP diameter with sternotomy scar that is well-healed. Defibrillator in place. LYMPH:  no palpable lymphadenopathy in the cervical, axillary or inguinal LUNGS: clear to auscultation and percussion with normal breathing effort HEART: regular rate & rhythm and no murmurs. ABDOMEN:abdomen soft, non-tender and normal bowel sounds. Liver and spleen not palpable.g MUSCULOSKELETAL:no cyanosis of digits and no clubbing. Range of motion normal.  NEURO: alert & oriented x 3 with fluent speech, no focal motor/sensory deficits   LABORATORY DATA: Office Visit on 07/18/2013  Component Date Value Range Status  . Insulin 07/18/2013 294* 3 - 28 uIU/mL Final   Performed at [facility]  . Sodium 07/18/2013 136* 137 - 147 mEq/L Final  . Potassium 07/18/2013 4.4  3.7 - 5.3 mEq/L Final  . Chloride 07/18/2013 100  96 - 112 mEq/L Final  . CO2 07/18/2013 24  19 - 32 mEq/L  Final  . Glucose, Bld 07/18/2013 157* 70 - 99 mg/dL Final  . BUN 07/18/2013 11  6 - 23 mg/dL Final  . Creatinine, Ser 07/18/2013 1.01  0.50 - 1.35 mg/dL Final  . Calcium 07/18/2013 9.2  8.4 - 10.5 mg/dL Final  . Total Protein 07/18/2013 8.1  6.0 - 8.3 g/dL Final  . Albumin 07/18/2013 3.7  3.5 - 5.2 g/dL Final  . AST 07/18/2013 18  0 - 37 U/L Final  . ALT 07/18/2013 17  0 - 53 U/L Final  . Alkaline Phosphatase 07/18/2013 68  39 - 117 U/L Final  . Total Bilirubin 07/18/2013 0.2* 0.3 - 1.2 mg/dL Final  . GFR calc non Af Amer 07/18/2013 80* >90 mL/min Final  . GFR calc Af Amer 07/18/2013 >90  >90 mL/min Final   Comment: (NOTE)                          The eGFR has been calculated using the CKD EPI equation.                          This calculation has not been validated in all clinical situations.                          eGFR's persistently <90 mL/min signify possible Chronic Kidney  Disease.  . WBC 07/18/2013 9.7  4.0 - 10.5 K/uL Final  . RBC 07/18/2013 4.27  4.22 - 5.81 MIL/uL Final  . Hemoglobin 07/18/2013 13.5  13.0 - 17.0 g/dL Final  . HCT 07/18/2013 38.7* 39.0 - 52.0 % Final  . MCV 07/18/2013 90.6  78.0 - 100.0 fL Final  . MCH 07/18/2013 31.6  26.0 - 34.0 pg Final  . MCHC 07/18/2013 34.9  30.0 - 36.0 g/dL Final  . RDW 07/18/2013 12.7  11.5 - 15.5 % Final  . Platelets 07/18/2013 191  150 - 400 K/uL Final  . Neutrophils Relative % 07/18/2013 60  43 - 77 % Final  . Neutro Abs 07/18/2013 5.8  1.7 - 7.7 K/uL Final  . Lymphocytes Relative 07/18/2013 26  12 - 46 % Final  . Lymphs Abs 07/18/2013 2.6  0.7 - 4.0 K/uL Final  . Monocytes Relative 07/18/2013 8  3 - 12 % Final  . Monocytes Absolute 07/18/2013 0.8  0.1 - 1.0 K/uL Final  . Eosinophils Relative 07/18/2013 5  0 - 5 % Final  . Eosinophils Absolute 07/18/2013 0.4  0.0 - 0.7 K/uL Final  . Basophils Relative 07/18/2013 1  0 - 1 % Final  . Basophils Absolute 07/18/2013 0.1  0.0 - 0.1 K/uL Final   Insulin to  blood sugar ratio is 1.87 with normal being less than 0.25  PATHOLOGY: for DASHIEL, BERGQUIST ([id number]) Patient: NYAIRE, DENBLEYKER Collected: 06/24/2013 Client: [facility] Accession: [id number] Received: 06/25/2013 Farrel Gobble, MD DOB: 04-Jun-1956 Age: 58 Gender: M Reported: 06/25/2013 [address] Patient Ph: [phone] MRN #: [id number] Linna Hoff [place] [address] Visit #: [id number] Chart #: Phone: Fax: CC: FLOW CYTOMETRY REPORT INTERPRETATION Interpretation Peripheral Blood Flow Cytometry - NO MONOCLONAL B-CELL POPULATION OR ABNORMAL T-CELL PHENOTYPE IDENTIFIED. Susanne Greenhouse MD Pathologist, Electronic Signature (Case signed 06/25/2013) GROSS AND MICROSCOPIC INFORMATION Source Peripheral Blood Flow Cytometry Microscopic Gated population: Flow cytometric immunophenotyping is performed using antibiodies to the antigens listed in the table below. Electronic gates are placed around a cell cluster displaying light scatter properties corresponding to lymphocytes. - Abnormal Cells in gated population: NA - Phenotype of Abnormal Cells: NA   FDA. The analytical performance characteristics of    this test have been determined by [facility]. This    test may not be used for diagnostic, prognostic or monitoring purposes    without confirmation by other medically established means.    Performed at [facility] JAK2 genotypr (Order [id number])         JAK2 genotypr   Status: Final result Visible to patient: This result is not viewable by the patient. Next appt: 08/04/2013 at 08:40 AM in [facility], [place], F, MD) Dx: Neutrophilic leukocytosis           51moago    JAK2 GenotypR Not Detected     Comments: (NOTE) Clinical Utility: The somatic acquired mutation affecting Janus Tyrosine Kinase 2 (JAK2 V617F) in exon 14 is associated with myeloproliferative disorders (MPD). JAK2 V617F has been found  to be the most common molecular abnormality in patients with Polycythemia Vera (PV, >90%) or Essential Thombocythemia (ET, 35% - 70%). The lowest frequency is found in IMF patients (chronic Idiopathic Myelofibrosis, 50%). The presence of the JAK2 mutation causes activation of molecular signals that lead to proliferation of hematopoietic precursors outside  of their normal pathways including erythropoietin-independent erythroid colony growth in most patients with PV and some patients with ET. The JAK2 mutation is considered the main oncogenic event responsible for PV development but its precise role in ET and IMF remains questionable and may suggest the requirement of other genetic events to induce these pathologies. The absence of JAK2 V617F does not exclude other changes, including in the exon 12. Test Methodology: Patient DNA is assayed using allele specific PCR technology from [employer] and is tested using the Roche Light Cycler Real Time PCR analyzer. This assay is reported as detected when >5% of cells show the presence of the JAK2 V617F mutation. This test was performed using a kit that has not been cleared or approved by the FDA. The analytical performance characteristics of this test have been determined by [facility]. This test may not be used for diagnostic, prognostic or monitoring purposes without confirmation by other medically established means. Performed at [id number]   Specimen Collected: 06/24/13 4    Interpretation - BCRQ Bcr/abl gene rearrangement qnt, PCR       Collected: 06/24/13 1620   Resulting lab: [facility]   Value: REPORT   Comment: (NOTE)    The P190 and P210 BCR-ABL1 fusion transcripts are NOT    detected.    Reverse transcription real-time PCR is performed for    the P190 and P210 BCR-ABL1 transcripts associated with    the t(9;22) chromosomal translocation. For P190,    results are expressed as a percent  ratio of BCR-ABL1    to the ABL1 transcript, and further adjusted to the    international scale (IS) for P210.    Assay sensitivity is dependent on RNA quality and    sample cellularity but is usually at least 4-logs    below BCR-ABL1 baseline transcript levels. Reference    range is 0.000% BCR-ABL1/ABL1.    This test was developed and its performance    characteristics have been determined by [employer], [place], [place].    Performance characteristics refer to the    analytical performance of the test.        Brett Fairy, M.D., [facility], [place] Director, Molecular Oncology.    Performed at [facility]       Leukocyte Alkaline Phos Stain Leukocyte alkaline phosphatase       Collected: 06/27/13 1228   Resulting lab: [facility]   Reference range: 22 - 124   Value: 66   Comment: (NOTE)    INTERPRETIVE INFORMATION: Leukocyte Alkaline Phosphatase Smear    Access complete set of age- and/ or gender specific reference     intervals for this test in the [facility] Test Directory     ([email or web address]).    Performed     Urinalysis    Component Value Date/Time   COLORURINE YELLOW 10/19/2011 0609   APPEARANCEUR CLEAR 10/19/2011 0609   LABSPEC 1.019 10/19/2011 0609   PHURINE 7.5 10/19/2011 0609   GLUCOSEU NEGATIVE 10/19/2011 0609   HGBUR NEGATIVE 10/19/2011 0609   BILIRUBINUR NEGATIVE 10/19/2011 0609   KETONESUR NEGATIVE 10/19/2011 0609   PROTEINUR 30* 10/19/2011 0609   UROBILINOGEN 0.2 10/19/2011 0609   NITRITE NEGATIVE 10/19/2011 0609   LEUKOCYTESUR NEGATIVE 10/19/2011 0609    RADIOGRAPHIC STUDIES: Ct Abd Wo & W Cm  07/25/2013   CLINICAL DATA:  Symptoms concerning for insulinoma.  EXAM: CT ABDOMEN WITHOUT AND WITH CONTRAST  TECHNIQUE: Multidetector CT imaging of the abdomen was performed following the standard protocol before and following the bolus administration of intravenous contrast.  CONTRAST:  150m OMNIPAQUE IOHEXOL 300 MG/ML  SOLN   COMPARISON:  Renal ultrasound, 08/1911.  FINDINGS: Pancreas is normal in size and attenuation. It shows homogeneous enhancement. There is no pancreatic mass. There is no area of focal increased enhancement to suggest an insulinoma.  There are small lung base nodules, 2 calcified an 1 noncalcified all likely granulomas. There is lung base coarse reticular opacity consistent with a combination of atelectasis and scarring. Minimal pleural fluid is noted on the right. There is a small effusion on the left. Changes from cardiac surgery and mitral valve replacement are noted  There small liver calcifications consistent with healed granuloma. Liver is otherwise unremarkable. Normal spleen, gallbladder and adrenal glands. No bile duct dilation. Kidneys are unremarkable.  There is atherosclerotic plaque along a normal caliber abdominal aorta and iliac arteries.  No adenopathy. No abnormal fluid collections. Visualized bowel is unremarkable.  No osteoblastic or osteolytic lesions.  IMPRESSION: 1. No evidence of an insulinoma.  No pancreatic abnormality. 2. Small left a minimal right pleural effusions. No evidence of pulmonary edema. 3. Changes of healed granulomatous disease. Changes from previous cardiac surgery and mitral valve replacement. Mild lung base subsegmental atelectasis/scarring. 4. Exam otherwise unremarkable.   Electronically Signed   By: DLajean ManesM.D.   On: 07/25/2013 14:55    ASSESSMENT:  #1. Reactive neutrophilic leukocytosis. #2. 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 However the determination was not done in the fasting state. #3. Coronary artery disease, status post sextuple bypass with valve replacement, continuing to smoke.  #4. Peripheral vascular disease, status post endarterectomy.  #5. History of thrombotic stroke, no residual neurologic deficit.  #6. History of ventricular dysrhythmia, defibrillator in place.    PLAN:  #1. No further workup  is necessary for leukocytosis. #2. Patient is willing to continue to pursue workup in this office for possible insulinoma. With that in mind, fasting lab work will be done on the morning of 08/04/2013 2 include gastrin, prolactin, and PTH level to rule out Men1 syndrome, C-peptide to immunoreactive insulin ratio, pro insulin level, and insulin to C-peptide ratio. If these tests suggest an ectopic insulinoma, additional scanning will be done that is more specific for determining the location of a singular or multiple insulinoma. #3. The patient was told that should he start developing a symptom complex after fasting over Sunday night, he should take a small amount of food to LA the symptoms. #4. Followup in 2 weeks.   All questions were answered. The patient knows to call the clinic with any problems, questions or concerns. We can certainly see the patient much sooner if necessary.   I spent 25 minutes counseling the patient face to face. The total time spent in the appointment was 30 minutes.    FDoroteo Bradford MD 08/01/2013 2:43 PM

## 2013-08-03 NOTE — Progress Notes (Signed)
Clinical Summary [name] is a 58 y.o.male former patient of Dr Charles Savage, this is our first visit together. He is seen for the following medical problems.  1. CAD/ICM -  Echo 11/2011 LVEF 15-20%,  - CABG 09/2011 x 6 vessels (LIMA-LAD with sequential SVG to OM1, OM2, OM3. SVG-PDA o LCX, SVG to RCA.  - he has an ICD, Medtronic dual chamber ,that is followed by EP Dr. Lovena Savage. Normal function on last check 05/2013.   - no chest pain. <1/2 block DOE which is stable, no orthopnea, no PND, no Savage edema - compliant with meds. Limiting sodium. Takes mobic for back pain.   2. Hyperlipidemia - changed from simva to atorva 11/2012, reports he did not tolerate lipitor because of joint pain. Placed back on simva.   3. Carotid stenosis - history of prior CEA 07/2011 - followed by vascular  4. COPD  - off home O2,   5. Mitral regurgitation - prior repair 09/2011 with 28 mm annuloplasty ring, last echo 2013 with trivial regurgitation  6. Hx of CVA - on ASA and statin for secondary prevention  7. Fatigue - being worked up for possible insulinoma Past Medical History  Diagnosis Date  . Cerebrovascular disease     h/o CVA in 07/2011 + left carotid endarterectomy  . Hyperlipemia   . Pneumonia   . COPD (chronic obstructive pulmonary disease)     on 2L O2 at home since 08/2011  . 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 ICD (implantable cardiac defibrillator) in place   . Coronary artery disease   . CHF (congestive heart failure)   . Myocardial infarction 08/2011  . Stroke 07/2011    denies residual (06/14/2012)  . Chronic lower back pain   . Arthritis     "back; knees" (06/14/2012)  . DDD (degenerative disc disease), lumbosacral   . Depression 09/2011    "after OHS" (06/14/2012)  . Cholelithiasis 07/05/2012    Asymptomatic; identified on CT scanning of the chest;  . Other primary cardiomyopathies      Allergies  Allergen  Reactions  . Metoprolol Shortness Of Breath and Nausea And Vomiting  . Peach Flavor Hives and Itching    "peaches; peach flavoring; actually happened after he ate a store-bought peach pie"  . Morphine And Related Hives and Itching     Current Outpatient Prescriptions  Medication Sig Dispense Refill  . aspirin 81 MG tablet Take 81 mg by mouth daily.      . carvedilol (COREG) 12.5 MG tablet Take 1 tablet (12.5 mg total) by mouth 2 (two) times daily.  180 tablet  3  . docusate sodium (COLACE) 100 MG capsule Take 100 mg by mouth daily.      Marland Kitchen EPIPEN 2-PAK 0.3 MG/0.3ML SOAJ injection       . escitalopram (LEXAPRO) 20 MG tablet Take 20 mg by mouth daily.      . furosemide (LASIX) 20 MG tablet Take one half tablet by mouth daily  30 tablet  2  . HYDROcodone-acetaminophen (NORCO) 10-325 MG per tablet Take 1 tablet by mouth every 8 (eight) hours as needed. Pain  21 tablet  0  . lisinopril (PRINIVIL,ZESTRIL) 5 MG tablet Take 1 tablet (5 mg total) by mouth daily.  30 tablet  6  . meloxicam (MOBIC) 15 MG tablet Take 15 mg by mouth daily.      . simvastatin (ZOCOR) 40 MG tablet Take 1  tablet (40 mg total) by mouth at bedtime.  30 tablet  6   No current facility-administered medications for this visit.     Past Surgical History  Procedure Laterality Date  . Back surgery    . Knee arthroscopy  2002    "right" (06/14/2012)  . Foreign body removal  06/05/2011    Procedure: FOREIGN BODY REMOVAL ADULT;  Surgeon: Hermelinda Dellen;  Location: [facility];  Service: Plastics;  Laterality: Right;  removal foreign body from right side face   . Carotid endarterectomy  07/2011    "left" (06/14/2012)  . Tee without cardioversion  10/17/2011    Procedure: TRANSESOPHAGEAL ECHOCARDIOGRAM (TEE);  Surgeon: Jolaine Artist, MD;  Location: [facility] ENDOSCOPY;  Service: Cardiovascular;  Laterality: N/A;  . Coronary artery bypass graft  10/20/2011    Procedure: CORONARY ARTERY BYPASS GRAFTING (CABG);   Surgeon: [name], MD;  Location: [facility];  Service: Open Heart Surgery;  Laterality: N/A;  CABG x six;  using left internal mammary artery and right leg greater saphenous vein harvested endoscopically  . Mitral valve repair  10/20/2011    Procedure: MITRAL VALVE REPAIR (MVR);  Surgeon: [name], MD;  Location: [facility];  Service: Open Heart Surgery;  Laterality: N/A;  . Cardiac defibrillator placement  06/14/2012  . Posterior lumbar fusion  1996  . Lumbar disc surgery  2012  . Cardiac catheterization  09/2011     Allergies  Allergen Reactions  . Metoprolol Shortness Of Breath and Nausea And Vomiting  . Peach Flavor Hives and Itching    "peaches; peach flavoring; actually happened after he ate a store-bought peach pie"  . Morphine And Related Hives and Itching      Family History  Problem Relation Age of Onset  . Coronary artery disease Mother   . Coronary artery disease Father   . Arrhythmia Brother      Social History [name] reports that he has been smoking Cigarettes.  He has a 63 pack-year smoking history. He has never used smokeless tobacco. [name] reports that he does not drink alcohol.   Review of Systems CONSTITUTIONAL: No weight loss, fever, chills, weakness or fatigue.  HEENT: Eyes: No visual loss, blurred vision, double vision or yellow sclerae.No hearing loss, sneezing, congestion, runny nose or sore throat.  SKIN: No rash or itching.  CARDIOVASCULAR:  RESPIRATORY: No shortness of breath, cough or sputum.  GASTROINTESTINAL: No anorexia, nausea, vomiting or diarrhea. No abdominal pain or blood.  GENITOURINARY: No burning on urination, no polyuria NEUROLOGICAL: No headache, dizziness, syncope, paralysis, ataxia, numbness or tingling in the extremities. No change in bowel or bladder control.  MUSCULOSKELETAL: No muscle, back pain, joint pain or stiffness.  LYMPHATICS: No enlarged nodes. No history of splenectomy.  PSYCHIATRIC: No history of  depression or anxiety.  ENDOCRINOLOGIC: No reports of sweating, cold or heat intolerance. No polyuria or polydipsia.  Marland Kitchen   Physical Examination p 96 bp 120/64 Wt 217 lbs BMI 30 Gen: resting comfortably, no acute distress HEENT: no scleral icterus, pupils equal round and reactive, no palptable cervical adenopathy,  CV: RRR, no m/r/g, no JVD, no carotid bruits Resp: Clear to auscultation bilaterally GI: abdomen is soft, non-tender, non-distended, normal bowel sounds, no hepatosplenomegaly MSK: extremities are warm, no edema.  Skin: warm, no rash Neuro:  no focal deficits Psych: appropriate affect   Diagnostic Studies 11/2011 Echo  LVEF 15-20%, anular MV ring with trivial MR, severe RV dysfunction,   09/2011  TEE LEFT VENTRICLE: EF = 25% Mildly dilated. Global HK.   RIGHT VENTRICLE: Moderately HK  LEFT ATRIUM: Moderately dilated  LEFT ATRIAL APPENDAGE: No thrombus  RIGHT ATRIUM: Normal  AORTIC VALVE: Trileaflet. Trivial AI. No AS.  MITRAL VALVE: Structurally normal. Mild to moderate MR.  TRICUSPID VALVE: Normal Trivial TR  PULMONIC VALVE: Normal  INTERATRIAL SEPTUM: No ASD or PFO.  PERICARDIUM: Moderate effusion along RA//RV. No tamponade.  DESCENDING AORTA: Severe plaque.   09/2011 Cath Findings:  On milrinone 0.25 mg.kg/min  RA = 5  RV = 30/5/7  PA = 30/16 (23)  PCW = 20 (no significant v-waves)  Fick cardiac output/index = 5.46/2.8  PVR = 0.5 [place]  SVR = 981  FA sat = 93%  PA sat = 67%, 71% (on milrinone)  Ao Pressure: 92/63 (76)  LV Pressure: 98/8/18  There was no signficant gradient across the aortic valve on pullback.  Left main: 80-90% distal  LAD: Flush occlusion at ostium. Mild filling in mid and distal segments through L to L and R to L collaterals  LCX: Large. Dominant. 70-80% mid AV groove. OM-1 small to moderate vessel with mild ostial disease. OM-2 very large 99% lesion in mid section at trifurcation. 90% lesion in lowest Daundre Biel. PDA 50-60 ostial  RCA:  Small to moderate-sized, non- dominant vessel ending with acute marginal Patsy Zaragoza. Diffuse 60% prox to mid. 95% midsection. R to L collats to LAD from acute marginal.  LV-gram done in the RAO projection: Ejection fraction = 25-30% with global HK 3+ MR  L subclavian: Widely patent to chest wall with mild plaquing  Assessment:  1. 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 Ischemic CM with EF 25-30%  3. 3+ mitral regurgitation  4. Well compensated hemodynamics on milrinone  Plan/Discussion:  Will need CABG/MVR. Check PFTs and TEE. Continue milrinone. Transfer stepdown. Consult TCTS.     Assessment and Plan  1. CAD/ICM - LVEF 15-20% by echo 11/2011, NYHA III, he has a Medtronic ICD - titration of medications has been limited by low blood pressures per notes, bp looks good today. Will increase coreg to 12.5mg  in AM and 25mg  at night - counseled to cut down on mobic use  2. Hyperlipidemia - reports joints pains when changed to crestor recently, continue simva. Will request recent panel from PCP  3. Carotid stenosis - prior CEA, defer surveillance imaging to vascular. At next visit will assess if he has appropriate follow up.  4. COPD - describes symptoms worst in the cold, gave Rx for prn albuterol. Defer further management to PCP  5. Mitral regurgitation - s/p prior valve repair, repeat echo showed no signficant regurgitation - continue to follow clinically  6. Hx of CVA - ASA, statin, ACE-I for secondary prevention   Follow up 1 month for further medication titration in the setting of systolic dysfunction.    Arnoldo Lenis, M.D., F.A.C.C.

## 2013-08-04 ENCOUNTER — Ambulatory Visit (INDEPENDENT_AMBULATORY_CARE_PROVIDER_SITE_OTHER): Payer: Medicare Other | Admitting: Cardiology

## 2013-08-04 ENCOUNTER — Encounter: Payer: Self-pay | Admitting: Cardiology

## 2013-08-04 ENCOUNTER — Encounter (HOSPITAL_BASED_OUTPATIENT_CLINIC_OR_DEPARTMENT_OTHER): Payer: Medicare Other

## 2013-08-04 VITALS — BP 120/64 | HR 96 | Ht 71.0 in | Wt 217.0 lb

## 2013-08-04 DIAGNOSIS — I6529 Occlusion and stenosis of unspecified carotid artery: Secondary | ICD-10-CM

## 2013-08-04 DIAGNOSIS — E162 Hypoglycemia, unspecified: Secondary | ICD-10-CM

## 2013-08-04 DIAGNOSIS — I5022 Chronic systolic (congestive) heart failure: Secondary | ICD-10-CM

## 2013-08-04 DIAGNOSIS — D729 Disorder of white blood cells, unspecified: Secondary | ICD-10-CM

## 2013-08-04 DIAGNOSIS — I2510 Atherosclerotic heart disease of native coronary artery without angina pectoris: Secondary | ICD-10-CM

## 2013-08-04 DIAGNOSIS — E785 Hyperlipidemia, unspecified: Secondary | ICD-10-CM

## 2013-08-04 DIAGNOSIS — J449 Chronic obstructive pulmonary disease, unspecified: Secondary | ICD-10-CM

## 2013-08-04 LAB — CBC WITH DIFFERENTIAL/PLATELET
BASOS ABS: 0.1 10*3/uL (ref 0.0–0.1)
BASOS PCT: 1 % (ref 0–1)
EOS PCT: 4 % (ref 0–5)
Eosinophils Absolute: 0.4 10*3/uL (ref 0.0–0.7)
HEMATOCRIT: 41.1 % (ref 39.0–52.0)
Hemoglobin: 14.4 g/dL (ref 13.0–17.0)
Lymphocytes Relative: 24 % (ref 12–46)
Lymphs Abs: 2.5 10*3/uL (ref 0.7–4.0)
MCH: 31.9 pg (ref 26.0–34.0)
MCHC: 35 g/dL (ref 30.0–36.0)
MCV: 90.9 fL (ref 78.0–100.0)
MONO ABS: 0.9 10*3/uL (ref 0.1–1.0)
Monocytes Relative: 9 % (ref 3–12)
NEUTROS ABS: 6.7 10*3/uL (ref 1.7–7.7)
Neutrophils Relative %: 63 % (ref 43–77)
Platelets: 318 10*3/uL (ref 150–400)
RBC: 4.52 MIL/uL (ref 4.22–5.81)
RDW: 13.3 % (ref 11.5–15.5)
WBC: 10.5 10*3/uL (ref 4.0–10.5)

## 2013-08-04 LAB — COMPREHENSIVE METABOLIC PANEL
ALBUMIN: 4 g/dL (ref 3.5–5.2)
ALK PHOS: 65 U/L (ref 39–117)
ALT: 16 U/L (ref 0–53)
AST: 17 U/L (ref 0–37)
BILIRUBIN TOTAL: 0.4 mg/dL (ref 0.3–1.2)
BUN: 17 mg/dL (ref 6–23)
CHLORIDE: 100 meq/L (ref 96–112)
CO2: 29 mEq/L (ref 19–32)
Calcium: 9.5 mg/dL (ref 8.4–10.5)
Creatinine, Ser: 1.17 mg/dL (ref 0.50–1.35)
GFR calc Af Amer: 78 mL/min — ABNORMAL LOW (ref >90)
GFR calc non Af Amer: 67 mL/min — ABNORMAL LOW (ref >90)
Glucose, Bld: 122 mg/dL — ABNORMAL HIGH (ref 70–99)
POTASSIUM: 4.9 meq/L (ref 3.7–5.3)
Sodium: 139 mEq/L (ref 137–147)
Total Protein: 8.4 g/dL — ABNORMAL HIGH (ref 6.0–8.3)

## 2013-08-04 LAB — PROLACTIN: PROLACTIN: 4.9 ng/mL (ref 2.1–17.1)

## 2013-08-04 MED ORDER — CARVEDILOL 12.5 MG PO TABS: ORAL_TABLET | ORAL | Status: DC

## 2013-08-04 MED ORDER — ALBUTEROL SULFATE HFA 108 (90 BASE) MCG/ACT IN AERS: 1.0000 | INHALATION_SPRAY | Freq: Four times a day (QID) | RESPIRATORY_TRACT | Status: DC | PRN

## 2013-08-04 NOTE — Patient Instructions (Signed)
Your physician recommends that you schedule a follow-up appointment in: 1 month with Dr Harl Bowie  Your physician has recommended you make the following change in your medication:  Increase coreg to 12.5 mg in the morning and 25 mg at night. Start Albuterol in haler take 1 pull every 6 hours as needed

## 2013-08-04 NOTE — Progress Notes (Signed)
Labs drawn today for gastrin,Insulin,c-peptide,proinsulin/insulin ratio,pth intact,cmp,prolactin,cbc/diff

## 2013-08-05 ENCOUNTER — Encounter: Payer: Self-pay | Admitting: Cardiology

## 2013-08-05 LAB — INSULIN, RANDOM: Insulin: 49 u[IU]/mL — ABNORMAL HIGH (ref 3–28)

## 2013-08-05 LAB — PTH, INTACT AND CALCIUM
Calcium, Total (PTH): 9.3 mg/dL (ref 8.4–10.5)
PTH: 24.5 pg/mL (ref 14.0–72.0)

## 2013-08-05 LAB — C-PEPTIDE: C-Peptide: 5.46 ng/mL — ABNORMAL HIGH (ref 0.80–3.90)

## 2013-08-07 ENCOUNTER — Telehealth (HOSPITAL_COMMUNITY): Payer: Self-pay

## 2013-08-07 LAB — GASTRIN: Gastrin: <15 pg/mL (ref <=100)

## 2013-08-07 NOTE — Telephone Encounter (Signed)
Per Enid Derry, pro-insulin was sent to referral lab correctly but something happened at the referral lab and sample will need to be re-submitted.

## 2013-08-13 ENCOUNTER — Encounter (HOSPITAL_COMMUNITY): Payer: Self-pay

## 2013-08-13 ENCOUNTER — Other Ambulatory Visit (HOSPITAL_COMMUNITY): Payer: Self-pay

## 2013-08-13 DIAGNOSIS — E162 Hypoglycemia, unspecified: Secondary | ICD-10-CM

## 2013-08-15 ENCOUNTER — Ambulatory Visit (HOSPITAL_COMMUNITY): Payer: Medicare Other

## 2013-08-15 ENCOUNTER — Encounter (HOSPITAL_BASED_OUTPATIENT_CLINIC_OR_DEPARTMENT_OTHER): Payer: Medicare Other

## 2013-08-15 DIAGNOSIS — E162 Hypoglycemia, unspecified: Secondary | ICD-10-CM

## 2013-08-15 LAB — COMPREHENSIVE METABOLIC PANEL
ALT: 16 U/L (ref 0–53)
AST: 20 U/L (ref 0–37)
Albumin: 4 g/dL (ref 3.5–5.2)
Alkaline Phosphatase: 72 U/L (ref 39–117)
BILIRUBIN TOTAL: 0.4 mg/dL (ref 0.3–1.2)
BUN: 16 mg/dL (ref 6–23)
CHLORIDE: 101 meq/L (ref 96–112)
CO2: 29 meq/L (ref 19–32)
CREATININE: 1.1 mg/dL (ref 0.50–1.35)
Calcium: 9.2 mg/dL (ref 8.4–10.5)
GFR calc Af Amer: 84 mL/min — ABNORMAL LOW (ref >90)
GFR, EST NON AFRICAN AMERICAN: 72 mL/min — AB (ref >90)
Glucose, Bld: 130 mg/dL — ABNORMAL HIGH (ref 70–99)
Potassium: 4.6 mEq/L (ref 3.7–5.3)
Sodium: 140 mEq/L (ref 137–147)
Total Protein: 8.9 g/dL — ABNORMAL HIGH (ref 6.0–8.3)

## 2013-08-15 NOTE — Progress Notes (Signed)
Labs drawn and sent to lab. Pt. Tolerated well.

## 2013-08-16 LAB — C-PEPTIDE: C-Peptide: 3.95 ng/mL — ABNORMAL HIGH (ref 0.80–3.90)

## 2013-08-22 ENCOUNTER — Encounter (HOSPITAL_COMMUNITY): Payer: Self-pay

## 2013-08-22 ENCOUNTER — Encounter (HOSPITAL_BASED_OUTPATIENT_CLINIC_OR_DEPARTMENT_OTHER): Payer: Medicare Other

## 2013-08-22 VITALS — BP 93/58 | HR 72 | Temp 97.8°F | Resp 18 | Wt 217.6 lb

## 2013-08-22 DIAGNOSIS — Z8673 Personal history of transient ischemic attack (TIA), and cerebral infarction without residual deficits: Secondary | ICD-10-CM

## 2013-08-22 DIAGNOSIS — D72829 Elevated white blood cell count, unspecified: Secondary | ICD-10-CM

## 2013-08-22 DIAGNOSIS — D729 Disorder of white blood cells, unspecified: Secondary | ICD-10-CM

## 2013-08-22 DIAGNOSIS — R7309 Other abnormal glucose: Secondary | ICD-10-CM

## 2013-08-22 DIAGNOSIS — E162 Hypoglycemia, unspecified: Secondary | ICD-10-CM

## 2013-08-22 DIAGNOSIS — I739 Peripheral vascular disease, unspecified: Secondary | ICD-10-CM

## 2013-08-22 LAB — PROINSULIN/INSULIN RATIO
INSULIN: 49 u[IU]/mL — AB (ref 3–19)
PROINSULIN: 16.2 pmol/L — AB (ref <=8.0)
Proinsulin/Insulin Ratio: 5.5 % (ref 0.8–21.7)

## 2013-08-22 NOTE — Progress Notes (Signed)
[facility]  OFFICE PROGRESS NOTE  Purvis Kilts, MD [address] [place] [address]  DIAGNOSIS: Hypoglycemia, reactive  Neutrophilic leukocytosis  Chief Complaint  Patient presents with  . Reactive leukocytosis  . Reactive hypoglycemia    CURRENT THERAPY: Currently undergoing workup for signs and symptoms suggestive of hypoglycemia  INTERVAL HISTORY: Charles Savage 58 y.o. male returns for followup after additional fasting testing has been accomplished for explanation of signs and symptoms suggestive of hypoglycemia.  He was seen by his family physician last week and started on a Z-Pak for sinus infection. He's had no further episodes of postprandial diaphoresis and weakness that is relieved by 18 5 or 6 hours after having finished a medial. He denies a fever, night sweats, easy satiety, lower extremity swelling or redness, PND, orthopnea, palpitations, headache, or seizures.  MEDICAL HISTORY: Past Medical History  Diagnosis Date  . Cerebrovascular disease     h/o CVA in 07/2011 + left carotid endarterectomy  . Hyperlipemia   . Pneumonia   . COPD (chronic obstructive pulmonary disease)     on 2L O2 at home since 08/2011  . 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 ICD (implantable cardiac defibrillator) in place   . Coronary artery disease   . CHF (congestive heart failure)   . Myocardial infarction 08/2011  . Stroke 07/2011    denies residual (06/14/2012)  . Chronic lower back pain   . Arthritis     "back; knees" (06/14/2012)  . DDD (degenerative disc disease), lumbosacral   . Depression 09/2011    "after OHS" (06/14/2012)  . Cholelithiasis 07/05/2012    Asymptomatic; identified on CT scanning of the chest;  . Other primary cardiomyopathies     INTERIM HISTORY: has Hyperlipemia; Lumbar disc disease; Cerebrovascular disease; Tobacco  abuse, in remission; Arteriosclerotic cardiovascular disease (ASCVD); COPD (chronic obstructive pulmonary disease); History of diagnostic tests; ICD (implantable cardioverter-defibrillator) in place; Cholelithiasis; Chronic systolic heart failure; Neutrophilic leukocytosis; and Hypoglycemia, suspected by symptomatology on his problem list.    ALLERGIES:  is allergic to metoprolol; peach flavor; and morphine and related.  MEDICATIONS: has a current medication list which includes the following prescription(s): albuterol, aspirin, azithromycin, carvedilol, dextromethorphan, docusate sodium, escitalopram, furosemide, hydrocodone-acetaminophen, lisinopril, meloxicam, simvastatin, and epipen 2-pak.  SURGICAL HISTORY:  Past Surgical History  Procedure Laterality Date  . Back surgery    . Knee arthroscopy  2002    "right" (06/14/2012)  . Foreign body removal  06/05/2011    Procedure: FOREIGN BODY REMOVAL ADULT;  Surgeon: Hermelinda Dellen;  Location: [facility];  Service: Plastics;  Laterality: Right;  removal foreign body from right side face   . Carotid endarterectomy  07/2011    "left" (06/14/2012)  . Tee without cardioversion  10/17/2011    Procedure: TRANSESOPHAGEAL ECHOCARDIOGRAM (TEE);  Surgeon: Jolaine Artist, MD;  Location: [facility] ENDOSCOPY;  Service: Cardiovascular;  Laterality: N/A;  . Coronary artery bypass graft  10/20/2011    Procedure: CORONARY ARTERY BYPASS GRAFTING (CABG);  Surgeon: [name], MD;  Location: [facility];  Service: Open Heart Surgery;  Laterality: N/A;  CABG x six;  using left internal mammary artery and right leg greater saphenous vein harvested endoscopically  . Mitral valve repair  10/20/2011    Procedure: MITRAL VALVE REPAIR (MVR);  Surgeon: [name], MD;  Location: [facility];  Service: Open Heart Surgery;  Laterality: N/A;  . Cardiac defibrillator placement  06/14/2012  . Posterior lumbar fusion  1996  . Lumbar disc surgery  2012  . Cardiac  catheterization  09/2011    FAMILY HISTORY: family history includes Arrhythmia in his brother; Coronary artery disease in his father and mother.  SOCIAL HISTORY:  reports that he has been smoking Cigarettes.  He has a 63 pack-year smoking history. He has never used smokeless tobacco. He reports that he does not drink alcohol or use illicit drugs.  REVIEW OF SYSTEMS:  Other than that discussed above is noncontributory.  PHYSICAL EXAMINATION: ECOG PERFORMANCE STATUS: 1 - Symptomatic but completely ambulatory  Blood pressure 93/58, pulse 72, temperature 97.8 F (36.6 C), temperature source Oral, resp. rate 18, weight 217 lb 9.6 oz (98.703 kg).  GENERAL:alert, no distress and comfortable SKIN: skin color, texture, turgor are normal, no rashes or significant lesions EYES: PERLA; Conjunctiva are pink and non-injected, sclera clear OROPHARYNX:no exudate, no erythema on lips, buccal mucosa, or tongue. NECK: supple, thyroid normal size, non-tender, without nodularity. No masses CHEST: Increased AP diameter with no breast masses. The defibrillator in place. Midline sternotomy scar well healed. LYMPH:  no palpable lymphadenopathy in the cervical, axillary or inguinal LUNGS: clear to auscultation and percussion with normal breathing effort HEART: regular rate & rhythm and no murmurs. ABDOMEN:abdomen soft, non-tender and normal bowel sounds MUSCULOSKELETAL:no cyanosis of digits and no clubbing. Range of motion normal.  NEURO: alert & oriented x 3 with fluent speech, no focal motor/sensory deficits   LABORATORY DATA: Infusion on 08/15/2013  Component Date Value Ref Range Status  . C-Peptide 08/15/2013 3.95* 0.80 - 3.90 ng/mL Final   Performed at [facility]  . Sodium 08/15/2013 140  137 - 147 mEq/L Final  . Potassium 08/15/2013 4.6  3.7 - 5.3 mEq/L Final  . Chloride 08/15/2013 101  96 - 112 mEq/L Final  . CO2 08/15/2013 29  19 - 32 mEq/L Final  . Glucose, Bld 08/15/2013 130* 70 - 99  mg/dL Final  . BUN 08/15/2013 16  6 - 23 mg/dL Final  . Creatinine, Ser 08/15/2013 1.10  0.50 - 1.35 mg/dL Final  . Calcium 08/15/2013 9.2  8.4 - 10.5 mg/dL Final  . Total Protein 08/15/2013 8.9* 6.0 - 8.3 g/dL Final  . Albumin 08/15/2013 4.0  3.5 - 5.2 g/dL Final  . AST 08/15/2013 20  0 - 37 U/L Final  . ALT 08/15/2013 16  0 - 53 U/L Final  . Alkaline Phosphatase 08/15/2013 72  39 - 117 U/L Final  . Total Bilirubin 08/15/2013 0.4  0.3 - 1.2 mg/dL Final  . GFR calc non Af Amer 08/15/2013 72* >90 mL/min Final  . GFR calc Af Amer 08/15/2013 84* >90 mL/min Final   Comment: (NOTE)                          The eGFR has been calculated using the CKD EPI equation.                          This calculation has not been validated in all clinical situations.                          eGFR's persistently <90 mL/min signify possible Chronic Kidney  Disease.  . Proinsulin 08/15/2013 16.2* <=8.0 pmol/L Final   Comment: (NOTE)                          INTERPRETIVE INFORMATION: Proinsulin, Intact                          Proinsulin, Intact: Fasting intact proinsulin values above the                           reference interval indicate a possible insulin secreting                           pancreatic tumor (insulinoma) in patients with hypoglycemia.                           Fasting intact proinsulin values range from 3 to 50 pmol/L in                           patients with untreated type 2 diabetes.  . Insulin 08/15/2013 49* 3 - 19 uIU/mL Final   Comment: (NOTE)                          INTERPRETIVE INFORMATION: Insulin, Fasting                          This test reacts on a nearly equimolar basis with the analogs                           insulin aspart, insulin glargine, and insulin lispro.  Insulin                           detemir exhibits approximately 50 percent cross-reactivity.  Test                           reactivity with insulin glulisine is negligible (less than 3                            percent). To convert to pmol/L, multiply uIU/mL by 6.0.  . Proinsulin/Insulin Ratio 08/15/2013 5.5  0.8 - 21.7 % Final   Performed at [facility] on 08/04/2013  Component Date Value Ref Range Status  . PTH 08/04/2013 24.5  14.0 - 72.0 pg/mL Final  . Calcium, Total (PTH) 08/04/2013 9.3  8.4 - 10.5 mg/dL Final   Performed at [facility]  . Sodium 08/04/2013 139  137 - 147 mEq/L Final  . Potassium 08/04/2013 4.9  3.7 - 5.3 mEq/L Final  . Chloride 08/04/2013 100  96 - 112 mEq/L Final  . CO2 08/04/2013 29  19 - 32 mEq/L Final  . Glucose, Bld 08/04/2013 122* 70 - 99 mg/dL Final  . BUN 08/04/2013 17  6 - 23 mg/dL Final  . Creatinine, Ser 08/04/2013 1.17  0.50 - 1.35 mg/dL Final  . Calcium 08/04/2013 9.5  8.4 - 10.5 mg/dL Final  . Total Protein 08/04/2013 8.4* 6.0 - 8.3 g/dL Final  . Albumin 08/04/2013 4.0  3.5 - 5.2 g/dL  Final  . AST 08/04/2013 17  0 - 37 U/L Final  . ALT 08/04/2013 16  0 - 53 U/L Final  . Alkaline Phosphatase 08/04/2013 65  39 - 117 U/L Final  . Total Bilirubin 08/04/2013 0.4  0.3 - 1.2 mg/dL Final  . GFR calc non Af Amer 08/04/2013 67* >90 mL/min Final  . GFR calc Af Amer 08/04/2013 78* >90 mL/min Final   Comment: (NOTE)                          The eGFR has been calculated using the CKD EPI equation.                          This calculation has not been validated in all clinical situations.                          eGFR's persistently <90 mL/min signify possible Chronic Kidney                          Disease.  . Prolactin 08/04/2013 4.9  2.1 - 17.1 ng/mL Final   Comment: (NOTE)                              Reference Ranges:                                          Male:                       2.1 -  17.1 ng/ml                                          Male:   Pregnant          9.7 - 208.5 ng/mL                                                    Non Pregnant      2.8 -  29.2 ng/mL                                                     Post Menopausal   1.8 -  20.3 ng/mL                                                                     Performed at [facility]  . WBC 08/04/2013 10.5  4.0 - 10.5 K/uL Final  . RBC 08/04/2013 4.52  4.22 - 5.81 MIL/uL Final  . Hemoglobin 08/04/2013 14.4  13.0 - 17.0 g/dL Final  . HCT 08/04/2013 41.1  39.0 - 52.0 % Final  . MCV 08/04/2013 90.9  78.0 - 100.0 fL Final  . MCH 08/04/2013 31.9  26.0 - 34.0 pg Final  . MCHC 08/04/2013 35.0  30.0 - 36.0 g/dL Final  . RDW 08/04/2013 13.3  11.5 - 15.5 % Final  . Platelets 08/04/2013 318  150 - 400 K/uL Final  . Neutrophils Relative % 08/04/2013 63  43 - 77 % Final  . Neutro Abs 08/04/2013 6.7  1.7 - 7.7 K/uL Final  . Lymphocytes Relative 08/04/2013 24  12 - 46 % Final  . Lymphs Abs 08/04/2013 2.5  0.7 - 4.0 K/uL Final  . Monocytes Relative 08/04/2013 9  3 - 12 % Final  . Monocytes Absolute 08/04/2013 0.9  0.1 - 1.0 K/uL Final  . Eosinophils Relative 08/04/2013 4  0 - 5 % Final  . Eosinophils Absolute 08/04/2013 0.4  0.0 - 0.7 K/uL Final  . Basophils Relative 08/04/2013 1  0 - 1 % Final  . Basophils Absolute 08/04/2013 0.1  0.0 - 0.1 K/uL Final  . C-Peptide 08/04/2013 5.46* 0.80 - 3.90 ng/mL Final   Performed at [facility]  . Gastrin 08/04/2013 <15  <=100 pg/mL Final   Comment: Reference range applies to fasting specimens only.                          Performed at [facility]  . Insulin 08/04/2013 49* 3 - 28 uIU/mL Final   Performed at [facility]: No new pathology.  Urinalysis    Component Value Date/Time   COLORURINE YELLOW 10/19/2011 0609   APPEARANCEUR CLEAR 10/19/2011 0609   LABSPEC 1.019 10/19/2011 0609   PHURINE 7.5 10/19/2011 0609   GLUCOSEU NEGATIVE 10/19/2011 0609   HGBUR NEGATIVE 10/19/2011 0609   BILIRUBINUR NEGATIVE 10/19/2011 0609   KETONESUR NEGATIVE 10/19/2011 0609   PROTEINUR 30* 10/19/2011 0609   UROBILINOGEN 0.2 10/19/2011 0609   NITRITE NEGATIVE 10/19/2011 0609    LEUKOCYTESUR NEGATIVE 10/19/2011 0609    RADIOGRAPHIC STUDIES: Ct Abd Wo & W Cm  07/25/2013   CLINICAL DATA:  Symptoms concerning for insulinoma.  EXAM: CT ABDOMEN WITHOUT AND WITH CONTRAST  TECHNIQUE: Multidetector CT imaging of the abdomen was performed following the standard protocol before and following the bolus administration of intravenous contrast.  CONTRAST:  165m OMNIPAQUE IOHEXOL 300 MG/ML  SOLN  COMPARISON:  Renal ultrasound, 08/1911.  FINDINGS: Pancreas is normal in size and attenuation. It shows homogeneous enhancement. There is no pancreatic mass. There is no area of focal increased enhancement to suggest an insulinoma.  There are small lung base nodules, 2 calcified an 1 noncalcified all likely granulomas. There is lung base coarse reticular opacity consistent with a combination of atelectasis and scarring. Minimal pleural fluid is noted on the right. There is a small effusion on the left. Changes from cardiac surgery and mitral valve replacement are noted  There small liver calcifications consistent with healed granuloma. Liver is otherwise unremarkable. Normal spleen, gallbladder and adrenal glands. No bile duct dilation. Kidneys are unremarkable.  There is atherosclerotic plaque along a normal caliber abdominal aorta and iliac arteries.  No adenopathy. No abnormal fluid collections. Visualized bowel is unremarkable.  No osteoblastic or osteolytic lesions.  IMPRESSION: 1. No evidence of an insulinoma.  No pancreatic abnormality. 2. Small left a minimal right pleural effusions. No evidence of pulmonary edema. 3. Changes  of healed granulomatous disease. Changes from previous cardiac surgery and mitral valve replacement. Mild lung base subsegmental atelectasis/scarring. 4. Exam otherwise unremarkable.   Electronically Signed   By: Lajean Manes M.D.   On: 07/25/2013 14:55    ASSESSMENT:  #1. Reactive neutrophilic leukocytosis. #2. Reactive hypoglycemia with no evidence of insulinoma  bilateral chest or imaging. #3. Coronary artery disease, status post sex-tuple bypass with valve replacement, continuing to smoke.  #4. Peripheral vascular disease, status post endarterectomy.  #5. History of thrombotic stroke, no residual neurologic deficit.  #6. History of ventricular dysrhythmia, defibrillator in place.      PLAN:  #1. Patient was given information about reactive hypoglycemia, the foods to avoid, the food to ingest, frequency, and quantity of such. #2. He was formally consulted to see a dietitian. #3. No further appointments were made in this office.   All questions were answered. The patient knows to call the clinic with any problems, questions or concerns. We can certainly see the patient much sooner if necessary.   I spent 25 minutes counseling the patient face to face. The total time spent in the appointment was 30 minutes.    Doroteo Bradford, MD 08/22/2013 3:32 PM

## 2013-08-22 NOTE — Patient Instructions (Signed)
[facility] Discharge Instructions  RECOMMENDATIONS MADE BY THE CONSULTANT AND ANY TEST RESULTS WILL BE SENT TO YOUR REFERRING PHYSICIAN.  EXAM FINDINGS BY THE PHYSICIAN TODAY AND SIGNS OR SYMPTOMS TO REPORT TO CLINIC OR PRIMARY PHYSICIAN: Exam and findings as discussed by Dr. Barnet Glasgow.  Thinks that you have Reactive hypoglycemia.  Dietary recommendations given and consult will be made with our dietitian.  You will be called with an appointment.  No further follow-up needed here.  MEDICATIONS PRESCRIBED:  none  INSTRUCTIONS/FOLLOW-UP: No follow-up needed.  Thank you for choosing [facility] to provide your oncology and hematology care.  To afford each patient quality time with our providers, please arrive at least 15 minutes before your scheduled appointment time.  With your help, our goal is to use those 15 minutes to complete the necessary work-up to ensure our physicians have the information they need to help with your evaluation and healthcare recommendations.    Effective January 1st, 2014, we ask that you re-schedule your appointment with our physicians should you arrive 10 or more minutes late for your appointment.  We strive to give you quality time with our providers, and arriving late affects you and other patients whose appointments are after yours.    Again, thank you for choosing [facility].  Our hope is that these requests will decrease the amount of time that you wait before being seen by our physicians.       _____________________________________________________________  Should you have questions after your visit to [facility], please contact our office at [phone] between the hours of 8:30 a.m. and 5:00 p.m.  Voicemails left after 4:30 p.m. will not be returned until the following business day.  For prescription refill requests, have your pharmacy contact our office with your prescription refill request.

## 2013-08-25 ENCOUNTER — Telehealth (HOSPITAL_COMMUNITY): Payer: Self-pay | Admitting: Dietician

## 2013-08-25 NOTE — Telephone Encounter (Signed)
Sent letter to pt home via US Mail in attempt to contact pt to schedule appointment.  

## 2013-08-25 NOTE — Telephone Encounter (Signed)
Message copied by Domenick Bookbinder on Mon Aug 25, 2013  3:59 PM ------      Message from: Mellissa Kohut      Created: Fri Aug 22, 2013  3:32 PM      Regarding: dietary consult       Nutritional consultation for patient with reactive hypoglycemia.  ------

## 2013-08-29 NOTE — Telephone Encounter (Signed)
No response to previous contact attempt. Sent letter to pt home via US Mail in attempt to contact pt to schedule appointment.  

## 2013-09-03 ENCOUNTER — Ambulatory Visit (INDEPENDENT_AMBULATORY_CARE_PROVIDER_SITE_OTHER): Payer: Medicare Other | Admitting: *Deleted

## 2013-09-03 DIAGNOSIS — I5022 Chronic systolic (congestive) heart failure: Secondary | ICD-10-CM

## 2013-09-03 DIAGNOSIS — Z95810 Presence of automatic (implantable) cardiac defibrillator: Secondary | ICD-10-CM

## 2013-09-04 NOTE — Telephone Encounter (Signed)
Pt has not responded to attempts to contact to schedule appointment. Referral filed.  

## 2013-09-05 ENCOUNTER — Ambulatory Visit (INDEPENDENT_AMBULATORY_CARE_PROVIDER_SITE_OTHER): Payer: Medicare Other | Admitting: Cardiology

## 2013-09-05 ENCOUNTER — Encounter: Payer: Self-pay | Admitting: Cardiology

## 2013-09-05 VITALS — BP 110/67 | HR 69 | Ht 71.0 in | Wt 212.0 lb

## 2013-09-05 DIAGNOSIS — I2510 Atherosclerotic heart disease of native coronary artery without angina pectoris: Secondary | ICD-10-CM

## 2013-09-05 DIAGNOSIS — I2589 Other forms of chronic ischemic heart disease: Secondary | ICD-10-CM

## 2013-09-05 DIAGNOSIS — I255 Ischemic cardiomyopathy: Secondary | ICD-10-CM

## 2013-09-05 NOTE — Patient Instructions (Signed)
Your physician wants you to follow-up in: 6 months You will receive a reminder letter in the mail two months in advance. If you don't receive a letter, please call our office to schedule the follow-up appointment.     Your physician recommends that you continue on your current medications as directed. Please refer to the Current Medication list given to you today.      Thank you for choosing [facility] !        

## 2013-09-05 NOTE — Progress Notes (Signed)
Clinical Summary [facility] is a 58 y.o.male seen today for follow up. This is a problems focused visit on his history of CAD/ICM.   1. CAD/ICM  - Echo 11/2011 LVEF 15-20%,  - CABG 09/2011 x 6 vessels (LIMA-LAD with sequential SVG to OM1, OM2, OM3. SVG-PDA o LCX, SVG to RCA.  - he has an ICD, Medtronic dual chamber ,that is followed by EP Dr. Lovena Le. Normal function on last check 05/2013.  - no chest pain. <1/2 block DOE which is stable, no orthopnea, no PND, no LE edema  - compliant with meds. Limiting sodium. Takes mobic for back pain.   -  Last visit increased coreg to 12.5mg  in AM and 25mg  at night. Notes some lightheadness at times with standing that overall is mild and tolerable.    Past Medical History  Diagnosis Date  . Cerebrovascular disease     h/o CVA in 07/2011 + left carotid endarterectomy  . Hyperlipemia   . Pneumonia   . COPD (chronic obstructive pulmonary disease)     on 2L O2 at home since 08/2011  . 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 ICD (implantable cardiac defibrillator) in place   . Coronary artery disease   . CHF (congestive heart failure)   . Myocardial infarction 08/2011  . Stroke 07/2011    denies residual (06/14/2012)  . Chronic lower back pain   . Arthritis     "back; knees" (06/14/2012)  . DDD (degenerative disc disease), lumbosacral   . Depression 09/2011    "after OHS" (06/14/2012)  . Cholelithiasis 07/05/2012    Asymptomatic; identified on CT scanning of the chest;  . Other primary cardiomyopathies      Allergies  Allergen Reactions  . Metoprolol Shortness Of Breath and Nausea And Vomiting  . Peach Flavor Hives and Itching    "peaches; peach flavoring; actually happened after he ate a store-bought peach pie"  . Morphine And Related Hives and Itching     Current Outpatient Prescriptions  Medication Sig Dispense Refill  . albuterol (PROVENTIL HFA;VENTOLIN HFA) 108 (90  BASE) MCG/ACT inhaler Inhale 1 puff into the lungs every 6 (six) hours as needed for wheezing or shortness of breath.  1 Inhaler  2  . aspirin 81 MG tablet Take 81 mg by mouth daily.      . Azithromycin (ZITHROMAX Z-PAK PO) Take 1 tablet by mouth daily.      . carvedilol (COREG) 12.5 MG tablet Take (1 tablet)12.5 mg in the morning and ( 2 tablets) 25 mg at night  90 tablet  6  . dextromethorphan (DELSYM) 30 MG/5ML liquid Take by mouth as needed for cough.      . docusate sodium (COLACE) 100 MG capsule Take 100 mg by mouth daily.      Marland Kitchen EPIPEN 2-PAK 0.3 MG/0.3ML SOAJ injection       . escitalopram (LEXAPRO) 20 MG tablet Take 20 mg by mouth daily.      . furosemide (LASIX) 20 MG tablet Take one half tablet by mouth daily  30 tablet  2  . HYDROcodone-acetaminophen (NORCO) 10-325 MG per tablet Take 1 tablet by mouth every 8 (eight) hours as needed. Pain  21 tablet  0  . lisinopril (PRINIVIL,ZESTRIL) 5 MG tablet Take 1 tablet (5 mg total) by mouth daily.  30 tablet  6  . meloxicam (MOBIC) 15 MG tablet Take 15 mg by mouth daily.      Marland Kitchen  simvastatin (ZOCOR) 40 MG tablet Take 1 tablet (40 mg total) by mouth at bedtime.  30 tablet  6   No current facility-administered medications for this visit.     Past Surgical History  Procedure Laterality Date  . Back surgery    . Knee arthroscopy  2002    "right" (06/14/2012)  . Foreign body removal  06/05/2011    Procedure: FOREIGN BODY REMOVAL ADULT;  Surgeon: Hermelinda Dellen;  Location: [employer];  Service: Plastics;  Laterality: Right;  removal foreign body from right side face   . Carotid endarterectomy  07/2011    "left" (06/14/2012)  . Tee without cardioversion  10/17/2011    Procedure: TRANSESOPHAGEAL ECHOCARDIOGRAM (TEE);  Surgeon: Jolaine Artist, MD;  Location: [facility] ENDOSCOPY;  Service: Cardiovascular;  Laterality: N/A;  . Coronary artery bypass graft  10/20/2011    Procedure: CORONARY ARTERY BYPASS GRAFTING (CABG);  Surgeon:  [name], MD;  Location: [place];  Service: Open Heart Surgery;  Laterality: N/A;  CABG x six;  using left internal mammary artery and right leg greater saphenous vein harvested endoscopically  . Mitral valve repair  10/20/2011    Procedure: MITRAL VALVE REPAIR (MVR);  Surgeon: [name], MD;  Location: [place];  Service: Open Heart Surgery;  Laterality: N/A;  . Cardiac defibrillator placement  06/14/2012  . Posterior lumbar fusion  1996  . Lumbar disc surgery  2012  . Cardiac catheterization  09/2011     Allergies  Allergen Reactions  . Metoprolol Shortness Of Breath and Nausea And Vomiting  . Peach Flavor Hives and Itching    "peaches; peach flavoring; actually happened after he ate a store-bought peach pie"  . Morphine And Related Hives and Itching      Family History  Problem Relation Age of Onset  . Coronary artery disease Mother   . Coronary artery disease Father   . Arrhythmia Brother      Social History [facility] reports that he has been smoking Cigarettes.  He has a 63 pack-year smoking history. He has never used smokeless tobacco. [facility] reports that he does not drink alcohol.   Review of Systems CONSTITUTIONAL: No weight loss, fever, chills, weakness or fatigue.  HEENT: Eyes: No visual loss, blurred vision, double vision or yellow sclerae.No hearing loss, sneezing, congestion, runny nose or sore throat.  SKIN: No rash or itching.  CARDIOVASCULAR: per HPI RESPIRATORY: No shortness of breath, cough or sputum.  GASTROINTESTINAL: No anorexia, nausea, vomiting or diarrhea. No abdominal pain or blood.  GENITOURINARY: No burning on urination, no polyuria NEUROLOGICAL: No headache, dizziness, syncope, paralysis, ataxia, numbness or tingling in the extremities. No change in bowel or bladder control.  MUSCULOSKELETAL: No muscle, back pain, joint pain or stiffness.  LYMPHATICS: No enlarged nodes. No history of splenectomy.  PSYCHIATRIC: No history of depression  or anxiety.  ENDOCRINOLOGIC: No reports of sweating, cold or heat intolerance. No polyuria or polydipsia.  Marland Kitchen   Physical Examination p 69 bp 110/67 Wt 212 lbs BMI 30 Gen: resting comfortably, no acute distress HEENT: no scleral icterus, pupils equal round and reactive, no palptable cervical adenopathy,  CV: RRR, no m/r/g, no JVD, no carotid bruits Resp: Clear to auscultation bilaterally GI: abdomen is soft, non-tender, non-distended, normal bowel sounds, no hepatosplenomegaly MSK: extremities are warm, no edema.  Skin: warm, no rash Neuro:  no focal deficits Psych: appropriate affect   Diagnostic Studies 11/2011 Echo  LVEF 15-20%, anular MV ring with  trivial MR, severe RV dysfunction,   09/2011 TEE  LEFT VENTRICLE: EF = 25% Mildly dilated. Global HK.   RIGHT VENTRICLE: Moderately HK  LEFT ATRIUM: Moderately dilated  LEFT ATRIAL APPENDAGE: No thrombus  RIGHT ATRIUM: Normal  AORTIC VALVE: Trileaflet. Trivial AI. No AS.  MITRAL VALVE: Structurally normal. Mild to moderate MR.  TRICUSPID VALVE: Normal Trivial TR  PULMONIC VALVE: Normal  INTERATRIAL SEPTUM: No ASD or PFO.  PERICARDIUM: Moderate effusion along RA//RV. No tamponade.  DESCENDING AORTA: Severe plaque.   09/2011 Cath  Findings:  On milrinone 0.25 mg.kg/min  RA = 5  RV = 30/5/7  PA = 30/16 (23)  PCW = 20 (no significant v-waves)  Fick cardiac output/index = 5.46/2.8  PVR = 0.5 Woods  SVR = 981  FA sat = 93%  PA sat = 67%, 71% (on milrinone)  Ao Pressure: 92/63 (76)  LV Pressure: 98/8/18  There was no signficant gradient across the aortic valve on pullback.  Left main: 80-90% distal  LAD: Flush occlusion at ostium. Mild filling in mid and distal segments through L to L and R to L collaterals  LCX: Large. Dominant. 70-80% mid AV groove. OM-1 small to moderate vessel with mild ostial disease. OM-2 very large 99% lesion in mid section at trifurcation. 90% lesion in lowest Jartavious Mckimmy. PDA 50-60 ostial  RCA: Small to  moderate-sized, non- dominant vessel ending with acute marginal Milla Wahlberg. Diffuse 60% prox to mid. 95% midsection. R to L collats to LAD from acute marginal.  LV-gram done in the RAO projection: Ejection fraction = 25-30% with global HK 3+ MR  L subclavian: Widely patent to chest wall with mild plaquing  Assessment:  1. 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 Ischemic CM with EF 25-30%  3. 3+ mitral regurgitation  4. Well compensated hemodynamics on milrinone  Plan/Discussion:  Will need CABG/MVR. Check PFTs and TEE. Continue milrinone. Transfer stepdown. Consult TCTS.    Assessment and Plan  1. CAD/ICM  - LVEF 15-20% by echo 11/2011, NYHA III, he has a Medtronic ICD  - titration of medications has been limited by low blood pressures per notes - last visit increased coreg to 12.5 mg in AM and 25 mg at night, with only mild orthostatic symptoms that are tolerable.  - continue current meds, no further titration at this time.  - counseled to cut down on mobic use    Follow up 3 months       Arnoldo Lenis, M.D., F.A.C.C.

## 2013-09-09 LAB — MDC_IDC_ENUM_SESS_TYPE_REMOTE
Date Time Interrogation Session: 20150311150333
HIGH POWER IMPEDANCE MEASURED VALUE: 78 Ohm
HighPow Impedance: 190 Ohm
Lead Channel Impedance Value: 532 Ohm
Lead Channel Pacing Threshold Amplitude: 0.625 V
Lead Channel Pacing Threshold Pulse Width: 0.4 ms
Lead Channel Sensing Intrinsic Amplitude: 6.125 mV
Lead Channel Setting Pacing Amplitude: 2 V
Lead Channel Setting Pacing Pulse Width: 0.4 ms
MDC IDC MSMT BATTERY REMAINING LONGEVITY: 131 mo
MDC IDC MSMT BATTERY VOLTAGE: 3.05 V
MDC IDC MSMT LEADCHNL RV SENSING INTR AMPL: 6.125 mV
MDC IDC SET LEADCHNL RV SENSING SENSITIVITY: 0.3 mV
MDC IDC SET ZONE DETECTION INTERVAL: 280 ms
MDC IDC SET ZONE DETECTION INTERVAL: 360 ms
MDC IDC SET ZONE DETECTION INTERVAL: 450 ms
MDC IDC STAT BRADY RV PERCENT PACED: 0.01 %

## 2013-09-11 ENCOUNTER — Encounter: Payer: Self-pay | Admitting: *Deleted

## 2013-09-15 ENCOUNTER — Encounter: Payer: Self-pay | Admitting: *Deleted

## 2013-09-15 ENCOUNTER — Telehealth: Payer: Self-pay | Admitting: Cardiology

## 2013-09-15 MED ORDER — SIMVASTATIN 40 MG PO TABS: 40.0000 mg | ORAL_TABLET | Freq: Every day | ORAL | Status: DC

## 2013-09-15 NOTE — Telephone Encounter (Signed)
Medication sent via escribe.  

## 2013-09-15 NOTE — Telephone Encounter (Signed)
Received fax refill request  Rx # [id number] Medication:  Simvastatin 40mg  tab Qty 30 Sig:  Take one tablet by mouth at bedtime Physician:  Lattie Haw

## 2013-09-23 ENCOUNTER — Encounter: Payer: Self-pay | Admitting: Internal Medicine

## 2013-12-08 ENCOUNTER — Telehealth: Payer: Self-pay | Admitting: Cardiology

## 2013-12-08 ENCOUNTER — Ambulatory Visit (INDEPENDENT_AMBULATORY_CARE_PROVIDER_SITE_OTHER): Payer: Medicare Other | Admitting: *Deleted

## 2013-12-08 DIAGNOSIS — Z95810 Presence of automatic (implantable) cardiac defibrillator: Secondary | ICD-10-CM

## 2013-12-08 DIAGNOSIS — I5022 Chronic systolic (congestive) heart failure: Secondary | ICD-10-CM

## 2013-12-08 NOTE — Telephone Encounter (Signed)
LMOVM reminding pt to send remote transmission.   

## 2013-12-09 ENCOUNTER — Encounter: Payer: Self-pay | Admitting: Cardiology

## 2013-12-09 LAB — MDC_IDC_ENUM_SESS_TYPE_REMOTE
Battery Remaining Longevity: 127 mo
Brady Statistic RV Percent Paced: 0 %
HighPow Impedance: 82 Ohm
Lead Channel Setting Sensing Sensitivity: 0.3 mV
MDC IDC MSMT LEADCHNL RV IMPEDANCE VALUE: 589 Ohm
MDC IDC MSMT LEADCHNL RV PACING THRESHOLD AMPLITUDE: 0.625 V
MDC IDC MSMT LEADCHNL RV PACING THRESHOLD PULSEWIDTH: 0.4 ms
MDC IDC MSMT LEADCHNL RV SENSING INTR AMPL: 7.5 mV
MDC IDC SET LEADCHNL RV PACING AMPLITUDE: 2 V
MDC IDC SET LEADCHNL RV PACING PULSEWIDTH: 0.4 ms
MDC IDC SET ZONE DETECTION INTERVAL: 280 ms
MDC IDC SET ZONE DETECTION INTERVAL: 360 ms
Zone Setting Detection Interval: 450 ms

## 2013-12-10 DIAGNOSIS — I5022 Chronic systolic (congestive) heart failure: Secondary | ICD-10-CM

## 2013-12-10 DIAGNOSIS — Z95810 Presence of automatic (implantable) cardiac defibrillator: Secondary | ICD-10-CM

## 2013-12-10 NOTE — Progress Notes (Signed)
Remote ICD transmission.   

## 2013-12-16 ENCOUNTER — Encounter: Payer: Self-pay | Admitting: Cardiology

## 2013-12-18 ENCOUNTER — Encounter: Payer: Self-pay | Admitting: Internal Medicine

## 2014-02-10 ENCOUNTER — Other Ambulatory Visit: Payer: Self-pay | Admitting: Adult Health

## 2014-03-05 ENCOUNTER — Ambulatory Visit: Payer: Medicare Other | Admitting: Cardiology

## 2014-03-12 ENCOUNTER — Telehealth: Payer: Self-pay | Admitting: Cardiology

## 2014-03-12 ENCOUNTER — Ambulatory Visit (INDEPENDENT_AMBULATORY_CARE_PROVIDER_SITE_OTHER): Payer: Medicare Other | Admitting: *Deleted

## 2014-03-12 DIAGNOSIS — I255 Ischemic cardiomyopathy: Secondary | ICD-10-CM

## 2014-03-12 DIAGNOSIS — I2589 Other forms of chronic ischemic heart disease: Secondary | ICD-10-CM

## 2014-03-12 DIAGNOSIS — I5022 Chronic systolic (congestive) heart failure: Secondary | ICD-10-CM

## 2014-03-12 LAB — MDC_IDC_ENUM_SESS_TYPE_REMOTE
Battery Remaining Longevity: 127 mo
Brady Statistic RV Percent Paced: 0.01 %
Date Time Interrogation Session: 20150917190308
HIGH POWER IMPEDANCE MEASURED VALUE: 171 Ohm
HIGH POWER IMPEDANCE MEASURED VALUE: 73 Ohm
Lead Channel Impedance Value: 475 Ohm
Lead Channel Pacing Threshold Amplitude: 0.625 V
Lead Channel Pacing Threshold Pulse Width: 0.4 ms
Lead Channel Sensing Intrinsic Amplitude: 8 mV
Lead Channel Sensing Intrinsic Amplitude: 8 mV
Lead Channel Setting Pacing Amplitude: 2 V
MDC IDC MSMT BATTERY VOLTAGE: 3.03 V
MDC IDC SET LEADCHNL RV PACING PULSEWIDTH: 0.4 ms
MDC IDC SET LEADCHNL RV SENSING SENSITIVITY: 0.3 mV
MDC IDC SET ZONE DETECTION INTERVAL: 360 ms
Zone Setting Detection Interval: 280 ms
Zone Setting Detection Interval: 450 ms

## 2014-03-12 NOTE — Telephone Encounter (Signed)
LMOVM reminding pt to send remote transmission.   

## 2014-03-12 NOTE — Progress Notes (Signed)
Remote ICD transmission.   

## 2014-03-17 ENCOUNTER — Encounter: Payer: Self-pay | Admitting: Cardiology

## 2014-03-18 ENCOUNTER — Encounter: Payer: Self-pay | Admitting: Internal Medicine

## 2014-03-23 ENCOUNTER — Encounter: Payer: Self-pay | Admitting: Cardiology

## 2014-03-23 ENCOUNTER — Ambulatory Visit (INDEPENDENT_AMBULATORY_CARE_PROVIDER_SITE_OTHER): Payer: Medicare Other | Admitting: Cardiology

## 2014-03-23 VITALS — BP 130/70 | HR 64 | Ht 68.0 in | Wt 217.0 lb

## 2014-03-23 DIAGNOSIS — E782 Mixed hyperlipidemia: Secondary | ICD-10-CM

## 2014-03-23 DIAGNOSIS — I5022 Chronic systolic (congestive) heart failure: Secondary | ICD-10-CM

## 2014-03-23 DIAGNOSIS — I10 Essential (primary) hypertension: Secondary | ICD-10-CM

## 2014-03-23 DIAGNOSIS — I059 Rheumatic mitral valve disease, unspecified: Secondary | ICD-10-CM

## 2014-03-23 DIAGNOSIS — I2510 Atherosclerotic heart disease of native coronary artery without angina pectoris: Secondary | ICD-10-CM

## 2014-03-23 MED ORDER — SPIRONOLACTONE 25 MG PO TABS: 12.5000 mg | ORAL_TABLET | Freq: Every day | ORAL | Status: DC

## 2014-03-23 MED ORDER — ATORVASTATIN CALCIUM 80 MG PO TABS: 80.0000 mg | ORAL_TABLET | Freq: Every day | ORAL | Status: DC

## 2014-03-23 NOTE — Progress Notes (Signed)
Clinical Summary Mr. Zea is a 58 y.o.male seen today for follow up of the following medical problems.   1. CAD/ICM  - Echo 11/2011 LVEF 15-20%,  - CABG 09/2011 x 6 vessels (LIMA-LAD with sequential SVG to OM1, OM2, OM3. SVG-PDA o LCX, SVG to RCA.  - he has an ICD, Medtronic dual chamber ,that is followed by EP Dr. Lovena Le. Normal function on last check 02/2014.  - no chest pain. <1/2 block DOE which is stable, no orthopnea, no PND, no LE edema  - compliant with meds. Limiting sodium.   2. Mitral valve regurgitation - repair with 28 mm annuloplasty ring 09/2011 - denies any LE edema, no SOB or DOE  3. Hyperlipidemia - compliant with statin  Past Medical History  Diagnosis Date  . Cerebrovascular disease     h/o CVA in 07/2011 + left carotid endarterectomy  . Hyperlipemia   . Pneumonia   . COPD (chronic obstructive pulmonary disease)     on 2L O2 at home since 08/2011  . 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 ICD (implantable cardiac defibrillator) in place   . Coronary artery disease   . CHF (congestive heart failure)   . Myocardial infarction 08/2011  . Stroke 07/2011    denies residual (06/14/2012)  . Chronic lower back pain   . Arthritis     "back; knees" (06/14/2012)  . DDD (degenerative disc disease), lumbosacral   . Depression 09/2011    "after OHS" (06/14/2012)  . Cholelithiasis 07/05/2012    Asymptomatic; identified on CT scanning of the chest;  . Other primary cardiomyopathies      Allergies  Allergen Reactions  . Metoprolol Shortness Of Breath and Nausea And Vomiting  . Peach Flavor Hives and Itching    "peaches; peach flavoring; actually happened after he ate a store-bought peach pie"  . Morphine And Related Hives and Itching     Current Outpatient Prescriptions  Medication Sig Dispense Refill  . albuterol (PROVENTIL HFA;VENTOLIN HFA) 108 (90 BASE) MCG/ACT inhaler Inhale 1 puff into the lungs  every 6 (six) hours as needed for wheezing or shortness of breath.  1 Inhaler  2  . aspirin 81 MG tablet Take 81 mg by mouth daily.      . Azithromycin (ZITHROMAX Z-PAK PO) Take 1 tablet by mouth daily.      . carvedilol (COREG) 12.5 MG tablet Take (1 tablet)12.5 mg in the morning and ( 2 tablets) 25 mg at night  90 tablet  6  . dextromethorphan (DELSYM) 30 MG/5ML liquid Take by mouth as needed for cough.      . docusate sodium (COLACE) 100 MG capsule Take 100 mg by mouth daily.      Marland Kitchen EPIPEN 2-PAK 0.3 MG/0.3ML SOAJ injection       . escitalopram (LEXAPRO) 20 MG tablet Take 20 mg by mouth daily.      . furosemide (LASIX) 20 MG tablet Take one half tablet by mouth daily  30 tablet  2  . HYDROcodone-acetaminophen (NORCO) 10-325 MG per tablet Take 1 tablet by mouth every 8 (eight) hours as needed. Pain  21 tablet  0  . lisinopril (PRINIVIL,ZESTRIL) 5 MG tablet TAKE ONE TABLET BY MOUTH ONCE DAILY  30 tablet  3  . meloxicam (MOBIC) 15 MG tablet Take 15 mg by mouth daily.      . simvastatin (ZOCOR) 40 MG tablet Take 1 tablet (40 mg total)  by mouth at bedtime.  30 tablet  6   No current facility-administered medications for this visit.     Past Surgical History  Procedure Laterality Date  . Back surgery    . Knee arthroscopy  2002    "right" (06/14/2012)  . Foreign body removal  06/05/2011    Procedure: FOREIGN BODY REMOVAL ADULT;  Surgeon: Hermelinda Dellen;  Location: [place];  Service: Plastics;  Laterality: Right;  removal foreign body from right side face   . Carotid endarterectomy  07/2011    "left" (06/14/2012)  . Tee without cardioversion  10/17/2011    Procedure: TRANSESOPHAGEAL ECHOCARDIOGRAM (TEE);  Surgeon: Jolaine Artist, MD;  Location: [facility] ENDOSCOPY;  Service: Cardiovascular;  Laterality: N/A;  . Coronary artery bypass graft  10/20/2011    Procedure: CORONARY ARTERY BYPASS GRAFTING (CABG);  Surgeon: [name], MD;  Location: [place];  Service: Open Heart  Surgery;  Laterality: N/A;  CABG x six;  using left internal mammary artery and right leg greater saphenous vein harvested endoscopically  . Mitral valve repair  10/20/2011    Procedure: MITRAL VALVE REPAIR (MVR);  Surgeon: [name], MD;  Location: [place];  Service: Open Heart Surgery;  Laterality: N/A;  . Cardiac defibrillator placement  06/14/2012  . Posterior lumbar fusion  1996  . Lumbar disc surgery  2012  . Cardiac catheterization  09/2011     Allergies  Allergen Reactions  . Metoprolol Shortness Of Breath and Nausea And Vomiting  . Peach Flavor Hives and Itching    "peaches; peach flavoring; actually happened after he ate a store-bought peach pie"  . Morphine And Related Hives and Itching      Family History  Problem Relation Age of Onset  . Coronary artery disease Mother   . Coronary artery disease Father   . Arrhythmia Brother      Social History Mr. Iyengar reports that he has been smoking Cigarettes.  He has a 63 pack-year smoking history. He has never used smokeless tobacco. Mr. Shearer reports that he does not drink alcohol.   Review of Systems CONSTITUTIONAL: No weight loss, fever, chills, weakness or fatigue.  HEENT: Eyes: No visual loss, blurred vision, double vision or yellow sclerae.No hearing loss, sneezing, congestion, runny nose or sore throat.  SKIN: No rash or itching.  CARDIOVASCULAR: per HPI RESPIRATORY: No shortness of breath, cough or sputum.  GASTROINTESTINAL: No anorexia, nausea, vomiting or diarrhea. No abdominal pain or blood.  GENITOURINARY: No burning on urination, no polyuria NEUROLOGICAL: No headache, dizziness, syncope, paralysis, ataxia, numbness or tingling in the extremities. No change in bowel or bladder control.  MUSCULOSKELETAL: No muscle, back pain, joint pain or stiffness.  LYMPHATICS: No enlarged nodes. No history of splenectomy.  PSYCHIATRIC: No history of depression or anxiety.  ENDOCRINOLOGIC: No reports of sweating, cold  or heat intolerance. No polyuria or polydipsia.  Marland Kitchen   Physical Examination p 64 bp 130/70 Wt 217 lbs BMI 33 Gen: resting comfortably, no acute distress HEENT: no scleral icterus, pupils equal round and reactive, no palptable cervical adenopathy,  CV: RRR, no m/r/g, no JVD, no carotid bruits Resp: Clear to auscultation bilaterally GI: abdomen is soft, non-tender, non-distended, normal bowel sounds, no hepatosplenomegaly MSK: extremities are warm, no edema.  Skin: warm, no rash Neuro:  no focal deficits Psych: appropriate affect   Diagnostic Studies 11/2011 Echo  LVEF 15-20%, anular MV ring with trivial MR, severe RV dysfunction,  09/2011 TEE  LEFT VENTRICLE:  EF = 25% Mildly dilated. Global HK.   RIGHT VENTRICLE: Moderately HK  LEFT ATRIUM: Moderately dilated  LEFT ATRIAL APPENDAGE: No thrombus  RIGHT ATRIUM: Normal  AORTIC VALVE: Trileaflet. Trivial AI. No AS.  MITRAL VALVE: Structurally normal. Mild to moderate MR.  TRICUSPID VALVE: Normal Trivial TR  PULMONIC VALVE: Normal  INTERATRIAL SEPTUM: No ASD or PFO.  PERICARDIUM: Moderate effusion along RA//RV. No tamponade.  DESCENDING AORTA: Severe plaque.  09/2011 Cath  Findings:  On milrinone 0.25 mg.kg/min  RA = 5  RV = 30/5/7  PA = 30/16 (23)  PCW = 20 (no significant v-waves)  Fick cardiac output/index = 5.46/2.8  PVR = 0.5 Woods  SVR = 981  FA sat = 93%  PA sat = 67%, 71% (on milrinone)  Ao Pressure: 92/63 (76)  LV Pressure: 98/8/18  There was no signficant gradient across the aortic valve on pullback.  Left main: 80-90% distal  LAD: Flush occlusion at ostium. Mild filling in mid and distal segments through L to L and R to L collaterals  LCX: Large. Dominant. 70-80% mid AV groove. OM-1 small to moderate vessel with mild ostial disease. OM-2 very large 99% lesion in mid section at trifurcation. 90% lesion in lowest Traeger Sultana. PDA 50-60 ostial  RCA: Small to moderate-sized, non- dominant vessel ending with acute marginal  Lujuana Kapler. Diffuse 60% prox to mid. 95% midsection. R to L collats to LAD from acute marginal.  LV-gram done in the RAO projection: Ejection fraction = 25-30% with global HK 3+ MR  L subclavian: Widely patent to chest wall with mild plaquing  Assessment:  1. 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 Ischemic CM with EF 25-30%  3. 3+ mitral regurgitation  4. Well compensated hemodynamics on milrinone  Plan/Discussion:  Will need CABG/MVR. Check PFTs and TEE. Continue milrinone. Transfer stepdown. Consult TCTS.     Assessment and Plan  1. CAD/ICM/Chronic systolic HF - LVEF 10-93% by echo 11/2011, NYHA III, he has a Medtronic ICD  - titration of medications has been limited by low blood pressures per notes  - will try starting aldactone 12.5mg  daily with BMET in 2 weeks - counseled to cut down on mobic use   2. Mitral valve regurgitation - s/p repair, denies any current symptoms - continue to follow clinically  3. Hyperlipidemia - hx of CAD, will change to high dose statin. Start atorva 80mg  daily.    F/u 3 months      Arnoldo Lenis, M.D.

## 2014-03-23 NOTE — Patient Instructions (Signed)
Your physician recommends that you schedule a follow-up appointment in: 3 months     Your physician has recommended you make the following change in your medication:        STOP Simvastatin    START Atorvastatin 80 mg daily   START Aldactone 12.5 mg daily       Please get labs in 2 weeks (BMET)         Thank you for choosing [facility] !

## 2014-03-27 ENCOUNTER — Telehealth: Payer: Self-pay | Admitting: Cardiology

## 2014-03-27 DIAGNOSIS — R252 Cramp and spasm: Secondary | ICD-10-CM

## 2014-03-27 MED ORDER — CARVEDILOL 12.5 MG PO TABS: ORAL_TABLET | ORAL | Status: DC

## 2014-03-27 NOTE — Telephone Encounter (Signed)
How long ago did the leg cramping start? Did it correlate with starting atorvastatin? He has labs ordered, please add Mg level. He can get them drawn when available.    Zandra Abts MD

## 2014-03-27 NOTE — Telephone Encounter (Signed)
Cramping started to worsen 2 days ago and pt just started new medications atorvastatin and aldactone today. I added Mg to blood work pt will get labs drawn next week. Please advise

## 2014-03-27 NOTE — Telephone Encounter (Signed)
Reordered [facility] in [place]. Pt wife concerned with pt legs cramping and painful. Pt not on KL. Will forward to Dr. Harl Bowie

## 2014-03-27 NOTE — Telephone Encounter (Signed)
Please call patient's wife regarding patients medications. / tgs

## 2014-03-30 NOTE — Telephone Encounter (Signed)
We will follow up on his potassium and magnesium levels, if normal further workup would need to be from his pcp as it would not have anything to do with his heart medications.    Zandra Abts MD

## 2014-03-30 NOTE — Telephone Encounter (Signed)
To clarify he should have a BMET and Mg level done to see if cause of cramping   Zandra Abts MD

## 2014-03-30 NOTE — Telephone Encounter (Signed)
Pt will have labs drawn next week. Added Mg level to blood work, BMP already in orders.

## 2014-04-03 ENCOUNTER — Other Ambulatory Visit: Payer: Self-pay | Admitting: Cardiology

## 2014-04-04 LAB — MAGNESIUM: MAGNESIUM: 1.8 mg/dL (ref 1.5–2.5)

## 2014-04-06 ENCOUNTER — Telehealth: Payer: Self-pay | Admitting: *Deleted

## 2014-04-06 LAB — BASIC METABOLIC PANEL
BUN: 16 mg/dL (ref 6–23)
CALCIUM: 9.2 mg/dL (ref 8.4–10.5)
CO2: 23 mEq/L (ref 19–32)
Chloride: 101 mEq/L (ref 96–112)
Creat: 1.36 mg/dL — ABNORMAL HIGH (ref 0.50–1.35)
Glucose, Bld: 139 mg/dL — ABNORMAL HIGH (ref 70–99)
Potassium: 4.4 mEq/L (ref 3.5–5.3)
Sodium: 134 mEq/L — ABNORMAL LOW (ref 135–145)

## 2014-04-06 NOTE — Telephone Encounter (Signed)
Message copied by Desma Mcgregor on Mon Apr 06, 2014 10:33 AM ------      Message from: [place] F      Created: Mon Apr 06, 2014 10:12 AM       Recent labs only show mg, he was supposed to have a BMET as well. Please call [facility] to add on, they should be able to use the blood draw they already did            Zandra Abts MD ------

## 2014-04-06 NOTE — Telephone Encounter (Signed)
[facility] was able to add BMP without another draw. FYI to Dr. Harl Bowie

## 2014-05-15 ENCOUNTER — Encounter: Payer: Self-pay | Admitting: *Deleted

## 2014-05-15 ENCOUNTER — Other Ambulatory Visit: Payer: Self-pay | Admitting: Internal Medicine

## 2014-05-15 ENCOUNTER — Ambulatory Visit (INDEPENDENT_AMBULATORY_CARE_PROVIDER_SITE_OTHER): Payer: Medicare Other | Admitting: Internal Medicine

## 2014-05-15 ENCOUNTER — Encounter: Payer: Self-pay | Admitting: Internal Medicine

## 2014-05-15 VITALS — BP 128/70 | HR 58 | Ht 68.0 in | Wt 220.0 lb

## 2014-05-15 DIAGNOSIS — I5022 Chronic systolic (congestive) heart failure: Secondary | ICD-10-CM

## 2014-05-15 DIAGNOSIS — Z720 Tobacco use: Secondary | ICD-10-CM

## 2014-05-15 DIAGNOSIS — Z95810 Presence of automatic (implantable) cardiac defibrillator: Secondary | ICD-10-CM

## 2014-05-15 LAB — MDC_IDC_ENUM_SESS_TYPE_INCLINIC
Battery Voltage: 3.03 V
Brady Statistic RV Percent Paced: 0.01 %
HIGH POWER IMPEDANCE MEASURED VALUE: 190 Ohm
HighPow Impedance: 66 Ohm
Lead Channel Impedance Value: 532 Ohm
Lead Channel Pacing Threshold Amplitude: 0.75 V
Lead Channel Sensing Intrinsic Amplitude: 6 mV
Lead Channel Sensing Intrinsic Amplitude: 9.125 mV
Lead Channel Setting Pacing Pulse Width: 0.4 ms
Lead Channel Setting Sensing Sensitivity: 0.3 mV
MDC IDC MSMT BATTERY REMAINING LONGEVITY: 126 mo
MDC IDC MSMT LEADCHNL RV PACING THRESHOLD PULSEWIDTH: 0.4 ms
MDC IDC SESS DTM: 20151120091909
MDC IDC SET LEADCHNL RV PACING AMPLITUDE: 2 V
MDC IDC SET ZONE DETECTION INTERVAL: 360 ms
MDC IDC SET ZONE DETECTION INTERVAL: 450 ms
Zone Setting Detection Interval: 280 ms

## 2014-05-15 LAB — PACEMAKER DEVICE OBSERVATION

## 2014-05-15 NOTE — Assessment & Plan Note (Signed)
His medtronic ICD is working normally. Will recheck in several months.

## 2014-05-15 NOTE — Progress Notes (Signed)
HPI Mr. Miller returns today for followup. He is a very pleasant 58 year old man with an ischemic cardiomyopathy, chronic systolic heart failure, status post ICD implantation. In the interim, the patient has done well. He denies chest pain, shortness of breath, or syncope. No ICD shocks. Allergies  Allergen Reactions  . Metoprolol Shortness Of Breath and Nausea And Vomiting  . Peach Flavor Hives and Itching    "peaches; peach flavoring; actually happened after he ate a store-bought peach pie"  . Morphine And Related Hives and Itching     Current Outpatient Prescriptions  Medication Sig Dispense Refill  . albuterol (PROVENTIL HFA;VENTOLIN HFA) 108 (90 BASE) MCG/ACT inhaler Inhale 1 puff into the lungs every 6 (six) hours as needed for wheezing or shortness of breath. 1 Inhaler 2  . aspirin 81 MG tablet Take 81 mg by mouth daily.    Marland Kitchen atorvastatin (LIPITOR) 80 MG tablet Take 1 tablet (80 mg total) by mouth daily. 90 tablet 3  . carvedilol (COREG) 12.5 MG tablet Take (1 tablet)12.5 mg in the morning and ( 2 tablets) 25 mg at night 90 tablet 6  . dextromethorphan (DELSYM) 30 MG/5ML liquid Take by mouth as needed for cough.    . docusate sodium (COLACE) 100 MG capsule Take 100 mg by mouth daily.    Marland Kitchen EPIPEN 2-PAK 0.3 MG/0.3ML SOAJ injection     . escitalopram (LEXAPRO) 20 MG tablet Take 20 mg by mouth daily.    . furosemide (LASIX) 20 MG tablet Take one half tablet by mouth daily 30 tablet 2  . HYDROcodone-acetaminophen (NORCO) 10-325 MG per tablet Take 1 tablet by mouth every 8 (eight) hours as needed. Pain 21 tablet 0  . lisinopril (PRINIVIL,ZESTRIL) 5 MG tablet TAKE ONE TABLET BY MOUTH ONCE DAILY 30 tablet 3  . meloxicam (MOBIC) 15 MG tablet Take 15 mg by mouth daily.    Marland Kitchen spironolactone (ALDACTONE) 25 MG tablet Take 0.5 tablets (12.5 mg total) by mouth daily. 90 tablet 3   No current facility-administered medications for this visit.     Past Medical History  Diagnosis Date  .  Cerebrovascular disease     h/o CVA in 07/2011 + left carotid endarterectomy  . Hyperlipemia   . Pneumonia   . COPD (chronic obstructive pulmonary disease)     on 2L O2 at home since 08/2011  . 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 ICD (implantable cardiac defibrillator) in place   . Coronary artery disease   . CHF (congestive heart failure)   . Myocardial infarction 08/2011  . Stroke 07/2011    denies residual (06/14/2012)  . Chronic lower back pain   . Arthritis     "back; knees" (06/14/2012)  . DDD (degenerative disc disease), lumbosacral   . Depression 09/2011    "after OHS" (06/14/2012)  . Cholelithiasis 07/05/2012    Asymptomatic; identified on CT scanning of the chest;  . Other primary cardiomyopathies     ROS:   All systems reviewed and negative except as noted in the HPI.   Past Surgical History  Procedure Laterality Date  . Back surgery    . Knee arthroscopy  2002    "right" (06/14/2012)  . Foreign body removal  06/05/2011    Procedure: FOREIGN BODY REMOVAL ADULT;  Surgeon: Hermelinda Dellen;  Location: [facility];  Service: Plastics;  Laterality: Right;  removal foreign body from right side face   . Carotid endarterectomy  07/2011    "left" (06/14/2012)  . Tee without cardioversion  10/17/2011    Procedure: TRANSESOPHAGEAL ECHOCARDIOGRAM (TEE);  Surgeon: Jolaine Artist, MD;  Location: [facility] ENDOSCOPY;  Service: Cardiovascular;  Laterality: N/A;  . Coronary artery bypass graft  10/20/2011    Procedure: CORONARY ARTERY BYPASS GRAFTING (CABG);  Surgeon: [name], MD;  Location: [place];  Service: Open Heart Surgery;  Laterality: N/A;  CABG x six;  using left internal mammary artery and right leg greater saphenous vein harvested endoscopically  . Mitral valve repair  10/20/2011    Procedure: MITRAL VALVE REPAIR (MVR);  Surgeon: [name], MD;  Location: [place];  Service: Open Heart Surgery;   Laterality: N/A;  . Cardiac defibrillator placement  06/14/2012  . Posterior lumbar fusion  1996  . Lumbar disc surgery  2012  . Cardiac catheterization  09/2011     Family History  Problem Relation Age of Onset  . Coronary artery disease Mother   . Coronary artery disease Father   . Arrhythmia Brother      History   Social History  . Marital Status: Married    Spouse Name: N/A    Number of Children: N/A  . Years of Education: N/A   Occupational History  . Not on file.   Social History Main Topics  . Smoking status: Current Every Day Smoker -- 1.50 packs/day for 42 years    Types: Cigarettes  . Smokeless tobacco: Never Used     Comment: quit from 09-10-2011 and started back smoking 03-26-2012  . 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 Drug Use: No  . Sexual Activity: No     Comment: Quit drinking alcohol 2 yrs ago   Other Topics Concern  . 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     BP 128/70 mmHg  Pulse 58  Ht 5\' 8"  (1.727 m)  Wt 220 lb (99.791 kg)  BMI 33.46 kg/m2  SpO2 99%  Physical Exam:  Well appearing 58 year old man,NAD HEENT: Unremarkable Neck:  6 cm JVD, no thyromegally Lungs:  Clear with no wheezes, rales, or rhonchi. HEART:  Regular rate rhythm, grade 2/6 systolic murmur, no rubs, no clicks Abd:  soft, positive bowel sounds, no organomegally, no rebound, no guarding Ext:  2 plus pulses, no edema, no cyanosis, no clubbing Skin:  No rashes no nodules Neuro:  CN II through XII intact, motor grossly intact  DEVICE  Normal device function.  See PaceArt for details.   Assess/Plan:

## 2014-05-15 NOTE — Assessment & Plan Note (Signed)
The patient has relapsed back into smoking and is smoking 1.5 packs a day. I asked him to stop smoking. He will try to cut down.

## 2014-05-15 NOTE — Patient Instructions (Signed)
Your physician wants you to follow-up in: [place] will receive a reminder letter in the mail two months in advance. If you don't receive a letter, please call our office to schedule the follow-up appointment.  Remote monitoring is used to monitor your Pacemaker of ICD from home. This monitoring reduces the number of office visits required to check your device to one time per year. It allows [place] to keep an eye on the functioning of your device to ensure it is working properly. You are scheduled for a device check from home on FEBRUARY 22ND. You may send your transmission at any time that day. If you have a wireless device, the transmission will be sent automatically. After your physician reviews your transmission, you will receive a postcard with your next transmission date.  Your physician recommends that you continue on your current medications as directed. Please refer to the Current Medication list given to you today.  Thank you for choosing [facility]!!

## 2014-05-15 NOTE — Assessment & Plan Note (Signed)
His symptoms are class 2. His optivol is up today and I asked him to take an extra lasix for 2 days. He is encouraged to reduce his sodium intake.

## 2014-06-04 ENCOUNTER — Encounter (HOSPITAL_COMMUNITY): Payer: Self-pay | Admitting: Internal Medicine

## 2014-06-16 ENCOUNTER — Ambulatory Visit: Payer: Medicare Other | Admitting: Cardiology

## 2014-06-24 ENCOUNTER — Other Ambulatory Visit: Payer: Self-pay | Admitting: Cardiology

## 2014-06-24 MED ORDER — LISINOPRIL 5 MG PO TABS: 5.0000 mg | ORAL_TABLET | Freq: Every day | ORAL | Status: DC

## 2014-06-24 NOTE — Telephone Encounter (Signed)
Received fax refill request  Rx # [id number] Medication:  Lisinopril 5 mg tablet Qty 30 Sig:  Take one tablet by mouth every day Physician:  Harl Bowie

## 2014-07-16 ENCOUNTER — Ambulatory Visit: Payer: Medicare Other | Admitting: Cardiology

## 2014-08-03 ENCOUNTER — Ambulatory Visit: Payer: Self-pay | Admitting: Cardiology

## 2014-08-06 ENCOUNTER — Encounter: Payer: Self-pay | Admitting: Cardiology

## 2014-08-06 ENCOUNTER — Ambulatory Visit (INDEPENDENT_AMBULATORY_CARE_PROVIDER_SITE_OTHER): Payer: Medicare Other | Admitting: Cardiology

## 2014-08-06 VITALS — BP 110/62 | HR 68 | Ht 71.0 in | Wt 220.0 lb

## 2014-08-06 DIAGNOSIS — G4730 Sleep apnea, unspecified: Secondary | ICD-10-CM

## 2014-08-06 DIAGNOSIS — I2510 Atherosclerotic heart disease of native coronary artery without angina pectoris: Secondary | ICD-10-CM

## 2014-08-06 DIAGNOSIS — Z136 Encounter for screening for cardiovascular disorders: Secondary | ICD-10-CM

## 2014-08-06 DIAGNOSIS — I5022 Chronic systolic (congestive) heart failure: Secondary | ICD-10-CM

## 2014-08-06 NOTE — Patient Instructions (Signed)
Your physician recommends that you schedule a follow-up appointment in: 4 months with Dr. Harl Bowie  Your physician recommends that you continue on your current medications as directed. Please refer to the Current Medication list given to you today.  You have been referred to Dr. Luan Pulling for Sleep Apnea   Thank you for choosing [facility]!

## 2014-08-06 NOTE — Progress Notes (Signed)
Clinical Summary [name] is a 59 y.o.male seen today for follow up of the following medical problems.   1. CAD/ICM  - Echo 11/2011 LVEF 15-20%,  - CABG 09/2011 x 6 vessels (LIMA-LAD with sequential SVG to OM1, OM2, OM3. SVG-PDA o LCX, SVG to RCA.  - he has an ICD, Medtronic dual chamber ,that is followed by EP Dr. Lovena Le. Normal function on last check 04/2014.  - no chest pain. <1/2 block DOE which is stable, no orthopnea, no PND, no LE edema  - compliant with meds. Limiting sodium. Takes mobic only occasionally, no other NSADis.  - compliant with meds. Can have some orthostatic symptoms, however tolerable.   2. Mitral valve regurgitation - repair with 28 mm annuloplasty ring 09/2011 - denies any LE edema, no SOB or DOE  3. Hyperlipidemia - compliant with statin  4. OSA screen - no daytime somnolence, + snoring, +apneic episodes  Past Medical History  Diagnosis Date  . Cerebrovascular disease     h/o CVA in 07/2011 + left carotid endarterectomy  . Hyperlipemia   . Pneumonia   . COPD (chronic obstructive pulmonary disease)     on 2L O2 at home since 08/2011  . 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 ICD (implantable cardiac defibrillator) in place   . Coronary artery disease   . CHF (congestive heart failure)   . Myocardial infarction 08/2011  . Stroke 07/2011    denies residual (06/14/2012)  . Chronic lower back pain   . Arthritis     "back; knees" (06/14/2012)  . DDD (degenerative disc disease), lumbosacral   . Depression 09/2011    "after OHS" (06/14/2012)  . Cholelithiasis 07/05/2012    Asymptomatic; identified on CT scanning of the chest;  . Other primary cardiomyopathies      Allergies  Allergen Reactions  . Metoprolol Shortness Of Breath and Nausea And Vomiting  . Peach Flavor Hives and Itching    "peaches; peach flavoring; actually happened after he ate a store-bought peach pie"  . Morphine  And Related Hives and Itching     Current Outpatient Prescriptions  Medication Sig Dispense Refill  . albuterol (PROVENTIL HFA;VENTOLIN HFA) 108 (90 BASE) MCG/ACT inhaler Inhale 1 puff into the lungs every 6 (six) hours as needed for wheezing or shortness of breath. 1 Inhaler 2  . aspirin 81 MG tablet Take 81 mg by mouth daily.    Marland Kitchen atorvastatin (LIPITOR) 80 MG tablet Take 1 tablet (80 mg total) by mouth daily. 90 tablet 3  . carvedilol (COREG) 12.5 MG tablet Take (1 tablet)12.5 mg in the morning and ( 2 tablets) 25 mg at night 90 tablet 6  . dextromethorphan (DELSYM) 30 MG/5ML liquid Take by mouth as needed for cough.    . docusate sodium (COLACE) 100 MG capsule Take 100 mg by mouth daily.    Marland Kitchen EPIPEN 2-PAK 0.3 MG/0.3ML SOAJ injection     . escitalopram (LEXAPRO) 20 MG tablet Take 20 mg by mouth daily.    . furosemide (LASIX) 20 MG tablet Take one half tablet by mouth daily 30 tablet 2  . HYDROcodone-acetaminophen (NORCO) 10-325 MG per tablet Take 1 tablet by mouth every 8 (eight) hours as needed. Pain 21 tablet 0  . lisinopril (PRINIVIL,ZESTRIL) 5 MG tablet Take 1 tablet (5 mg total) by mouth daily. 30 tablet 11  . meloxicam (MOBIC) 15 MG tablet Take 15 mg by mouth daily.    Marland Kitchen  spironolactone (ALDACTONE) 25 MG tablet Take 0.5 tablets (12.5 mg total) by mouth daily. 90 tablet 3   No current facility-administered medications for this visit.     Past Surgical History  Procedure Laterality Date  . Back surgery    . Knee arthroscopy  2002    "right" (06/14/2012)  . Foreign body removal  06/05/2011    Procedure: FOREIGN BODY REMOVAL ADULT;  Surgeon: Hermelinda Dellen;  Location: [facility];  Service: Plastics;  Laterality: Right;  removal foreign body from right side face   . Carotid endarterectomy  07/2011    "left" (06/14/2012)  . Tee without cardioversion  10/17/2011    Procedure: TRANSESOPHAGEAL ECHOCARDIOGRAM (TEE);  Surgeon: Jolaine Artist, MD;  Location: [facility]  ENDOSCOPY;  Service: Cardiovascular;  Laterality: N/A;  . Coronary artery bypass graft  10/20/2011    Procedure: CORONARY ARTERY BYPASS GRAFTING (CABG);  Surgeon: [name], MD;  Location: [facility];  Service: Open Heart Surgery;  Laterality: N/A;  CABG x six;  using left internal mammary artery and right leg greater saphenous vein harvested endoscopically  . Mitral valve repair  10/20/2011    Procedure: MITRAL VALVE REPAIR (MVR);  Surgeon: [name], MD;  Location: [facility];  Service: Open Heart Surgery;  Laterality: N/A;  . Cardiac defibrillator placement  06/14/2012  . Posterior lumbar fusion  1996  . Lumbar disc surgery  2012  . Cardiac catheterization  09/2011  . Left and right heart catheterization with coronary angiogram N/A 10/16/2011    Procedure: LEFT AND RIGHT HEART CATHETERIZATION WITH CORONARY ANGIOGRAM;  Surgeon: Jolaine Artist, MD;  Location: [facility] CATH LAB;  Service: Cardiovascular;  Laterality: N/A;  . Implantable cardioverter defibrillator implant N/A 06/14/2012    Procedure: IMPLANTABLE CARDIOVERTER DEFIBRILLATOR IMPLANT;  Surgeon: Evans Lance, MD;  Location: [facility] CATH LAB;  Service: Cardiovascular;  Laterality: N/A;     Allergies  Allergen Reactions  . Metoprolol Shortness Of Breath and Nausea And Vomiting  . Peach Flavor Hives and Itching    "peaches; peach flavoring; actually happened after he ate a store-bought peach pie"  . Morphine And Related Hives and Itching      Family History  Problem Relation Age of Onset  . Coronary artery disease Mother   . Coronary artery disease Father   . Arrhythmia Brother      Social History Charles Savage reports that he has been smoking Cigarettes.  He has a 63 pack-year smoking history. He has never used smokeless tobacco. Charles Savage reports that he does not drink alcohol.   Review of Systems CONSTITUTIONAL: No weight loss, fever, chills, weakness or fatigue.  HEENT: Eyes: No visual loss, blurred vision, double vision  or yellow sclerae.No hearing loss, sneezing, congestion, runny nose or sore throat.  SKIN: No rash or itching.  CARDIOVASCULAR: per HPI RESPIRATORY: No shortness of breath, cough or sputum.  GASTROINTESTINAL: No anorexia, nausea, vomiting or diarrhea. No abdominal pain or blood.  GENITOURINARY: No burning on urination, no polyuria NEUROLOGICAL: No headache, dizziness, syncope, paralysis, ataxia, numbness or tingling in the extremities. No change in bowel or bladder control.  MUSCULOSKELETAL: No muscle, back pain, joint pain or stiffness.  LYMPHATICS: No enlarged nodes. No history of splenectomy.  PSYCHIATRIC: No history of depression or anxiety.  ENDOCRINOLOGIC: No reports of sweating, cold or heat intolerance. No polyuria or polydipsia.  Marland Kitchen   Physical Examination p 68 bp 110/62 Wt 220 lbs BMI 31 Gen: resting comfortably, no acute distress  HEENT: no scleral icterus, pupils equal round and reactive, no palptable cervical adenopathy,  CV: RRR, no m/r/g, no JVD, no carotid bruits Resp: Clear to auscultation bilaterally GI: abdomen is soft, non-tender, non-distended, normal bowel sounds, no hepatosplenomegaly MSK: extremities are warm, no edema.  Skin: warm, no rash Neuro:  no focal deficits Psych: appropriate affect   Diagnostic Studies  11/2011 Echo  LVEF 15-20%, anular MV ring with trivial MR, severe RV dysfunction,  09/2011 TEE  LEFT VENTRICLE: EF = 25% Mildly dilated. Global HK.   RIGHT VENTRICLE: Moderately HK  LEFT ATRIUM: Moderately dilated  LEFT ATRIAL APPENDAGE: No thrombus  RIGHT ATRIUM: Normal  AORTIC VALVE: Trileaflet. Trivial AI. No AS.  MITRAL VALVE: Structurally normal. Mild to moderate MR.  TRICUSPID VALVE: Normal Trivial TR  PULMONIC VALVE: Normal  INTERATRIAL SEPTUM: No ASD or PFO.  PERICARDIUM: Moderate effusion along RA//RV. No tamponade.  DESCENDING AORTA: Severe plaque.  09/2011 Cath  Findings:  On milrinone 0.25 mg.kg/min  RA = 5  RV  = 30/5/7  PA = 30/16 (23)  PCW = 20 (no significant v-waves)  Fick cardiac output/index = 5.46/2.8  PVR = 0.5 [place]  SVR = 981  FA sat = 93%  PA sat = 67%, 71% (on milrinone)  Ao Pressure: 92/63 (76)  LV Pressure: 98/8/18  There was no signficant gradient across the aortic valve on pullback.  Left main: 80-90% distal  LAD: Flush occlusion at ostium. Mild filling in mid and distal segments through L to L and R to L collaterals  LCX: Large. Dominant. 70-80% mid AV groove. OM-1 small to moderate vessel with mild ostial disease. OM-2 very large 99% lesion in mid section at trifurcation. 90% lesion in lowest branch. PDA 50-60 ostial  RCA: Small to moderate-sized, non- dominant vessel ending with acute marginal branch. Diffuse 60% prox to mid. 95% midsection. R to L collats to LAD from acute marginal.  LV-gram done in the RAO projection: Ejection fraction = 25-30% with global HK 3+ MR  L subclavian: Widely patent to chest wall with mild plaquing  Assessment:  1. 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 Ischemic CM with EF 25-30%  3. 3+ mitral regurgitation  4. Well compensated hemodynamics on milrinone  Plan/Discussion:  Will need CABG/MVR. Check PFTs and TEE. Continue milrinone. Transfer stepdown. Consult TCTS.     Assessment and Plan  1. CAD/ICM/Chronic systolic HF - LVEF 81-85% by echo 11/2011, NYHA III, he has a Medtronic ICD  - titration of medications has been limited by low blood pressures and orthostatic symptoms - continue current meds  2. Mitral valve regurgitation - s/p repair, denies any current symptoms - continue to follow clinically  3. Hyperlipidemia - continue high dose statin in setting of CAD  4. OSA screen - signs and symptoms of possible OSA, will refer to Dr Luan Pulling for evaluation.     F/ u 4 months   Arnoldo Lenis, M.D.

## 2014-08-17 ENCOUNTER — Ambulatory Visit (INDEPENDENT_AMBULATORY_CARE_PROVIDER_SITE_OTHER): Payer: Medicare Other | Admitting: *Deleted

## 2014-08-17 DIAGNOSIS — I5022 Chronic systolic (congestive) heart failure: Secondary | ICD-10-CM

## 2014-08-17 DIAGNOSIS — I255 Ischemic cardiomyopathy: Secondary | ICD-10-CM

## 2014-08-17 LAB — MDC_IDC_ENUM_SESS_TYPE_REMOTE
Brady Statistic RV Percent Paced: 0.01 %
Date Time Interrogation Session: 20160222093725
HIGH POWER IMPEDANCE MEASURED VALUE: 72 Ohm
Lead Channel Impedance Value: 342 Ohm
Lead Channel Pacing Threshold Amplitude: 0.625 V
Lead Channel Pacing Threshold Pulse Width: 0.4 ms
Lead Channel Sensing Intrinsic Amplitude: 6.375 mV
Lead Channel Setting Pacing Amplitude: 2 V
Lead Channel Setting Sensing Sensitivity: 0.3 mV
MDC IDC MSMT BATTERY REMAINING LONGEVITY: 124 mo
MDC IDC MSMT BATTERY VOLTAGE: 3.03 V
MDC IDC MSMT LEADCHNL RV IMPEDANCE VALUE: 418 Ohm
MDC IDC MSMT LEADCHNL RV SENSING INTR AMPL: 6.375 mV
MDC IDC SET LEADCHNL RV PACING PULSEWIDTH: 0.4 ms
Zone Setting Detection Interval: 280 ms
Zone Setting Detection Interval: 360 ms
Zone Setting Detection Interval: 450 ms

## 2014-08-17 NOTE — Progress Notes (Signed)
Remote ICD transmission.   

## 2014-08-25 ENCOUNTER — Other Ambulatory Visit: Payer: Self-pay | Admitting: Adult Health

## 2014-08-28 ENCOUNTER — Encounter: Payer: Self-pay | Admitting: Cardiology

## 2014-09-01 ENCOUNTER — Ambulatory Visit: Payer: Self-pay | Admitting: Cardiology

## 2014-09-02 ENCOUNTER — Encounter: Payer: Self-pay | Admitting: Internal Medicine

## 2014-09-17 ENCOUNTER — Telehealth: Payer: Self-pay

## 2014-09-17 NOTE — Telephone Encounter (Signed)
Gastroenterology Pre-Procedure Review  Request Date: 09/17/2014 Requesting Physician: Collene Mares, PA  PATIENT REVIEW QUESTIONS: The patient responded to the following health history questions as indicated:    1. Diabetes Melitis: no 2. Joint replacements in the past 12 months: no 3. Major health problems in the past 3 months: no 4. Has an artificial valve or MVP: no 5. Has a defibrillator: YES     SEE SEPARATE INFORMATION ON THE SERIAL NUMBER ETC 6. Has been advised in past to take antibiotics in advance of a procedure like teeth cleaning: no    MEDICATIONS & ALLERGIES:    Patient reports the following regarding taking any blood thinners:   Plavix? no Aspirin? yes Coumadin? no  Patient confirms/reports the following medications:  Current Outpatient Prescriptions  Medication Sig Dispense Refill  . albuterol (PROVENTIL HFA;VENTOLIN HFA) 108 (90 BASE) MCG/ACT inhaler Inhale 1 puff into the lungs every 6 (six) hours as needed for wheezing or shortness of breath. 1 Inhaler 2  . aspirin 81 MG tablet Take 81 mg by mouth daily.    Marland Kitchen atorvastatin (LIPITOR) 80 MG tablet Take 1 tablet (80 mg total) by mouth daily. 90 tablet 3  . carvedilol (COREG) 12.5 MG tablet Take (1 tablet)12.5 mg in the morning and ( 2 tablets) 25 mg at night 90 tablet 6  . dextromethorphan (DELSYM) 30 MG/5ML liquid Take by mouth as needed for cough.    . docusate sodium (COLACE) 100 MG capsule Take 100 mg by mouth daily.    Marland Kitchen escitalopram (LEXAPRO) 20 MG tablet Take 20 mg by mouth daily.    . furosemide (LASIX) 20 MG tablet TAKE 1/2 A TABLET BY MOUTH EVERY DAY 45 tablet 3  . HYDROcodone-acetaminophen (NORCO) 10-325 MG per tablet Take 1 tablet by mouth every 8 (eight) hours as needed. Pain 21 tablet 0  . lisinopril (PRINIVIL,ZESTRIL) 5 MG tablet Take 1 tablet (5 mg total) by mouth daily. 30 tablet 11  . meloxicam (MOBIC) 15 MG tablet Take 15 mg by mouth daily.    Marland Kitchen spironolactone (ALDACTONE) 25 MG tablet Take 0.5  tablets (12.5 mg total) by mouth daily. 90 tablet 3  . EPIPEN 2-PAK 0.3 MG/0.3ML SOAJ injection      No current facility-administered medications for this visit.    Patient confirms/reports the following allergies:  Allergies  Allergen Reactions  . Metoprolol Shortness Of Breath and Nausea And Vomiting  . Peach Flavor Hives and Itching    "peaches; peach flavoring; actually happened after he ate a store-bought peach pie"  . Morphine And Related Hives and Itching    No orders of the defined types were placed in this encounter.      AUTHORIZATION INFORMATION Primary Insurance:  ID #: Group #:  Pre-Cert / Auth required:  Pre-Cert / Auth #:   Secondary Insurance:   ID #:  Group #:  Pre-Cert / Auth required: Pre-Cert / Auth #:   SCHEDULE INFORMATION: Procedure has been scheduled as follows:  Date:             Time:   Location:   This Gastroenterology Pre-Precedure Review Form is being routed to the following provider(s): R. Garfield Cornea, MD

## 2014-09-17 NOTE — Telephone Encounter (Signed)
DEFIBRILLATOR :   MEDTRONIC REF #  [id number] SN # [id number]

## 2014-09-22 NOTE — Telephone Encounter (Signed)
Appropriate, but need to clarify on procedure request that he has a defibrillator.

## 2014-09-23 ENCOUNTER — Other Ambulatory Visit: Payer: Self-pay

## 2014-09-23 DIAGNOSIS — Z1211 Encounter for screening for malignant neoplasm of colon: Secondary | ICD-10-CM

## 2014-09-28 MED ORDER — PEG-KCL-NACL-NASULF-NA ASC-C 100 G PO SOLR: 1.0000 | ORAL | Status: DC

## 2014-09-28 NOTE — Telephone Encounter (Signed)
Rx sent to pharmacy and instructions mailed to pt.  Defibrillator noted in special needs on order.

## 2014-10-12 ENCOUNTER — Telehealth: Payer: Self-pay

## 2014-10-12 MED ORDER — PEG 3350-KCL-NA BICARB-NACL 420 G PO SOLR: 4000.0000 mL | ORAL | Status: DC

## 2014-10-12 NOTE — Telephone Encounter (Signed)
  Received message Movie prep not on formulary. Sending trilye prep in and faxing instrucitons to [facility] for the Trilyte.

## 2014-10-12 NOTE — Telephone Encounter (Signed)
LMOM for pt to call ( mobile) and let me know that he got the message that he will have new instructions for Trilyte prep.

## 2014-10-12 NOTE — Telephone Encounter (Signed)
Pt's wife is aware of the new prep and the new instructions.

## 2014-10-13 ENCOUNTER — Telehealth: Payer: Self-pay

## 2014-10-13 NOTE — Telephone Encounter (Signed)
I called [employer] @ [phone] and spoke to Fransisco Beau who said that a PA is not required for a screening colonoscopy.  To pay 100 % with no copay.

## 2014-10-16 ENCOUNTER — Encounter (HOSPITAL_COMMUNITY): Payer: Self-pay

## 2014-10-16 ENCOUNTER — Ambulatory Visit (HOSPITAL_COMMUNITY)
Admission: RE | Admit: 2014-10-16 | Discharge: 2014-10-16 | Disposition: A | Discharge status: Home / Self Care | Payer: Medicare Other | Source: Ambulatory Visit | Attending: Internal Medicine | Admitting: Internal Medicine

## 2014-10-16 ENCOUNTER — Encounter (HOSPITAL_COMMUNITY)
Admission: RE | Disposition: A | Discharge status: Home / Self Care | Payer: Self-pay | Source: Ambulatory Visit | Attending: Internal Medicine

## 2014-10-16 DIAGNOSIS — M13862 Other specified arthritis, left knee: Secondary | ICD-10-CM | POA: Diagnosis not present

## 2014-10-16 DIAGNOSIS — I509 Heart failure, unspecified: Secondary | ICD-10-CM | POA: Insufficient documentation

## 2014-10-16 DIAGNOSIS — G8929 Other chronic pain: Secondary | ICD-10-CM | POA: Insufficient documentation

## 2014-10-16 DIAGNOSIS — M4690 Unspecified inflammatory spondylopathy, site unspecified: Secondary | ICD-10-CM | POA: Insufficient documentation

## 2014-10-16 DIAGNOSIS — I2510 Atherosclerotic heart disease of native coronary artery without angina pectoris: Secondary | ICD-10-CM | POA: Insufficient documentation

## 2014-10-16 DIAGNOSIS — F17210 Nicotine dependence, cigarettes, uncomplicated: Secondary | ICD-10-CM | POA: Insufficient documentation

## 2014-10-16 DIAGNOSIS — Z86010 Personal history of colonic polyps: Secondary | ICD-10-CM | POA: Insufficient documentation

## 2014-10-16 DIAGNOSIS — Z791 Long term (current) use of non-steroidal anti-inflammatories (NSAID): Secondary | ICD-10-CM | POA: Diagnosis not present

## 2014-10-16 DIAGNOSIS — K621 Rectal polyp: Secondary | ICD-10-CM

## 2014-10-16 DIAGNOSIS — Z1211 Encounter for screening for malignant neoplasm of colon: Secondary | ICD-10-CM

## 2014-10-16 DIAGNOSIS — Z79891 Long term (current) use of opiate analgesic: Secondary | ICD-10-CM | POA: Diagnosis not present

## 2014-10-16 DIAGNOSIS — F329 Major depressive disorder, single episode, unspecified: Secondary | ICD-10-CM | POA: Diagnosis not present

## 2014-10-16 DIAGNOSIS — Z981 Arthrodesis status: Secondary | ICD-10-CM | POA: Insufficient documentation

## 2014-10-16 DIAGNOSIS — M13861 Other specified arthritis, right knee: Secondary | ICD-10-CM | POA: Diagnosis not present

## 2014-10-16 DIAGNOSIS — E785 Hyperlipidemia, unspecified: Secondary | ICD-10-CM | POA: Insufficient documentation

## 2014-10-16 DIAGNOSIS — Z8673 Personal history of transient ischemic attack (TIA), and cerebral infarction without residual deficits: Secondary | ICD-10-CM | POA: Diagnosis not present

## 2014-10-16 DIAGNOSIS — Z79899 Other long term (current) drug therapy: Secondary | ICD-10-CM | POA: Diagnosis not present

## 2014-10-16 DIAGNOSIS — Z95810 Presence of automatic (implantable) cardiac defibrillator: Secondary | ICD-10-CM | POA: Diagnosis not present

## 2014-10-16 DIAGNOSIS — M545 Low back pain: Secondary | ICD-10-CM | POA: Insufficient documentation

## 2014-10-16 DIAGNOSIS — Z951 Presence of aortocoronary bypass graft: Secondary | ICD-10-CM | POA: Diagnosis not present

## 2014-10-16 DIAGNOSIS — I252 Old myocardial infarction: Secondary | ICD-10-CM | POA: Insufficient documentation

## 2014-10-16 DIAGNOSIS — J449 Chronic obstructive pulmonary disease, unspecified: Secondary | ICD-10-CM | POA: Insufficient documentation

## 2014-10-16 DIAGNOSIS — Z7982 Long term (current) use of aspirin: Secondary | ICD-10-CM | POA: Diagnosis not present

## 2014-10-16 HISTORY — PX: COLONOSCOPY: SHX5424

## 2014-10-16 SURGERY — COLONOSCOPY
Anesthesia: Moderate Sedation

## 2014-10-16 MED ORDER — ONDANSETRON HCL 4 MG/2ML IJ SOLN
INTRAMUSCULAR | Status: AC
  Filled 2014-10-16: qty 2

## 2014-10-16 MED ORDER — STERILE WATER FOR IRRIGATION IR SOLN
Status: DC | PRN
  Administered 2014-10-16: 08:00:00

## 2014-10-16 MED ORDER — SODIUM CHLORIDE 0.9 % IV SOLN
INTRAVENOUS | Status: DC
  Administered 2014-10-16: 08:00:00 via INTRAVENOUS

## 2014-10-16 MED ORDER — ONDANSETRON HCL 4 MG/2ML IJ SOLN
INTRAMUSCULAR | Status: DC | PRN
  Administered 2014-10-16: 4 mg via INTRAVENOUS

## 2014-10-16 MED ORDER — MEPERIDINE HCL 100 MG/ML IJ SOLN
INTRAMUSCULAR | Status: AC
  Filled 2014-10-16: qty 2

## 2014-10-16 MED ORDER — MIDAZOLAM HCL 5 MG/5ML IJ SOLN
INTRAMUSCULAR | Status: AC
  Filled 2014-10-16: qty 10

## 2014-10-16 MED ORDER — MIDAZOLAM HCL 5 MG/5ML IJ SOLN
INTRAMUSCULAR | Status: DC | PRN
  Administered 2014-10-16: 1 mg via INTRAVENOUS
  Administered 2014-10-16: 2 mg via INTRAVENOUS
  Administered 2014-10-16: 1 mg via INTRAVENOUS

## 2014-10-16 MED ORDER — MEPERIDINE HCL 100 MG/ML IJ SOLN
INTRAMUSCULAR | Status: DC | PRN
  Administered 2014-10-16: 25 mg via INTRAVENOUS
  Administered 2014-10-16: 50 mg via INTRAVENOUS

## 2014-10-16 NOTE — Op Note (Signed)
[facility] [address] [place], [address]   COLONOSCOPY PROCEDURE REPORT  PATIENT: Charles Savage, Charles Savage  MR#: [id number] BIRTHDATE: 22-Mar-1956 , 68  yrs. old GENDER: male ENDOSCOPIST: R.  Garfield Cornea, MD FACP [facility] REFERRED KG:OVPC Hilma Favors, M.D. PROCEDURE DATE:  2014-10-26 PROCEDURE:   Colonoscopy with biopsy INDICATIONS:    First-ever average risk colorectal cancer screening examination. MEDICATIONS:  Versed 4 mg IV and Demerol 75 mg IV in divided doses. Zofran IV. ASA CLASS:       Class III  CONSENT: The risks, benefits, alternatives and imponderables including but not limited to bleeding, perforation as well as the possibility of a missed lesion have been reviewed.  The potential for biopsy, lesion removal, etc. have also been discussed. Questions have been answered.  All parties agreeable.  Please see the history and physical in the medical record for more information.  DESCRIPTION OF PROCEDURE:   After the risks benefits and alternatives of the procedure were thoroughly explained, informed consent was obtained.  The digital rectal exam revealed no abnormalities of the rectum.   The EC-[id number] ([id number])  endoscope was introduced through the anus and advanced to the cecum, which was identified by both the appendix and ileocecal valve. No adverse events experienced.   The quality of the prep was adequate  The instrument was then slowly withdrawn as the colon was fully examined.      COLON FINDINGS: (3) diminutive polyps in the rectum at 8 cm from anal verge; otherwise, remainder of the rectal mucosa appeared normal.  The colonic mucosa appeared normal.  Retroflexion was performed. .  The rectal polyps were removed with cold biopsy technique  Withdrawal time=16 minutes 0 seconds.  The scope was withdrawn and the procedure completed. COMPLICATIONS: There were no immediate complications.  ENDOSCOPIC IMPRESSION: Rectal polyps?"removed as described  above. Otherwise, normal colonoscopy  RECOMMENDATIONS: Follow up on pathology.  eSigned:  R. Garfield Cornea, MD [facility] 26-Oct-2014 8:56 AM   cc:  CPT CODES: ICD CODES:  The ICD and CPT codes recommended by this software are interpretations from the data that the clinical staff has captured with the software.  The verification of the translation of this report to the ICD and CPT codes and modifiers is the sole responsibility of the health care institution and practicing physician where this report was generated.  [employer]. will not be held responsible for the validity of the ICD and CPT codes included on this report.  AMA assumes no liability for data contained or not contained herein. CPT is a Designer, television/film set of the [employer].  PATIENT NAME:  Charles Savage, Charles Savage MR#: [id number]

## 2014-10-16 NOTE — H&P (Signed)
'@LOGO' @   Primary Care Physician:  Purvis Kilts, MD Primary Gastroenterologist:  Dr. Gala Romney  Pre-Procedure History & Physical: HPI:  Charles Savage is a 59 y.o. male is here for a screening colonoscopy. No bowel symptoms. No prior colonoscopy. No family history of colon cancer. Patient has a defibrillator.  Past Medical History  Diagnosis Date  . Cerebrovascular disease     h/o CVA in 07/2011 + left carotid endarterectomy  . Hyperlipemia   . Pneumonia   . COPD (chronic obstructive pulmonary disease)     on 2L O2 at home since 08/2011  . 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 ICD (implantable cardiac defibrillator) in place   . Coronary artery disease   . CHF (congestive heart failure)   . Myocardial infarction 08/2011  . Stroke 07/2011    denies residual (06/14/2012)  . Chronic lower back pain   . Arthritis     "back; knees" (06/14/2012)  . DDD (degenerative disc disease), lumbosacral   . Depression 09/2011    "after OHS" (06/14/2012)  . Cholelithiasis 07/05/2012    Asymptomatic; identified on CT scanning of the chest;  . Other primary cardiomyopathies     Past Surgical History  Procedure Laterality Date  . Back surgery    . Knee arthroscopy  2002    "right" (06/14/2012)  . Foreign body removal  06/05/2011    Procedure: FOREIGN BODY REMOVAL ADULT;  Surgeon: Hermelinda Dellen;  Location: [place];  Service: Plastics;  Laterality: Right;  removal foreign body from right side face   . Carotid endarterectomy  07/2011    "left" (06/14/2012)  . Tee without cardioversion  10/17/2011    Procedure: TRANSESOPHAGEAL ECHOCARDIOGRAM (TEE);  Surgeon: Jolaine Artist, MD;  Location: [facility] ENDOSCOPY;  Service: Cardiovascular;  Laterality: N/A;  . Coronary artery bypass graft  10/20/2011    Procedure: CORONARY ARTERY BYPASS GRAFTING (CABG);  Surgeon: [name], MD;  Location: [place];  Service: Open Heart  Surgery;  Laterality: N/A;  CABG x six;  using left internal mammary artery and right leg greater saphenous vein harvested endoscopically  . Mitral valve repair  10/20/2011    Procedure: MITRAL VALVE REPAIR (MVR);  Surgeon: [name], MD;  Location: [place];  Service: Open Heart Surgery;  Laterality: N/A;  . Cardiac defibrillator placement  06/14/2012  . Posterior lumbar fusion  1996  . Lumbar disc surgery  2012  . Cardiac catheterization  09/2011  . Left and right heart catheterization with coronary angiogram N/A 10/16/2011    Procedure: LEFT AND RIGHT HEART CATHETERIZATION WITH CORONARY ANGIOGRAM;  Surgeon: Jolaine Artist, MD;  [facility] C[facility] LAB;  Service: Cardiovascular;  Laterality: N/A;  . Implantable cardioverter defibrillator implant N/A 06/14/2012    Procedure: IMPLANTABLE CARDIOVERTER DEFIBRILLATOR IMPLANT;  Surgeon: Evans Lance, MD;  Location: [facility] CATH LAB;  Service: Cardiovascular;  Laterality: N/A;    Prior to Admission medications   Medication Sig Start Date End Date Taking? Authorizing Provider  albuterol (PROVENTIL HFA;VENTOLIN HFA) 108 (90 BASE) MCG/ACT inhaler Inhale 1 puff into the lungs every 6 (six) hours as needed for wheezing or shortness of breath. 08/04/13  Yes Arnoldo Lenis, MD  aspirin 81 MG tablet Take 81 mg by mouth daily.   Yes Historical Provider, MD  atorvastatin (LIPITOR) 80 MG tablet Take 1 tablet (80 mg total) by mouth daily. 03/23/14  Yes Arnoldo Lenis, MD  carvedilol (COREG) 12.5 MG tablet Take (1 tablet)12.5 mg in the morning and ( 2 tablets) 25 mg at night Patient taking differently: Take 12.5-25 mg by mouth 2 (two) times daily with a meal. Take (1 tablet)12.5 mg in the morning and ( 2 tablets) 25 mg at night 03/27/14  Yes Arnoldo Lenis, MD  dextromethorphan (DELSYM) 30 MG/5ML liquid Take 30 mg by mouth as needed for cough.    Yes Historical Provider, MD  docusate sodium (COLACE) 100 MG capsule Take 100 mg by mouth daily.   Yes Historical  Provider, MD  escitalopram (LEXAPRO) 20 MG tablet Take 20 mg by mouth daily.   Yes Historical Provider, MD  furosemide (LASIX) 20 MG tablet TAKE 1/2 A TABLET BY MOUTH EVERY DAY 08/26/14  Yes Arnoldo Lenis, MD  HYDROcodone-acetaminophen [facility]) 10-325 MG per tablet Take 1 tablet by mouth every 8 (eight) hours as needed. Pain Patient taking differently: Take 1 tablet by mouth every 8 (eight) hours as needed for moderate pain. Pain 11/03/11  Yes Lendon Colonel, NP  lisinopril (PRINIVIL,ZESTRIL) 5 MG tablet Take 1 tablet (5 mg total) by mouth daily. 06/24/14  Yes Arnoldo Lenis, MD  meloxicam (MOBIC) 15 MG tablet Take 15 mg by mouth daily.   Yes Historical Provider, MD  peg 3350 powder (MOVIPREP) 100 G SOLR Take 1 kit (200 g total) by mouth as directed. 09/28/14  Yes Daneil Dolin, MD  polyethylene glycol-electrolytes (TRILYTE) 420 G solution Take 4,000 mLs by mouth as directed. 10/12/14  Yes Daneil Dolin, MD  spironolactone (ALDACTONE) 25 MG tablet Take 0.5 tablets (12.5 mg total) by mouth daily. 03/23/14  Yes Arnoldo Lenis, MD  EPIPEN 2-PAK 0.3 MG/0.3ML SOAJ injection Inject 0.3 mg as directed once.  05/19/13   Historical Provider, MD    Allergies as of 09/23/2014 - Review Complete 09/17/2014  Allergen Reaction Noted  . Metoprolol Shortness Of Breath and Nausea And Vomiting 10/09/2011  . Peach flavor Hives and Itching 10/09/2011  . Morphine and related Hives and Itching 09/02/2010    Family History  Problem Relation Age of Onset  . Coronary artery disease Mother   . Coronary artery disease Father   . Arrhythmia Brother     History   Social History  . Marital Status: Married    Spouse Name: N/A  . Number of Children: N/A  . Years of Education: N/A   Occupational History  . Not on file.   Social History Main Topics  . Smoking status: Current Every Day Smoker -- 1.50 packs/day for 42 years    Types: Cigarettes    Start date: 12/12/1969  . Smokeless tobacco: Never Used      Comment: quit from 09-10-2011 and started back smoking 03-26-2012  . 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 Drug Use: No  . Sexual Activity: No     Comment: Quit drinking alcohol 2 yrs ago   Other Topics Concern  . 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    Review of Systems: See HPI, otherwise negative ROS  Physical Exam: BP 121/73 mmHg  Temp(Src) 97.6 F (36.4 C) (Oral)  Resp 22  Ht '5\' 11"'  (1.803 m)  Wt 217 lb (98.431 kg)  BMI 30.28 kg/m2  SpO2 98% General:   Alert,  Well-developed, well-nourished, pleasant and cooperative  in NAD Head:  Normocephalic and atraumatic. Eyes:  Sclera clear, no icterus.   Conjunctiva pink. Ears:  Normal auditory acuity. Nose:  No deformity, discharge,  or lesions. Mouth:  No deformity or lesions, dentition normal. Neck:  Supple; no masses or thyromegaly. Lungs:  Clear throughout to auscultation.   No wheezes, crackles, or rhonchi. No acute distress. Heart:  Regular rate and rhythm; no murmurs, clicks, rubs,  or gallops. Abdomen:  Soft, nontender and nondistended. No masses, hepatosplenomegaly or hernias noted. Normal bowel sounds, without guarding, and without rebound.   Msk:  Symmetrical without gross deformities. Normal posture. Pulses:  Normal pulses noted. Extremities:  Without clubbing or edema. Neurologic:  Alert and  oriented x4;  grossly normal neurologically. Skin:  Intact without significant lesions or rashes. Cervical Nodes:  No significant cervical adenopathy. Psych:  Alert and cooperative. Normal mood and affect.  Impression/Plan: LADARRELL CORNWALL is now here to undergo a screening colonoscopy.  First ever average risk screening examination. Potential for defibrillator de-activation if cautery needed reviewed patient Risks, benefits, limitations, imponderables and alternatives regarding colonoscopy have been  reviewed with the patient. Questions have been answered. All parties agreeable.     Notice:  This dictation was prepared with Dragon dictation along with smaller phrase technology. Any transcriptional errors that result from this process are unintentional and may not be corrected upon review.

## 2014-10-16 NOTE — Discharge Instructions (Signed)

## 2014-10-19 ENCOUNTER — Encounter (HOSPITAL_COMMUNITY): Payer: Self-pay | Admitting: Internal Medicine

## 2014-10-20 ENCOUNTER — Encounter: Payer: Self-pay | Admitting: Internal Medicine

## 2014-10-20 ENCOUNTER — Other Ambulatory Visit: Payer: Self-pay | Admitting: Cardiology

## 2014-10-20 MED ORDER — CARVEDILOL 12.5 MG PO TABS: ORAL_TABLET | ORAL | Status: DC

## 2014-10-20 NOTE — Telephone Encounter (Signed)
Received fax refill request  Rx # [id number] Medication:  Carvedilol 12.5 mg tablet Qty 90 Sig:  Take one tablet 12.5 mg in the morning and 2 tablets 25 mg at night Physician:  Harl Bowie

## 2014-11-27 ENCOUNTER — Encounter: Payer: Self-pay | Admitting: Internal Medicine

## 2014-11-27 ENCOUNTER — Encounter: Payer: Medicare Other | Admitting: Cardiology

## 2014-11-27 ENCOUNTER — Encounter: Payer: Self-pay | Admitting: Cardiology

## 2014-11-27 NOTE — Progress Notes (Signed)
Clinical Summary Charles Savage is a 59 y.o.male seen today for follow up of the following medical problems.  1. CAD/ICM  - Echo 11/2011 LVEF 15-20%,  - CABG 09/2011 x 6 vessels (LIMA-LAD with sequential SVG to OM1, OM2, OM3. SVG-PDA o LCX, SVG to RCA.  - he has an ICD, Medtronic dual chamber ,that is followed by EP Dr. Lovena Le. Normal function on last check 07/2014.  - no chest pain. <1/2 block DOE which is stable, no orthopnea, no PND, no LE edema  - compliant with meds. Limiting sodium. Takes mobic only occasionally, no other NSADis.  - compliant with meds. Can have some orthostatic symptoms, however tolerable.   2. Mitral valve regurgitation - repair with 28 mm annuloplasty ring 09/2011 - denies any LE edema, no SOB or DOE  3. Hyperlipidemia - compliant with statin  4. OSA screen - no daytime somnolence, + snoring, +apneic episodes   Past Medical History  Diagnosis Date  . Cerebrovascular disease     h/o CVA in 07/2011 + left carotid endarterectomy  . Hyperlipemia   . Pneumonia   . COPD (chronic obstructive pulmonary disease)     on 2L O2 at home since 08/2011  . 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 ICD (implantable cardiac defibrillator) in place   . Coronary artery disease   . CHF (congestive heart failure)   . Myocardial infarction 08/2011  . Stroke 07/2011    denies residual (06/14/2012)  . Chronic lower back pain   . Arthritis     "back; knees" (06/14/2012)  . DDD (degenerative disc disease), lumbosacral   . Depression 09/2011    "after OHS" (06/14/2012)  . Cholelithiasis 07/05/2012    Asymptomatic; identified on CT scanning of the chest;  . Other primary cardiomyopathies      Allergies  Allergen Reactions  . Metoprolol Shortness Of Breath and Nausea And Vomiting  . Peach Flavor Hives and Itching    "peaches; peach flavoring; actually happened after he ate a store-bought peach pie"  . Morphine  And Related Hives and Itching     Current Outpatient Prescriptions  Medication Sig Dispense Refill  . albuterol (PROVENTIL HFA;VENTOLIN HFA) 108 (90 BASE) MCG/ACT inhaler Inhale 1 puff into the lungs every 6 (six) hours as needed for wheezing or shortness of breath. 1 Inhaler 2  . aspirin 81 MG tablet Take 81 mg by mouth daily.    Marland Kitchen atorvastatin (LIPITOR) 80 MG tablet Take 1 tablet (80 mg total) by mouth daily. 90 tablet 3  . carvedilol (COREG) 12.5 MG tablet Take (1 tablet)12.5 mg in the morning and ( 2 tablets) 25 mg at night 90 tablet 6  . dextromethorphan (DELSYM) 30 MG/5ML liquid Take 30 mg by mouth as needed for cough.     . docusate sodium (COLACE) 100 MG capsule Take 100 mg by mouth daily.    Marland Kitchen EPIPEN 2-PAK 0.3 MG/0.3ML SOAJ injection Inject 0.3 mg as directed once.     . escitalopram (LEXAPRO) 20 MG tablet Take 20 mg by mouth daily.    . furosemide (LASIX) 20 MG tablet TAKE 1/2 A TABLET BY MOUTH EVERY DAY 45 tablet 3  . HYDROcodone-acetaminophen (NORCO) 10-325 MG per tablet Take 1 tablet by mouth every 8 (eight) hours as needed. Pain (Patient taking differently: Take 1 tablet by mouth every 8 (eight) hours as needed for moderate pain. Pain) 21 tablet 0  . lisinopril (PRINIVIL,ZESTRIL) 5  MG tablet Take 1 tablet (5 mg total) by mouth daily. 30 tablet 11  . meloxicam (MOBIC) 15 MG tablet Take 15 mg by mouth daily.    . peg 3350 powder (MOVIPREP) 100 G SOLR Take 1 kit (200 g total) by mouth as directed. 1 kit 0  . polyethylene glycol-electrolytes (TRILYTE) 420 G solution Take 4,000 mLs by mouth as directed. 4000 mL 0  . spironolactone (ALDACTONE) 25 MG tablet Take 0.5 tablets (12.5 mg total) by mouth daily. 90 tablet 3   No current facility-administered medications for this visit.     Past Surgical History  Procedure Laterality Date  . Back surgery    . Knee arthroscopy  2002    "right" (06/14/2012)  . Foreign body removal  06/05/2011    Procedure: FOREIGN BODY REMOVAL ADULT;   Surgeon: Hermelinda Dellen;  Location: [place];  Service: Plastics;  Laterality: Right;  removal foreign body from right side face   . Carotid endarterectomy  07/2011    "left" (06/14/2012)  . Tee without cardioversion  10/17/2011    Procedure: TRANSESOPHAGEAL ECHOCARDIOGRAM (TEE);  Surgeon: Jolaine Artist, MD;  Location: [facility] ENDOSCOPY;  Service: Cardiovascular;  Laterality: N/A;  . Coronary artery bypass graft  10/20/2011    Procedure: CORONARY ARTERY BYPASS GRAFTING (CABG);  Surgeon: [name], MD;  Location: [place];  Service: Open Heart Surgery;  Laterality: N/A;  CABG x six;  using left internal mammary artery and right leg greater saphenous vein harvested endoscopically  . Mitral valve repair  10/20/2011    Procedure: MITRAL VALVE REPAIR (MVR);  Surgeon: [name], MD;  Location: [place];  Service: Open Heart Surgery;  Laterality: N/A;  . Cardiac defibrillator placement  06/14/2012  . Posterior lumbar fusion  1996  . Lumbar disc surgery  2012  . Cardiac catheterization  09/2011  . Left and right heart catheterization with coronary angiogram N/A 10/16/2011    Procedure: LEFT AND RIGHT HEART CATHETERIZATION WITH CORONARY ANGIOGRAM;  Surgeon: Jolaine Artist, MD;  Location: [facility] CATH LAB;  Service: Cardiovascular;  Laterality: N/A;  . Implantable cardioverter defibrillator implant N/A 06/14/2012    Procedure: IMPLANTABLE CARDIOVERTER DEFIBRILLATOR IMPLANT;  Surgeon: Evans Lance, MD;  Location: [facility] CATH LAB;  Service: Cardiovascular;  Laterality: N/A;  . Colonoscopy N/A 10/16/2014    Procedure: COLONOSCOPY;  Surgeon: Daneil Dolin, MD;  Location: [facility];  Service: Endoscopy;  Laterality: N/A;  8:00 AM     Allergies  Allergen Reactions  . Metoprolol Shortness Of Breath and Nausea And Vomiting  . Peach Flavor Hives and Itching    "peaches; peach flavoring; actually happened after he ate a store-bought peach pie"  . Morphine And Related Hives and Itching       Family History  Problem Relation Age of Onset  . Coronary artery disease Mother   . Coronary artery disease Father   . Arrhythmia Brother      Social History Mr. Nicholl reports that he has been smoking Cigarettes.  He started smoking about 44 years ago. He has a 63 pack-year smoking history. He has never used smokeless tobacco. Mr. Stepanek reports that he does not drink alcohol.   Review of Systems CONSTITUTIONAL: No weight loss, fever, chills, weakness or fatigue.  HEENT: Eyes: No visual loss, blurred vision, double vision or yellow sclerae.No hearing loss, sneezing, congestion, runny nose or sore throat.  SKIN: No rash or itching.  CARDIOVASCULAR:  RESPIRATORY: No shortness of  breath, cough or sputum.  GASTROINTESTINAL: No anorexia, nausea, vomiting or diarrhea. No abdominal pain or blood.  GENITOURINARY: No burning on urination, no polyuria NEUROLOGICAL: No headache, dizziness, syncope, paralysis, ataxia, numbness or tingling in the extremities. No change in bowel or bladder control.  MUSCULOSKELETAL: No muscle, back pain, joint pain or stiffness.  LYMPHATICS: No enlarged nodes. No history of splenectomy.  PSYCHIATRIC: No history of depression or anxiety.  ENDOCRINOLOGIC: No reports of sweating, cold or heat intolerance. No polyuria or polydipsia.  Marland Kitchen   Physical Examination There were no vitals filed for this visit. There were no vitals filed for this visit.  Gen: resting comfortably, no acute distress HEENT: no scleral icterus, pupils equal round and reactive, no palptable cervical adenopathy,  CV Resp: Clear to auscultation bilaterally GI: abdomen is soft, non-tender, non-distended, normal bowel sounds, no hepatosplenomegaly MSK: extremities are warm, no edema.  Skin: warm, no rash Neuro:  no focal deficits Psych: appropriate affect   Diagnostic Studies 11/2011 Echo  LVEF 15-20%, anular MV ring with trivial MR, severe RV dysfunction,  09/2011 TEE  LEFT  VENTRICLE: EF = 25% Mildly dilated. Global HK.   RIGHT VENTRICLE: Moderately HK  LEFT ATRIUM: Moderately dilated  LEFT ATRIAL APPENDAGE: No thrombus  RIGHT ATRIUM: Normal  AORTIC VALVE: Trileaflet. Trivial AI. No AS.  MITRAL VALVE: Structurally normal. Mild to moderate MR.  TRICUSPID VALVE: Normal Trivial TR  PULMONIC VALVE: Normal  INTERATRIAL SEPTUM: No ASD or PFO.  PERICARDIUM: Moderate effusion along RA//RV. No tamponade.  DESCENDING AORTA: Severe plaque.  09/2011 Cath  Findings:  On milrinone 0.25 mg.kg/min  RA = 5  RV = 30/5/7  PA = 30/16 (23)  PCW = 20 (no significant v-waves)  Fick cardiac output/index = 5.46/2.8  PVR = 0.5 [place]  SVR = 981  FA sat = 93%  PA sat = 67%, 71% (on milrinone)  Ao Pressure: 92/63 (76)  LV Pressure: 98/8/18  There was no signficant gradient across the aortic valve on pullback.  Left main: 80-90% distal  LAD: Flush occlusion at ostium. Mild filling in mid and distal segments through L to L and R to L collaterals  LCX: Large. Dominant. 70-80% mid AV groove. OM-1 small to moderate vessel with mild ostial disease. OM-2 very large 99% lesion in mid section at trifurcation. 90% lesion in lowest Todd Argabright. PDA 50-60 ostial  RCA: Small to moderate-sized, non- dominant vessel ending with acute marginal Selam Pietsch. Diffuse 60% prox to mid. 95% midsection. R to L collats to LAD from acute marginal.  LV-gram done in the RAO projection: Ejection fraction = 25-30% with global HK 3+ MR  L subclavian: Widely patent to chest wall with mild plaquing  Assessment:  1. 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 Ischemic CM with EF 25-30%  3. 3+ mitral regurgitation  4. Well compensated hemodynamics on milrinone  Plan/Discussion:  Will need CABG/MVR. Check PFTs and TEE. Continue milrinone. Transfer stepdown. Consult TCTS.       Assessment and Plan   1. CAD/ICM/Chronic systolic HF - LVEF 29-51% by echo 11/2011,  NYHA III, he has a Medtronic ICD - titration of medications has been limited by low blood pressures and orthostatic symptoms - continue current meds  2. Mitral valve regurgitation - s/p repair, denies any current symptoms - continue to follow clinically  3. Hyperlipidemia - continue high dose statin in setting of CAD  4. OSA screen - signs and symptoms of possible OSA, will refer to Dr  Hawkins for evaluation.       Arnoldo Lenis, M.D.

## 2014-12-21 ENCOUNTER — Ambulatory Visit (INDEPENDENT_AMBULATORY_CARE_PROVIDER_SITE_OTHER): Payer: Medicare Other | Admitting: *Deleted

## 2014-12-21 DIAGNOSIS — I5022 Chronic systolic (congestive) heart failure: Secondary | ICD-10-CM | POA: Diagnosis not present

## 2014-12-21 DIAGNOSIS — I255 Ischemic cardiomyopathy: Secondary | ICD-10-CM

## 2014-12-21 NOTE — Progress Notes (Signed)
Remote ICD transmission.   

## 2014-12-24 LAB — CUP PACEART REMOTE DEVICE CHECK
Battery Remaining Longevity: 120 mo
Battery Voltage: 2.98 V
Brady Statistic RV Percent Paced: 0.01 %
Date Time Interrogation Session: 20160627083827
HIGH POWER IMPEDANCE MEASURED VALUE: 75 Ohm
Lead Channel Pacing Threshold Amplitude: 0.75 V
Lead Channel Pacing Threshold Pulse Width: 0.4 ms
Lead Channel Sensing Intrinsic Amplitude: 6.875 mV
Lead Channel Setting Pacing Amplitude: 2 V
Lead Channel Setting Sensing Sensitivity: 0.3 mV
MDC IDC MSMT LEADCHNL RV IMPEDANCE VALUE: 399 Ohm
MDC IDC MSMT LEADCHNL RV IMPEDANCE VALUE: 456 Ohm
MDC IDC MSMT LEADCHNL RV SENSING INTR AMPL: 6.875 mV
MDC IDC SET LEADCHNL RV PACING PULSEWIDTH: 0.4 ms
MDC IDC SET ZONE DETECTION INTERVAL: 360 ms
MDC IDC SET ZONE DETECTION INTERVAL: 450 ms
Zone Setting Detection Interval: 280 ms

## 2015-01-07 ENCOUNTER — Other Ambulatory Visit (HOSPITAL_COMMUNITY): Payer: Self-pay | Admitting: Family Medicine

## 2015-01-07 DIAGNOSIS — M5440 Lumbago with sciatica, unspecified side: Secondary | ICD-10-CM

## 2015-01-19 ENCOUNTER — Other Ambulatory Visit (HOSPITAL_COMMUNITY): Payer: Medicare Other

## 2015-01-20 ENCOUNTER — Encounter: Payer: Self-pay | Admitting: *Deleted

## 2015-01-22 ENCOUNTER — Other Ambulatory Visit (HOSPITAL_COMMUNITY): Payer: Medicare Other

## 2015-01-25 ENCOUNTER — Encounter: Payer: Self-pay | Admitting: Internal Medicine

## 2015-01-26 ENCOUNTER — Ambulatory Visit: Payer: Medicare Other | Admitting: Cardiology

## 2015-02-12 ENCOUNTER — Institutional Professional Consult (permissible substitution): Payer: Medicare Other | Admitting: Neurology

## 2015-02-19 ENCOUNTER — Ambulatory Visit: Payer: Medicare Other | Admitting: Cardiology

## 2015-03-02 ENCOUNTER — Encounter: Payer: Self-pay | Admitting: *Deleted

## 2015-03-02 ENCOUNTER — Other Ambulatory Visit: Payer: Self-pay | Admitting: *Deleted

## 2015-03-02 MED ORDER — ATORVASTATIN CALCIUM 80 MG PO TABS: 80.0000 mg | ORAL_TABLET | Freq: Every day | ORAL | Status: DC

## 2015-03-22 ENCOUNTER — Encounter: Payer: Medicare Other | Admitting: *Deleted

## 2015-03-22 ENCOUNTER — Telehealth: Payer: Self-pay | Admitting: Cardiology

## 2015-03-22 NOTE — Telephone Encounter (Signed)
LMOVM reminding pt to send remote transmission.   

## 2015-03-23 ENCOUNTER — Encounter: Payer: Self-pay | Admitting: Cardiology

## 2015-03-29 ENCOUNTER — Ambulatory Visit (INDEPENDENT_AMBULATORY_CARE_PROVIDER_SITE_OTHER): Payer: Medicare Other | Admitting: *Deleted

## 2015-03-29 DIAGNOSIS — I255 Ischemic cardiomyopathy: Secondary | ICD-10-CM | POA: Diagnosis not present

## 2015-03-31 NOTE — Progress Notes (Signed)
Remote ICD transmission.   

## 2015-04-02 LAB — CUP PACEART REMOTE DEVICE CHECK
Battery Remaining Longevity: 119 mo
Brady Statistic RV Percent Paced: 0.01 %
HighPow Impedance: 81 Ohm
Lead Channel Impedance Value: 475 Ohm
Lead Channel Pacing Threshold Amplitude: 0.625 V
Lead Channel Pacing Threshold Pulse Width: 0.4 ms
Lead Channel Sensing Intrinsic Amplitude: 7.25 mV
Lead Channel Setting Pacing Amplitude: 2 V
Lead Channel Setting Pacing Pulse Width: 0.4 ms
Lead Channel Setting Sensing Sensitivity: 0.3 mV
MDC IDC MSMT BATTERY VOLTAGE: 2.99 V
MDC IDC MSMT LEADCHNL RV IMPEDANCE VALUE: 418 Ohm
MDC IDC MSMT LEADCHNL RV SENSING INTR AMPL: 7.25 mV
MDC IDC SESS DTM: 20161003210544
MDC IDC SET ZONE DETECTION INTERVAL: 280 ms
Zone Setting Detection Interval: 360 ms
Zone Setting Detection Interval: 450 ms

## 2015-04-07 ENCOUNTER — Encounter: Payer: Self-pay | Admitting: Cardiology

## 2015-04-09 ENCOUNTER — Ambulatory Visit: Payer: Medicare Other | Admitting: Cardiology

## 2015-04-19 ENCOUNTER — Encounter: Payer: Self-pay | Admitting: Internal Medicine

## 2015-05-05 ENCOUNTER — Encounter: Payer: Self-pay | Admitting: Cardiology

## 2015-05-05 ENCOUNTER — Ambulatory Visit (INDEPENDENT_AMBULATORY_CARE_PROVIDER_SITE_OTHER): Payer: Medicare Other | Admitting: Cardiology

## 2015-05-05 VITALS — BP 110/60 | HR 70 | Ht 68.0 in | Wt 223.8 lb

## 2015-05-05 DIAGNOSIS — I059 Rheumatic mitral valve disease, unspecified: Secondary | ICD-10-CM

## 2015-05-05 DIAGNOSIS — I5022 Chronic systolic (congestive) heart failure: Secondary | ICD-10-CM | POA: Diagnosis not present

## 2015-05-05 DIAGNOSIS — E785 Hyperlipidemia, unspecified: Secondary | ICD-10-CM | POA: Diagnosis not present

## 2015-05-05 DIAGNOSIS — G4730 Sleep apnea, unspecified: Secondary | ICD-10-CM

## 2015-05-05 NOTE — Patient Instructions (Addendum)
Your physician recommends that you schedule a follow-up appointment in: 2 months with Dr Harl Bowie     INCREASE Coreg to 12.5 mg ( 1 pill ) in the am and 25 mg ( 2 pills) in the pm   You have been referred to Dr Luan Pulling for evaluation for sleep apnea    If you need a refill on your cardiac medications before your next appointment, please call your pharmacy.      Thank you for choosing [place] !

## 2015-05-05 NOTE — Progress Notes (Signed)
Patient ID: Charles Savage, male   DOB: 13-Jun-1956, 59 y.o.   MRN: [id number]     Clinical Summary [name] is a 59 y.o.male seen today for follow up of the following medical problems.   1. CAD/ICM  - Echo 11/2011 LVEF 15-20%,  - CABG 09/2011 x 6 vessels (LIMA-LAD with sequential SVG to OM1, OM2, OM3. SVG-PDA o LCX, SVG to RCA.  - he has an ICD, Medtronic dual chamber ,that is followed by EP Dr. Lovena Le. Normal function on last check 03/2015.   - denies any chest pain. Stable DOE at 1/2 block, though also limited by back pain. No LE edema, no orthopnea.  - compliant with meds, however he is only taking coreg 12.26m bid instead of 12.535min AM and 2567mn PM. Limiting sodium intake.    2. Mitral valve regurgitation - repair with 28 mm annuloplasty ring 09/2011 - denies any LE edema, no SOB or DOE  3. Hyperlipidemia - compliant with statin  4. OSA screen - no daytime somnolence, + snoring, +apneic episodes Past Medical History  Diagnosis Date  . Cerebrovascular disease     h/o CVA in 07/2011 + left carotid endarterectomy  . Hyperlipemia   . Pneumonia   . COPD (chronic obstructive pulmonary disease)     on 2L O2 at home since 08/2011  . 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 ICD (implantable cardiac defibrillator) in place   . Coronary artery disease   . CHF (congestive heart failure)   . [place]  . Stroke 07/2011    denies residual (06/14/2012)  . Chronic lower back pain   . Arthritis     "back; knees" (06/14/2012)  . DDD (degenerative disc disease), lumbosacral   . Depression 09/2011    "after OHS" (06/14/2012)  . Cholelithiasis 07/05/2012    Asymptomatic; identified on CT scanning of the chest;  . Other primary cardiomyopathies      Allergies  Allergen Reactions  . Metoprolol Shortness Of Breath and Nausea And Vomiting  . Peach Flavor Hives and Itching    "peaches; peach  flavoring; actually happened after he ate a store-bought peach pie"  . Morphine And Related Hives and Itching     Current Outpatient Prescriptions  Medication Sig Dispense Refill  . albuterol (PROVENTIL HFA;VENTOLIN HFA) 108 (90 BASE) MCG/ACT inhaler Inhale 1 puff into the lungs every 6 (six) hours as needed for wheezing or shortness of breath. 1 Inhaler 2  . aspirin 81 MG tablet Take 81 mg by mouth daily.    . aMarland Kitchenorvastatin (LIPITOR) 80 MG tablet Take 1 tablet (80 mg total) by mouth daily. 90 tablet 0  . carvedilol (COREG) 12.5 MG tablet Take (1 tablet)12.5 mg in the morning and ( 2 tablets) 25 mg at night 90 tablet 6  . dextromethorphan (DELSYM) 30 MG/5ML liquid Take 30 mg by mouth as needed for cough.     . docusate sodium (COLACE) 100 MG capsule Take 100 mg by mouth daily.    . EMarland KitchenIPEN 2-PAK 0.3 MG/0.3ML SOAJ injection Inject 0.3 mg as directed once.     . escitalopram (LEXAPRO) 20 MG tablet Take 20 mg by mouth daily.    . furosemide (LASIX) 20 MG tablet TAKE 1/2 A TABLET BY MOUTH EVERY DAY 45 tablet 3  . HYDROcodone-acetaminophen (NORCO) 10-325 MG per tablet Take 1 tablet by mouth every 8 (eight) hours as needed. Pain (Patient taking differently: Take  1 tablet by mouth every 8 (eight) hours as needed for moderate pain. Pain) 21 tablet 0  . lisinopril (PRINIVIL,ZESTRIL) 5 MG tablet Take 1 tablet (5 mg total) by mouth daily. 30 tablet 11  . meloxicam (MOBIC) 15 MG tablet Take 15 mg by mouth daily.    . peg 3350 powder (MOVIPREP) 100 G SOLR Take 1 kit (200 g total) by mouth as directed. 1 kit 0  . polyethylene glycol-electrolytes (TRILYTE) 420 G solution Take 4,000 mLs by mouth as directed. 4000 mL 0  . spironolactone (ALDACTONE) 25 MG tablet Take 0.5 tablets (12.5 mg total) by mouth daily. 90 tablet 3   No current facility-administered medications for this visit.     Past Surgical History  Procedure Laterality Date  . Back surgery    . Knee arthroscopy  2002    "right" (06/14/2012)    . Foreign body removal  06/05/2011    Procedure: FOREIGN BODY REMOVAL ADULT;  Surgeon: Hermelinda Dellen;  Location: [place];  Service: Plastics;  Laterality: Right;  removal foreign body from right side face   . Carotid endarterectomy  07/2011    "left" (06/14/2012)  . Tee without cardioversion  10/17/2011    Procedure: TRANSESOPHAGEAL ECHOCARDIOGRAM (TEE);  Surgeon: Jolaine Artist, MD;  Location: [facility] ENDOSCOPY;  Service: Cardiovascular;  Laterality: N/A;  . Coronary artery bypass graft  10/20/2011    Procedure: CORONARY ARTERY BYPASS GRAFTING (CABG);  Surgeon: [name], MD;  Location: [place];  Service: Open Heart Surgery;  Laterality: N/A;  CABG x six;  using left internal mammary artery and right leg greater saphenous vein harvested endoscopically  . Mitral valve repair  10/20/2011    Procedure: MITRAL VALVE REPAIR (MVR);  Surgeon: [name], MD;  Location: [place];  Service: Open Heart Surgery;  Laterality: N/A;  . Cardiac defibrillator placement  06/14/2012  . Posterior lumbar fusion  1996  . Lumbar disc surgery  2012  . Cardiac catheterization  09/2011  . Left and right heart catheterization with coronary angiogram N/A 10/16/2011    Procedure: LEFT AND RIGHT HEART CATHETERIZATION WITH CORONARY ANGIOGRAM;  Surgeon: Jolaine Artist, MD;  Location: [facility] CATH LAB;  Service: Cardiovascular;  Laterality: N/A;  . Implantable cardioverter defibrillator implant N/A 06/14/2012    Procedure: IMPLANTABLE CARDIOVERTER DEFIBRILLATOR IMPLANT;  Surgeon: Evans Lance, MD;  Location[facility] CATH LAB;  Service: Cardiovascular;  Laterality: N/A;  . Colonoscopy N/A 10/16/2014    Procedure: COLONOSCOPY;  Surgeon: Daneil Dolin, MD;  [facility]O [facility];  Service: Endoscopy;  Laterality: N/A;  8:00 AM     Allergies  Allergen Reactions  . Metoprolol Shortness Of Breath and Nausea And Vomiting  . Peach Flavor Hives and Itching    "peaches; peach flavoring; actually happened after  he ate a store-bought peach pie"  . Morphine And Related Hives and Itching      Family History  Problem Relation Age of Onset  . Coronary artery disease Mother   . Coronary artery disease Father   . Arrhythmia Brother      Social History Mr. Brittian reports that he has been smoking Cigarettes.  He started smoking about 45 years ago. He has a 63 pack-year smoking history. He has never used smokeless tobacco. Mr. Fitzpatrick reports that he does not drink alcohol.   Review of Systems CONSTITUTIONAL: No weight loss, fever, chills, weakness or fatigue.  HEENT: Eyes: No visual loss, blurred vision, double vision or yellow  sclerae.No hearing loss, sneezing, congestion, runny nose or sore throat.  SKIN: No rash or itching.  CARDIOVASCULAR: per hPI RESPIRATORY: No  cough or sputum.  GASTROINTESTINAL: No anorexia, nausea, vomiting or diarrhea. No abdominal pain or blood.  GENITOURINARY: No burning on urination, no polyuria NEUROLOGICAL: No headache, dizziness, syncope, paralysis, ataxia, numbness or tingling in the extremities. No change in bowel or bladder control.  MUSCULOSKELETAL: No muscle, back pain, joint pain or stiffness.  LYMPHATICS: No enlarged nodes. No history of splenectomy.  PSYCHIATRIC: No history of depression or anxiety.  ENDOCRINOLOGIC: No reports of sweating, cold or heat intolerance. No polyuria or polydipsia.  Marland Kitchen   Physical Examination Filed Vitals:   05/05/15 1603  BP: 110/60  Pulse: 70   Filed Vitals:   05/05/15 1603  Height: '5\' 8"'  (1.727 m)  Weight: 223 lb 12.8 oz (101.515 kg)    Gen: resting comfortably, no acute distress HEENT: no scleral icterus, pupils equal round and reactive, no palptable cervical adenopathy,  CV: RRR, no m/r/g, no jvd Resp: Clear to auscultation bilaterally GI: abdomen is soft, non-tender, non-distended, normal bowel sounds, no hepatosplenomegaly MSK: extremities are warm, no edema.  Skin: warm, no rash Neuro:  no focal  deficits Psych: appropriate affect   Diagnostic Studies 11/2011 Echo  LVEF 15-20%, anular MV ring with trivial MR, severe RV dysfunction,  09/2011 TEE  LEFT VENTRICLE: EF = 25% Mildly dilated. Global HK.   RIGHT VENTRICLE: Moderately HK  LEFT ATRIUM: Moderately dilated  LEFT ATRIAL APPENDAGE: No thrombus  RIGHT ATRIUM: Normal  AORTIC VALVE: Trileaflet. Trivial AI. No AS.  MITRAL VALVE: Structurally normal. Mild to moderate MR.  TRICUSPID VALVE: Normal Trivial TR  PULMONIC VALVE: Normal  INTERATRIAL SEPTUM: No ASD or PFO.  PERICARDIUM: Moderate effusion along RA//RV. No tamponade.  DESCENDING AORTA: Severe plaque.  09/2011 Cath  Findings:  On milrinone 0.25 mg.kg/min  RA = 5  RV = 30/5/7  PA = 30/16 (23)  PCW = 20 (no significant v-waves)  Fick cardiac output/index = 5.46/2.8  PVR = 0.5 [place]  SVR = 981  FA sat = 93%  PA sat = 67%, 71% (on milrinone)  Ao Pressure: 92/63 (76)  LV Pressure: 98/8/18  There was no signficant gradient across the aortic valve on pullback.  Left main: 80-90% distal  LAD: Flush occlusion at ostium. Mild filling in mid and distal segments through L to L and R to L collaterals  LCX: Large. Dominant. 70-80% mid AV groove. OM-1 small to moderate vessel with mild ostial disease. OM-2 very large 99% lesion in mid section at trifurcation. 90% lesion in lowest Allisha Harter. PDA 50-60 ostial  RCA: Small to moderate-sized, non- dominant vessel ending with acute marginal Demtrius Rounds. Diffuse 60% prox to mid. 95% midsection. R to L collats to LAD from acute marginal.  LV-gram done in the RAO projection: Ejection fraction = 25-30% with global HK 3+ MR  L subclavian: Widely patent to chest wall with mild plaquing  Assessment:  1. 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 Ischemic CM with EF 25-30%  3. 3+ mitral regurgitation  4. Well compensated hemodynamics on milrinone  Plan/Discussion:  Will need CABG/MVR. Check  PFTs and TEE. Continue milrinone. Transfer stepdown. Consult TCTS.       Assessment and Plan   1. CAD/ICM/Chronic systolic HF - LVEF 40-81% by echo 11/2011, NYHA III, he has a Medtronic ICD - titration of medications has been limited by low blood pressures and orthostatic symptoms -  will try to increase coreg to 12.25m in AM and 221min PM  2. Mitral valve regurgitation - s/p repair, denies any current symptoms - continue to follow clinically  3. Hyperlipidemia - continue high dose statin in setting of CAD  4. OSA screen - signs and symptoms of possible OSA, will refer to Dr HaLuan Pullingor evaluation again as he did not make appt since last visit.     F/u 3 months  JoArnoldo LenisM.D.

## 2015-05-06 ENCOUNTER — Telehealth: Payer: Self-pay | Admitting: Cardiology

## 2015-05-06 NOTE — Telephone Encounter (Signed)
Pt is already taking is already taking 2 of his Coreg everyday, pt wanted Dr. Harl Bowie to know this since they discussed making that change yesterday

## 2015-05-06 NOTE — Telephone Encounter (Signed)
Pt should be taking 3 tabs daily, 12.5 mg in the am,and 2 tabs = 25 mg at night.LM with wife explaining to her

## 2015-06-02 ENCOUNTER — Other Ambulatory Visit: Payer: Self-pay | Admitting: Cardiology

## 2015-06-02 MED ORDER — SPIRONOLACTONE 25 MG PO TABS: 12.5000 mg | ORAL_TABLET | Freq: Every day | ORAL | Status: DC

## 2015-06-02 NOTE — Telephone Encounter (Signed)
Needs Spironolactone sent to [facility] / tg

## 2015-06-08 ENCOUNTER — Ambulatory Visit (INDEPENDENT_AMBULATORY_CARE_PROVIDER_SITE_OTHER): Payer: Medicare Other | Admitting: Internal Medicine

## 2015-06-08 ENCOUNTER — Encounter: Payer: Self-pay | Admitting: Internal Medicine

## 2015-06-08 VITALS — BP 110/62 | HR 63 | Ht 71.0 in | Wt 233.0 lb

## 2015-06-08 DIAGNOSIS — I5022 Chronic systolic (congestive) heart failure: Secondary | ICD-10-CM

## 2015-06-08 DIAGNOSIS — Z95810 Presence of automatic (implantable) cardiac defibrillator: Secondary | ICD-10-CM | POA: Diagnosis not present

## 2015-06-08 LAB — CUP PACEART INCLINIC DEVICE CHECK
Brady Statistic RV Percent Paced: 0.01 %
Date Time Interrogation Session: 20161213155557
HIGH POWER IMPEDANCE MEASURED VALUE: 84 Ohm
Implantable Lead Model: 6935
Lead Channel Impedance Value: 418 Ohm
Lead Channel Impedance Value: 513 Ohm
Lead Channel Pacing Threshold Amplitude: 0.625 V
Lead Channel Sensing Intrinsic Amplitude: 10.25 mV
Lead Channel Sensing Intrinsic Amplitude: 5.5 mV
Lead Channel Setting Pacing Pulse Width: 0.4 ms
Lead Channel Setting Sensing Sensitivity: 0.3 mV
MDC IDC LEAD IMPLANT DT: 20131220
MDC IDC LEAD LOCATION: 753860
MDC IDC MSMT BATTERY REMAINING LONGEVITY: 117 mo
MDC IDC MSMT BATTERY VOLTAGE: 3.02 V
MDC IDC MSMT LEADCHNL RV PACING THRESHOLD PULSEWIDTH: 0.4 ms
MDC IDC SET LEADCHNL RV PACING AMPLITUDE: 2 V

## 2015-06-08 NOTE — Assessment & Plan Note (Signed)
Smoking cessation is encouraged 

## 2015-06-08 NOTE — Progress Notes (Signed)
HPI Charles Savage returns today for followup. He is a very pleasant 59 year old man with an ischemic cardiomyopathy, chronic systolic heart failure, status post ICD implantation. In the interim, the patient has done well. He denies chest pain, shortness of breath, or syncope. No ICD shocks. He admits to being sedentary. Allergies  Allergen Reactions  . Metoprolol Shortness Of Breath and Nausea And Vomiting  . Peach Flavor Hives and Itching    "peaches; peach flavoring; actually happened after he ate a store-bought peach pie"  . Morphine And Related Hives and Itching     Current Outpatient Prescriptions  Medication Sig Dispense Refill  . albuterol (PROVENTIL HFA;VENTOLIN HFA) 108 (90 BASE) MCG/ACT inhaler Inhale 1 puff into the lungs every 6 (six) hours as needed for wheezing or shortness of breath. 1 Inhaler 2  . aspirin 81 MG tablet Take 81 mg by mouth daily.    Marland Kitchen atorvastatin (LIPITOR) 80 MG tablet Take 1 tablet (80 mg total) by mouth daily. 90 tablet 0  . carvedilol (COREG) 12.5 MG tablet Take (1 tablet)12.5 mg in the morning and ( 2 tablets) 25 mg at night 90 tablet 6  . dextromethorphan (DELSYM) 30 MG/5ML liquid Take 30 mg by mouth as needed for cough.     . docusate sodium (COLACE) 100 MG capsule Take 100 mg by mouth daily.    Marland Kitchen EPIPEN 2-PAK 0.3 MG/0.3ML SOAJ injection Inject 0.3 mg as directed once.     . escitalopram (LEXAPRO) 20 MG tablet Take 20 mg by mouth daily.    . furosemide (LASIX) 20 MG tablet TAKE 1/2 A TABLET BY MOUTH EVERY DAY 45 tablet 3  . HYDROcodone-acetaminophen (NORCO) 10-325 MG per tablet Take 1 tablet by mouth every 8 (eight) hours as needed. Pain (Patient taking differently: Take 1 tablet by mouth every 8 (eight) hours as needed for moderate pain. Pain) 21 tablet 0  . lisinopril (PRINIVIL,ZESTRIL) 5 MG tablet Take 1 tablet (5 mg total) by mouth daily. 30 tablet 11  . meloxicam (MOBIC) 15 MG tablet Take 15 mg by mouth daily.    . peg 3350 powder (MOVIPREP) 100 G  SOLR Take 1 kit (200 g total) by mouth as directed. 1 kit 0  . polyethylene glycol-electrolytes (TRILYTE) 420 G solution Take 4,000 mLs by mouth as directed. 4000 mL 0  . spironolactone (ALDACTONE) 25 MG tablet Take 0.5 tablets (12.5 mg total) by mouth daily. 90 tablet 3   No current facility-administered medications for this visit.     Past Medical History  Diagnosis Date  . Cerebrovascular disease     h/o CVA in 07/2011 + left carotid endarterectomy  . Hyperlipemia   . Pneumonia   . COPD (chronic obstructive pulmonary disease) ([place])     on 2L O2 at home since 08/2011  . 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 ICD (implantable cardiac defibrillator) in place   . Coronary artery disease   . CHF (congestive heart failure) ([place])   . Myocardial infarction ([place]) 08/2011  . Stroke [facility]) 07/2011    denies residual (06/14/2012)  . Chronic lower back pain   . Arthritis     "back; knees" (06/14/2012)  . DDD (degenerative disc disease), lumbosacral   . Depression 09/2011    "after OHS" (06/14/2012)  . Cholelithiasis 07/05/2012    Asymptomatic; identified on CT scanning of the chest;  . Other primary cardiomyopathies     ROS:   All systems reviewed  and negative except as noted in the HPI.   Past Surgical History  Procedure Laterality Date  . Back surgery    . Knee arthroscopy  2002    "right" (06/14/2012)  . Foreign body removal  06/05/2011    Procedure: FOREIGN BODY REMOVAL ADULT;  Surgeon: Hermelinda Dellen;  Location: [place];  Service: Plastics;  Laterality: Right;  removal foreign body from right side face   . Carotid endarterectomy  07/2011    "left" (06/14/2012)  . Tee without cardioversion  10/17/2011    Procedure: TRANSESOPHAGEAL ECHOCARDIOGRAM (TEE);  Surgeon: Jolaine Artist, MD;  Location: [facility] ENDOSCOPY;  Service: Cardiovascular;  Laterality: N/A;  . Coronary artery bypass graft  10/20/2011     Procedure: CORONARY ARTERY BYPASS GRAFTING (CABG);  Surgeon: [name], MD;  Location: [place];  Service: Open Heart Surgery;  Laterality: N/A;  CABG x six;  using left internal mammary artery and right leg greater saphenous vein harvested endoscopically  . Mitral valve repair  10/20/2011    Procedure: MITRAL VALVE REPAIR (MVR);  Surgeon: [name], MD;  Location: [place];  Service: Open Heart Surgery;  Laterality: N/A;  . Cardiac defibrillator placement  06/14/2012  . Posterior lumbar fusion  1996  . Lumbar disc surgery  2012  . Cardiac catheterization  09/2011  . Left and right heart catheterization with coronary angiogram N/A 10/16/2011    Procedure: LEFT AND RIGHT HEART CATHETERIZATION WITH CORONARY ANGIOGRAM;  Surgeon: Jolaine Artist, MD;  Location: [facility] CATH LAB;  Service: Cardiovascular;  Laterality: N/A;  . Implantable cardioverter defibrillator implant N/A 06/14/2012    Procedure: IMPLANTABLE CARDIOVERTER DEFIBRILLATOR IMPLANT;  Surgeon: Evans Lance, MD;  Location: [facility] CATH LAB;  Service: Cardiovascular;  Laterality: N/A;  . Colonoscopy N/A 10/16/2014    Procedure: COLONOSCOPY;  Surgeon: Daneil Dolin, MD;  [facility];  Service: Endoscopy;  Laterality: N/A;  8:00 AM     Family History  Problem Relation Age of Onset  . Coronary artery disease Mother   . Coronary artery disease Father   . Arrhythmia Brother      Social History   Social History  . Marital Status: Married    Spouse Name: N/A  . Number of Children: N/A  . Years of Education: N/A   Occupational History  . Not on file.   Social History Main Topics  . Smoking status: Current Every Day Smoker -- 1.50 packs/day for 42 years    Types: Cigarettes    Start date: 12/12/1969  . Smokeless tobacco: Never Used     Comment: quit from 09-10-2011 and started back smoking 03-26-2012  . 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 Drug Use: No  .  Sexual Activity: No     Comment: Quit drinking alcohol 2 yrs ago   Other Topics Concern  . 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     BP 110/62 mmHg  Pulse 63  Ht '5\' 11"'  (1.803 m)  Wt 233 lb (105.688 kg)  BMI 32.51 kg/m2  SpO2 97%  Physical Exam:  Well appearing 59 year old man,NAD HEENT: Unremarkable Neck:  6 cm JVD, no thyromegally Lungs:  Clear with no wheezes, rales, or rhonchi. HEART:  Regular rate rhythm, grade 2/6 systolic murmur, no rubs, no clicks Abd:  soft, positive  bowel sounds, no organomegally, no rebound, no guarding Ext:  2 plus pulses, no edema, no cyanosis, no clubbing Skin:  No rashes no nodules Neuro:  CN II through XII intact, motor grossly intact  DEVICE  Normal device function.  See PaceArt for details.   Assess/Plan:

## 2015-06-08 NOTE — Assessment & Plan Note (Signed)
His medtronic device is working normally. Will recheck in several months.

## 2015-06-08 NOTE — Patient Instructions (Signed)
Your physician wants you to follow-up in: 1 Year with Dr. Lovena Le.  You will receive a reminder letter in the mail two months in advance. If you don't receive a letter, please call our office to schedule the follow-up appointment.  Remote monitoring is used to monitor your Pacemaker of ICD from home. This monitoring reduces the number of office visits required to check your device to one time per year. It allows [place] to keep an eye on the functioning of your device to ensure it is working properly. You are scheduled for a device check from home on 09/07/15. You may send your transmission at any time that day. If you have a wireless device, the transmission will be sent automatically. After your physician reviews your transmission, you will receive a postcard with your next transmission date.  Your physician recommends that you continue on your current medications as directed. Please refer to the Current Medication list given to you today.  If you need a refill on your cardiac medications before your next appointment, please call your pharmacy.  Thank you for choosing [facility]!

## 2015-06-08 NOTE — Assessment & Plan Note (Signed)
His symptoms are class 2. He admits to being sedentary but denies being limited by dyspnea. He is encouraged to maintain a low sodium diet.

## 2015-06-29 ENCOUNTER — Other Ambulatory Visit: Payer: Self-pay

## 2015-06-29 MED ORDER — CARVEDILOL 12.5 MG PO TABS: ORAL_TABLET | ORAL | Status: DC

## 2015-07-06 ENCOUNTER — Encounter: Payer: Medicare Other | Admitting: Cardiology

## 2015-07-06 NOTE — Progress Notes (Signed)
Patient ID: Charles Savage, male   DOB: 27-Jun-1955, 60 y.o.   MRN: [id number]   Encounter opened in error

## 2015-07-08 ENCOUNTER — Other Ambulatory Visit: Payer: Self-pay

## 2015-07-08 MED ORDER — LISINOPRIL 5 MG PO TABS: 5.0000 mg | ORAL_TABLET | Freq: Every day | ORAL | Status: DC

## 2015-07-08 NOTE — Telephone Encounter (Signed)
Refill complete 

## 2015-07-12 ENCOUNTER — Other Ambulatory Visit: Payer: Self-pay | Admitting: *Deleted

## 2015-07-12 MED ORDER — LISINOPRIL 5 MG PO TABS: 5.0000 mg | ORAL_TABLET | Freq: Every day | ORAL | Status: DC

## 2015-08-04 ENCOUNTER — Ambulatory Visit (INDEPENDENT_AMBULATORY_CARE_PROVIDER_SITE_OTHER): Payer: Medicare Other | Admitting: Cardiology

## 2015-08-04 ENCOUNTER — Encounter: Payer: Self-pay | Admitting: Cardiology

## 2015-08-04 VITALS — BP 126/62 | HR 73 | Ht 71.0 in | Wt 224.0 lb

## 2015-08-04 DIAGNOSIS — G4730 Sleep apnea, unspecified: Secondary | ICD-10-CM

## 2015-08-04 DIAGNOSIS — R0989 Other specified symptoms and signs involving the circulatory and respiratory systems: Secondary | ICD-10-CM | POA: Diagnosis not present

## 2015-08-04 DIAGNOSIS — I5022 Chronic systolic (congestive) heart failure: Secondary | ICD-10-CM | POA: Diagnosis not present

## 2015-08-04 DIAGNOSIS — R0602 Shortness of breath: Secondary | ICD-10-CM | POA: Diagnosis not present

## 2015-08-04 DIAGNOSIS — E785 Hyperlipidemia, unspecified: Secondary | ICD-10-CM

## 2015-08-04 DIAGNOSIS — I059 Rheumatic mitral valve disease, unspecified: Secondary | ICD-10-CM

## 2015-08-04 MED ORDER — CARVEDILOL 25 MG PO TABS: 25.0000 mg | ORAL_TABLET | Freq: Two times a day (BID) | ORAL | Status: DC

## 2015-08-04 NOTE — Progress Notes (Signed)
Patient ID: Charles Savage, male   DOB: September 08, 1955, 60 y.o.   MRN: [id number]     Clinical Summary Charles Savage is a 60 y.o.male seen today for follow up of the following medical problems.   1. CAD/ICM  - Echo 11/2011 LVEF 15-20%,  - CABG 09/2011 x 6 vessels (LIMA-LAD with sequential SVG to OM1, OM2, OM3. SVG-PDA o LCX, SVG to RCA.  - he has an ICD, Medtronic dual chamber ,that is followed by EP Dr. Lovena Le. Normal function on last check 05/2015.   - last visit increased coreg to 12.104m in AM and 288min PM - stable DOE at 1/2 block. No LE edema, occasional orthopnea. Not checking home weights, clinic weights overall stable - compliant with meds.  2. Mitral valve regurgitation - repair with 28 mm annuloplasty ring 09/2011 - denies any LE edema, no SOB or DOE  3. Hyperlipidemia - compliant with statin - 02/2015 TC 136 TG 283 HDL 27 LDL 52   4. OSA screen - no daytime somnolence, + snoring, +apneic episodes  5. Tobacco abuse - still smoking - has e-cig but has not tried yet  Past Medical History  Diagnosis Date  . Cerebrovascular disease     h/o CVA in 07/2011 + left carotid endarterectomy  . Hyperlipemia   . Pneumonia   . COPD (ch[place] obstructive pulmonary disease) ([place]    on 2L O2 at home since 08/2011  . 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 ICD (implantable cardiac defibrillator) in place   . Coronary artery disease   . CHF (congestive heart failure) ([facility]  . [facility]  [facility]    denies residual (06/14/2012)  . Chronic lower back pain   . Arthritis     "back; knees" (06/14/2012)  . DDD (degenerative disc disease), lumbosacral   . Depression 09/2011    "after OHS" (06/14/2012)  . Cholelithiasis 07/05/2012    Asymptomatic; identified on CT scanning of the chest;  . Other primary cardiomyopathies      Allergies  Allergen Reactions  . Metoprolol Shortness Of  Breath and Nausea And Vomiting  . Peach Flavor Hives and Itching    "peaches; peach flavoring; actually happened after he ate a store-bought peach pie"  . Morphine And Related Hives and Itching     Current Outpatient Prescriptions  Medication Sig Dispense Refill  . albuterol (PROVENTIL HFA;VENTOLIN HFA) 108 (90 BASE) MCG/ACT inhaler Inhale 1 puff into the lungs every 6 (six) hours as needed for wheezing or shortness of breath. 1 Inhaler 2  . aspirin 81 MG tablet Take 81 mg by mouth daily.    . Marland Kitchentorvastatin (LIPITOR) 80 MG tablet Take 1 tablet (80 mg total) by mouth daily. 90 tablet 0  . carvedilol (COREG) 12.5 MG tablet Take (1 tablet)12.5 mg in the morning and ( 2 tablets) 25 mg at night 90 tablet 6  . dextromethorphan (DELSYM) 30 MG/5ML liquid Take 30 mg by mouth as needed for cough.     . docusate sodium (COLACE) 100 MG capsule Take 100 mg by mouth daily.    . Marland KitchenPIPEN 2-PAK 0.3 MG/0.3ML SOAJ injection Inject 0.3 mg as directed once.     . escitalopram (LEXAPRO) 20 MG tablet Take 20 mg by mouth daily.    . furosemide (LASIX) 20 MG tablet TAKE 1/2 A TABLET BY MOUTH EVERY DAY 45 tablet 3  . HYDROcodone-acetaminophen (NORCO) 10-325 MG  per tablet Take 1 tablet by mouth every 8 (eight) hours as needed. Pain (Patient taking differently: Take 1 tablet by mouth every 8 (eight) hours as needed for moderate pain. Pain) 21 tablet 0  . lisinopril (PRINIVIL,ZESTRIL) 5 MG tablet Take 1 tablet (5 mg total) by mouth daily. 30 tablet 11  . meloxicam (MOBIC) 15 MG tablet Take 15 mg by mouth daily.    . peg 3350 powder (MOVIPREP) 100 G SOLR Take 1 kit (200 g total) by mouth as directed. 1 kit 0  . polyethylene glycol-electrolytes (TRILYTE) 420 G solution Take 4,000 mLs by mouth as directed. 4000 mL 0  . spironolactone (ALDACTONE) 25 MG tablet Take 0.5 tablets (12.5 mg total) by mouth daily. 90 tablet 3   No current facility-administered medications for this visit.     Past Surgical History  Procedure  Laterality Date  . Back surgery    . Knee arthroscopy  2002    "right" (06/14/2012)  . Foreign body removal  06/05/2011    Procedure: FOREIGN BODY REMOVAL ADULT;  Surgeon: Hermelinda Dellen;  Location: [place];  Service: Plastics;  Laterality: Right;  removal foreign body from right side face   . Carotid endarterectomy  07/2011    "left" (06/14/2012)  . Tee without cardioversion  10/17/2011    Procedure: TRANSESOPHAGEAL ECHOCARDIOGRAM (TEE);  Surgeon: Jolaine Artist, MD;  [facility] ENDOSCOPY;  Service: Cardiovascular;  Laterality: N/A;  . Coronary artery bypass graft  10/20/2011    Procedure: CORONARY ARTERY BYPASS GRAFTING (CABG);  Surgeon: [name], MD;  Location: [place];  Service: Open Heart Surgery;  Laterality: N/A;  CABG x six;  using left internal mammary artery and right leg greater saphenous vein harvested endoscopically  . Mitral valve repair  10/20/2011    Procedure: MITRAL VALVE REPAIR (MVR);  Surgeon: [name], MD;  Location: [place];  Service: Open Heart Surgery;  Laterality: N/A;  . Cardiac defibrillator placement  06/14/2012  . Posterior lumbar fusion  1996  . Lumbar disc surgery  2012  . Cardiac catheterization  09/2011  . Left and right heart catheterization with coronary angiogram N/A 10/16/2011    Procedure: LEFT AND RIGHT HEART CATHETERIZATION WITH CORONARY ANGIOGRAM;  Surgeon: Jolaine Artist, MD;  Location: [facility] CATH LAB;  Service: Cardiovascular;  Laterality: N/A;  . Implantable cardioverter defibrillator implant N/A 06/14/2012    Procedure: IMPLANTABLE CARDIOVERTER DEFIBRILLATOR IMPLANT;  Surgeon: Evans Lance, MD;  Location: [facility] CATH LAB;  Service: Cardiovascular;  Laterality: N/A;  . Colonoscopy N/A 10/16/2014    Procedure: COLONOSCOPY;  Surgeon: Daneil Dolin, MD;  [facility];  Service: Endoscopy;  Laterality: N/A;  8:00 AM     Allergies  Allergen Reactions  . Metoprolol Shortness Of Breath and Nausea And Vomiting    . Peach Flavor Hives and Itching    "peaches; peach flavoring; actually happened after he ate a store-bought peach pie"  . Morphine And Related Hives and Itching      Family History  Problem Relation Age of Onset  . Coronary artery disease Mother   . Coronary artery disease Father   . Arrhythmia Brother      Social History Charles Savage reports that he has been smoking Cigarettes.  He started smoking about 45 years ago. He has a 63 pack-year smoking history. He has never used smokeless tobacco. Charles Savage reports that he does not drink alcohol.   Review of Systems CONSTITUTIONAL: No weight  loss, fever, chills, weakness or fatigue.  HEENT: Eyes: No visual loss, blurred vision, double vision or yellow sclerae.No hearing loss, sneezing, congestion, runny nose or sore throat.  SKIN: No rash or itching.  CARDIOVASCULAR: per hpi RESPIRATORY: +SOB  GASTROINTESTINAL: No anorexia, nausea, vomiting or diarrhea. No abdominal pain or blood.  GENITOURINARY: No burning on urination, no polyuria NEUROLOGICAL: No headache, dizziness, syncope, paralysis, ataxia, numbness or tingling in the extremities. No change in bowel or bladder control.  MUSCULOSKELETAL: No muscle, back pain, joint pain or stiffness.  LYMPHATICS: No enlarged nodes. No history of splenectomy.  PSYCHIATRIC: No history of depression or anxiety.  ENDOCRINOLOGIC: No reports of sweating, cold or heat intolerance. No polyuria or polydipsia.  Marland Kitchen   Physical Examination Filed Vitals:   08/04/15 1022  BP: 126/62  Pulse: 73   Filed Vitals:   08/04/15 1022  Height: '5\' 11"'  (1.803 m)  Weight: 224 lb (101.606 kg)    Gen: resting comfortably, no acute distress HEENT: no scleral icterus, pupils equal round and reactive, no palptable cervical adenopathy,  CV: RRR, no m/r/g, no jvd. Bilateral carotid bruits Resp: Clear to auscultation bilaterally GI: abdomen is soft, non-tender, non-distended, normal bowel sounds, no  hepatosplenomegaly MSK: extremities are warm, no edema.  Skin: warm, no rash Neuro:  no focal deficits Psych: appropriate affect   Diagnostic Studies 11/2011 Echo  LVEF 15-20%, anular MV ring with trivial MR, severe RV dysfunction,  09/2011 TEE  LEFT VENTRICLE: EF = 25% Mildly dilated. Global HK.   RIGHT VENTRICLE: Moderately HK  LEFT ATRIUM: Moderately dilated  LEFT ATRIAL APPENDAGE: No thrombus  RIGHT ATRIUM: Normal  AORTIC VALVE: Trileaflet. Trivial AI. No AS.  MITRAL VALVE: Structurally normal. Mild to moderate MR.  TRICUSPID VALVE: Normal Trivial TR  PULMONIC VALVE: Normal  INTERATRIAL SEPTUM: No ASD or PFO.  PERICARDIUM: Moderate effusion along RA//RV. No tamponade.  DESCENDING AORTA: Severe plaque.  09/2011 Cath  Findings:  On milrinone 0.25 mg.kg/min  RA = 5  RV = 30/5/7  PA = 30/16 (23)  PCW = 20 (no significant v-waves)  Fick cardiac output/index = 5.46/2.8  PVR = 0.5 Woods  SVR = 981  FA sat = 93%  PA sat = 67%, 71% (on milrinone)  Ao Pressure: 92/63 (76)  LV Pressure: 98/8/18  There was no signficant gradient across the aortic valve on pullback.  Left main: 80-90% distal  LAD: Flush occlusion at ostium. Mild filling in mid and distal segments through L to L and R to L collaterals  LCX: Large. Dominant. 70-80% mid AV groove. OM-1 small to moderate vessel with mild ostial disease. OM-2 very large 99% lesion in mid section at trifurcation. 90% lesion in lowest branch. PDA 50-60 ostial  RCA: Small to moderate-sized, non- dominant vessel ending with acute marginal branch. Diffuse 60% prox to mid. 95% midsection. R to L collats to LAD from acute marginal.  LV-gram done in the RAO projection: Ejection fraction = 25-30% with global HK 3+ MR  L subclavian: Widely patent to chest wall with mild plaquing  Assessment:  1. 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 Ischemic CM with EF 25-30%  3. 3+ mitral regurgitation   4. Well compensated hemodynamics on milrinone  Plan/Discussion:  Will need CABG/MVR. Check PFTs and TEE. Continue milrinone. Transfer stepdown. Consult TCTS.     Assessment and Plan  1. CAD/ICM/Chronic systolic HF - LVEF 63-89% by echo 11/2011, NYHA III, he has a Medtronic ICD - titration of  medications has been limited by low blood pressures and orthostatic symptoms - will try to increase coreg to 55m bid mg   2. Mitral valve regurgitation - s/p repair, denies any current symptoms - continue to follow   3. Hyperlipidemia - continue high dose statin in setting of CAD  4. OSA screen - refer to Dr HLuan Pullingfor evaluation again as he did not make appt since last visit.   5. Carotid bruit - obtain carotid [place] 6. SOB - history of tobacco use, will order PFTs  F/u 2 months  JArnoldo Lenis M.D.

## 2015-08-04 NOTE — Patient Instructions (Signed)
Medication Instructions:  INCREASE COREG to 25 MG TWO TIMES DAILY  Labwork: NONE  Testing/Procedures: Your physician has requested that you have a carotid duplex. This test is an ultrasound of the carotid arteries in your neck. It looks at blood flow through these arteries that supply the brain with blood. Allow one hour for this exam. There are no restrictions or special instructions.  Your physician has recommended that you have a pulmonary function test. Pulmonary Function Tests are a group of tests that measure how well air moves in and out of your lungs.    Follow-Up: Your physician recommends that you schedule a follow-up appointment in: 2 MONTHS WITH DR. BRANCH   Any Other Special Instructions Will Be Listed Below (If Applicable). WE REFERRED YOU TO DR. HAWKINS FOR A SLEEP APNEA TEST    If you need a refill on your cardiac medications before your next appointment, please call your pharmacy.

## 2015-08-11 ENCOUNTER — Other Ambulatory Visit: Payer: Self-pay

## 2015-08-11 MED ORDER — ATORVASTATIN CALCIUM 80 MG PO TABS: 80.0000 mg | ORAL_TABLET | Freq: Every day | ORAL | Status: DC

## 2015-08-11 NOTE — Telephone Encounter (Signed)
Refill complete 

## 2015-08-12 ENCOUNTER — Other Ambulatory Visit: Payer: Self-pay

## 2015-08-12 MED ORDER — ATORVASTATIN CALCIUM 80 MG PO TABS: 80.0000 mg | ORAL_TABLET | Freq: Every day | ORAL | Status: DC

## 2015-08-13 ENCOUNTER — Other Ambulatory Visit: Payer: Self-pay

## 2015-08-13 MED ORDER — ATORVASTATIN CALCIUM 80 MG PO TABS: 80.0000 mg | ORAL_TABLET | Freq: Every day | ORAL | Status: DC

## 2015-08-13 NOTE — Telephone Encounter (Signed)
Re routed [facility]vastatin [facility]

## 2015-08-18 ENCOUNTER — Ambulatory Visit (HOSPITAL_COMMUNITY): Admission: RE | Admit: 2015-08-18 | Payer: Medicare Other | Source: Ambulatory Visit | Admitting: Cardiology

## 2015-08-18 ENCOUNTER — Encounter (HOSPITAL_COMMUNITY): Payer: Medicare Other

## 2015-08-25 ENCOUNTER — Other Ambulatory Visit: Payer: Self-pay | Admitting: Cardiology

## 2015-08-30 ENCOUNTER — Ambulatory Visit (HOSPITAL_COMMUNITY)
Admission: RE | Admit: 2015-08-30 | Discharge: 2015-08-30 | Disposition: A | Discharge status: Home / Self Care | Payer: Medicare Other | Source: Ambulatory Visit | Attending: Cardiology | Admitting: Cardiology

## 2015-08-30 DIAGNOSIS — I6523 Occlusion and stenosis of bilateral carotid arteries: Secondary | ICD-10-CM | POA: Insufficient documentation

## 2015-08-30 DIAGNOSIS — R0989 Other specified symptoms and signs involving the circulatory and respiratory systems: Secondary | ICD-10-CM | POA: Diagnosis present

## 2015-09-01 ENCOUNTER — Ambulatory Visit (HOSPITAL_COMMUNITY)
Admission: RE | Admit: 2015-09-01 | Discharge: 2015-09-01 | Disposition: A | Discharge status: Home / Self Care | Payer: Medicare Other | Source: Ambulatory Visit | Attending: Cardiology | Admitting: Cardiology

## 2015-09-01 DIAGNOSIS — R0602 Shortness of breath: Secondary | ICD-10-CM | POA: Diagnosis not present

## 2015-09-01 LAB — PULMONARY FUNCTION TEST
DL/VA % PRED: 52 %
DL/VA: 2.37 ml/min/mmHg/L
DLCO unc % pred: 35 %
DLCO unc: 10.53 ml/min/mmHg
FEF 25-75 POST: 1.36 L/s
FEF 25-75 Pre: 0.79 L/sec
FEF2575-%CHANGE-POST: 72 %
FEF2575-%PRED-POST: 49 %
FEF2575-%PRED-PRE: 28 %
FEV1-%CHANGE-POST: 16 %
FEV1-%Pred-Post: 69 %
FEV1-%Pred-Pre: 59 %
FEV1-PRE: 2 L
FEV1-Post: 2.32 L
FEV1FVC-%Change-Post: 11 %
FEV1FVC-%PRED-PRE: 75 %
FEV6-%Change-Post: 9 %
FEV6-%PRED-POST: 84 %
FEV6-%PRED-PRE: 77 %
FEV6-POST: 3.56 L
FEV6-Pre: 3.26 L
FEV6FVC-%Change-Post: 5 %
FEV6FVC-%Pred-Post: 103 %
FEV6FVC-%Pred-Pre: 98 %
FVC-%CHANGE-POST: 3 %
FVC-%PRED-PRE: 78 %
FVC-%Pred-Post: 81 %
FVC-POST: 3.61 L
FVC-PRE: 3.48 L
POST FEV1/FVC RATIO: 64 %
PRE FEV1/FVC RATIO: 57 %
Post FEV6/FVC ratio: 99 %
Pre FEV6/FVC Ratio: 94 %
RV % pred: 100 %
RV: 2.16 L
TLC % pred: 83 %
TLC: 5.51 L

## 2015-09-01 MED ORDER — ALBUTEROL SULFATE (2.5 MG/3ML) 0.083% IN NEBU
2.5000 mg | INHALATION_SOLUTION | Freq: Once | RESPIRATORY_TRACT | Status: AC
  Administered 2015-09-01: 2.5 mg via RESPIRATORY_TRACT

## 2015-09-07 ENCOUNTER — Ambulatory Visit (INDEPENDENT_AMBULATORY_CARE_PROVIDER_SITE_OTHER): Payer: Medicare Other | Admitting: *Deleted

## 2015-09-07 ENCOUNTER — Other Ambulatory Visit: Payer: Self-pay

## 2015-09-07 DIAGNOSIS — Z95810 Presence of automatic (implantable) cardiac defibrillator: Secondary | ICD-10-CM | POA: Diagnosis not present

## 2015-09-07 DIAGNOSIS — I255 Ischemic cardiomyopathy: Secondary | ICD-10-CM

## 2015-09-07 MED ORDER — TIOTROPIUM BROMIDE MONOHYDRATE 18 MCG IN CAPS: 18.0000 ug | ORAL_CAPSULE | Freq: Every day | RESPIRATORY_TRACT | Status: DC

## 2015-09-07 NOTE — Progress Notes (Signed)
Remote ICD transmission.   

## 2015-09-10 ENCOUNTER — Other Ambulatory Visit (HOSPITAL_COMMUNITY): Payer: Self-pay | Admitting: Respiratory Therapy

## 2015-09-10 DIAGNOSIS — G4730 Sleep apnea, unspecified: Secondary | ICD-10-CM

## 2015-09-17 LAB — CUP PACEART REMOTE DEVICE CHECK
Battery Remaining Longevity: 115 mo
Brady Statistic RV Percent Paced: 0.01 %
Date Time Interrogation Session: 20170314041706
HIGH POWER IMPEDANCE MEASURED VALUE: 77 Ohm
Lead Channel Impedance Value: 361 Ohm
Lead Channel Impedance Value: 456 Ohm
Lead Channel Pacing Threshold Amplitude: 0.75 V
Lead Channel Pacing Threshold Pulse Width: 0.4 ms
Lead Channel Sensing Intrinsic Amplitude: 10.625 mV
Lead Channel Setting Pacing Amplitude: 2 V
Lead Channel Setting Sensing Sensitivity: 0.3 mV
MDC IDC LEAD IMPLANT DT: 20131220
MDC IDC LEAD LOCATION: 753860
MDC IDC LEAD MODEL: 6935
MDC IDC MSMT BATTERY VOLTAGE: 3.02 V
MDC IDC MSMT LEADCHNL RV SENSING INTR AMPL: 10.625 mV
MDC IDC SET LEADCHNL RV PACING PULSEWIDTH: 0.4 ms

## 2015-09-20 ENCOUNTER — Encounter: Payer: Self-pay | Admitting: Cardiology

## 2015-10-01 ENCOUNTER — Ambulatory Visit (INDEPENDENT_AMBULATORY_CARE_PROVIDER_SITE_OTHER): Payer: Medicare Other | Admitting: Cardiology

## 2015-10-01 ENCOUNTER — Encounter: Payer: Self-pay | Admitting: Cardiology

## 2015-10-01 VITALS — BP 102/62 | HR 71 | Ht 69.0 in | Wt 221.0 lb

## 2015-10-01 DIAGNOSIS — I5022 Chronic systolic (congestive) heart failure: Secondary | ICD-10-CM

## 2015-10-01 DIAGNOSIS — I6523 Occlusion and stenosis of bilateral carotid arteries: Secondary | ICD-10-CM

## 2015-10-01 DIAGNOSIS — I2510 Atherosclerotic heart disease of native coronary artery without angina pectoris: Secondary | ICD-10-CM | POA: Diagnosis not present

## 2015-10-01 DIAGNOSIS — J449 Chronic obstructive pulmonary disease, unspecified: Secondary | ICD-10-CM | POA: Diagnosis not present

## 2015-10-01 DIAGNOSIS — I059 Rheumatic mitral valve disease, unspecified: Secondary | ICD-10-CM

## 2015-10-01 MED ORDER — CARVEDILOL 12.5 MG PO TABS: ORAL_TABLET | ORAL | Status: DC

## 2015-10-01 NOTE — Patient Instructions (Addendum)
Your physician wants you to follow-up in: 6 months Dr Bryna Colander will receive a reminder letter in the mail two months in advance. If you don't receive a letter, please call our office to schedule the follow-up appointment.   DECREASE Coreg to 12.5 mg in the morning, and take 25 mg (2 tablets) in the pm    Your physician has requested that you have an echocardiogram. Echocardiography is a painless test that uses sound waves to create images of your heart. It provides your doctor with information about the size and shape of your heart and how well your heart's chambers and valves are working. This procedure takes approximately one hour. There are no restrictions for this procedure.     Thank you for choosing [facility] !

## 2015-10-01 NOTE — Progress Notes (Signed)
Patient ID: GENNARO LIZOTTE, male   DOB: Aug 03, 1955, 60 y.o.   MRN: [id number]     Clinical Summary Mr. Sissel is a 60 y.o.male seen today for follow up of the following medical problems.    1. CAD/ICM  - Echo 11/2011 LVEF 15-20%,  - CABG 09/2011 x 6 vessels (LIMA-LAD with sequential SVG to OM1, OM2, OM3. SVG-PDA o LCX, SVG to RCA.  - he has an ICD, Medtronic dual chamber ,that is followed by EP Dr. Lovena Le. Normal function on last check 08/2015   - last visit we increased coreg to '25mg'$  bid. Since that time increased lightheadness, fatigue.  - no SOB or DOE. No LE edema, no orthopnea - compliant with meds.   2. Mitral valve regurgitation - repair with 28 mm annuloplasty ring 09/2011 - denies any LE edema, no SOB or DOE  3. Hyperlipidemia - compliant with statin - 02/2015 TC 136 TG 283 HDL 27 LDL 52   4. OSA screen - has sleep coming later this month   5. Tobacco abuse - still smoking - has e-cig but has not tried yet - chantix caused hallucinations.   6. Carotid stenosis - 08/2015 bilateral moderate carotid stenosis.  - no recent neuro symptoms   Past Medical History  Diagnosis Date  . Cerebrovascular disease     h/o CVA in 07/2011 + left carotid endarterectomy  . Hyperlipemia   . Pneumonia   . COPD (chronic obstructive pulmonary disease) ([place])     on 2L O2 at home since 08/2011  . 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 ICD (implantable cardiac defibrillator) in place   . Coronary artery disease   . CHF (congestive heart failure) ([employer])   . Myocardial infarction ([place]) 08/2011  . Stroke [facility]) 07/2011    denies residual (06/14/2012)  . Chronic lower back pain   . Arthritis     "back; knees" (06/14/2012)  . DDD (degenerative disc disease), lumbosacral   . Depression 09/2011    "after OHS" (06/14/2012)  . Cholelithiasis 07/05/2012    Asymptomatic; identified on CT scanning of the chest;  . Other primary  cardiomyopathies      Allergies  Allergen Reactions  . Metoprolol Shortness Of Breath and Nausea And Vomiting  . Peach Flavor Hives and Itching    "peaches; peach flavoring; actually happened after he ate a store-bought peach pie"  . Morphine And Related Hives and Itching     Current Outpatient Prescriptions  Medication Sig Dispense Refill  . albuterol (PROVENTIL HFA;VENTOLIN HFA) 108 (90 BASE) MCG/ACT inhaler Inhale 1 puff into the lungs every 6 (six) hours as needed for wheezing or shortness of breath. 1 Inhaler 2  . aspirin 81 MG tablet Take 81 mg by mouth daily.    Marland Kitchen atorvastatin (LIPITOR) 80 MG tablet Take 1 tablet (80 mg total) by mouth daily. 90 tablet 2  . carvedilol (COREG) 25 MG tablet Take 1 tablet (25 mg total) by mouth 2 (two) times daily. 180 tablet 3  . dextromethorphan (DELSYM) 30 MG/5ML liquid Take 30 mg by mouth as needed for cough.     . EPIPEN 2-PAK 0.3 MG/0.3ML SOAJ injection Inject 0.3 mg as directed once.     . escitalopram (LEXAPRO) 20 MG tablet Take 20 mg by mouth daily.    . furosemide (LASIX) 20 MG tablet TAKE HALF A TABLET BY MOUTH EVERY DAY 45 tablet 3  . HYDROcodone-acetaminophen (NORCO) 10-325 MG per  tablet Take 1 tablet by mouth every 8 (eight) hours as needed. Pain (Patient taking differently: Take 1 tablet by mouth every 8 (eight) hours as needed for moderate pain. Pain) 21 tablet 0  . lisinopril (PRINIVIL,ZESTRIL) 5 MG tablet Take 1 tablet (5 mg total) by mouth daily. 30 tablet 11  . meloxicam (MOBIC) 15 MG tablet Take 15 mg by mouth daily.    Marland Kitchen spironolactone (ALDACTONE) 25 MG tablet Take 0.5 tablets (12.5 mg total) by mouth daily. 90 tablet 3  . tiotropium (SPIRIVA HANDIHALER) 18 MCG inhalation capsule Place 1 capsule (18 mcg total) into inhaler and inhale daily. 30 capsule 12   No current facility-administered medications for this visit.     Past Surgical History  Procedure Laterality Date  . Back surgery    . Knee arthroscopy  2002     "right" (06/14/2012)  . Foreign body removal  06/05/2011    Procedure: FOREIGN BODY REMOVAL ADULT;  Surgeon: Hermelinda Dellen;  Location: [place];  Service: Plastics;  Laterality: Right;  removal foreign body from right side face   . Carotid endarterectomy  07/2011    "left" (06/14/2012)  . Tee without cardioversion  10/17/2011    Procedure: TRANSESOPHAGEAL ECHOCARDIOGRAM (TEE);  Surgeon: Jolaine Artist, MD;  Location: [facility] ENDOSCOPY;  Service: Cardiovascular;  Laterality: N/A;  . Coronary artery bypass graft  10/20/2011    Procedure: CORONARY ARTERY BYPASS GRAFTING (CABG);  Surgeon: [name], MD;  Location: [place];  Service: Open Heart Surgery;  Laterality: N/A;  CABG x six;  using left internal mammary artery and right leg greater saphenous vein harvested endoscopically  . Mitral valve repair  10/20/2011    Procedure: MITRAL VALVE REPAIR (MVR);  Surgeon: [name], MD;  Location: [place];  Service: Open Heart Surgery;  Laterality: N/A;  . Cardiac defibrillator placement  06/14/2012  . Posterior lumbar fusion  1996  . Lumbar disc surgery  2012  . Cardiac catheterization  09/2011  . Left and right heart catheterization with coronary angiogram N/A 10/16/2011    Procedure: LEFT AND RIGHT HEART CATHETERIZATION WITH CORONARY ANGIOGRAM;  Surgeon: Jolaine Artist, MD;  [facility] CATH LAB;  Service: Cardiovascular;  Laterality: N/A;  . Implantable cardioverter defibrillator implant N/A 06/14/2012    Procedure: IMPLANTABLE CARDIOVERTER DEFIBRILLATOR IMPLANT;  Surgeon: Evans Lance, MD;  Location: [facility] CATH LAB;  Service: Cardiovascular;  Laterality: N/A;  . Colonoscopy N/A 10/16/2014    Procedure: COLONOSCOPY;  Surgeon: Daneil Dolin, MD;  [facility];  Service: Endoscopy;  Laterality: N/A;  8:00 AM     Allergies  Allergen Reactions  . Metoprolol Shortness Of Breath and Nausea And Vomiting  . Peach Flavor Hives and Itching    "peaches; peach flavoring;  actually happened after he ate a store-bought peach pie"  . Morphine And Related Hives and Itching      Family History  Problem Relation Age of Onset  . Coronary artery disease Mother   . Coronary artery disease Father   . Arrhythmia Brother      Social History Mr. Cabello reports that he has been smoking Cigarettes.  He started smoking about 45 years ago. He has a 63 pack-year smoking history. He has never used smokeless tobacco. Mr. Pann reports that he does not drink alcohol.   Review of Systems CONSTITUTIONAL: No weight loss, fever, chills, weakness or fatigue.  HEENT: Eyes: No visual loss, blurred vision, double vision or yellow sclerae.No  hearing loss, sneezing, congestion, runny nose or sore throat.  SKIN: No rash or itching.  CARDIOVASCULAR: per HPI RESPIRATORY: No shortness of breath, cough or sputum.  GASTROINTESTINAL: No anorexia, nausea, vomiting or diarrhea. No abdominal pain or blood.  GENITOURINARY: No burning on urination, no polyuria NEUROLOGICAL: No headache, dizziness, syncope, paralysis, ataxia, numbness or tingling in the extremities. No change in bowel or bladder control.  MUSCULOSKELETAL: No muscle, back pain, joint pain or stiffness.  LYMPHATICS: No enlarged nodes. No history of splenectomy.  PSYCHIATRIC: No history of depression or anxiety.  ENDOCRINOLOGIC: No reports of sweating, cold or heat intolerance. No polyuria or polydipsia.  Marland Kitchen   Physical Examination Filed Vitals:   10/01/15 0856  BP: 102/62  Pulse: 71   Filed Vitals:   10/01/15 0856  Height: '5\' 9"'$  (1.753 m)  Weight: 221 lb (100.245 kg)    Gen: resting comfortably, no acute distress HEENT: no scleral icterus, pupils equal round and reactive, no palptable cervical adenopathy,  CV: RRR, no m/r/g, nojvd Resp: Clear to auscultation bilaterally GI: abdomen is soft, non-tender, non-distended, normal bowel sounds, no hepatosplenomegaly MSK: extremities are warm, no edema.  Skin: warm,  no rash Neuro:  no focal deficits Psych: appropriate affect   Diagnostic Studies  11/2011 Echo  LVEF 15-20%, anular MV ring with trivial MR, severe RV dysfunction,  09/2011 TEE  LEFT VENTRICLE: EF = 25% Mildly dilated. Global HK.   RIGHT VENTRICLE: Moderately HK  LEFT ATRIUM: Moderately dilated  LEFT ATRIAL APPENDAGE: No thrombus  RIGHT ATRIUM: Normal  AORTIC VALVE: Trileaflet. Trivial AI. No AS.  MITRAL VALVE: Structurally normal. Mild to moderate MR.  TRICUSPID VALVE: Normal Trivial TR  PULMONIC VALVE: Normal  INTERATRIAL SEPTUM: No ASD or PFO.  PERICARDIUM: Moderate effusion along RA//RV. No tamponade.  DESCENDING AORTA: Severe plaque.  09/2011 Cath  Findings:  On milrinone 0.25 mg.kg/min  RA = 5  RV = 30/5/7  PA = 30/16 (23)  PCW = 20 (no significant v-waves)  Fick cardiac output/index = 5.46/2.8  PVR = 0.5 Woods  SVR = 981  FA sat = 93%  PA sat = 67%, 71% (on milrinone)  Ao Pressure: 92/63 (76)  LV Pressure: 98/8/18  There was no signficant gradient across the aortic valve on pullback.  Left main: 80-90% distal  LAD: Flush occlusion at ostium. Mild filling in mid and distal segments through L to L and R to L collaterals  LCX: Large. Dominant. 70-80% mid AV groove. OM-1 small to moderate vessel with mild ostial disease. OM-2 very large 99% lesion in mid section at trifurcation. 90% lesion in lowest Yeriel Mineo. PDA 50-60 ostial  RCA: Small to moderate-sized, non- dominant vessel ending with acute marginal Caitriona Sundquist. Diffuse 60% prox to mid. 95% midsection. R to L collats to LAD from acute marginal.  LV-gram done in the RAO projection: Ejection fraction = 25-30% with global HK 3+ MR  L subclavian: Widely patent to chest wall with mild plaquing  Assessment:  1. 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 Ischemic CM with EF 25-30%  3. 3+ mitral regurgitation  4. Well compensated hemodynamics on milrinone  Plan/Discussion:   Will need CABG/MVR. Check PFTs and TEE. Continue milrinone. Transfer stepdown. Consult TCTS.     Assessment and Plan   1. CAD/ICM/Chronic systolic HF - LVEF 99-37% by echo 11/2011, NYHA III, he has a Medtronic ICD - titration of medications has been limited by low blood pressures and orthostatic symptoms - did not tolerate  coreg '25mg'$  bid, ill cut back to 12.'5mg'$  in AM and '25mg'$  at night - several years since last echo, we will repeat study to see if further attempts at medicaiton titration are indicated.   2. Mitral valve regurgitation - s/p repair, denies any current symptoms - continue to monitor  3. Hyperlipidemia - continue high dose statin in setting of CAD  4. OSA screen - has a sleep study coming up within the next few weeks.    5. Carotid stenosis - moderate bilatera disease on recent echo, continue to monitor  6. COPD - PFTs 08/2015 were moderate, we started him on spiriva. Continue current meds.      Arnoldo Lenis, M.D.

## 2015-10-05 ENCOUNTER — Ambulatory Visit: Payer: Medicare Other | Admitting: Cardiology

## 2015-10-07 ENCOUNTER — Ambulatory Visit: Payer: Medicare Other | Admitting: Cardiology

## 2015-10-11 ENCOUNTER — Ambulatory Visit (HOSPITAL_COMMUNITY)
Admission: RE | Admit: 2015-10-11 | Discharge: 2015-10-11 | Disposition: A | Discharge status: Home / Self Care | Payer: Medicare Other | Source: Ambulatory Visit | Attending: Cardiology | Admitting: Cardiology

## 2015-10-11 DIAGNOSIS — J449 Chronic obstructive pulmonary disease, unspecified: Secondary | ICD-10-CM | POA: Diagnosis not present

## 2015-10-11 DIAGNOSIS — E785 Hyperlipidemia, unspecified: Secondary | ICD-10-CM | POA: Insufficient documentation

## 2015-10-11 DIAGNOSIS — I351 Nonrheumatic aortic (valve) insufficiency: Secondary | ICD-10-CM | POA: Insufficient documentation

## 2015-10-11 DIAGNOSIS — I071 Rheumatic tricuspid insufficiency: Secondary | ICD-10-CM | POA: Insufficient documentation

## 2015-10-11 DIAGNOSIS — I5022 Chronic systolic (congestive) heart failure: Secondary | ICD-10-CM | POA: Diagnosis not present

## 2015-10-11 DIAGNOSIS — I517 Cardiomegaly: Secondary | ICD-10-CM | POA: Insufficient documentation

## 2015-10-11 DIAGNOSIS — I5189 Other ill-defined heart diseases: Secondary | ICD-10-CM | POA: Diagnosis not present

## 2015-10-11 DIAGNOSIS — I340 Nonrheumatic mitral (valve) insufficiency: Secondary | ICD-10-CM | POA: Insufficient documentation

## 2015-10-11 DIAGNOSIS — I429 Cardiomyopathy, unspecified: Secondary | ICD-10-CM | POA: Diagnosis present

## 2015-10-13 ENCOUNTER — Telehealth: Payer: Self-pay | Admitting: *Deleted

## 2015-10-13 NOTE — Telephone Encounter (Signed)
Pt wife made aware, routed to pcp

## 2015-10-13 NOTE — Telephone Encounter (Signed)
-----   Message from Arnoldo Lenis, MD sent at 10/12/2015 12:19 PM EDT ----- Echo shows heart function has normalized since last check, overall echo looks good. No med changes at this time   Zandra Abts MD

## 2015-10-14 ENCOUNTER — Other Ambulatory Visit (HOSPITAL_COMMUNITY): Payer: Self-pay | Admitting: Respiratory Therapy

## 2015-10-14 DIAGNOSIS — G4730 Sleep apnea, unspecified: Secondary | ICD-10-CM

## 2015-10-21 ENCOUNTER — Ambulatory Visit: Payer: Medicare Other | Attending: Pulmonary Disease | Admitting: Neurology

## 2015-10-21 DIAGNOSIS — G4730 Sleep apnea, unspecified: Secondary | ICD-10-CM

## 2015-10-21 DIAGNOSIS — G4733 Obstructive sleep apnea (adult) (pediatric): Secondary | ICD-10-CM | POA: Diagnosis not present

## 2015-11-02 NOTE — Procedures (Signed)
[place] A. Merlene Laughter, MD     [email or web address]             NOCTURNAL POLYSOMNOGRAPHY   LOCATION: [facility]    Demographics Edit Patient Name: Charles Savage, Charles Savage Date: 10/21/2015 Gender: Male D.O.B: [birth year] Age (years): 54 Referring Provider: Not Available Height (inches): 68 Interpreting Physician: Phillips Odor MD, ABSM Weight (lbs): 223 RPSGT: Peak, Robert BMI: 34 MRN: [id number] Neck Size: 18.50   CLINICAL INFORMATION EditMoveRemove this section Sleep Study Type: Split Night CPAP Indication for sleep study: N/A Epworth Sleepiness Score: SLEEP STUDY TECHNIQUE EditMoveRemove this section As per the AASM Manual for the Scoring of Sleep and Associated Events v2.3 (April 2016) with a hypopnea requiring 4% desaturations. The channels recorded and monitored were frontal, central and occipital EEG, electrooculogram (EOG), submentalis EMG (chin), nasal and oral airflow, thoracic and abdominal wall motion, anterior tibialis EMG, snore microphone, electrocardiogram, and pulse oximetry. Continuous positive airway pressure (CPAP) was initiated when the patient met split night criteria and was titrated according to treat sleep-disordered breathing. MEDICATIONS   Current outpatient prescriptions:  .  albuterol (PROVENTIL HFA;VENTOLIN HFA) 108 (90 BASE) MCG/ACT inhaler, Inhale 1 puff into the lungs every 6 (six) hours as needed for wheezing or shortness of breath., Disp: 1 Inhaler, Rfl: 2 .  aspirin 81 MG tablet, Take 81 mg by mouth daily., Disp: , Rfl:  .  atorvastatin (LIPITOR) 80 MG tablet, Take 1 tablet (80 mg total) by mouth daily., Disp: 90 tablet, Rfl: 2 .  carvedilol (COREG) 12.5 MG tablet, Take 12.5 mg am and 25 mg ( 2 tablets) in the pm, Disp: 180 tablet, Rfl: 3 .  dextromethorphan (DELSYM) 30 MG/5ML liquid, Take 30 mg by mouth as needed for cough. , Disp: , Rfl:  .  EPIPEN 2-PAK 0.3 MG/0.3ML SOAJ injection, Inject 0.3 mg as directed once. , Disp:  , Rfl:  .  escitalopram (LEXAPRO) 20 MG tablet, Take 20 mg by mouth daily., Disp: , Rfl:  .  furosemide (LASIX) 20 MG tablet, TAKE HALF A TABLET BY MOUTH EVERY DAY, Disp: 45 tablet, Rfl: 3 .  HYDROcodone-acetaminophen (NORCO) 10-325 MG per tablet, Take 1 tablet by mouth every 8 (eight) hours as needed. Pain (Patient taking differently: Take 1 tablet by mouth every 8 (eight) hours as needed for moderate pain. Pain), Disp: 21 tablet, Rfl: 0 .  lisinopril (PRINIVIL,ZESTRIL) 5 MG tablet, Take 1 tablet (5 mg total) by mouth daily., Disp: 30 tablet, Rfl: 11 .  meloxicam (MOBIC) 15 MG tablet, Take 15 mg by mouth daily., Disp: , Rfl:  .  spironolactone (ALDACTONE) 25 MG tablet, Take 0.5 tablets (12.5 mg total) by mouth daily., Disp: 90 tablet, Rfl: 3 .  tiotropium (SPIRIVA HANDIHALER) 18 MCG inhalation capsule, Place 1 capsule (18 mcg total) into inhaler and inhale daily., Disp: 30 capsule, Rfl: 12  Medications taken by the patient : N/A Medications administered by patient during sleep study : No sleep medicine administered. RESPIRATORY PARAMETERS EditMoveRemove this section Diagnostic Total AHI (/hr): 68.7 RDI (/hr): 72.1 OA Index (/hr): 19.8 CA Index (/hr): 1.9 REM AHI (/hr): 60.0 NREM AHI (/hr): 68.9 Supine AHI (/hr): N/A Non-supine AHI (/hr): 68.71 Min O2 Sat (%): 78.00 Mean O2 (%): 89.24 Time below 88% (min): 47.4   Titration Optimal Pressure (cm): 12 AHI at Optimal Pressure (/hr): 3.0 Min O2 at Optimal Pressure (%): 84.0 Supine % at Optimal (%): 0 Sleep % at Optimal (%): 94   SLEEP ARCHITECTURE EditMoveRemove this section The  recording time for the entire night was 444.3 minutes. During a baseline period of 272.2 minutes, the patient slept for 124.0 minutes in REM and nonREM, yielding a sleep efficiency of 45.5%. Sleep onset after lights out was 51.1 minutes with a REM latency of 198.5 minutes. The patient spent 43.55% of the night in stage N1 sleep, 54.03% in stage N2 sleep, 0.00% in stage N3 and  2.42% in REM. During the titration period of 169.9 minutes, the patient slept for 118.0 minutes in REM and nonREM, yielding a sleep efficiency of 69.4%. Sleep onset after CPAP initiation was 44.2 minutes with a REM latency of 61.0 minutes. The patient spent 11.86% of the night in stage N1 sleep, 30.93% in stage N2 sleep, 30.51% in stage N3 and 26.69% in REM. CARDIAC DATA EditMoveRemove this section The 2 lead EKG demonstrated sinus rhythm. The mean heart rate was 61.29 beats per minute. Other EKG findings include: None. LEG MOVEMENT DATA EditMoveRemove this section The total Periodic Limb Movements of Sleep (PLMS) were 139. The PLMS index was 34.46.   IMPRESSIONS  Severe obstructive sleep apnea occurred during the diagnostic portion of the study (AHI = 68.7/hour). Unsuccessful CPAP titration. Suggest formal CPAP/BiPAP study.     Delano Metz, MD Diplomate, American Board of Sleep Medicine.

## 2015-11-16 ENCOUNTER — Other Ambulatory Visit: Payer: Self-pay | Admitting: Cardiology

## 2015-11-16 MED ORDER — CARVEDILOL 12.5 MG PO TABS: ORAL_TABLET | ORAL | Status: DC

## 2015-12-07 ENCOUNTER — Telehealth: Payer: Self-pay | Admitting: Cardiology

## 2015-12-07 ENCOUNTER — Ambulatory Visit (INDEPENDENT_AMBULATORY_CARE_PROVIDER_SITE_OTHER): Payer: Medicare Other | Admitting: *Deleted

## 2015-12-07 DIAGNOSIS — I255 Ischemic cardiomyopathy: Secondary | ICD-10-CM | POA: Diagnosis not present

## 2015-12-07 NOTE — Telephone Encounter (Signed)
LMOVM reminding pt to send remote transmission.   

## 2015-12-08 NOTE — Progress Notes (Signed)
Remote ICD transmission.   

## 2015-12-10 LAB — CUP PACEART REMOTE DEVICE CHECK
Brady Statistic RV Percent Paced: 0.01 %
HIGH POWER IMPEDANCE MEASURED VALUE: 87 Ohm
Implantable Lead Model: 6935
Lead Channel Impedance Value: 456 Ohm
Lead Channel Impedance Value: 532 Ohm
Lead Channel Pacing Threshold Amplitude: 0.625 V
Lead Channel Sensing Intrinsic Amplitude: 5.75 mV
MDC IDC LEAD IMPLANT DT: 20131220
MDC IDC LEAD LOCATION: 753860
MDC IDC MSMT BATTERY REMAINING LONGEVITY: 112 mo
MDC IDC MSMT BATTERY VOLTAGE: 3.01 V
MDC IDC MSMT LEADCHNL RV PACING THRESHOLD PULSEWIDTH: 0.4 ms
MDC IDC MSMT LEADCHNL RV SENSING INTR AMPL: 5.75 mV
MDC IDC SESS DTM: 20170613232308
MDC IDC SET LEADCHNL RV PACING AMPLITUDE: 2 V
MDC IDC SET LEADCHNL RV PACING PULSEWIDTH: 0.4 ms
MDC IDC SET LEADCHNL RV SENSING SENSITIVITY: 0.3 mV

## 2015-12-15 ENCOUNTER — Encounter: Payer: Self-pay | Admitting: Cardiology

## 2016-03-08 ENCOUNTER — Ambulatory Visit (INDEPENDENT_AMBULATORY_CARE_PROVIDER_SITE_OTHER): Payer: Medicare Other | Admitting: *Deleted

## 2016-03-08 DIAGNOSIS — I255 Ischemic cardiomyopathy: Secondary | ICD-10-CM | POA: Diagnosis not present

## 2016-03-08 NOTE — Progress Notes (Signed)
Remote ICD transmission.   

## 2016-03-09 ENCOUNTER — Encounter: Payer: Self-pay | Admitting: Cardiology

## 2016-03-16 LAB — CUP PACEART REMOTE DEVICE CHECK
Battery Remaining Longevity: 109 mo
Battery Voltage: 3.02 V
Brady Statistic RV Percent Paced: 0.01 %
HIGH POWER IMPEDANCE MEASURED VALUE: 67 Ohm
Lead Channel Impedance Value: 418 Ohm
Lead Channel Pacing Threshold Pulse Width: 0.4 ms
Lead Channel Sensing Intrinsic Amplitude: 5.625 mV
Lead Channel Sensing Intrinsic Amplitude: 5.625 mV
Lead Channel Setting Pacing Amplitude: 2 V
Lead Channel Setting Pacing Pulse Width: 0.4 ms
MDC IDC LEAD IMPLANT DT: 20131220
MDC IDC LEAD LOCATION: 753860
MDC IDC LEAD MODEL: 6935
MDC IDC MSMT LEADCHNL RV IMPEDANCE VALUE: 342 Ohm
MDC IDC MSMT LEADCHNL RV PACING THRESHOLD AMPLITUDE: 0.75 V
MDC IDC SESS DTM: 20170913083826
MDC IDC SET LEADCHNL RV SENSING SENSITIVITY: 0.3 mV

## 2016-03-23 NOTE — Progress Notes (Signed)
This encounter was created in error - please disregard.

## 2016-06-09 ENCOUNTER — Encounter: Payer: Self-pay | Admitting: Internal Medicine

## 2016-06-09 ENCOUNTER — Ambulatory Visit (INDEPENDENT_AMBULATORY_CARE_PROVIDER_SITE_OTHER): Payer: Medicare Other | Admitting: Internal Medicine

## 2016-06-09 VITALS — BP 108/56 | HR 69 | Ht 71.0 in | Wt 209.0 lb

## 2016-06-09 DIAGNOSIS — I255 Ischemic cardiomyopathy: Secondary | ICD-10-CM

## 2016-06-09 DIAGNOSIS — I5022 Chronic systolic (congestive) heart failure: Secondary | ICD-10-CM

## 2016-06-09 LAB — CUP PACEART INCLINIC DEVICE CHECK
Date Time Interrogation Session: 20171215130954
HIGH POWER IMPEDANCE MEASURED VALUE: 73 Ohm
Implantable Lead Implant Date: 20131220
Implantable Pulse Generator Implant Date: 20131220
Lead Channel Pacing Threshold Amplitude: 0.625 V
Lead Channel Pacing Threshold Pulse Width: 0.4 ms
Lead Channel Sensing Intrinsic Amplitude: 13.875 mV
MDC IDC LEAD LOCATION: 753860
MDC IDC LEAD MODEL: 6935
MDC IDC MSMT BATTERY REMAINING LONGEVITY: 106 mo
MDC IDC MSMT BATTERY VOLTAGE: 3.01 V
MDC IDC MSMT LEADCHNL RV IMPEDANCE VALUE: 342 Ohm
MDC IDC MSMT LEADCHNL RV IMPEDANCE VALUE: 418 Ohm
MDC IDC MSMT LEADCHNL RV SENSING INTR AMPL: 5.5 mV
MDC IDC SET LEADCHNL RV PACING AMPLITUDE: 2 V
MDC IDC SET LEADCHNL RV PACING PULSEWIDTH: 0.4 ms
MDC IDC SET LEADCHNL RV SENSING SENSITIVITY: 0.3 mV
MDC IDC STAT BRADY RV PERCENT PACED: 0.01 %

## 2016-06-09 NOTE — Progress Notes (Signed)
HPI Charles Savage returns today for followup. He is a very pleasant 60 year old man with an ischemic cardiomyopathy, chronic systolic heart failure, status post ICD implantation. In the interim, the patient has done well. He denies chest pain, shortness of breath, or syncope. No ICD shocks. He admits to being sedentary. He has had a bit of sinusitis and is finishing up taking a Z-pack.  Allergies  Allergen Reactions  . Metoprolol Shortness Of Breath and Nausea And Vomiting  . Peach Flavor Hives and Itching    "peaches; peach flavoring; actually happened after he ate a store-bought peach pie"  . Morphine And Related Hives and Itching     Current Outpatient Prescriptions  Medication Sig Dispense Refill  . albuterol (PROVENTIL HFA;VENTOLIN HFA) 108 (90 BASE) MCG/ACT inhaler Inhale 1 puff into the lungs every 6 (six) hours as needed for wheezing or shortness of breath. 1 Inhaler 2  . aspirin 81 MG tablet Take 81 mg by mouth daily.    Marland Kitchen atorvastatin (LIPITOR) 80 MG tablet Take 1 tablet (80 mg total) by mouth daily. 90 tablet 2  . carvedilol (COREG) 12.5 MG tablet Take 12.5 mg am and 25 mg ( 2 tablets) in the pm 270 tablet 3  . dextromethorphan (DELSYM) 30 MG/5ML liquid Take 30 mg by mouth as needed for cough.     . EPIPEN 2-PAK 0.3 MG/0.3ML SOAJ injection Inject 0.3 mg as directed once.     . escitalopram (LEXAPRO) 20 MG tablet Take 20 mg by mouth daily.    . furosemide (LASIX) 20 MG tablet TAKE HALF A TABLET BY MOUTH EVERY DAY 45 tablet 3  . HYDROcodone-acetaminophen (NORCO) 10-325 MG per tablet Take 1 tablet by mouth every 8 (eight) hours as needed. Pain (Patient taking differently: Take 1 tablet by mouth every 8 (eight) hours as needed for moderate pain. Pain) 21 tablet 0  . lisinopril (PRINIVIL,ZESTRIL) 5 MG tablet Take 1 tablet (5 mg total) by mouth daily. 30 tablet 11  . meloxicam (MOBIC) 15 MG tablet Take 15 mg by mouth daily.    Marland Kitchen spironolactone (ALDACTONE) 25 MG tablet Take 0.5 tablets  (12.5 mg total) by mouth daily. 90 tablet 3  . tiotropium (SPIRIVA HANDIHALER) 18 MCG inhalation capsule Place 1 capsule (18 mcg total) into inhaler and inhale daily. 30 capsule 12   No current facility-administered medications for this visit.      Past Medical History:  Diagnosis Date  . 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 Arthritis    "back; knees" (06/14/2012)  . Cerebrovascular disease    h/o CVA in 07/2011 + left carotid endarterectomy  . CHF (congestive heart failure) ([place])   . Cholelithiasis 07/05/2012   Asymptomatic; identified on CT scanning of the chest;  . Chronic lower back pain   . COPD (chronic obstructive pulmonary disease) ([place])    on 2L O2 at home since 08/2011  . Coronary artery disease   . DDD (degenerative disc disease), lumbosacral   . Depression 09/2011   "after OHS" (06/14/2012)  . Hyperlipemia   . ICD (implantable cardiac defibrillator) in place   . Myocardial infarction 08/2011  . Other primary cardiomyopathies   . Pneumonia   . Stroke [facility]) 07/2011   denies residual (06/14/2012)    ROS:   All systems reviewed and negative except as noted in the HPI.   Past Surgical History:  Procedure Laterality Date  . BACK SURGERY    . CARDIAC CATHETERIZATION  09/2011  . CARDIAC DEFIBRILLATOR PLACEMENT  06/14/2012  . CAROTID ENDARTERECTOMY  07/2011   "left" (06/14/2012)  . COLONOSCOPY N/A 10/16/2014   Procedure: COLONOSCOPY;  Surgeon: Daneil Dolin, MD;  [facility];  Service: Endoscopy;  Laterality: N/A;  8:00 AM  . CORONARY ARTERY BYPASS GRAFT  10/20/2011   Procedure: CORONARY ARTERY BYPASS GRAFTING (CABG);  Surgeon: [name], MD;  Location: [place];  Service: Open Heart Surgery;  Laterality: N/A;  CABG x six;  using left internal mammary artery and right leg greater saphenous vein harvested endoscopically  . FOREIGN BODY REMOVAL  06/05/2011   Procedure: FOREIGN BODY REMOVAL ADULT;   Surgeon: Hermelinda Dellen;  Location: [place];  Service: Plastics;  Laterality: Right;  removal foreign body from right side face   . IMPLANTABLE CARDIOVERTER DEFIBRILLATOR IMPLANT N/A 06/14/2012   Procedure: IMPLANTABLE CARDIOVERTER DEFIBRILLATOR IMPLANT;  Surgeon: Evans Lance, MD;  Location: [facility] CATH LAB;  Service: Cardiovascular;  Laterality: N/A;  . KNEE ARTHROSCOPY  2002   "right" (06/14/2012)  . LEFT AND RIGHT HEART CATHETERIZATION WITH CORONARY ANGIOGRAM N/A 10/16/2011   Procedure: LEFT AND RIGHT HEART CATHETERIZATION WITH CORONARY ANGIOGRAM;  Surgeon: Jolaine Artist, MD;  Location: [facility] CATH LAB;  Service: Cardiovascular;  Laterality: N/A;  . LUMBAR [place] SURGERY  2012  . MITRAL VALVE REPAIR  10/20/2011   Procedure: MITRAL VALVE REPAIR (MVR);  Surgeon: [name], MD;  Location: [place];  Service: Open Heart Surgery;  Laterality: N/A;  . POSTERIOR LUMBAR FUSION  1996  . TEE WITHOUT CARDIOVERSION  10/17/2011   Procedure: TRANSESOPHAGEAL ECHOCARDIOGRAM (TEE);  Surgeon: Jolaine Artist, MD;  Location: [facility] ENDOSCOPY;  Service: Cardiovascular;  Laterality: N/A;     Family History  Problem Relation Age of Onset  . Coronary artery disease Mother   . Coronary artery disease Father   . Arrhythmia Brother      Social History   Social History  . Marital status: Married    Spouse name: N/A  . Number of children: N/A  . Years of education: N/A   Occupational History  . Not on file.   Social History Main Topics  . Smoking status: Current Every Day Smoker    Packs/day: 1.50    Years: 42.00    Types: Cigarettes    Start date: 12/12/1969  . Smokeless tobacco: Never Used     Comment: quit from 09-10-2011 and started back smoking 03-26-2012  . 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 Drug use: No  . Sexual activity: No     Comment: Quit drinking alcohol 2 yrs ago   Other Topics Concern  . Not on file    Social History Narrative   Disabled.  Formerly did logging work.  Lives with wife.     BP (!) 108/56   Pulse 69   Ht '5\' 11"'$  (1.803 m)   Wt 209 lb (94.8 kg)   SpO2 95%   BMI 29.15 kg/m   Physical Exam:  Well appearing 60 year old man,NAD HEENT: Unremarkable Neck:  6 cm JVD, no thyromegally Lungs:  Clear with no wheezes, rales, or rhonchi. HEART:  Regular rate rhythm, grade 2/6 systolic murmur, no rubs, no clicks Abd:  soft, positive bowel sounds, no organomegally, no rebound, no guarding Ext:  2 plus pulses, no edema, no cyanosis, no clubbing Skin:  No rashes no nodules Neuro:  CN II through XII intact, motor grossly intact  DEVICE  Normal device function.  See PaceArt for details.   Assess/Plan: 1. Chronic systolic heart failure - while his symptoms are stable. His optivol is up. I have asked him to take a whole lasix for the next 3 days. 2. ICD - his medtronic single chamber ICD is working normally. Will follow. 3. Sinusitis - appears to be improving on the Z pack. He denies fever or chills.  Charles Savage.D.

## 2016-06-09 NOTE — Patient Instructions (Addendum)
Your physician wants you to follow-up in: 1 Year with Dr. Lovena Le. You will receive a reminder letter in the mail two months in advance. If you don't receive a letter, please call our office to schedule the follow-up appointment.  Your physician recommends that you continue on your current medications as directed. Please refer to the Current Medication list given to you today.  Your physician has recommended you make the following change in your medication:   Take 1 whole Lasix for 3 Days after completing Z -Pac   Remote monitoring is used to monitor your Pacemaker of ICD from home. This monitoring reduces the number of office visits required to check your device to one time per year. It allows [place] to keep an eye on the functioning of your device to ensure it is working properly. You are scheduled for a device check from home on 09/12/15. You may send your transmission at any time that day. If you have a wireless device, the transmission will be sent automatically. After your physician reviews your transmission, you will receive a postcard with your next transmission date.  If you need a refill on your cardiac medications before your next appointment, please call your pharmacy.  Thank you for choosing [facility]!

## 2016-06-26 DIAGNOSIS — C3490 Malignant neoplasm of unspecified part of unspecified bronchus or lung: Secondary | ICD-10-CM

## 2016-06-26 HISTORY — DX: Malignant neoplasm of unspecified part of unspecified bronchus or lung: C34.90

## 2016-07-06 ENCOUNTER — Ambulatory Visit (INDEPENDENT_AMBULATORY_CARE_PROVIDER_SITE_OTHER): Payer: Medicare HMO | Admitting: Cardiology

## 2016-07-06 ENCOUNTER — Encounter: Payer: Self-pay | Admitting: Cardiology

## 2016-07-06 VITALS — BP 120/62 | HR 72 | Ht 71.0 in | Wt 207.0 lb

## 2016-07-06 DIAGNOSIS — I6523 Occlusion and stenosis of bilateral carotid arteries: Secondary | ICD-10-CM

## 2016-07-06 DIAGNOSIS — G4730 Sleep apnea, unspecified: Secondary | ICD-10-CM | POA: Diagnosis not present

## 2016-07-06 DIAGNOSIS — E782 Mixed hyperlipidemia: Secondary | ICD-10-CM

## 2016-07-06 DIAGNOSIS — I5022 Chronic systolic (congestive) heart failure: Secondary | ICD-10-CM | POA: Diagnosis not present

## 2016-07-06 DIAGNOSIS — I2510 Atherosclerotic heart disease of native coronary artery without angina pectoris: Secondary | ICD-10-CM

## 2016-07-06 DIAGNOSIS — I255 Ischemic cardiomyopathy: Secondary | ICD-10-CM | POA: Diagnosis not present

## 2016-07-06 NOTE — Progress Notes (Signed)
Clinical Summary Mr. Hackenberg is a 61 y.o.male seen today for follow up of the following medical problems.    1. CAD/ICM  - Echo 11/2011 LVEF 15-20%,  - CABG 09/2011 x 6 vessels (LIMA-LAD with sequential SVG to OM1, OM2, OM3. SVG-PDA o LCX, SVG to RCA.  - he has an ICD, Medtronic dual chamber ,that is followed by EP Dr. Lovena Le.  - did not tolerate coreg '25mg'$  bid, we cut back to 12.'5mg'$  in AM and '25mg'$  in PM. Overall medication titration has been limited by soft bp's.   - since last visiti he completed an echo 09/2015 that shows LVEF has normalized at 55-60% - no recent SOB or DOE. Has had some recent cold symptoms. No recent edema. No recent chest pain.  - compliant with meds  2. Mitral valve regurgitation - repair with 28 mm annuloplasty ring 09/2011 - denies any LE edema, no SOB or DOE since our last visit  3. Hyperlipidemia - compliant with statin    4. OSA screen - 09/2015 sleep study with severe OSA.  - does not appear he has been set up with CPAP  5. Carotid stenosis - 08/2015 bilateral moderate carotid stenosis.  - no recent stroke like symptoms    Past Medical History:  Diagnosis Date  . 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 Arthritis    "back; knees" (06/14/2012)  . Cerebrovascular disease    h/o CVA in 07/2011 + left carotid endarterectomy  . CHF (congestive heart failure) ([place])   . Cholelithiasis 07/05/2012   Asymptomatic; identified on CT scanning of the chest;  . Chronic lower back pain   . COPD (chronic obstructive pulmonary disease) ([place])    on 2L O2 at home since 08/2011  . Coronary artery disease   . DDD (degenerative disc disease), lumbosacral   . Depression 09/2011   "after OHS" (06/14/2012)  . Hyperlipemia   . ICD (implantable cardiac defibrillator) in place   . Myocardial infarction 08/2011  . Other primary cardiomyopathies   . Pneumonia   . Stroke [facility]) 07/2011   denies  residual (06/14/2012)     Allergies  Allergen Reactions  . Metoprolol Shortness Of Breath and Nausea And Vomiting  . Peach Flavor Hives and Itching    "peaches; peach flavoring; actually happened after he ate a store-bought peach pie"  . Morphine And Related Hives and Itching     Current Outpatient Prescriptions  Medication Sig Dispense Refill  . albuterol (PROVENTIL HFA;VENTOLIN HFA) 108 (90 BASE) MCG/ACT inhaler Inhale 1 puff into the lungs every 6 (six) hours as needed for wheezing or shortness of breath. 1 Inhaler 2  . aspirin 81 MG tablet Take 81 mg by mouth daily.    Marland Kitchen atorvastatin (LIPITOR) 80 MG tablet Take 1 tablet (80 mg total) by mouth daily. 90 tablet 2  . carvedilol (COREG) 12.5 MG tablet Take 12.5 mg am and 25 mg ( 2 tablets) in the pm 270 tablet 3  . dextromethorphan (DELSYM) 30 MG/5ML liquid Take 30 mg by mouth as needed for cough.     . EPIPEN 2-PAK 0.3 MG/0.3ML SOAJ injection Inject 0.3 mg as directed once.     . escitalopram (LEXAPRO) 20 MG tablet Take 20 mg by mouth daily.    . furosemide (LASIX) 20 MG tablet TAKE HALF A TABLET BY MOUTH EVERY DAY 45 tablet 3  . HYDROcodone-acetaminophen (NORCO) 10-325 MG per tablet Take 1 tablet by  mouth every 8 (eight) hours as needed. Pain (Patient taking differently: Take 1 tablet by mouth every 8 (eight) hours as needed for moderate pain. Pain) 21 tablet 0  . lisinopril (PRINIVIL,ZESTRIL) 5 MG tablet Take 1 tablet (5 mg total) by mouth daily. 30 tablet 11  . meloxicam (MOBIC) 15 MG tablet Take 15 mg by mouth daily.    Marland Kitchen spironolactone (ALDACTONE) 25 MG tablet Take 0.5 tablets (12.5 mg total) by mouth daily. 90 tablet 3  . tiotropium (SPIRIVA HANDIHALER) 18 MCG inhalation capsule Place 1 capsule (18 mcg total) into inhaler and inhale daily. 30 capsule 12   No current facility-administered medications for this visit.      Past Surgical History:  Procedure Laterality Date  . BACK SURGERY    . CARDIAC CATHETERIZATION  09/2011    . CARDIAC DEFIBRILLATOR PLACEMENT  06/14/2012  . CAROTID ENDARTERECTOMY  07/2011   "left" (06/14/2012)  . COLONOSCOPY N/A 10/16/2014   Procedure: COLONOSCOPY;  Surgeon: Daneil Dolin, MD;  [facility];  Service: Endoscopy;  Laterality: N/A;  8:00 AM  . CORONARY ARTERY BYPASS GRAFT  10/20/2011   Procedure: CORONARY ARTERY BYPASS GRAFTING (CABG);  Surgeon: [name], MD;  Location: [place];  Service: Open Heart Surgery;  Laterality: N/A;  CABG x six;  using left internal mammary artery and right leg greater saphenous vein harvested endoscopically  . FOREIGN BODY REMOVAL  06/05/2011   Procedure: FOREIGN BODY REMOVAL ADULT;  Surgeon: Hermelinda Dellen;  Location: [place];  Service: Plastics;  Laterality: Right;  removal foreign body from right side face   . IMPLANTABLE CARDIOVERTER DEFIBRILLATOR IMPLANT N/A 06/14/2012   Procedure: IMPLANTABLE CARDIOVERTER DEFIBRILLATOR IMPLANT;  Surgeon: Evans Lance, MD;  Location[facility] CATH LAB;  Service: Cardiovascular;  Laterality: N/A;  . KNEE ARTHROSCOPY  2002   "right" (06/14/2012)  . LEFT AND RIGHT HEART CATHETERIZATION WITH CORONARY ANGIOGRAM N/A 10/16/2011   Procedure: LEFT AND RIGHT HEART CATHETERIZATION WITH CORONARY ANGIOGRAM;  Surgeon: Jolaine Artist, MD;  Location: [facility] CATH LAB;  Service: Cardiovascular;  Laterality: N/A;  . LUMBAR [place] SURGERY  2012  . MITRAL VALVE REPAIR  10/20/2011   Procedure: MITRAL VALVE REPAIR (MVR);  Surgeon: [name], MD;  Location: [place];  Service: Open Heart Surgery;  Laterality: N/A;  . POSTERIOR LUMBAR FUSION  1996  . TEE WITHOUT CARDIOVERSION  10/17/2011   Procedure: TRANSESOPHAGEAL ECHOCARDIOGRAM (TEE);  Surgeon: Jolaine Artist, MD;  Location: [facility] ENDOSCOPY;  Service: Cardiovascular;  Laterality: N/A;     Allergies  Allergen Reactions  . Metoprolol Shortness Of Breath and Nausea And Vomiting  . Peach Flavor Hives and Itching    "peaches; peach flavoring; actually  happened after he ate a store-bought peach pie"  . Morphine And Related Hives and Itching      Family History  Problem Relation Age of Onset  . Coronary artery disease Mother   . Coronary artery disease Father   . Arrhythmia Brother      Social History Mr. Hammitt reports that he has been smoking Cigarettes.  He started smoking about 46 years ago. He has a 63.00 pack-year smoking history. He has never used smokeless tobacco. Mr. Boomershine reports that he does not drink alcohol.   Review of Systems CONSTITUTIONAL: No weight loss, fever, chills, weakness or fatigue.  HEENT: Eyes: No visual loss, blurred vision, double vision or yellow sclerae.No hearing loss, sneezing, congestion, runny nose or sore throat.  SKIN: No  rash or itching.  CARDIOVASCULAR: per HPI RESPIRATORY: No shortness of breath, cough or sputum.  GASTROINTESTINAL: No anorexia, nausea, vomiting or diarrhea. No abdominal pain or blood.  GENITOURINARY: No burning on urination, no polyuria NEUROLOGICAL: No headache, dizziness, syncope, paralysis, ataxia, numbness or tingling in the extremities. No change in bowel or bladder control.  MUSCULOSKELETAL: No muscle, back pain, joint pain or stiffness.  LYMPHATICS: No enlarged nodes. No history of splenectomy.  PSYCHIATRIC: No history of depression or anxiety.  ENDOCRINOLOGIC: No reports of sweating, cold or heat intolerance. No polyuria or polydipsia.  Marland Kitchen   Physical Examination Vitals:   07/06/16 0827  BP: 120/62  Pulse: 72   Vitals:   07/06/16 0827  Weight: 207 lb (93.9 kg)  Height: '5\' 11"'$  (1.803 m)    Gen: resting comfortably, no acute distress HEENT: no scleral icterus, pupils equal round and reactive, no palptable cervical adenopathy,  CV: RRR, no m/r/g, no jvd Resp: Clear to auscultation bilaterally GI: abdomen is soft, non-tender, non-distended, normal bowel sounds, no hepatosplenomegaly MSK: extremities are warm, no edema.  Skin: warm, no rash Neuro:  no  focal deficits Psych: appropriate affect   Diagnostic Studies 11/2011 Echo  LVEF 15-20%, anular MV ring with trivial MR, severe RV dysfunction,  09/2011 TEE  LEFT VENTRICLE: EF = 25% Mildly dilated. Global HK.   RIGHT VENTRICLE: Moderately HK  LEFT ATRIUM: Moderately dilated  LEFT ATRIAL APPENDAGE: No thrombus  RIGHT ATRIUM: Normal  AORTIC VALVE: Trileaflet. Trivial AI. No AS.  MITRAL VALVE: Structurally normal. Mild to moderate MR.  TRICUSPID VALVE: Normal Trivial TR  PULMONIC VALVE: Normal  INTERATRIAL SEPTUM: No ASD or PFO.  PERICARDIUM: Moderate effusion along RA//RV. No tamponade.  DESCENDING AORTA: Severe plaque.  09/2011 Cath  Findings:  On milrinone 0.25 mg.kg/min  RA = 5  RV = 30/5/7  PA = 30/16 (23)  PCW = 20 (no significant v-waves)  Fick cardiac output/index = 5.46/2.8  PVR = 0.5 [place]  SVR = 981  FA sat = 93%  PA sat = 67%, 71% (on milrinone)  Ao Pressure: 92/63 (76)  LV Pressure: 98/8/18  There was no signficant gradient across the aortic valve on pullback.  Left main: 80-90% distal  LAD: Flush occlusion at ostium. Mild filling in mid and distal segments through L to L and R to L collaterals  LCX: Large. Dominant. 70-80% mid AV groove. OM-1 small to moderate vessel with mild ostial disease. OM-2 very large 99% lesion in mid section at trifurcation. 90% lesion in lowest Adena Sima. PDA 50-60 ostial  RCA: Small to moderate-sized, non- dominant vessel ending with acute marginal Arrie Borrelli. Diffuse 60% prox to mid. 95% midsection. R to L collats to LAD from acute marginal.  LV-gram done in the RAO projection: Ejection fraction = 25-30% with global HK 3+ MR  L subclavian: Widely patent to chest wall with mild plaquing  Assessment:  1. 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 Ischemic CM with EF 25-30%  3. 3+ mitral regurgitation  4. Well compensated hemodynamics on milrinone  Plan/Discussion:  Will need CABG/MVR.  Check PFTs and TEE. Continue milrinone. Transfer stepdown. Consult TCTS.   09/2015 echo Study Conclusions  - Left ventricle: The cavity size was mildly dilated. Wall   thickness was normal. Systolic function was normal. The estimated   ejection fraction was in the range of 55% to 60%. Wall motion was   normal; there were no regional wall motion abnormalities.   Features are consistent  with a pseudonormal left ventricular   filling pattern, with concomitant abnormal relaxation and   increased filling pressure (grade 2 diastolic dysfunction). - Aortic valve: There was mild regurgitation. - Mitral valve: Mildly thickened leaflets . There was trivial   regurgitation. Valve area by pressure half-time: 2.27 cm^2. - Left atrium: The atrium was mildly dilated. - Right ventricle: Pacer wire or catheter noted in right ventricle. - Right atrium: The atrium was mildly dilated. - Atrial septum: No defect or patent foramen ovale was identified. - Tricuspid valve: There was trivial regurgitation. - Pulmonary arteries: PA peak pressure: 34 mm Hg (S). - Pericardium, extracardiac: There was no pericardial effusion.  Impressions:  - Mildly dilated LV chamber size with LVEF 55-60%. Grade 2   diastolic dysfunction with increased LV filling pressure. Mild   left atrial enlargement. Status post mitral annuloplasty with   mildly thickened leaflets and trivial mitral regurgitation. Mild   aortic regurgitation. Device wire noted within the right heart.   Trivial tricuspid regurgitation with PASP 34 mmHg.   Assessment and Plan   1. CAD/ICM/Chronic systolic HF - LVEF 97-58% by echo 11/2011, NYHA III, he has a Medtronic ICD - titration of medications has been limited by low blood pressures and orthostatic symptoms. Most recent echo shows normalization of LVEF - we will continue current meds  2. Mitral valve regurgitation - s/p repair, no symptoms - continue to monitor  3. Hyperlipidemia -  continue high dose statin in setting of CAD - request labs from pcp  4. OSA  - refer to Dr Luan Pulling. Abnormal test, just needs to get set up with CPAP  5. Carotid stenosis - moderate bilatera disease. Continue to monitor       Arnoldo Lenis, M.D

## 2016-07-06 NOTE — Patient Instructions (Signed)

## 2016-07-07 ENCOUNTER — Other Ambulatory Visit: Payer: Self-pay

## 2016-07-07 MED ORDER — CARVEDILOL 12.5 MG PO TABS: ORAL_TABLET | ORAL | 3 refills | Status: DC

## 2016-07-07 MED ORDER — LISINOPRIL 5 MG PO TABS: 5.0000 mg | ORAL_TABLET | Freq: Every day | ORAL | 3 refills | Status: DC

## 2016-07-07 NOTE — Telephone Encounter (Signed)
Refilled coreg and lisinopril to [facility]

## 2016-07-11 ENCOUNTER — Other Ambulatory Visit: Payer: Self-pay

## 2016-08-25 ENCOUNTER — Other Ambulatory Visit: Payer: Self-pay | Admitting: Cardiology

## 2016-09-07 DIAGNOSIS — G4733 Obstructive sleep apnea (adult) (pediatric): Secondary | ICD-10-CM | POA: Diagnosis not present

## 2016-09-07 DIAGNOSIS — I10 Essential (primary) hypertension: Secondary | ICD-10-CM | POA: Diagnosis not present

## 2016-09-07 DIAGNOSIS — J449 Chronic obstructive pulmonary disease, unspecified: Secondary | ICD-10-CM | POA: Diagnosis not present

## 2016-09-07 DIAGNOSIS — F17200 Nicotine dependence, unspecified, uncomplicated: Secondary | ICD-10-CM | POA: Diagnosis not present

## 2016-09-11 ENCOUNTER — Other Ambulatory Visit: Payer: Self-pay | Admitting: Acute Care

## 2016-09-11 ENCOUNTER — Other Ambulatory Visit: Payer: Self-pay | Admitting: Cardiology

## 2016-09-11 ENCOUNTER — Ambulatory Visit (INDEPENDENT_AMBULATORY_CARE_PROVIDER_SITE_OTHER): Payer: Medicare HMO | Admitting: *Deleted

## 2016-09-11 DIAGNOSIS — I255 Ischemic cardiomyopathy: Secondary | ICD-10-CM

## 2016-09-11 DIAGNOSIS — F17210 Nicotine dependence, cigarettes, uncomplicated: Secondary | ICD-10-CM

## 2016-09-11 DIAGNOSIS — I5022 Chronic systolic (congestive) heart failure: Secondary | ICD-10-CM

## 2016-09-11 NOTE — Progress Notes (Signed)
Remote ICD transmission.   

## 2016-09-13 ENCOUNTER — Encounter: Payer: Self-pay | Admitting: Cardiology

## 2016-09-13 DIAGNOSIS — G894 Chronic pain syndrome: Secondary | ICD-10-CM | POA: Diagnosis not present

## 2016-09-13 DIAGNOSIS — Z6832 Body mass index (BMI) 32.0-32.9, adult: Secondary | ICD-10-CM | POA: Diagnosis not present

## 2016-09-13 DIAGNOSIS — E6609 Other obesity due to excess calories: Secondary | ICD-10-CM | POA: Diagnosis not present

## 2016-09-13 DIAGNOSIS — Z1389 Encounter for screening for other disorder: Secondary | ICD-10-CM | POA: Diagnosis not present

## 2016-09-13 LAB — CUP PACEART REMOTE DEVICE CHECK
Brady Statistic RV Percent Paced: 0.01 %
HIGH POWER IMPEDANCE MEASURED VALUE: 66 Ohm
Implantable Lead Implant Date: 20131220
Implantable Lead Model: 6935
Lead Channel Impedance Value: 399 Ohm
Lead Channel Pacing Threshold Amplitude: 0.625 V
Lead Channel Pacing Threshold Pulse Width: 0.4 ms
Lead Channel Setting Pacing Pulse Width: 0.4 ms
MDC IDC LEAD LOCATION: 753860
MDC IDC MSMT BATTERY REMAINING LONGEVITY: 102 mo
MDC IDC MSMT BATTERY VOLTAGE: 3.01 V
MDC IDC MSMT LEADCHNL RV IMPEDANCE VALUE: 418 Ohm
MDC IDC MSMT LEADCHNL RV SENSING INTR AMPL: 4.875 mV
MDC IDC MSMT LEADCHNL RV SENSING INTR AMPL: 4.875 mV
MDC IDC PG IMPLANT DT: 20131220
MDC IDC SESS DTM: 20180319073328
MDC IDC SET LEADCHNL RV PACING AMPLITUDE: 2 V
MDC IDC SET LEADCHNL RV SENSING SENSITIVITY: 0.3 mV

## 2016-09-29 ENCOUNTER — Ambulatory Visit (INDEPENDENT_AMBULATORY_CARE_PROVIDER_SITE_OTHER): Payer: Medicare HMO | Admitting: Acute Care

## 2016-09-29 ENCOUNTER — Encounter: Payer: Self-pay | Admitting: Acute Care

## 2016-09-29 ENCOUNTER — Ambulatory Visit (INDEPENDENT_AMBULATORY_CARE_PROVIDER_SITE_OTHER)
Admission: RE | Admit: 2016-09-29 | Discharge: 2016-09-29 | Disposition: A | Discharge status: Home / Self Care | Payer: Medicare HMO | Source: Ambulatory Visit | Attending: Acute Care | Admitting: Acute Care

## 2016-09-29 DIAGNOSIS — F17210 Nicotine dependence, cigarettes, uncomplicated: Secondary | ICD-10-CM

## 2016-09-29 NOTE — Progress Notes (Signed)
Shared Decision Making Visit Lung Cancer Screening Program [phone])   Eligibility:  Age 61 y.o.  Pack Years Smoking History Calculation 92 pack year smoking history (# packs/per year x # years smoked)  Recent History of coughing up blood  no  Unexplained weight loss? no ( >Than 15 pounds within the last 6 months )  Prior History Lung / other cancer no (Diagnosis within the last 5 years already requiring surveillance chest CT Scans).  Smoking Status Current Smoker  Former Smokers: Years since quit: NA   Quit Date: NA  Visit Components:  Discussion included one or more decision making aids. yes  Discussion included risk/benefits of screening. yes  Discussion included potential follow up diagnostic testing for abnormal scans. yes  Discussion included meaning and risk of over diagnosis. yes  Discussion included meaning and risk of False Positives. yes  Discussion included meaning of total radiation exposure. yes  Counseling Included:  Importance of adherence to annual lung cancer LDCT screening. yes  Impact of comorbidities on ability to participate in the program. yes  Ability and willingness to under diagnostic treatment. yes  Smoking Cessation Counseling:  Current Smokers:   Discussed importance of smoking cessation. yes  Information about tobacco cessation classes and interventions provided to patient. yes  Patient provided with "ticket" for LDCT Scan. yes  Symptomatic Patient. no  Counseling  Diagnosis Code: Tobacco Use Z72.0  Asymptomatic Patient yes  Counseling (Intermediate counseling: > three minutes counseling) [id number]  Former Smokers:   Discussed the importance of maintaining cigarette abstinence. yes  Diagnosis Code: Personal History of Nicotine Dependence. D66.440  Information about tobacco cessation classes and interventions provided to patient. Yes  Patient provided with "ticket" for LDCT Scan. yes  Written Order for Lung Cancer  Screening with LDCT placed in Epic. Yes (CT Chest Lung Cancer Screening Low Dose W/O CM) [id number] Z12.2-Screening of respiratory organs Z87.891-Personal history of nicotine dependence  I have spent 25 minutes of face to face time with [name] and his wife discussing the risks and benefits of lung cancer screening. We viewed a power point together that explained in detail the above noted topics. We paused at intervals to allow for questions to be asked and answered to ensure understanding.We discussed that the single most powerful action that he can take to decrease his risk of developing lung cancer is to quit smoking. We discussed whether or not he is ready to commit to setting a quit date.He is not ready to set a quit date. We discussed options for tools to aid in quitting smoking including nicotine replacement therapy, non-nicotine medications, support groups, Quit Smart classes, and behavior modification. We discussed that often times setting smaller, more achievable goals, such as eliminating 1 cigarette a day for a week and then 2 cigarettes a day for a week can be helpful in slowly decreasing the number of cigarettes smoked. This allows for a sense of accomplishment as well as providing a clinical benefit. I gave him the " Be Stronger Than Your Excuses" card with contact information for community resources, classes, free nicotine replacement therapy, and access to mobile apps, text messaging, and on-line smoking cessation help. I have also offered him  my card and contact information in the event he  needs to contact me. We discussed the time and location of the scan, and that either Doroteo Glassman, RN  or I will call with the results within 24-48 hours of receiving them. I have provided him  with a copy of  the power point we viewed  as a resource in the event they need reinforcement of the concepts we discussed today in the office. The patient verbalized understanding of all of  the above and had no  further questions upon leaving the office. They have my contact information in the event they have any further questions.  I spent 4 minutes counseling patient  about smoking cessation in this OV.  We discussed the high incidence of CAD noted on this exam. Pt. Is currently on statin therapy, and is seen by cardiology ( Dr. Harl Bowie, Linna Hoff). Pt.  verbalized understanding of the above.     Magdalen Spatz, NP 09/29/2016

## 2016-10-03 ENCOUNTER — Other Ambulatory Visit: Payer: Self-pay | Admitting: Acute Care

## 2016-10-03 ENCOUNTER — Telehealth: Payer: Self-pay | Admitting: Emergency Medicine

## 2016-10-03 DIAGNOSIS — R9389 Abnormal findings on diagnostic imaging of other specified body structures: Secondary | ICD-10-CM

## 2016-10-03 DIAGNOSIS — J438 Other emphysema: Secondary | ICD-10-CM

## 2016-10-03 DIAGNOSIS — J441 Chronic obstructive pulmonary disease with (acute) exacerbation: Secondary | ICD-10-CM

## 2016-10-03 NOTE — Telephone Encounter (Signed)
I have called Pam Kreitz with the results of [name]'s low dose screening CT. I explained that his scan was abnormal. I explained that his scan was read as a Lung RADS 4 B indicating  suspicious findings for which additional diagnostic testing and or tissue sampling is recommended. I explained to Charles Savage that I have reviewed the scan with Dr. Baltazar Apo. I explained to her that his recommendation is for PET scan, and pulmonary function tests now. And based on those results we will determine best plan of care. This Charles Savage asked if both tests could be done on a Friday afternoon or on a Friday so that she can take the day off. I explained to her that I will relay this information to the schedulers. I ensured that she has our contact information in the event that either she or [name] have any further questions. She states she has my card and contact information. She had no further questions at completion of the phone call. I will fax scan results to patient's primary care physician Dr. Sharilyn Sites, and his pulmonologist Dr. Sinda Du.

## 2016-10-03 NOTE — Telephone Encounter (Signed)
I reviewed the patient's low-dose CT scan performed on 09/29/16. There is a lobulated, spiculated right lower lobe lesion that appears to be impacting and pulling on the pleura. Based on review I recommend PET scan to look for distant disease, pulmonary function testing to assess his potential suitability for primary resection. If there is no distant disease and imaging consistent with stage I lung cancer then would refer him immediately to thoracic surgery for evaluation of possible primary resection.   If his pulmonary function precludes a primary resection then transthoracic needle biopsy or navigational bronchoscopy and biopsy would be alternatives.  Baltazar Apo, MD, PhD 10/03/2016, 10:03 AM [facility] [phone] or if no answer [phone]

## 2016-10-05 ENCOUNTER — Telehealth: Payer: Self-pay | Admitting: Acute Care

## 2016-10-05 NOTE — Telephone Encounter (Signed)
The patient will follow up with [place] after PET . We will call with results and let them know plan at that time. Thanks so much.

## 2016-10-05 NOTE — Telephone Encounter (Addendum)
Spoke with pt wife, who states pt's PCP asked if pt would f/u with [place] for Dr. Luan Pulling after upcoming PET.  I explained  that pt would probably continue his care with Dr. Luan Pulling after receiving results, as he was referred here only for lung cancer referral.   SG please advise. Thanks.

## 2016-10-05 NOTE — Telephone Encounter (Signed)
Pt's spouse aware of below message and voiced her understanding. Nothing further needed.

## 2016-10-06 ENCOUNTER — Ambulatory Visit (HOSPITAL_COMMUNITY)
Admission: RE | Admit: 2016-10-06 | Discharge: 2016-10-06 | Disposition: A | Discharge status: Home / Self Care | Payer: Medicare HMO | Source: Ambulatory Visit | Attending: Acute Care | Admitting: Acute Care

## 2016-10-06 DIAGNOSIS — J438 Other emphysema: Secondary | ICD-10-CM

## 2016-10-11 ENCOUNTER — Ambulatory Visit (HOSPITAL_COMMUNITY)
Admission: RE | Admit: 2016-10-11 | Discharge: 2016-10-11 | Disposition: A | Discharge status: Home / Self Care | Payer: Medicare HMO | Source: Ambulatory Visit | Attending: Acute Care | Admitting: Acute Care

## 2016-10-11 DIAGNOSIS — J438 Other emphysema: Secondary | ICD-10-CM | POA: Insufficient documentation

## 2016-10-11 LAB — PULMONARY FUNCTION TEST
DL/VA % pred: 52 %
DL/VA: 2.44 ml/min/mmHg/L
DLCO UNC: 10.74 ml/min/mmHg
DLCO cor % pred: 31 %
DLCO cor: 10.74 ml/min/mmHg
DLCO unc % pred: 31 %
FEF 25-75 PRE: 1.06 L/s
FEF 25-75 Post: 1.5 L/sec
FEF2575-%Change-Post: 41 %
FEF2575-%PRED-POST: 50 %
FEF2575-%Pred-Pre: 35 %
FEV1-%Change-Post: 12 %
FEV1-%PRED-POST: 67 %
FEV1-%Pred-Pre: 59 %
FEV1-PRE: 2.19 L
FEV1-Post: 2.47 L
FEV1FVC-%Change-Post: 11 %
FEV1FVC-%Pred-Pre: 79 %
FEV6-%CHANGE-POST: 1 %
FEV6-%PRED-POST: 78 %
FEV6-%Pred-Pre: 77 %
FEV6-PRE: 3.62 L
FEV6-Post: 3.66 L
FEV6FVC-%CHANGE-POST: 0 %
FEV6FVC-%PRED-PRE: 103 %
FEV6FVC-%Pred-Post: 103 %
FVC-%CHANGE-POST: 0 %
FVC-%Pred-Post: 76 %
FVC-%Pred-Pre: 75 %
FVC-Post: 3.73 L
FVC-Pre: 3.69 L
POST FEV1/FVC RATIO: 66 %
POST FEV6/FVC RATIO: 99 %
Pre FEV1/FVC ratio: 59 %
Pre FEV6/FVC Ratio: 98 %
RV % PRED: 132 %
RV: 3.1 L
TLC % pred: 72 %
TLC: 5.23 L

## 2016-10-11 MED ORDER — ALBUTEROL SULFATE (2.5 MG/3ML) 0.083% IN NEBU
2.5000 mg | INHALATION_SOLUTION | Freq: Once | RESPIRATORY_TRACT | Status: AC
  Administered 2016-10-11: 2.5 mg via RESPIRATORY_TRACT

## 2016-10-13 ENCOUNTER — Ambulatory Visit (HOSPITAL_COMMUNITY)
Admission: RE | Admit: 2016-10-13 | Discharge: 2016-10-13 | Disposition: A | Discharge status: Home / Self Care | Payer: Medicare HMO | Source: Ambulatory Visit | Attending: Acute Care | Admitting: Acute Care

## 2016-10-13 DIAGNOSIS — R918 Other nonspecific abnormal finding of lung field: Secondary | ICD-10-CM | POA: Insufficient documentation

## 2016-10-13 DIAGNOSIS — J439 Emphysema, unspecified: Secondary | ICD-10-CM | POA: Insufficient documentation

## 2016-10-13 DIAGNOSIS — J90 Pleural effusion, not elsewhere classified: Secondary | ICD-10-CM | POA: Insufficient documentation

## 2016-10-13 DIAGNOSIS — I700 Atherosclerosis of aorta: Secondary | ICD-10-CM | POA: Diagnosis not present

## 2016-10-13 DIAGNOSIS — R938 Abnormal findings on diagnostic imaging of other specified body structures: Secondary | ICD-10-CM | POA: Diagnosis present

## 2016-10-13 DIAGNOSIS — R9389 Abnormal findings on diagnostic imaging of other specified body structures: Secondary | ICD-10-CM

## 2016-10-13 LAB — GLUCOSE, CAPILLARY: GLUCOSE-CAPILLARY: 170 mg/dL — AB (ref 65–99)

## 2016-10-13 MED ORDER — FLUDEOXYGLUCOSE F - 18 (FDG) INJECTION: 10.3000 | Freq: Once | INTRAVENOUS | Status: DC | PRN

## 2016-10-16 ENCOUNTER — Telehealth: Payer: Self-pay | Admitting: Acute Care

## 2016-10-16 DIAGNOSIS — R911 Solitary pulmonary nodule: Secondary | ICD-10-CM

## 2016-10-16 NOTE — Telephone Encounter (Signed)
Charles Savage return my call. I explained to her that Charles Savage PET scan indicated a 3 x 1.6 cm mass in his right lower lobe that is hypermetabolic. I explained that this is highly suspicious for bronchogenic carcinoma. I also explained that there was no evidence of thoracic nodal or distant metastatic disease. I explained that the next step would be biopsy. Charles Savage is currently reviewing the patient's scan to determine best biopsy options. I will call the patient and his wife with Charles Savage recommendation. We will order and schedule whatever is determined to be the best biopsy option. Charles Savage verbalized understanding of the above and had no further questions. She has our contact information to call if she has any questions in the near future.

## 2016-10-16 NOTE — Telephone Encounter (Signed)
Pt's spouse is aware of below message and voiced her understanding. Referral has been made.  Nothing further needed.

## 2016-10-16 NOTE — Telephone Encounter (Signed)
IMPRESSION: 3 cm hypermetabolic mass in posterior right lower lobe,, highly suspicious for bronchogenic carcinoma.  No evidence of thoracic nodal or distant metastatic disease. Tiny right pleural effusion, without associated hypermetabolic activity.  Mild emphysema.  Aortic atherosclerosis.  Pt's wife is aware that results have not been signed off on and will forward to SG to advise.

## 2016-10-16 NOTE — Telephone Encounter (Signed)
I have returned [place] call. There was no answer. I have left a message requesting that she call the office for the results of her husband's scan. I have given her the contact number. I have explained our hours today. I will await her return call.

## 2016-10-16 NOTE — Telephone Encounter (Signed)
Please call patient's wife and explain that I spoke with Dr. Lamonte Sakai. Let her know that he feels the best option is for referral to  [facility] for further evaluation of pulmonary nodule. Please place consults for either Dr. Roxan Hockey or Dr. Servando Snare to evaluate for primary resection of stage I pulmonary nodule. Please send PFT results so they are available during consultation with thoracic surgery. Thank you so much

## 2016-10-19 DIAGNOSIS — J449 Chronic obstructive pulmonary disease, unspecified: Secondary | ICD-10-CM | POA: Diagnosis not present

## 2016-10-19 DIAGNOSIS — Z79891 Long term (current) use of opiate analgesic: Secondary | ICD-10-CM | POA: Diagnosis not present

## 2016-10-19 DIAGNOSIS — G8929 Other chronic pain: Secondary | ICD-10-CM | POA: Diagnosis not present

## 2016-10-19 DIAGNOSIS — Z6831 Body mass index (BMI) 31.0-31.9, adult: Secondary | ICD-10-CM | POA: Diagnosis not present

## 2016-10-19 DIAGNOSIS — Z719 Counseling, unspecified: Secondary | ICD-10-CM | POA: Diagnosis not present

## 2016-10-25 ENCOUNTER — Encounter: Payer: Self-pay | Admitting: Surgery

## 2016-10-25 ENCOUNTER — Institutional Professional Consult (permissible substitution) (INDEPENDENT_AMBULATORY_CARE_PROVIDER_SITE_OTHER): Payer: Medicare HMO | Admitting: Surgery

## 2016-10-25 ENCOUNTER — Other Ambulatory Visit: Payer: Self-pay | Admitting: *Deleted

## 2016-10-25 VITALS — BP 95/66 | HR 65 | Resp 20 | Ht 71.0 in | Wt 211.0 lb

## 2016-10-25 DIAGNOSIS — R918 Other nonspecific abnormal finding of lung field: Secondary | ICD-10-CM

## 2016-10-25 DIAGNOSIS — R911 Solitary pulmonary nodule: Secondary | ICD-10-CM | POA: Diagnosis not present

## 2016-10-25 NOTE — Progress Notes (Signed)
Cardiothoracic Surgery Consultation  PCP is Purvis Kilts, MD Referring Provider is Magdalen Spatz, NP  Chief Complaint  Patient presents with  . Lung Lesion    Surgical eval, PET Scan 10/13/16, Chest CT 09/29/16, HX of CABG 09/2011    HPI:  The patient is a 61 year old gentleman with a 90+ pack year smoking history who still smokes 2 ppd despite undergoing CABG x 6 and mitral valve repair by me in 2013. He had an ICD placed for ischemic cardiomyopathy but his EF has recovered and his echo in 09/2015 showed his EF to be 55-60%.  He has a history of COPD and uses oxygen at home. He had a lung cancer screening CT on 10/02/2016 that showed a spiculated mass in the posterior aspect of the RLL. A PET scan showed hypermetabolic activity in this mass but no other uptake anywhere.   Past Medical History:  Diagnosis Date  . 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 Arthritis    "back; knees" (06/14/2012)  . Cerebrovascular disease    h/o CVA in 07/2011 + left carotid endarterectomy  . CHF (congestive heart failure) ([place])   . Cholelithiasis 07/05/2012   Asymptomatic; identified on CT scanning of the chest;  . Chronic lower back pain   . COPD (chronic obstructive pulmonary disease) ([place])    on 2L O2 at home since 08/2011  . Coronary artery disease   . DDD (degenerative disc disease), lumbosacral   . Depression 09/2011   "after OHS" (06/14/2012)  . Hyperlipemia   . ICD (implantable cardiac defibrillator) in place   . Myocardial infarction ([place]) 08/2011  . Other primary cardiomyopathies   . Pneumonia   . Stroke [facility]) 07/2011   denies residual (06/14/2012)    Past Surgical History:  Procedure Laterality Date  . BACK SURGERY    . CARDIAC CATHETERIZATION  09/2011  . CARDIAC DEFIBRILLATOR PLACEMENT  06/14/2012  . CAROTID ENDARTERECTOMY  07/2011   "left" (06/14/2012)  . COLONOSCOPY N/A 10/16/2014   Procedure: COLONOSCOPY;  Surgeon:  Daneil Dolin, MD;  Location: [facility];  Service: Endoscopy;  Laterality: N/A;  8:00 AM  . CORONARY ARTERY BYPASS GRAFT  10/20/2011   Procedure: CORONARY ARTERY BYPASS GRAFTING (CABG);  Surgeon: [name], MD;  Location: [facility];  Service: Open Heart Surgery;  Laterality: N/A;  CABG x six;  using left internal mammary artery and right leg greater saphenous vein harvested endoscopically  . FOREIGN BODY REMOVAL  06/05/2011   Procedure: FOREIGN BODY REMOVAL ADULT;  Surgeon: Hermelinda Dellen;  Location: [facility];  Service: Plastics;  Laterality: Right;  removal foreign body from right side face   . IMPLANTABLE CARDIOVERTER DEFIBRILLATOR IMPLANT N/A 06/14/2012   Procedure: IMPLANTABLE CARDIOVERTER DEFIBRILLATOR IMPLANT;  Surgeon: Evans Lance, MD;  Location: [facility] CATH LAB;  Service: Cardiovascular;  Laterality: N/A;  . KNEE ARTHROSCOPY  2002   "right" (06/14/2012)  . LEFT AND RIGHT HEART CATHETERIZATION WITH CORONARY ANGIOGRAM N/A 10/16/2011   Procedure: LEFT AND RIGHT HEART CATHETERIZATION WITH CORONARY ANGIOGRAM;  Surgeon: Jolaine Artist, MD;  Location: [facility] CATH LAB;  Service: Cardiovascular;  Laterality: N/A;  . LUMBAR [place] SURGERY  2012  . MITRAL VALVE REPAIR  10/20/2011   Procedure: MITRAL VALVE REPAIR (MVR);  Surgeon: [name], MD;  Location: [facility];  Service: Open Heart Surgery;  Laterality: N/A;  . POSTERIOR LUMBAR FUSION  1996  .  TEE WITHOUT CARDIOVERSION  10/17/2011   Procedure: TRANSESOPHAGEAL ECHOCARDIOGRAM (TEE);  Surgeon: Jolaine Artist, MD;  Location: [facility] ENDOSCOPY;  Service: Cardiovascular;  Laterality: N/A;    Family History  Problem Relation Age of Onset  . Coronary artery disease Mother   . Coronary artery disease Father   . Arrhythmia Brother     Social History Social History  Substance Use Topics  . Smoking status: Current Every Day Smoker    Packs/day: 2.00    Years: 46.00    Types: Cigarettes    Start date: 12/12/1969  . Smokeless  tobacco: Never Used     Comment: quit from 09-10-2011 and started back smoking 03-26-2012  . 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 albuterol (PROVENTIL HFA;VENTOLIN HFA) 108 (90 BASE) MCG/ACT inhaler Inhale 1 puff into the lungs every 6 (six) hours as needed for wheezing or shortness of breath. 1 Inhaler 2  . aspirin 81 MG tablet Take 81 mg by mouth daily.    Marland Kitchen atorvastatin (LIPITOR) 80 MG tablet TAKE ONE (1) TABLET EACH DAY 90 tablet 3  . carvedilol (COREG) 12.5 MG tablet Take 12.5 mg am and 25 mg ( 2 tablets) in the pm (Patient taking differently: Take 12.5 mg by mouth. Take 12.5 mg am and 25 mg ( 2 tablets) in the pm) 270 tablet 3  . dextromethorphan (DELSYM) 30 MG/5ML liquid Take 30 mg by mouth as needed for cough.     . EPIPEN 2-PAK 0.3 MG/0.3ML SOAJ injection Inject 0.3 mg as directed once.     . escitalopram (LEXAPRO) 20 MG tablet Take 20 mg by mouth daily.    . furosemide (LASIX) 20 MG tablet TAKE ONE-HALF TABLET DAILY 45 tablet 3  . HYDROcodone-acetaminophen (NORCO) 10-325 MG per tablet Take 1 tablet by mouth every 8 (eight) hours as needed. Pain (Patient taking differently: Take 1 tablet by mouth every 8 (eight) hours as needed for moderate pain. Pain) 21 tablet 0  . lisinopril (PRINIVIL,ZESTRIL) 5 MG tablet Take 1 tablet (5 mg total) by mouth daily. 90 tablet 3  . meloxicam (MOBIC) 15 MG tablet Take 15 mg by mouth daily.    Marland Kitchen spironolactone (ALDACTONE) 25 MG tablet TAKE 1/2 TABLET ONCE DAILY 90 tablet 3  . tiotropium (SPIRIVA HANDIHALER) 18 MCG inhalation capsule Place 1 capsule (18 mcg total) into inhaler and inhale daily. 30 capsule 12   No current facility-administered medications for this visit.     Allergies  Allergen Reactions  . Metoprolol Shortness Of Breath and Nausea And Vomiting  . Peach Flavor Hives and Itching    "peaches; peach flavoring;  actually happened after he ate a store-bought peach pie"  . Morphine And Related Hives and Itching    Review of Systems  Constitutional: Positive for activity change, appetite change and unexpected weight change. Negative for fatigue.       15 lb weight loss over the past 6 months  HENT: Negative.   Eyes: Negative.   Respiratory: Positive for cough and shortness of breath.   Cardiovascular: Negative for chest pain, palpitations and leg swelling.  Gastrointestinal: Negative.   Endocrine: Negative.   Genitourinary: Negative.   Musculoskeletal: Positive for arthralgias, gait problem, joint swelling and myalgias.  Skin: Negative.   Allergic/Immunologic: Negative.   Neurological: Negative for dizziness, syncope, speech difficulty, weakness, light-headedness, numbness and headaches.  Prior stroke 2013 with minimal residual effect  Hematological: Negative.   Psychiatric/Behavioral: Negative.     BP 95/66   Pulse 65   Resp 20   Ht '5\' 11"'$  (1.803 m)   Wt 211 lb (95.7 kg)   SpO2 94%   BMI 29.43 kg/m  Physical Exam  Constitutional: He is oriented to person, place, and time. He appears well-developed and well-nourished. No distress.  HENT:  Head: Normocephalic and atraumatic.  Mouth/Throat: Oropharynx is clear and moist.  Eyes: Conjunctivae and EOM are normal. Pupils are equal, round, and reactive to light.  Neck: Normal range of motion. Neck supple. No JVD present. No thyromegaly present.  Cardiovascular: Normal rate, regular rhythm and normal heart sounds.   No murmur heard. Pulmonary/Chest: Effort normal.  Decreased breath sounds throughout  Abdominal: Soft. Bowel sounds are normal. He exhibits no distension and no mass. There is no tenderness.  Musculoskeletal: Normal range of motion. He exhibits no edema.  Lymphadenopathy:    He has no cervical adenopathy.  Neurological: He is alert and oriented to person, place, and time. He has normal strength. No cranial nerve deficit or  sensory deficit.  Skin: Skin is warm and dry.  Psychiatric: He has a normal mood and affect.     Diagnostic Tests:  CLINICAL DATA:  Current smoker, 92 pack-year history, lung cancer screening.  EXAM: CT CHEST WITHOUT CONTRAST LOW-DOSE FOR LUNG CANCER SCREENING  TECHNIQUE: Multidetector CT imaging of the chest was performed following the standard protocol without IV contrast.  COMPARISON:  CT abdomen pelvis 07/25/2013 and CT chest 09/21/2011, 02/16/2011.  FINDINGS: Cardiovascular: Atherosclerotic calcification of the arterial vasculature. Heart is at the upper limits of normal in size. No pericardial effusion.  Mediastinum/Nodes: No pathologically enlarged mediastinal or axillary lymph nodes. Hilar regions are difficult to evaluate without IV contrast but appear grossly unremarkable. Esophagus is grossly unremarkable.  Lungs/Pleura: Centrilobular and paraseptal emphysema. Superimposed subpleural ground-glass, upper and midlung zone predominant, similar to 02/16/2011. Scattered pleuroparenchymal scarring. Spiculated area of nodular consolidation is seen in the posterior aspect of the right lower lobe, with adjacent pleural thickening and a volume-derived mean diameter of 28.6 mm (series 3, image 201). Finding appears new from 07/25/2013. Additional noncalcified pulmonary nodules measure 5.2 mm or less in size. Scattered calcified granulomas. Scattered pleural thickening. No pleural fluid. Airway is unremarkable.  Upper Abdomen: Visualized portions of the liver, gallbladder, adrenal glands, kidneys, spleen, pancreas, stomach and bowel are grossly unremarkable. No upper abdominal adenopathy.  Musculoskeletal: No worrisome lytic or sclerotic lesions. Degenerative changes are seen in the spine.  IMPRESSION: 1. Spiculated nodular consolidation in the right lower lobe is new from 07/25/2013. Lung-RADS Category 4B, suspicious. Additional imaging evaluation or  consultation with pulmonary medicine or thoracic surgery recommended. These results will be called to the ordering clinician or representative by the Radiologist Assistant, and communication documented in the PACS or zVision Dashboard. 2.  Aortic atherosclerosis (ICD10-170.0). 3. Emphysema (ICD10-J43.9). Superimposed mild interstitial lung disease, indicative of nonspecific interstitial pneumonitis.   Electronically Signed   By: Lorin Picket M.D.   On: 10/02/2016 08:08   CLINICAL DATA:  Initial treatment strategy for right lower lobe pulmonary nodule.  EXAM: NUCLEAR MEDICINE PET SKULL BASE TO THIGH  TECHNIQUE: 10.3 mCi F-18 FDG was injected intravenously. Full-ring PET imaging was performed from the skull base to thigh after the radiotracer. CT data was obtained and used for attenuation correction and anatomic localization.  FASTING BLOOD GLUCOSE:  Value: 170 mg/dl  COMPARISON:  Chest CT on 09/29/2016  FINDINGS: NECK  No hypermetabolic lymph nodes in the neck.  CHEST  No hypermetabolic mediastinal, hilar, or axillary nodes. An ill-defined 3.0 x 1.6 cm mass in the posterior right lower lobe is hypermetabolic, with SUV max of 6.8. A tiny right pleural effusion is seen although no abnormal hypermetabolic activity is seen within the pleural space.  Mild emphysema again noted. Aortic and coronary artery atherosclerosis. Previous CABG. Transvenous pacemaker noted.  ABDOMEN/PELVIS  No abnormal hypermetabolic activity within the liver, pancreas, adrenal glands, or spleen. No hypermetabolic lymph nodes in the abdomen or pelvis. Aortic atherosclerosis.  SKELETON  No focal hypermetabolic activity to suggest skeletal metastasis.  IMPRESSION: 3 cm hypermetabolic mass in posterior right lower lobe,, highly suspicious for bronchogenic carcinoma.  No evidence of thoracic nodal or distant metastatic disease. Tiny right pleural effusion, without  associated hypermetabolic activity.  Mild emphysema.  Aortic atherosclerosis.   Electronically Signed   By: Earle Gell M.D.   On: 10/13/2016 15:02   Pulmonary function test  Order: [id number]  Status:  Edited Result - FINAL Visible to patient:  No (Not Released) Next appt:  12/11/2016 at 10:30 AM in Cardiology ([facility] Device Remotes) Dx:  Other emphysema ([place])    Ref Range & Units 2wk ago 60yrago   FVC-Pre L 3.69  3.48    FVC-%Pred-Pre % 75  78    FVC-Post L 3.73  3.61    FVC-%Pred-Post % 76  81    FVC-%Change-Post % 0  3    FEV1-Pre L 2.19  2.00    FEV1-%Pred-Pre % 59  59    FEV1-Post L 2.47  2.32    FEV1-%Pred-Post % 67  69    FEV1-%Change-Post % 12  16    FEV6-Pre L 3.62  3.26    FEV6-%Pred-Pre % 77  77    FEV6-Post L 3.66  3.56    FEV6-%Pred-Post % 78  84    FEV6-%Change-Post % 1  9    Pre FEV1/FVC ratio % 59  57    FEV1FVC-%Pred-Pre % 79  75    Post FEV1/FVC ratio % 66  64    FEV1FVC-%Change-Post % 11  11    Pre FEV6/FVC Ratio % 98  94    FEV6FVC-%Pred-Pre % 103  98    Post FEV6/FVC ratio % 99  99    FEV6FVC-%Pred-Post % 103  103    FEV6FVC-%Change-Post % 0  5    FEF 25-75 Pre L/sec 1.06  0.79    FEF2575-%Pred-Pre % 35  28    FEF 25-75 Post L/sec 1.50  1.36    FEF2575-%Pred-Post % 50  49    FEF2575-%Change-Post % 41  72    RV L 3.10  2.16    RV % pred % 132  100    TLC L 5.23  5.51    TLC % pred % 72  83    DLCO unc ml/min/mmHg 10.74  10.53    DLCO unc % pred % 31  35    DLCO cor ml/min/mmHg 10.74     DLCO cor % pred % 31     DL/VA ml/min/mmHg/L 2.44  2.37    DL/VA % pred % 52  52   Resulting Agency           Impression:  This 61year old active 2 ppd smoker presents with a spiculated mass in the posterior aspect of the RLL on a lung cancer screening CT that  is new compared to a CT in 06/2013. This lesion is hypermetabolic on PET scan and is suspicious for a bronchogenic carcinoma. There is no other hypermetabolic activity. His PFT's show a  moderate obstructive defect with an FEV1 of 2.19 and a severe diffusion defect of 31% predicted. His oxygen transfer is definitely abnormal with a sat of 94% on room air today at rest.  He is only 60 but has significant comorbidities including obesity, spine and knee DJD, prior CABG and mitral valve repair and prior stroke. I told him that I would consider surgical resection if he quit smoking for a month to allow his respiratory inflammation to improve. In the mean time I think a CT guided needle biopsy is indicated since he is a high risk patient and we want to be sure that this is a cancer. He has had some pleural effusions and lower lobe infiltrates in the past so it is not inconceivable that this could be inflammatory. If he does not quit smoking then he could be treated with XRT and would need the needle biopsy anyway. I reviewed the CT and PET scan images with him and his wife and my recommendations. He knows that he needs to quit smoking and has his wife's encouragement. I answered all of their questions.   Plan:  He will be scheduled for a CT guided needle biopsy and I will see him back after that.   I spent 60 minutes performing this consultation and > 50% of this time was spent face to face counseling and coordinating the care of this patient's right lower lobe lung mass.   [name], MD [facility] [phone]

## 2016-11-02 ENCOUNTER — Other Ambulatory Visit: Payer: Self-pay | Admitting: General Surgery

## 2016-11-02 ENCOUNTER — Other Ambulatory Visit: Payer: Self-pay | Admitting: Radiology

## 2016-11-03 ENCOUNTER — Ambulatory Visit (HOSPITAL_COMMUNITY)
Admission: RE | Admit: 2016-11-03 | Discharge: 2016-11-03 | Disposition: A | Discharge status: Home / Self Care | Payer: Medicare HMO | Source: Ambulatory Visit | Attending: Interventional Radiology | Admitting: Interventional Radiology

## 2016-11-03 ENCOUNTER — Encounter (HOSPITAL_COMMUNITY): Payer: Self-pay

## 2016-11-03 ENCOUNTER — Ambulatory Visit (HOSPITAL_COMMUNITY)
Admission: RE | Admit: 2016-11-03 | Discharge: 2016-11-03 | Disposition: A | Discharge status: Home / Self Care | Payer: Medicare HMO | Source: Ambulatory Visit | Attending: Surgery | Admitting: Surgery

## 2016-11-03 DIAGNOSIS — I429 Cardiomyopathy, unspecified: Secondary | ICD-10-CM | POA: Insufficient documentation

## 2016-11-03 DIAGNOSIS — C3431 Malignant neoplasm of lower lobe, right bronchus or lung: Secondary | ICD-10-CM | POA: Diagnosis not present

## 2016-11-03 DIAGNOSIS — I679 Cerebrovascular disease, unspecified: Secondary | ICD-10-CM | POA: Diagnosis not present

## 2016-11-03 DIAGNOSIS — I2510 Atherosclerotic heart disease of native coronary artery without angina pectoris: Secondary | ICD-10-CM | POA: Diagnosis not present

## 2016-11-03 DIAGNOSIS — Z9981 Dependence on supplemental oxygen: Secondary | ICD-10-CM | POA: Diagnosis not present

## 2016-11-03 DIAGNOSIS — I700 Atherosclerosis of aorta: Secondary | ICD-10-CM | POA: Diagnosis not present

## 2016-11-03 DIAGNOSIS — Z951 Presence of aortocoronary bypass graft: Secondary | ICD-10-CM | POA: Diagnosis not present

## 2016-11-03 DIAGNOSIS — J440 Chronic obstructive pulmonary disease with acute lower respiratory infection: Secondary | ICD-10-CM | POA: Diagnosis not present

## 2016-11-03 DIAGNOSIS — R918 Other nonspecific abnormal finding of lung field: Secondary | ICD-10-CM | POA: Diagnosis not present

## 2016-11-03 DIAGNOSIS — F17210 Nicotine dependence, cigarettes, uncomplicated: Secondary | ICD-10-CM | POA: Insufficient documentation

## 2016-11-03 DIAGNOSIS — F329 Major depressive disorder, single episode, unspecified: Secondary | ICD-10-CM | POA: Diagnosis not present

## 2016-11-03 DIAGNOSIS — R911 Solitary pulmonary nodule: Secondary | ICD-10-CM | POA: Insufficient documentation

## 2016-11-03 DIAGNOSIS — Z8249 Family history of ischemic heart disease and other diseases of the circulatory system: Secondary | ICD-10-CM | POA: Diagnosis not present

## 2016-11-03 DIAGNOSIS — Z79899 Other long term (current) drug therapy: Secondary | ICD-10-CM | POA: Insufficient documentation

## 2016-11-03 DIAGNOSIS — Z95810 Presence of automatic (implantable) cardiac defibrillator: Secondary | ICD-10-CM | POA: Diagnosis not present

## 2016-11-03 DIAGNOSIS — Z888 Allergy status to other drugs, medicaments and biological substances status: Secondary | ICD-10-CM | POA: Insufficient documentation

## 2016-11-03 DIAGNOSIS — E785 Hyperlipidemia, unspecified: Secondary | ICD-10-CM | POA: Diagnosis not present

## 2016-11-03 DIAGNOSIS — Z8673 Personal history of transient ischemic attack (TIA), and cerebral infarction without residual deficits: Secondary | ICD-10-CM | POA: Insufficient documentation

## 2016-11-03 DIAGNOSIS — I509 Heart failure, unspecified: Secondary | ICD-10-CM | POA: Insufficient documentation

## 2016-11-03 DIAGNOSIS — Z7982 Long term (current) use of aspirin: Secondary | ICD-10-CM | POA: Diagnosis not present

## 2016-11-03 DIAGNOSIS — Z9889 Other specified postprocedural states: Secondary | ICD-10-CM | POA: Insufficient documentation

## 2016-11-03 DIAGNOSIS — I252 Old myocardial infarction: Secondary | ICD-10-CM | POA: Diagnosis not present

## 2016-11-03 LAB — CBC
HEMATOCRIT: 39.9 % (ref 39.0–52.0)
Hemoglobin: 13.8 g/dL (ref 13.0–17.0)
MCH: 31.3 pg (ref 26.0–34.0)
MCHC: 34.6 g/dL (ref 30.0–36.0)
MCV: 90.5 fL (ref 78.0–100.0)
Platelets: 259 10*3/uL (ref 150–400)
RBC: 4.41 MIL/uL (ref 4.22–5.81)
RDW: 13.8 % (ref 11.5–15.5)
WBC: 12.7 10*3/uL — ABNORMAL HIGH (ref 4.0–10.5)

## 2016-11-03 LAB — APTT: APTT: 27 s (ref 24–36)

## 2016-11-03 LAB — PROTIME-INR
INR: 1.01
PROTHROMBIN TIME: 13.3 s (ref 11.4–15.2)

## 2016-11-03 MED ORDER — LIDOCAINE HCL 1 % IJ SOLN
INTRAMUSCULAR | Status: AC
  Filled 2016-11-03: qty 20

## 2016-11-03 MED ORDER — MIDAZOLAM HCL 2 MG/2ML IJ SOLN
INTRAMUSCULAR | Status: AC
  Filled 2016-11-03: qty 4

## 2016-11-03 MED ORDER — MIDAZOLAM HCL 2 MG/2ML IJ SOLN
INTRAMUSCULAR | Status: AC | PRN
  Administered 2016-11-03 (×2): 1 mg via INTRAVENOUS

## 2016-11-03 MED ORDER — FENTANYL CITRATE (PF) 100 MCG/2ML IJ SOLN
INTRAMUSCULAR | Status: AC
  Filled 2016-11-03: qty 4

## 2016-11-03 MED ORDER — FENTANYL CITRATE (PF) 100 MCG/2ML IJ SOLN
INTRAMUSCULAR | Status: AC | PRN
  Administered 2016-11-03 (×2): 50 ug via INTRAVENOUS

## 2016-11-03 MED ORDER — SODIUM CHLORIDE 0.9 % IV SOLN: INTRAVENOUS | Status: DC

## 2016-11-03 NOTE — Sedation Documentation (Signed)
Patient denies pain and is resting comfortably.  

## 2016-11-03 NOTE — Discharge Instructions (Signed)
Needle Biopsy of the Lung, Care After °This sheet gives you information about how to care for yourself after your procedure. Your health care provider may also give you more specific instructions. If you have problems or questions, contact your health care provider. °What can I expect after the procedure? °After the procedure, it is common to have: °· Soreness, pain, and tenderness where a tissue sample was taken (biopsy site). °· A cough. °· A sore throat. ° °Follow these instructions at home: °Biopsy site care °· Follow instructions from your health care provider about when to remove the bandage that was placed on the biopsy site. °· Keep the bandage dry until it has been removed. °· Check your biopsy site every day for signs of infection. Check for: °? More redness, swelling, or pain. °? More fluid or blood. °? Warmth to the touch. °? Pus or a bad smell. °General instructions °· Rest as directed by your health care provider. Ask your health care provider what activities are safe for you. °· Do not take baths, swim, or use a hot tub until your health care provider approves. °· Take over-the-counter and prescription medicines only as told by your health care provider. °· If you have airplane travel scheduled, talk with your health care provider about when it is safe for you to travel by airplane. °· It is up to you to get the results of your procedure. Ask your health care provider, or the department that is doing the procedure, when your results will be ready. °· Keep all follow-up visits as told by your health care provider. This is important. °Contact a health care provider if: °· You have more redness, swelling, or pain around your biopsy site. °· You have more fluid or blood coming from your biopsy site. °· Your biopsy site feels warm to the touch. °· You have pus or a bad smell coming from your biopsy site. °· You have a fever. °· You have pain that does not get better with medicine. °Get help right away  if: °· You have problems breathing. °· You have chest pain. °· You cough up blood. °· You faint. °· You have a fast heart rate. °Summary °· After a needle biopsy of the lung, it is common to have a cough, a sore throat, or soreness, pain, and tenderness where a tissue sample was taken (biopsy site). °· You should check your biopsy area every day for signs of infection, including pus or a bad smell, warmth, more fluid or blood, or more redness, swelling, or pain. °· You should not take baths, swim, or use a hot tub until your health care provider approves. °· It is up to you to get the results of your procedure. Ask your health care provider, or the department that is doing the procedure, when your results will be ready. °This information is not intended to replace advice given to you by your health care provider. Make sure you discuss any questions you have with your health care provider. °Document Released: 04/09/2007 Document Revised: 05/03/2016 Document Reviewed: 05/03/2016 °Elsevier Interactive Patient Education © 2017 [employer] ° ° °

## 2016-11-03 NOTE — Sedation Documentation (Signed)
Patient is resting comfortably. 

## 2016-11-03 NOTE — H&P (Signed)
Chief Complaint: Patient was seen in consultation today for right lung mass biopsy at the request of [name]  Referring Physician(s): [name]  Supervising Physician: Jacqulynn Cadet  Patient Status: [facility] - Out-pt  History of Present Illness: IMIR BRUMBACH is a 61 y.o. male   Hx ++ smoker Hx CABG x 6; Mitral valve repair 2013 Pt was seen for a cancer screening CT 09/29/16: IMPRESSION: 1. Spiculated nodular consolidation in the right lower lobe is new from 07/25/2013. Lung-RADS Category 4B, suspicious. Additional imaging evaluation or consultation with pulmonary medicine or thoracic surgery recommended. These results will be called to the ordering clinician or representative by the Radiologist Assistant, and communication documented in the PACS or zVision Dashboard. 2.  Aortic atherosclerosis (ICD10-170.0). 3. Emphysema (ICD10-J43.9). Superimposed mild interstitial lung disease, indicative of nonspecific interstitial pneumonitis.  PET 4/20:IMPRESSION: 1. Spiculated nodular consolidation in the right lower lobe is new from 07/25/2013. Lung-RADS Category 4B, suspicious. Additional imaging evaluation or consultation with pulmonary medicine or thoracic surgery recommended. These results will be called to the ordering clinician or representative by the Radiologist Assistant, and communication documented in the PACS or zVision Dashboard. 2.  Aortic atherosclerosis (ICD10-170.0). 3. Emphysema (ICD10-J43.9). Superimposed mild interstitial lung disease, indicative of nonspecific interstitial pneumonitis.  Request now from Dr Cyndia Bent for biopsy of right lung mass  Past Medical History:  Diagnosis Date  . 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 Arthritis    "back; knees" (06/14/2012)  . Cerebrovascular disease    h/o CVA in 07/2011 + left carotid endarterectomy  . CHF (congestive heart failure) ([place])   .  Cholelithiasis 07/05/2012   Asymptomatic; identified on CT scanning of the chest;  . Chronic lower back pain   . COPD (chronic obstructive pulmonary disease) ([place])    on 2L O2 at home since 08/2011  . Coronary artery disease   . DDD (degenerative disc disease), lumbosacral   . Depression 09/2011   "after OHS" (06/14/2012)  . Hyperlipemia   . ICD (implantable cardiac defibrillator) in place   . Myocardial infarction ([place]) 08/2011  . Other primary cardiomyopathies   . Pneumonia   . Stroke [facility]) 07/2011   denies residual (06/14/2012)    Past Surgical History:  Procedure Laterality Date  . BACK SURGERY    . CARDIAC CATHETERIZATION  09/2011  . CARDIAC DEFIBRILLATOR PLACEMENT  06/14/2012  . CAROTID ENDARTERECTOMY  07/2011   "left" (06/14/2012)  . COLONOSCOPY N/A 10/16/2014   Procedure: COLONOSCOPY;  Surgeon: Daneil Dolin, MD;  [facility];  Service: Endoscopy;  Laterality: N/A;  8:00 AM  . CORONARY ARTERY BYPASS GRAFT  10/20/2011   Procedure: CORONARY ARTERY BYPASS GRAFTING (CABG);  Surgeon: [name], MD;  Location: [place];  Service: Open Heart Surgery;  Laterality: N/A;  CABG x six;  using left internal mammary artery and right leg greater saphenous vein harvested endoscopically  . FOREIGN BODY REMOVAL  06/05/2011   Procedure: FOREIGN BODY REMOVAL ADULT;  Surgeon: Hermelinda Dellen;  Location: [place];  Service: Plastics;  Laterality: Right;  removal foreign body from right side face   . IMPLANTABLE CARDIOVERTER DEFIBRILLATOR IMPLANT N/A 06/14/2012   Procedure: IMPLANTABLE CARDIOVERTER DEFIBRILLATOR IMPLANT;  Surgeon: Evans Lance, MD;  [facility] C[facility] LAB;  Service: Cardiovascular;  Laterality: N/A;  . KNEE ARTHROSCOPY  2002   "right" (06/14/2012)  . LEFT AND RIGHT HEART CATHETERIZATION WITH CORONARY ANGIOGRAM N/A 10/16/2011  Procedure: LEFT AND RIGHT HEART CATHETERIZATION WITH CORONARY ANGIOGRAM;  Surgeon: Jolaine Artist, MD;  Location: [facility] CATH  LAB;  Service: Cardiovascular;  Laterality: N/A;  . LUMBAR [place] SURGERY  2012  . MITRAL VALVE REPAIR  10/20/2011   Procedure: MITRAL VALVE REPAIR (MVR);  Surgeon: [name], MD;  Location: [place];  Service: Open Heart Surgery;  Laterality: N/A;  . POSTERIOR LUMBAR FUSION  1996  . TEE WITHOUT CARDIOVERSION  10/17/2011   Procedure: TRANSESOPHAGEAL ECHOCARDIOGRAM (TEE);  Surgeon: Jolaine Artist, MD;  Location: [facility] ENDOSCOPY;  Service: Cardiovascular;  Laterality: N/A;    Allergies: Metoprolol; Peach flavor; and Morphine and related  Medications: Prior to Admission medications   Medication Sig Start Date End Date Taking? Authorizing Provider  aspirin 81 MG tablet Take 81 mg by mouth daily.   Yes [provider]  atorvastatin (LIPITOR) 80 MG tablet TAKE ONE (1) TABLET EACH DAY 09/12/16  Yes Branch, Alphonse Guild, MD  carvedilol (COREG) 12.5 MG tablet Take 12.5 mg am and 25 mg ( 2 tablets) in the pm Patient taking differently: Take 12.5-25 mg by mouth See admin instructions. Take 12.5 mg am and 25 mg ( 2 tablets) in the pm 07/07/16  Yes Branch, Alphonse Guild, MD  EPIPEN 2-PAK 0.3 MG/0.3ML SOAJ injection Inject 0.3 mg as directed once.  05/19/13  Yes [provider]  escitalopram (LEXAPRO) 20 MG tablet Take 20 mg by mouth daily.   Yes [provider]  furosemide (LASIX) 20 MG tablet TAKE ONE-HALF TABLET DAILY 08/25/16  Yes Branch, Alphonse Guild, MD  HYDROcodone-acetaminophen (NORCO) 10-325 MG per tablet Take 1 tablet by mouth every 8 (eight) hours as needed. Pain Patient taking differently: Take 1 tablet by mouth every 8 (eight) hours as needed for moderate pain. Pain 11/03/11  Yes Lendon Colonel, NP  levocetirizine (XYZAL) 5 MG tablet Take 5 mg by mouth at bedtime as needed for allergies.   Yes [provider]  lisinopril (PRINIVIL,ZESTRIL) 5 MG tablet Take 1 tablet (5 mg total) by mouth daily. 07/07/16  Yes BranchAlphonse Guild, MD  meloxicam (MOBIC) 15 MG tablet  Take 15 mg by mouth daily.   Yes [provider]  spironolactone (ALDACTONE) 25 MG tablet TAKE 1/2 TABLET ONCE DAILY 08/25/16  Yes Branch, Alphonse Guild, MD  tiotropium (SPIRIVA HANDIHALER) 18 MCG inhalation capsule Place 1 capsule (18 mcg total) into inhaler and inhale daily. 09/07/15  Yes BranchAlphonse Guild, MD     Family History  Problem Relation Age of Onset  . Coronary artery disease Mother   . Coronary artery disease Father   . Arrhythmia Brother     Social History   Social History  . Marital status: Married    Spouse name: N/A  . Number of children: N/A  . Years of education: N/A   Social History Main Topics  . Smoking status: Current Every Day Smoker    Packs/day: 2.00    Years: 46.00    Types: Cigarettes    Start date: 12/12/1969  . Smokeless tobacco: Never Used     Comment: quit from 09-10-2011 and started back smoking 03-26-2012  . 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 Drug use: No  . Sexual activity: No     Comment: Quit drinking alcohol 2 yrs ago   Other Topics Concern  . 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    Review of Systems: A 12 point ROS discussed and pertinent positives are indicated in the HPI above.  All other systems are negative.  Review of Systems  Constitutional: Negative for activity change, fatigue, fever and unexpected weight change.  Respiratory: Positive for cough. Negative for choking and chest tightness.   Neurological: Negative for weakness.  Psychiatric/Behavioral: Negative for behavioral problems and confusion.    Vital Signs: BP 106/73   Pulse 69   Temp 98.9 F (37.2 C)   Resp 18   Ht '5\' 11"'$  (1.803 m)   Wt 211 lb (95.7 kg)   SpO2 94%   BMI 29.43 kg/m   Physical Exam  Constitutional: He is oriented to person, place, and time.  Cardiovascular: Normal rate, regular rhythm and normal heart sounds.     Pulmonary/Chest: Effort normal and breath sounds normal.  Abdominal: Soft. Bowel sounds are normal.  Musculoskeletal: Normal range of motion.  Neurological: He is alert and oriented to person, place, and time.  Skin: Skin is warm and dry.  Psychiatric: He has a normal mood and affect. His behavior is normal. Judgment and thought content normal.  Nursing note and vitals reviewed.   Mallampati Score:  MD Evaluation Airway: WNL Heart: WNL Abdomen: WNL Chest/ Lungs: WNL ASA  Classification: 3 Mallampati/Airway Score: One  Imaging: Nm Pet Image Initial (pi) Skull Base To Thigh  Result Date: 10/13/2016 CLINICAL DATA:  Initial treatment strategy for right lower lobe pulmonary nodule. EXAM: NUCLEAR MEDICINE PET SKULL BASE TO THIGH TECHNIQUE: 10.3 mCi F-18 FDG was injected intravenously. Full-ring PET imaging was performed from the skull base to thigh after the radiotracer. CT data was obtained and used for attenuation correction and anatomic localization. FASTING BLOOD GLUCOSE:  Value: 170 mg/dl COMPARISON:  Chest CT on 09/29/2016 FINDINGS: NECK No hypermetabolic lymph nodes in the neck. CHEST No hypermetabolic mediastinal, hilar, or axillary nodes. An ill-defined 3.0 x 1.6 cm mass in the posterior right lower lobe is hypermetabolic, with SUV max of 6.8. A tiny right pleural effusion is seen although no abnormal hypermetabolic activity is seen within the pleural space. Mild emphysema again noted. Aortic and coronary artery atherosclerosis. Previous CABG. Transvenous pacemaker noted. ABDOMEN/PELVIS No abnormal hypermetabolic activity within the liver, pancreas, adrenal glands, or spleen. No hypermetabolic lymph nodes in the abdomen or pelvis. Aortic atherosclerosis. SKELETON No focal hypermetabolic activity to suggest skeletal metastasis. IMPRESSION: 3 cm hypermetabolic mass in posterior right lower lobe,, highly suspicious for bronchogenic carcinoma. No evidence of thoracic nodal or distant  metastatic disease. Tiny right pleural effusion, without associated hypermetabolic activity. Mild emphysema.  Aortic atherosclerosis. Electronically Signed   By: Earle Gell M.D.   On: 10/13/2016 15:02    Labs:  CBC: No results for input(s): WBC, HGB, HCT, PLT in the last 8760 hours.  COAGS: No results for input(s): INR, APTT in the last 8760 hours.  BMP: No results for input(s): NA, K, CL, CO2, GLUCOSE, BUN, CALCIUM, CREATININE, GFRNONAA, GFRAA in the last 8760 hours.  Invalid input(s): CMP  LIVER FUNCTION TESTS: No results for input(s): BILITOT, AST, ALT, ALKPHOS, PROT, ALBUMIN in the last 8760 hours.  TUMOR MARKERS: No results for input(s): AFPTM, CEA, CA199, CHROMGRNA in the last 8760 hours.  Assessment and Plan:  Right lung mass +PET ++ smoker Scheduled for right lung mass bx Risks and Benefits discussed with the patient including, but not limited to bleeding, hemoptysis, respiratory failure requiring intubation, infection, pneumothorax requiring  chest tube placement, stroke from air embolism or even death. All of the patient's questions were answered, patient is agreeable to proceed. Consent signed and in chart.  Thank you for this interesting consult.  I greatly enjoyed meeting CHAPIN ARDUINI and look forward to participating in their care.  A copy of this report was sent to the requesting provider on this date.  Electronically Signed: Hena Ewalt A 11/03/2016, 10:09 AM   I spent a total of  30 Minutes   in face to face in clinical consultation, greater than 50% of which was counseling/coordinating care for right lung mass bx

## 2016-11-03 NOTE — Procedures (Signed)
Interventional Radiology Procedure Note  Procedure: CT guided biopsy of RLL pulmonary nodule Complications: No immediate Recommendations: - Bedrest until CXR cleared.  Minimize talking, coughing or otherwise straining.  - Follow up 2 hr CXR pending   Signed,  Malaina Mortellaro K. Kelechi Orgeron, MD   

## 2016-11-03 NOTE — Sedation Documentation (Signed)
Doctor at bedside.

## 2016-11-08 ENCOUNTER — Ambulatory Visit (INDEPENDENT_AMBULATORY_CARE_PROVIDER_SITE_OTHER): Payer: Medicare HMO | Admitting: Surgery

## 2016-11-08 ENCOUNTER — Encounter: Payer: Self-pay | Admitting: Surgery

## 2016-11-08 VITALS — BP 89/56 | HR 65 | Resp 16 | Ht 71.0 in | Wt 211.0 lb

## 2016-11-08 DIAGNOSIS — C3431 Malignant neoplasm of lower lobe, right bronchus or lung: Secondary | ICD-10-CM | POA: Diagnosis not present

## 2016-11-08 DIAGNOSIS — R911 Solitary pulmonary nodule: Secondary | ICD-10-CM

## 2016-11-08 NOTE — Progress Notes (Signed)
HPI:  Charles Savage returns today to discuss the results of his recent CT guided needle biopsy and decide on further treatment plans. The needle biopsy of the RLL lung mass showed non-small cell lung cancer. He says that he has continued to smoke since I last saw him on 5/2. He has cut down from smoking 2-3 ppd to 3 packs over 2 weeks.   Current Outpatient Prescriptions  Medication Sig Dispense Refill  . aspirin 81 MG tablet Take 81 mg by mouth daily.    Marland Kitchen atorvastatin (LIPITOR) 80 MG tablet TAKE ONE (1) TABLET EACH DAY 90 tablet 3  . carvedilol (COREG) 12.5 MG tablet Take 12.5 mg am and 25 mg ( 2 tablets) in the pm (Patient taking differently: Take 12.5-25 mg by mouth See admin instructions. Take 12.5 mg am and 25 mg ( 2 tablets) in the pm) 270 tablet 3  . EPIPEN 2-PAK 0.3 MG/0.3ML SOAJ injection Inject 0.3 mg as directed once.     . escitalopram (LEXAPRO) 20 MG tablet Take 20 mg by mouth daily.    . furosemide (LASIX) 20 MG tablet TAKE ONE-HALF TABLET DAILY 45 tablet 3  . HYDROcodone-acetaminophen (NORCO) 10-325 MG per tablet Take 1 tablet by mouth every 8 (eight) hours as needed. Pain (Patient taking differently: Take 1 tablet by mouth every 8 (eight) hours as needed for moderate pain. Pain) 21 tablet 0  . levocetirizine (XYZAL) 5 MG tablet Take 5 mg by mouth at bedtime as needed for allergies.    Marland Kitchen lisinopril (PRINIVIL,ZESTRIL) 5 MG tablet Take 1 tablet (5 mg total) by mouth daily. 90 tablet 3  . meloxicam (MOBIC) 15 MG tablet Take 15 mg by mouth daily.    Marland Kitchen spironolactone (ALDACTONE) 25 MG tablet TAKE 1/2 TABLET ONCE DAILY 90 tablet 3  . tiotropium (SPIRIVA HANDIHALER) 18 MCG inhalation capsule Place 1 capsule (18 mcg total) into inhaler and inhale daily. 30 capsule 12   No current facility-administered medications for this visit.      Physical Exam: BP (!) 89/56 (BP Location: Right Arm, Patient Position: Sitting, Cuff Size: Large)   Pulse 65   Resp 16   Ht '5\' 11"'$  (1.803 m)    Wt 211 lb (95.7 kg)   SpO2 91% Comment: ON RA  BMI 29.43 kg/m  He looks comfortable Lungs are clear with decreased breath sounds throughout.  Diagnostic Tests:  FINAL DIAGNOSIS Diagnosis Lung, needle/core biopsy(ies), Right Lower Lobe - NON-SMALL CELL CARCINOMA. - SEE COMMENT. Microscopic Comment There is a microscopic focus of non-small cell carcinoma. The tumor cells are positive for cytokeratin 5/6 and TTF-1. They appear negative for Napsin. This profile is nonspecific. Although based on the histologic features, I slightly favor adenocarcinoma although the phenotype is best determined at the time of final excision. There is likely insufficient tumor present for additional studies if requested. Dr. Thressa Savage has reviewed the case and concurs with the diagnosis of non small cell carcinoma. Dr. Cyndia Savage was paged on 11/07/2016. (Charles Savage:kh 11/07/16) Charles Cutter MD Pathologist, Electronic Signature (Case signed 11/07/2016) Specimen Gross and Clinical Information  Impression:  He has a NSCLC of the RLL of the lung. He is an active smoker with a moderate obstructive defect and severe diffusion defect on PFT's and low resting oxygen sats. He is only 52 but has significant comorbidities including obesity, spine and knee DJD, prior CABG and mitral valve repair and prior stroke. When I saw him two weeks ago I told him that I  would consider operating on him if he could quit smoking for a month to allow his lungs to recover. He has cut down but continues to smoke a significant amount and does not plan to quit. Therefore I don't think surgical resection is an option since he is a marginal candidate even if he quit smoking. I discussed surgical resection with him and his wife and the risks involved for him and he does not want to have surgical resection done anyway.  I don't think it is worth the risk operating on this gentleman and he should be considered for XRT. He is likely to have recurrent or second  primary lung cancer if he continues to smoke. He and his wife are in agreement with referral to radiation oncology.  Plan:  I will set up a consultation with Dr. Ledon Savage for consideration of XRT.   I spent 15 minutes performing this established patient evaluation and > 50% of this time was spent face to face counseling and coordinating the care of this patient's non-small cell lung cancer.   Charles Pollack, MD [facility] [phone]

## 2016-11-09 ENCOUNTER — Encounter: Payer: Self-pay | Admitting: Radiation Oncology

## 2016-11-09 NOTE — Progress Notes (Signed)
Thoracic Location of Tumor / Histology: Lung, Right Lower Lobe  Patient presented on 09/29/2016 for Lung Cancer Screening due to history of smoking 2-3 ppd by Eric Form, NP ([place]), patient referred to Dr. Gilford Raid.    Biopsies of Lung (if applicable) revealed:    11/03/2016      Tobacco/Marijuana/Snuff/ETOH use: currently smoking 2 cigarettes per day.  Has smoked 2 ppd for 46 years.  Past/Anticipated interventions by cardiothoracic surgery, if any: Not a surgical candiate, as patient is not interested in quitting smoking and continues to smoke a significant amount.  Patient also not interested in surgery per Dr. Vivi Martens note from 11/08/2016  Past/Anticipated interventions by medical oncology, if any: none  Signs/Symptoms  Weight changes, if any: no  Respiratory complaints, if any: dry cough, occasional shortness of breath  Hemoptysis, if any: no  Pain issues, if any:  Yes - has chronic lower back paiin and arthritis in his hips.  He takes hydrocodone/acetaminophen usually twice a day.  SAFETY ISSUES:  Prior radiation? no  Pacemaker/ICD? Yes 06/14/2012  Possible current pregnancy? no  Is the patient on methotrexate? 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   He'll need lung SBRT simulation, but, the sim schedule tomorrow looks full.  Since he is [employer], we may need time for pre-cert before sim, so, we can start looking at possible SBRT sim next Firday 5/25.  BP (!) 117/59 (BP Location: Left Arm, Patient Position: Sitting)   Pulse 67   Temp 98.3 F (36.8 C) (Oral)   Ht '5\' 11"'$  (1.803 m)   Wt 212 lb 12.8 oz (96.5 kg)   SpO2 93%   BMI 29.68 kg/m    Wt Readings from Last 3 Encounters:  11/10/16 212 lb 12.8 oz (96.5 kg)  11/08/16 211 lb (95.7 kg)  11/03/16 211 lb (95.7 kg)

## 2016-11-10 ENCOUNTER — Ambulatory Visit
Admission: RE | Admit: 2016-11-10 | Discharge: 2016-11-10 | Disposition: A | Discharge status: Home / Self Care | Payer: Medicare HMO | Source: Ambulatory Visit | Attending: Radiation Oncology | Admitting: Radiation Oncology

## 2016-11-10 ENCOUNTER — Encounter: Payer: Self-pay | Admitting: Radiation Oncology

## 2016-11-10 DIAGNOSIS — Z888 Allergy status to other drugs, medicaments and biological substances status: Secondary | ICD-10-CM | POA: Diagnosis not present

## 2016-11-10 DIAGNOSIS — F17210 Nicotine dependence, cigarettes, uncomplicated: Secondary | ICD-10-CM | POA: Diagnosis not present

## 2016-11-10 DIAGNOSIS — Z8673 Personal history of transient ischemic attack (TIA), and cerebral infarction without residual deficits: Secondary | ICD-10-CM | POA: Insufficient documentation

## 2016-11-10 DIAGNOSIS — Z951 Presence of aortocoronary bypass graft: Secondary | ICD-10-CM | POA: Insufficient documentation

## 2016-11-10 DIAGNOSIS — E785 Hyperlipidemia, unspecified: Secondary | ICD-10-CM | POA: Insufficient documentation

## 2016-11-10 DIAGNOSIS — I252 Old myocardial infarction: Secondary | ICD-10-CM | POA: Insufficient documentation

## 2016-11-10 DIAGNOSIS — C3431 Malignant neoplasm of lower lobe, right bronchus or lung: Secondary | ICD-10-CM | POA: Diagnosis not present

## 2016-11-10 DIAGNOSIS — Z9981 Dependence on supplemental oxygen: Secondary | ICD-10-CM | POA: Diagnosis not present

## 2016-11-10 DIAGNOSIS — Z885 Allergy status to narcotic agent status: Secondary | ICD-10-CM | POA: Insufficient documentation

## 2016-11-10 DIAGNOSIS — Z7982 Long term (current) use of aspirin: Secondary | ICD-10-CM | POA: Insufficient documentation

## 2016-11-10 DIAGNOSIS — Z95810 Presence of automatic (implantable) cardiac defibrillator: Secondary | ICD-10-CM | POA: Insufficient documentation

## 2016-11-10 DIAGNOSIS — Z510 Encounter for antineoplastic radiation therapy: Secondary | ICD-10-CM | POA: Diagnosis not present

## 2016-11-10 DIAGNOSIS — Z8249 Family history of ischemic heart disease and other diseases of the circulatory system: Secondary | ICD-10-CM | POA: Insufficient documentation

## 2016-11-10 DIAGNOSIS — I2510 Atherosclerotic heart disease of native coronary artery without angina pectoris: Secondary | ICD-10-CM | POA: Insufficient documentation

## 2016-11-10 DIAGNOSIS — I428 Other cardiomyopathies: Secondary | ICD-10-CM | POA: Diagnosis not present

## 2016-11-10 DIAGNOSIS — J449 Chronic obstructive pulmonary disease, unspecified: Secondary | ICD-10-CM | POA: Insufficient documentation

## 2016-11-10 DIAGNOSIS — Z79899 Other long term (current) drug therapy: Secondary | ICD-10-CM | POA: Insufficient documentation

## 2016-11-10 DIAGNOSIS — I509 Heart failure, unspecified: Secondary | ICD-10-CM | POA: Insufficient documentation

## 2016-11-10 DIAGNOSIS — I679 Cerebrovascular disease, unspecified: Secondary | ICD-10-CM | POA: Diagnosis not present

## 2016-11-10 NOTE — Progress Notes (Signed)
Radiation Oncology         [phone] ________________________________  Initial outpatient Consultation  Name: CASSEY HURRELL MRN: [id number]  Date: 11/10/2016  DOB: 28-May-1956  XB:[id number], Jenny Reichmann, MD  [name], MD   REFERRING PHYSICIAN: [name], MD  DIAGNOSIS: 61 y.o. gentleman with Stage IA, cT1bN0M0, NSCLC, favor adenocarcinoma of the right lower lobe    ICD-9-CM ICD-10-CM   1. Primary adenocarcinoma of lower lobe of right lung (HCC) 162.5 C34.31     HISTORY OF PRESENT ILLNESS: COLUMBUS ICE is a 61 y.o. male seen at the request of Dr. Cyndia Bent for a new diagnosis of lung cancer. The patient was seen in the lung cancer screening clinic and was found on 09/29/16 to have a spiculated area of nodular consolidation in the posterior aspect of the right lower lobe with adjacent pleural thickening. A PET scan on 10/13/16 revealed a 3 x 1.6 cm mass in the posterior right lower lobe with an SUV of 6.8. A tiny right pleural effusion was also seen without hypermetabolism. He underwent a CT guided biopsy on 11/03/16 which revealed a NSCLC, favor adenocarcinoma. He was planning surgical resection however was not willing to stop smoking in order to have this performed, and wanted to consider alternative treatments. He comes today to discus SBRT.      PREVIOUS RADIATION THERAPY: No  PAST MEDICAL HISTORY:  Past Medical History:  Diagnosis Date  . 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 Arthritis    "back; knees" (06/14/2012)  . Cerebrovascular disease    h/o CVA in 07/2011 + left carotid endarterectomy  . CHF (congestive heart failure) ([place])   . Cholelithiasis 07/05/2012   Asymptomatic; identified on CT scanning of the chest;  . Chronic lower back pain   . COPD (chronic obstructive pulmonary disease) ([place])    on 2L O2 at home since 08/2011  . Coronary artery disease   . DDD (degenerative disc disease), lumbosacral   .  Depression 09/2011   "after OHS" (06/14/2012)  . Hyperlipemia   . ICD (implantable cardiac defibrillator) in place   . Myocardial infarction ([place]) 08/2011  . Other primary cardiomyopathies   . Pneumonia   . Stroke [facility]) 07/2011   denies residual (06/14/2012)      PAST SURGICAL HISTORY: Past Surgical History:  Procedure Laterality Date  . BACK SURGERY    . CARDIAC CATHETERIZATION  09/2011  . CARDIAC DEFIBRILLATOR PLACEMENT  06/14/2012  . CAROTID ENDARTERECTOMY  07/2011   "left" (06/14/2012)  . COLONOSCOPY N/A 10/16/2014   Procedure: COLONOSCOPY;  Surgeon: Daneil Dolin, MD;  [facility];  Service: Endoscopy;  Laterality: N/A;  8:00 AM  . CORONARY ARTERY BYPASS GRAFT  10/20/2011   Procedure: CORONARY ARTERY BYPASS GRAFTING (CABG);  Surgeon: [name], MD;  Location: [facility];  Service: Open Heart Surgery;  Laterality: N/A;  CABG x six;  using left internal mammary artery and right leg greater saphenous vein harvested endoscopically  . FOREIGN BODY REMOVAL  06/05/2011   Procedure: FOREIGN BODY REMOVAL ADULT;  Surgeon: Hermelinda Dellen;  Location: [facility];  Service: Plastics;  Laterality: Right;  removal foreign body from right side face   . IMPLANTABLE CARDIOVERTER DEFIBRILLATOR IMPLANT N/A 06/14/2012   Procedure: IMPLANTABLE CARDIOVERTER DEFIBRILLATOR IMPLANT;  Surgeon: Evans Lance, MD;  Location: [facility] CATH LAB;  Service: Cardiovascular;  Laterality: N/A;  . KNEE ARTHROSCOPY  2002   "  right" (06/14/2012)  . LEFT AND RIGHT HEART CATHETERIZATION WITH CORONARY ANGIOGRAM N/A 10/16/2011   Procedure: LEFT AND RIGHT HEART CATHETERIZATION WITH CORONARY ANGIOGRAM;  Surgeon: Jolaine Artist, MD;  Location: [facility] CATH LAB;  Service: Cardiovascular;  Laterality: N/A;  . LUMBAR [place] SURGERY  2012  . MITRAL VALVE REPAIR  10/20/2011   Procedure: MITRAL VALVE REPAIR (MVR);  Surgeon: [name], MD;  Location: [place];  Service: Open Heart Surgery;  Laterality: N/A;  .  POSTERIOR LUMBAR FUSION  1996  . TEE WITHOUT CARDIOVERSION  10/17/2011   Procedure: TRANSESOPHAGEAL ECHOCARDIOGRAM (TEE);  Surgeon: Jolaine Artist, MD;  Location: [facility];  Service: Cardiovascular;  Laterality: N/A;    FAMILY HISTORY:  Family History  Problem Relation Age of Onset  . Coronary artery disease Mother   . Coronary artery disease Father   . Arrhythmia Brother     SOCIAL HISTORY:  Social History   Social History  . Marital status: Married    Spouse name: N/A  . Number of children: N/A  . Years of education: N/A   Occupational History  . Not on file.   Social History Main Topics  . Smoking status: Current Every Day Smoker    Packs/day: 2.00    Years: 46.00    Types: Cigarettes    Start date: 12/12/1969  . Smokeless tobacco: Never Used     Comment: currently smoking 2 cigarettes per day  . 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 Drug use: No  . Sexual activity: No     Comment: Quit drinking alcohol 2 yrs ago   Other Topics Concern  . 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  The patient is married and lives in [place].   ALLERGIES: Metoprolol; Peach flavor; Morphine and related; and Bee venom  MEDICATIONS:  Current Outpatient Prescriptions  Medication Sig Dispense Refill  . aspirin 81 MG tablet Take 81 mg by mouth daily.    Marland Kitchen atorvastatin (LIPITOR) 80 MG tablet TAKE ONE (1) TABLET EACH DAY 90 tablet 3  . carvedilol (COREG) 12.5 MG tablet Take 12.5 mg am and 25 mg ( 2 tablets) in the pm (Patient taking differently: Take 12.5-25 mg by mouth See admin instructions. Take 12.5 mg am and 25 mg ( 2 tablets) in the pm) 270 tablet 3  . EPIPEN 2-PAK 0.3 MG/0.3ML SOAJ injection Inject 0.3 mg as directed once.     . escitalopram (LEXAPRO) 20 MG tablet Take 20 mg by mouth daily.    . furosemide (LASIX) 20 MG tablet TAKE ONE-HALF TABLET DAILY 45  tablet 3  . HYDROcodone-acetaminophen (NORCO) 10-325 MG per tablet Take 1 tablet by mouth every 8 (eight) hours as needed. Pain (Patient taking differently: Take 1 tablet by mouth every 8 (eight) hours as needed for moderate pain. Pain) 21 tablet 0  . levocetirizine (XYZAL) 5 MG tablet Take 5 mg by mouth at bedtime as needed for allergies.    Marland Kitchen lisinopril (PRINIVIL,ZESTRIL) 5 MG tablet Take 1 tablet (5 mg total) by mouth daily. 90 tablet 3  . meloxicam (MOBIC) 15 MG tablet Take 15 mg by mouth daily.    Marland Kitchen spironolactone (ALDACTONE) 25 MG tablet TAKE 1/2 TABLET ONCE DAILY 90 tablet 3  . tiotropium (SPIRIVA HANDIHALER) 18 MCG inhalation capsule Place 1 capsule (18 mcg total) into inhaler  and inhale daily. 30 capsule 12   No current facility-administered medications for this encounter.     REVIEW OF SYSTEMS:  On review of systems, the patient reports that he is doing well overall. He denies any chest pain, shortness of breath,  fevers, chills, night sweats, unintended weight changes. He has a productive cough with white phlegm. He denies hemoptysis. He denies any bowel or bladder disturbances, and denies abdominal pain, nausea or vomiting. He denies any new musculoskeletal or joint aches or pains. A complete review of systems is obtained and is otherwise negative.    PHYSICAL EXAM:  Wt Readings from Last 3 Encounters:  11/10/16 212 lb 12.8 oz (96.5 kg)  11/08/16 211 lb (95.7 kg)  11/03/16 211 lb (95.7 kg)   Temp Readings from Last 3 Encounters:  11/10/16 98.3 F (36.8 C) (Oral)  11/03/16 98 F (36.7 C) (Oral)  10/16/14 98 F (36.7 C) (Oral)   BP Readings from Last 3 Encounters:  11/10/16 (!) 117/59  11/08/16 (!) 89/56  11/03/16 115/66   Pulse Readings from Last 3 Encounters:  11/10/16 67  11/08/16 65  11/03/16 70   Pain Assessment Pain Score: 0-No pain/10  In general this is a well appearing caucasian male in no acute distress. He is alert and oriented x4 and appropriate  throughout the examination. HEENT reveals that the patient is normocephalic, atraumatic. EOMs are intact. PERRLA. Skin is intact without any evidence of gross lesions. Cardiovascular exam reveals a regular rate and rhythm, no clicks rubs or murmurs are auscultated. Chest is clear to auscultation bilaterally. Lymphatic assessment is performed and does not reveal any adenopathy in the cervical, supraclavicular, axillary, or inguinal chains. Abdomen has active bowel sounds in all quadrants and is intact. The abdomen is soft, non tender, non distended. Lower extremities are negative for pretibial pitting edema, deep calf tenderness, cyanosis or clubbing.   KPS = 90  100 - Normal; no complaints; no evidence of disease. 90   - Able to carry on normal activity; minor signs or symptoms of disease. 80   - Normal activity with effort; some signs or symptoms of disease. 53   - Cares for self; unable to carry on normal activity or to do active work. 60   - Requires occasional assistance, but is able to care for most of his personal needs. 50   - Requires considerable assistance and frequent medical care. 60   - Disabled; requires special care and assistance. 2   - Severely disabled; hospital admission is indicated although death not imminent. 21   - Very sick; hospital admission necessary; active supportive treatment necessary. 10   - Moribund; fatal processes progressing rapidly. 0     - Dead  Karnofsky DA, Abelmann Buchanan, Craver LS and Burchenal [facility] [phone]) The use of the nitrogen mustards in the palliative treatment of carcinoma: with particular reference to bronchogenic carcinoma Cancer 1 634-56  LABORATORY DATA:  Lab Results  Component Value Date   WBC 12.7 (H) 11/03/2016   HGB 13.8 11/03/2016   HCT 39.9 11/03/2016   MCV 90.5 11/03/2016   PLT 259 11/03/2016   Lab Results  Component Value Date   NA 134 (L) 04/03/2014   K 4.4 04/03/2014   CL 101 04/03/2014   CO2 23 04/03/2014   Lab Results    Component Value Date   ALT 16 08/15/2013   AST 20 08/15/2013   ALKPHOS 72 08/15/2013   BILITOT 0.4 08/15/2013     RADIOGRAPHY: Nm  Pet Image Initial (pi) Skull Base To Thigh  Result Date: 10/13/2016 CLINICAL DATA:  Initial treatment strategy for right lower lobe pulmonary nodule. EXAM: NUCLEAR MEDICINE PET SKULL BASE TO THIGH TECHNIQUE: 10.3 mCi F-18 FDG was injected intravenously. Full-ring PET imaging was performed from the skull base to thigh after the radiotracer. CT data was obtained and used for attenuation correction and anatomic localization. FASTING BLOOD GLUCOSE:  Value: 170 mg/dl COMPARISON:  Chest CT on 09/29/2016 FINDINGS: NECK No hypermetabolic lymph nodes in the neck. CHEST No hypermetabolic mediastinal, hilar, or axillary nodes. An ill-defined 3.0 x 1.6 cm mass in the posterior right lower lobe is hypermetabolic, with SUV max of 6.8. A tiny right pleural effusion is seen although no abnormal hypermetabolic activity is seen within the pleural space. Mild emphysema again noted. Aortic and coronary artery atherosclerosis. Previous CABG. Transvenous pacemaker noted. ABDOMEN/PELVIS No abnormal hypermetabolic activity within the liver, pancreas, adrenal glands, or spleen. No hypermetabolic lymph nodes in the abdomen or pelvis. Aortic atherosclerosis. SKELETON No focal hypermetabolic activity to suggest skeletal metastasis. IMPRESSION: 3 cm hypermetabolic mass in posterior right lower lobe,, highly suspicious for bronchogenic carcinoma. No evidence of thoracic nodal or distant metastatic disease. Tiny right pleural effusion, without associated hypermetabolic activity. Mild emphysema.  Aortic atherosclerosis. Electronically Signed   By: Earle Gell M.D.   On: 10/13/2016 15:02   Ct Biopsy  Result Date: 11/03/2016 INDICATION: 61 year old male with a hypermetabolic right lower lobe pulmonary nodule. CT biopsy is requested to confirm tissue diagnosis. EXAM: CT-guided biopsy right lower lobe lobe  pulmonary nodule Interventional Radiologist:  Criselda Peaches, MD MEDICATIONS: None. ANESTHESIA/SEDATION: Fentanyl 2 mcg IV; Versed 100 mg IV Moderate Sedation Time:  26 minutes The patient was continuously monitored during the procedure by the interventional radiology nurse under my direct supervision. FLUOROSCOPY TIME:  Fluoroscopy Time: 0 minutes 0 seconds (0 mGy). COMPLICATIONS: None immediate. Estimated blood loss:  0 PROCEDURE: Informed written consent was obtained from the patient after a thorough discussion of the procedural risks, benefits and alternatives. All questions were addressed. Maximal Sterile Barrier Technique was utilized including caps, mask, sterile gowns, sterile gloves, sterile drape, hand hygiene and skin antiseptic. A timeout was performed prior to the initiation of the procedure. A planning axial CT scan was performed. The nodule in the right lower lobe was successfully identified. A suitable skin entry site was selected and marked. The region was then sterilely prepped and draped in standard fashion using Betadine skin prep. Local anesthesia was attained by infiltration with 1% lidocaine. A small dermatotomy was made. Under intermittent CT fluoroscopic guidance, a 17 gauge trocar needle was advanced into the lung and positioned at the margin of the nodule. Multiple 18 gauge core biopsies were then coaxially obtained using the BioPince automated biopsy device. Biopsy specimens were placed in formalin and delivered to pathology for further analysis. The biopsy device and introducer needle were removed. The bio sentry device was employed. Post biopsy axial CT imaging demonstrates no evidence of immediate complication. There is no pneumothorax. Mild perilesional alveolar hemorrhage is not unexpected. The patient tolerated the procedure well. IMPRESSION: Technically successful CT-guided biopsy right lower lobe pulmonary nodule. Electronically Signed   By: Jacqulynn Cadet M.D.   On:  11/03/2016 17:09   Dg Chest Port 1 View  Result Date: 11/03/2016 CLINICAL DATA:  Status post right lower lobe nodule biopsy. EXAM: PORTABLE CHEST 1 VIEW COMPARISON:  CT, 09/29/2016. Chest radiograph, 05/15/2013. Procedure CT, 11/03/2016. FINDINGS: No pneumothorax following right lung biopsy.  Opacity at the right lung base is stable from the recent chest CT reflecting prominent right cardiophrenic angle fat. There are no acute findings in the lungs. Changes from cardiac surgery and valve replacement are stable. Left anterior chest wall pacemaker is stable. IMPRESSION: 1. No pneumothorax or evidence of a procedure complication. Electronically Signed   By: Lajean Manes M.D.   On: 11/03/2016 15:31      IMPRESSION/PLAN: 1. 61 y.o. gentleman with Stage IA, cT1bN0M0, NSCLC, favor adenocarcinoma of the right lower lobe. We discussed the pathology findings and reviews the results of the patient's recent imaging. The consensus from the thoracic conference included options for surgical resection of his early lung cancer versus SBRT radiotherapy. We discussed options of either, and we discussed the delivery and logistics of radiotherapy we would recommend a course of about 5 fractions, and we discussed the risks, benefits, short, and long term effects of therapy. The patient is interested in moving forward and will be scheduled for simulation next Friday, Nov 17, 2016. 2. Tobacco cessation. The patient is counseled on the importance of tobacco cessation. We will continue to discuss this further at subsequent visits.     Carola Rhine, PAC Seen with  _____________________________________  Sheral Apley Tammi Klippel, M.D.

## 2016-11-10 NOTE — Progress Notes (Signed)
Please see the Nurse Progress Note in the MD Initial Consult Encounter for this patient. 

## 2016-11-13 ENCOUNTER — Telehealth: Payer: Self-pay | Admitting: Radiation Oncology

## 2016-11-13 NOTE — Telephone Encounter (Signed)
Faxed [facility] pacemaker/ICD form to Dr. Cristopher Peru at [phone] to complete so the patient can begin radiation therapy. Fax confirmation of delivery obtained.

## 2016-11-16 ENCOUNTER — Telehealth: Payer: Self-pay

## 2016-11-16 NOTE — Addendum Note (Signed)
Encounter addended by: Malena Edman, RN on: 11/16/2016  8:35 AM<BR>    Actions taken: Charge Capture section accepted

## 2016-11-16 NOTE — Telephone Encounter (Signed)
Sent clearance documents to medical records to be faxed to [facility] [phone]. Dr. Tanna Furry checked "no" for all questions.

## 2016-11-17 ENCOUNTER — Ambulatory Visit
Admission: RE | Admit: 2016-11-17 | Discharge: 2016-11-17 | Disposition: A | Discharge status: Home / Self Care | Payer: Medicare HMO | Source: Ambulatory Visit | Attending: Radiation Oncology | Admitting: Radiation Oncology

## 2016-11-17 DIAGNOSIS — C3431 Malignant neoplasm of lower lobe, right bronchus or lung: Secondary | ICD-10-CM

## 2016-11-17 DIAGNOSIS — Z9981 Dependence on supplemental oxygen: Secondary | ICD-10-CM | POA: Diagnosis not present

## 2016-11-17 DIAGNOSIS — J449 Chronic obstructive pulmonary disease, unspecified: Secondary | ICD-10-CM | POA: Diagnosis not present

## 2016-11-17 DIAGNOSIS — Z510 Encounter for antineoplastic radiation therapy: Secondary | ICD-10-CM | POA: Diagnosis not present

## 2016-11-17 DIAGNOSIS — I2510 Atherosclerotic heart disease of native coronary artery without angina pectoris: Secondary | ICD-10-CM | POA: Diagnosis not present

## 2016-11-17 DIAGNOSIS — Z8673 Personal history of transient ischemic attack (TIA), and cerebral infarction without residual deficits: Secondary | ICD-10-CM | POA: Diagnosis not present

## 2016-11-17 DIAGNOSIS — I509 Heart failure, unspecified: Secondary | ICD-10-CM | POA: Diagnosis not present

## 2016-11-17 DIAGNOSIS — F17210 Nicotine dependence, cigarettes, uncomplicated: Secondary | ICD-10-CM | POA: Diagnosis not present

## 2016-11-17 DIAGNOSIS — E785 Hyperlipidemia, unspecified: Secondary | ICD-10-CM | POA: Diagnosis not present

## 2016-11-17 NOTE — Progress Notes (Signed)
  Radiation Oncology         [phone] ________________________________  Name: Charles Savage MRN: [id number]  Date: 11/17/2016  DOB: 12-07-1955  STEREOTACTIC BODY RADIOTHERAPY SIMULATION AND TREATMENT PLANNING NOTE    ICD-9-CM ICD-10-CM   1. Primary adenocarcinoma of lower lobe of right lung (HCC) 162.5 C34.31     DIAGNOSIS:  61 y.o. gentleman with Stage IA, cT1bN0M0, NSCLC, favor adenocarcinoma of the right lower lobe  NARRATIVE:  The patient was brought to the [facility].  Identity was confirmed.  All relevant records and images related to the planned course of therapy were reviewed.  The patient freely provided informed written consent to proceed with treatment after reviewing the details related to the planned course of therapy. The consent form was witnessed and verified by the simulation staff.  Then, the patient was set-up in a stable reproducible  supine position for radiation therapy.  A BodyFix immobilization pillow was fabricated for reproducible positioning.  Then I personally applied the abdominal compression paddle to limit respiratory excursion.  4D respiratoy motion management CT images were obtained.  Surface markings were placed.  The CT images were loaded into the planning software.  Then, using Cine, MIP, and standard views, the internal target volume (ITV) and planning target volumes (PTV) were delinieated, and avoidance structures were contoured.  Treatment planning then occurred.  The radiation prescription was entered and confirmed.  A total of two complex treatment devices were fabricated in the form of the BodyFix immobilization pillow and a neck accuform cushion.  I have requested : 3D Simulation  I have requested a DVH of the following structures: Heart, Lungs, Esophagus, Chest Wall, Brachial Plexus, Major Blood Vessels, and targets.  SPECIAL TREATMENT PROCEDURE:  The planned course of therapy using radiation constitutes a special treatment  procedure. Special care is required in the management of this patient for the following reasons. This treatment constitutes a Special Treatment Procedure for the following reason: [ High dose per fraction requiring special monitoring for increased toxicities of treatment including daily imaging..  The special nature of the planned course of radiotherapy will require increased physician supervision and oversight to ensure patient's safety with optimal treatment outcomes.  RESPIRATORY MOTION MANAGEMENT SIMULATION:  In order to account for effect of respiratory motion on target structures and other organs in the planning and delivery of radiotherapy, this patient underwent respiratory motion management simulation.  To accomplish this, when the patient was brought to the CT simulation planning suite, 4D respiratoy motion management CT images were obtained.  The CT images were loaded into the planning software.  Then, using a variety of tools including Cine, MIP, and standard views, the target volume and planning target volumes (PTV) were delineated.  Avoidance structures were contoured.  Treatment planning then occurred.  Dose volume histograms were generated and reviewed for each of the requested structure.  The resulting plan was carefully reviewed and approved today.  PLAN:  The patient will receive 50 Gy in 5 fraction.  ________________________________  Sheral Apley Tammi Klippel, M.D.  This document serves as a record of services personally performed by Tyler Pita, MD. It was created on his behalf by Darcus Austin, a trained medical scribe. The creation of this record is based on the scribe's personal observations and the provider's statements to them. This document has been checked and approved by the attending provider.

## 2016-11-21 ENCOUNTER — Telehealth: Payer: Self-pay | Admitting: Radiation Oncology

## 2016-11-21 ENCOUNTER — Telehealth: Payer: Self-pay | Admitting: Internal Medicine

## 2016-11-21 NOTE — Telephone Encounter (Signed)
Phoned [employer] to inquire about the status of the pacemaker form faxed on 11/10/16. Had to leave message for Dr. Hillery Jacks nurse. Awaiting return call.

## 2016-11-21 NOTE — Telephone Encounter (Signed)
New message      Calling to check the status on the pacemaker form faxed on 11-10-16.  Pt needs to start radiation and has a pacemaker and need clearance.  Please fax form to [phone].  If you do not have the form, please call their office

## 2016-11-24 NOTE — Telephone Encounter (Signed)
LM informing Charles Savage that we do not have the radiation form. Provided fax number that form should be sent to.

## 2016-11-27 NOTE — Telephone Encounter (Signed)
LMTCB/sss  Radiation form has not been received.

## 2016-11-30 ENCOUNTER — Telehealth: Payer: Self-pay

## 2016-11-30 NOTE — Telephone Encounter (Signed)
Received incoming call from Aldona Bar, nurse at [facility]. Aldona Bar stated she has not rec'd the pacemaker clearance form from our office. Informed note from 11/16/16 stated document was faxed. [name] informed to please re-fax to [phone]. [name] verbalized understanding.

## 2016-12-07 ENCOUNTER — Encounter: Payer: Self-pay | Admitting: *Deleted

## 2016-12-07 NOTE — Progress Notes (Signed)
[place] Psychosocial Distress Screening Clinical Social Work  Clinical Social Work was referred by distress screening protocol.  The patient scored a 8 on the Psychosocial Distress Thermometer which indicates severe distress. Clinical Social Worker atttempted to contact patient by phone to assess for distress and other psychosocial needs. CSW left voicemail to return call.  ONCBCN DISTRESS SCREENING 11/10/2016  Screening Type Initial Screening  Distress experienced in past week (1-10) 8  Information Concerns Type Lack of info about diagnosis    Lauren Alver Sorrow, MSW, LCSW, OSW-C Clinical Social Worker [facility] [phone]

## 2016-12-11 ENCOUNTER — Ambulatory Visit (INDEPENDENT_AMBULATORY_CARE_PROVIDER_SITE_OTHER): Payer: Medicare HMO | Admitting: *Deleted

## 2016-12-11 DIAGNOSIS — F17210 Nicotine dependence, cigarettes, uncomplicated: Secondary | ICD-10-CM | POA: Diagnosis not present

## 2016-12-11 DIAGNOSIS — J449 Chronic obstructive pulmonary disease, unspecified: Secondary | ICD-10-CM | POA: Diagnosis not present

## 2016-12-11 DIAGNOSIS — C3431 Malignant neoplasm of lower lobe, right bronchus or lung: Secondary | ICD-10-CM | POA: Diagnosis not present

## 2016-12-11 DIAGNOSIS — E785 Hyperlipidemia, unspecified: Secondary | ICD-10-CM | POA: Diagnosis not present

## 2016-12-11 DIAGNOSIS — Z9981 Dependence on supplemental oxygen: Secondary | ICD-10-CM | POA: Diagnosis not present

## 2016-12-11 DIAGNOSIS — I509 Heart failure, unspecified: Secondary | ICD-10-CM | POA: Diagnosis not present

## 2016-12-11 DIAGNOSIS — Z8673 Personal history of transient ischemic attack (TIA), and cerebral infarction without residual deficits: Secondary | ICD-10-CM | POA: Diagnosis not present

## 2016-12-11 DIAGNOSIS — I255 Ischemic cardiomyopathy: Secondary | ICD-10-CM

## 2016-12-11 DIAGNOSIS — I2510 Atherosclerotic heart disease of native coronary artery without angina pectoris: Secondary | ICD-10-CM | POA: Diagnosis not present

## 2016-12-11 DIAGNOSIS — Z510 Encounter for antineoplastic radiation therapy: Secondary | ICD-10-CM | POA: Diagnosis not present

## 2016-12-11 NOTE — Progress Notes (Signed)
Remote ICD transmission.   

## 2016-12-12 LAB — CUP PACEART REMOTE DEVICE CHECK
HIGH POWER IMPEDANCE MEASURED VALUE: 76 Ohm
Implantable Lead Implant Date: 20131220
Implantable Pulse Generator Implant Date: 20131220
Lead Channel Impedance Value: 361 Ohm
Lead Channel Pacing Threshold Amplitude: 0.75 V
Lead Channel Pacing Threshold Pulse Width: 0.4 ms
Lead Channel Sensing Intrinsic Amplitude: 6 mV
Lead Channel Sensing Intrinsic Amplitude: 6 mV
Lead Channel Setting Pacing Pulse Width: 0.4 ms
MDC IDC LEAD LOCATION: 753860
MDC IDC MSMT BATTERY REMAINING LONGEVITY: 98 mo
MDC IDC MSMT BATTERY VOLTAGE: 3.01 V
MDC IDC MSMT LEADCHNL RV IMPEDANCE VALUE: 456 Ohm
MDC IDC SESS DTM: 20180618062828
MDC IDC SET LEADCHNL RV PACING AMPLITUDE: 2 V
MDC IDC SET LEADCHNL RV SENSING SENSITIVITY: 0.3 mV
MDC IDC STAT BRADY RV PERCENT PACED: 0.01 %

## 2016-12-13 ENCOUNTER — Ambulatory Visit
Admission: RE | Admit: 2016-12-13 | Discharge: 2016-12-13 | Disposition: A | Discharge status: Home / Self Care | Payer: Medicare HMO | Source: Ambulatory Visit | Attending: Radiation Oncology | Admitting: Radiation Oncology

## 2016-12-13 DIAGNOSIS — I509 Heart failure, unspecified: Secondary | ICD-10-CM | POA: Diagnosis not present

## 2016-12-13 DIAGNOSIS — C3431 Malignant neoplasm of lower lobe, right bronchus or lung: Secondary | ICD-10-CM | POA: Diagnosis not present

## 2016-12-13 DIAGNOSIS — Z8673 Personal history of transient ischemic attack (TIA), and cerebral infarction without residual deficits: Secondary | ICD-10-CM | POA: Diagnosis not present

## 2016-12-13 DIAGNOSIS — E785 Hyperlipidemia, unspecified: Secondary | ICD-10-CM | POA: Diagnosis not present

## 2016-12-13 DIAGNOSIS — F17210 Nicotine dependence, cigarettes, uncomplicated: Secondary | ICD-10-CM | POA: Diagnosis not present

## 2016-12-13 DIAGNOSIS — Z9981 Dependence on supplemental oxygen: Secondary | ICD-10-CM | POA: Diagnosis not present

## 2016-12-13 DIAGNOSIS — Z510 Encounter for antineoplastic radiation therapy: Secondary | ICD-10-CM | POA: Diagnosis not present

## 2016-12-13 DIAGNOSIS — I2510 Atherosclerotic heart disease of native coronary artery without angina pectoris: Secondary | ICD-10-CM | POA: Diagnosis not present

## 2016-12-13 DIAGNOSIS — J449 Chronic obstructive pulmonary disease, unspecified: Secondary | ICD-10-CM | POA: Diagnosis not present

## 2016-12-14 ENCOUNTER — Ambulatory Visit: Payer: Medicare HMO | Admitting: Radiation Oncology

## 2016-12-14 ENCOUNTER — Encounter: Payer: Self-pay | Admitting: Cardiology

## 2016-12-15 ENCOUNTER — Ambulatory Visit
Admission: RE | Admit: 2016-12-15 | Discharge: 2016-12-15 | Disposition: A | Discharge status: Home / Self Care | Payer: Medicare HMO | Source: Ambulatory Visit | Attending: Radiation Oncology | Admitting: Radiation Oncology

## 2016-12-15 DIAGNOSIS — I2510 Atherosclerotic heart disease of native coronary artery without angina pectoris: Secondary | ICD-10-CM | POA: Diagnosis not present

## 2016-12-15 DIAGNOSIS — E785 Hyperlipidemia, unspecified: Secondary | ICD-10-CM | POA: Diagnosis not present

## 2016-12-15 DIAGNOSIS — I509 Heart failure, unspecified: Secondary | ICD-10-CM | POA: Diagnosis not present

## 2016-12-15 DIAGNOSIS — Z9981 Dependence on supplemental oxygen: Secondary | ICD-10-CM | POA: Diagnosis not present

## 2016-12-15 DIAGNOSIS — Z8673 Personal history of transient ischemic attack (TIA), and cerebral infarction without residual deficits: Secondary | ICD-10-CM | POA: Diagnosis not present

## 2016-12-15 DIAGNOSIS — C3431 Malignant neoplasm of lower lobe, right bronchus or lung: Secondary | ICD-10-CM | POA: Diagnosis not present

## 2016-12-15 DIAGNOSIS — F17210 Nicotine dependence, cigarettes, uncomplicated: Secondary | ICD-10-CM | POA: Diagnosis not present

## 2016-12-15 DIAGNOSIS — Z510 Encounter for antineoplastic radiation therapy: Secondary | ICD-10-CM | POA: Diagnosis not present

## 2016-12-15 DIAGNOSIS — J449 Chronic obstructive pulmonary disease, unspecified: Secondary | ICD-10-CM | POA: Diagnosis not present

## 2016-12-15 MED ORDER — RADIAPLEXRX EX GEL
Freq: Once | CUTANEOUS | Status: AC
  Administered 2016-12-15: 16:00:00 via TOPICAL

## 2016-12-18 ENCOUNTER — Ambulatory Visit
Admission: RE | Admit: 2016-12-18 | Discharge: 2016-12-18 | Disposition: A | Discharge status: Home / Self Care | Payer: Medicare HMO | Source: Ambulatory Visit | Attending: Radiation Oncology | Admitting: Radiation Oncology

## 2016-12-18 DIAGNOSIS — I509 Heart failure, unspecified: Secondary | ICD-10-CM | POA: Diagnosis not present

## 2016-12-18 DIAGNOSIS — E785 Hyperlipidemia, unspecified: Secondary | ICD-10-CM | POA: Diagnosis not present

## 2016-12-18 DIAGNOSIS — C3431 Malignant neoplasm of lower lobe, right bronchus or lung: Secondary | ICD-10-CM | POA: Diagnosis not present

## 2016-12-18 DIAGNOSIS — F17210 Nicotine dependence, cigarettes, uncomplicated: Secondary | ICD-10-CM | POA: Diagnosis not present

## 2016-12-18 DIAGNOSIS — Z9981 Dependence on supplemental oxygen: Secondary | ICD-10-CM | POA: Diagnosis not present

## 2016-12-18 DIAGNOSIS — J449 Chronic obstructive pulmonary disease, unspecified: Secondary | ICD-10-CM | POA: Diagnosis not present

## 2016-12-18 DIAGNOSIS — Z8673 Personal history of transient ischemic attack (TIA), and cerebral infarction without residual deficits: Secondary | ICD-10-CM | POA: Diagnosis not present

## 2016-12-18 DIAGNOSIS — I2510 Atherosclerotic heart disease of native coronary artery without angina pectoris: Secondary | ICD-10-CM | POA: Diagnosis not present

## 2016-12-18 DIAGNOSIS — Z510 Encounter for antineoplastic radiation therapy: Secondary | ICD-10-CM | POA: Diagnosis not present

## 2016-12-19 ENCOUNTER — Ambulatory Visit: Payer: Medicare HMO | Admitting: Radiation Oncology

## 2016-12-20 ENCOUNTER — Ambulatory Visit
Admission: RE | Admit: 2016-12-20 | Discharge: 2016-12-20 | Disposition: A | Discharge status: Home / Self Care | Payer: Medicare HMO | Source: Ambulatory Visit | Attending: Radiation Oncology | Admitting: Radiation Oncology

## 2016-12-20 DIAGNOSIS — E785 Hyperlipidemia, unspecified: Secondary | ICD-10-CM | POA: Diagnosis not present

## 2016-12-20 DIAGNOSIS — F17210 Nicotine dependence, cigarettes, uncomplicated: Secondary | ICD-10-CM | POA: Diagnosis not present

## 2016-12-20 DIAGNOSIS — Z8673 Personal history of transient ischemic attack (TIA), and cerebral infarction without residual deficits: Secondary | ICD-10-CM | POA: Diagnosis not present

## 2016-12-20 DIAGNOSIS — J449 Chronic obstructive pulmonary disease, unspecified: Secondary | ICD-10-CM | POA: Diagnosis not present

## 2016-12-20 DIAGNOSIS — Z9981 Dependence on supplemental oxygen: Secondary | ICD-10-CM | POA: Diagnosis not present

## 2016-12-20 DIAGNOSIS — I2510 Atherosclerotic heart disease of native coronary artery without angina pectoris: Secondary | ICD-10-CM | POA: Diagnosis not present

## 2016-12-20 DIAGNOSIS — I509 Heart failure, unspecified: Secondary | ICD-10-CM | POA: Diagnosis not present

## 2016-12-20 DIAGNOSIS — Z510 Encounter for antineoplastic radiation therapy: Secondary | ICD-10-CM | POA: Diagnosis not present

## 2016-12-20 DIAGNOSIS — C3431 Malignant neoplasm of lower lobe, right bronchus or lung: Secondary | ICD-10-CM | POA: Diagnosis not present

## 2016-12-22 ENCOUNTER — Encounter: Payer: Self-pay | Admitting: Radiation Oncology

## 2016-12-22 ENCOUNTER — Ambulatory Visit
Admission: RE | Admit: 2016-12-22 | Discharge: 2016-12-22 | Disposition: A | Discharge status: Home / Self Care | Payer: Medicare HMO | Source: Ambulatory Visit | Attending: Radiation Oncology | Admitting: Radiation Oncology

## 2016-12-22 DIAGNOSIS — J449 Chronic obstructive pulmonary disease, unspecified: Secondary | ICD-10-CM | POA: Diagnosis not present

## 2016-12-22 DIAGNOSIS — E785 Hyperlipidemia, unspecified: Secondary | ICD-10-CM | POA: Diagnosis not present

## 2016-12-22 DIAGNOSIS — I2510 Atherosclerotic heart disease of native coronary artery without angina pectoris: Secondary | ICD-10-CM | POA: Diagnosis not present

## 2016-12-22 DIAGNOSIS — Z9981 Dependence on supplemental oxygen: Secondary | ICD-10-CM | POA: Diagnosis not present

## 2016-12-22 DIAGNOSIS — F17210 Nicotine dependence, cigarettes, uncomplicated: Secondary | ICD-10-CM | POA: Diagnosis not present

## 2016-12-22 DIAGNOSIS — C3431 Malignant neoplasm of lower lobe, right bronchus or lung: Secondary | ICD-10-CM | POA: Diagnosis not present

## 2016-12-22 DIAGNOSIS — Z8673 Personal history of transient ischemic attack (TIA), and cerebral infarction without residual deficits: Secondary | ICD-10-CM | POA: Diagnosis not present

## 2016-12-22 DIAGNOSIS — I509 Heart failure, unspecified: Secondary | ICD-10-CM | POA: Diagnosis not present

## 2016-12-22 DIAGNOSIS — Z510 Encounter for antineoplastic radiation therapy: Secondary | ICD-10-CM | POA: Diagnosis not present

## 2016-12-22 NOTE — Progress Notes (Signed)
One month follow up appointment card mailed to patient's home.

## 2016-12-28 NOTE — Progress Notes (Signed)
  Radiation Oncology         [phone] ________________________________  Name: Charles Savage MRN: [id number]  Date: 12/22/2016  DOB: [birth year]  End of Treatment Note  Diagnosis:  61 y.o. gentleman with Stage IA, cT1bN0M0, NSCLC, favoring adenocarcinoma of the right lower lobe  Indication for treatment:  Curative, Definitive SBRT       Radiation treatment dates:  12/13/16 - 12/22/16  Site/dose:   The right lower lung was treated to 50 Gy in 5 fractions of 10 Gy  Beams/energy:   The patient was treated using stereotactic body radiotherapy according to a 3D conformal radiotherapy plan.  Volumetric arc fields were employed to deliver 6 MV X-rays.  Image guidance was performed with per fraction cone beam CT prior to treatment under personal MD supervision.  Immobilization was achieved using BodyFix Pillow.  Narrative: The patient tolerated radiation treatment relatively well. New case of cough and shortness of breath. Denies any swallowing issues while eating or drinking.  Plan: The patient has completed radiation treatment. The patient will return to radiation oncology clinic for routine followup in one month. I advised them to call or return sooner if they have any questions or concerns related to their recovery or treatment. ________________________________  Sheral Apley. Tammi Klippel, M.D.   This document serves as a record of services personally performed by Tyler Pita MD. It was created on his behalf by Delton Coombes, a trained medical scribe. The creation of this record is based on the scribe's personal observations and the provider's statements to them. This document has been checked and approved by the attending provider.

## 2016-12-29 ENCOUNTER — Encounter: Payer: Self-pay | Admitting: Cardiology

## 2017-01-04 ENCOUNTER — Ambulatory Visit (HOSPITAL_COMMUNITY)
Admission: RE | Admit: 2017-01-04 | Discharge: 2017-01-04 | Disposition: A | Discharge status: Home / Self Care | Payer: Medicare HMO | Source: Ambulatory Visit | Attending: Family Medicine | Admitting: Family Medicine

## 2017-01-04 ENCOUNTER — Other Ambulatory Visit (HOSPITAL_COMMUNITY): Payer: Self-pay | Admitting: Family Medicine

## 2017-01-04 DIAGNOSIS — G894 Chronic pain syndrome: Secondary | ICD-10-CM | POA: Diagnosis not present

## 2017-01-04 DIAGNOSIS — M25562 Pain in left knee: Secondary | ICD-10-CM | POA: Insufficient documentation

## 2017-01-04 DIAGNOSIS — E6609 Other obesity due to excess calories: Secondary | ICD-10-CM | POA: Diagnosis not present

## 2017-01-04 DIAGNOSIS — Z1389 Encounter for screening for other disorder: Secondary | ICD-10-CM | POA: Diagnosis not present

## 2017-01-04 DIAGNOSIS — Z6821 Body mass index (BMI) 21.0-21.9, adult: Secondary | ICD-10-CM | POA: Diagnosis not present

## 2017-01-04 DIAGNOSIS — M25569 Pain in unspecified knee: Secondary | ICD-10-CM | POA: Diagnosis not present

## 2017-01-04 DIAGNOSIS — S8992XA Unspecified injury of left lower leg, initial encounter: Secondary | ICD-10-CM | POA: Diagnosis not present

## 2017-02-02 ENCOUNTER — Encounter: Payer: Self-pay | Admitting: Cardiology

## 2017-02-02 ENCOUNTER — Ambulatory Visit (INDEPENDENT_AMBULATORY_CARE_PROVIDER_SITE_OTHER): Payer: Medicare HMO | Admitting: Cardiology

## 2017-02-02 VITALS — BP 110/60 | HR 75 | Ht 71.0 in | Wt 207.0 lb

## 2017-02-02 DIAGNOSIS — I255 Ischemic cardiomyopathy: Secondary | ICD-10-CM

## 2017-02-02 DIAGNOSIS — I5022 Chronic systolic (congestive) heart failure: Secondary | ICD-10-CM | POA: Diagnosis not present

## 2017-02-02 DIAGNOSIS — I059 Rheumatic mitral valve disease, unspecified: Secondary | ICD-10-CM | POA: Diagnosis not present

## 2017-02-02 DIAGNOSIS — I6523 Occlusion and stenosis of bilateral carotid arteries: Secondary | ICD-10-CM

## 2017-02-02 DIAGNOSIS — E782 Mixed hyperlipidemia: Secondary | ICD-10-CM | POA: Diagnosis not present

## 2017-02-02 DIAGNOSIS — I2510 Atherosclerotic heart disease of native coronary artery without angina pectoris: Secondary | ICD-10-CM

## 2017-02-02 NOTE — Patient Instructions (Signed)
Medication Instructions:  Your physician recommends that you continue on your current medications as directed. Please refer to the Current Medication list given to you today.   Labwork: I will request labs from pcp  Testing/Procedures: Your physician has requested that you have a carotid duplex. This test is an ultrasound of the carotid arteries in your neck. It looks at blood flow through these arteries that supply the brain with blood. Allow one hour for this exam. There are no restrictions or special instructions.    Follow-Up: Your physician wants you to follow-up in: 6 months.  You will receive a reminder letter in the mail two months in advance. If you don't receive a letter, please call our office to schedule the follow-up appointment.   Any Other Special Instructions Will Be Listed Below (If Applicable).     If you need a refill on your cardiac medications before your next appointment, please call your pharmacy.

## 2017-02-02 NOTE — Progress Notes (Signed)
Clinical Summary Charles Savage is a 61 y.o.male seen today for follow up of the following medical problems.    1. CAD/ICM  - Echo 11/2011 LVEF 15-20%,  - CABG 09/2011 x 6 vessels (LIMA-LAD with sequential SVG to OM1, OM2, OM3. SVG-PDA o LCX, SVG to RCA.  - he has an ICD, Medtronic dual chamber ,that is followed by EP Dr. Lovena Le.  - did not tolerate coreg 25mg  bid, we cut back to 12.5mg  in AM and 25mg  in PM. Overall medication titration has been limited by soft bp's.  - echo 09/2015 that shows LVEF has normalized at 55-60%   - no recent SOB/DOE. NO recent chest pain - compliant with meds.    2. Mitral valve regurgitation - repair with 28 mm annuloplasty ring 09/2011  - no recent edema/SOB/DOE  3. Hyperlipidemia - compliant with statin 02/2016 TC 132 TG 201 HDL 29 LDL 63   4. OSA screen - 09/2015 sleep study with severe OSA.  - followed by Dr Luan Pulling  5. Carotid stenosis - 08/2015 bilateral moderate carotid stenosis.  - no recent neuro symptoms  6. Lung cancer - on radiation treatment per family report   SH: trip to [place] in 2 weeks.  Past Medical History:  Diagnosis Date  . 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 Arthritis    "back; knees" (06/14/2012)  . Cerebrovascular disease    h/o CVA in 07/2011 + left carotid endarterectomy  . CHF (congestive heart failure) ([place])   . Cholelithiasis 07/05/2012   Asymptomatic; identified on CT scanning of the chest;  . Chronic lower back pain   . COPD (chronic obstructive pulmonary disease) ([place])    on 2L O2 at home since 08/2011  . Coronary artery disease   . DDD (degenerative disc disease), lumbosacral   . Depression 09/2011   "after OHS" (06/14/2012)  . Hyperlipemia   . ICD (implantable cardiac defibrillator) in place   . Myocardial infarction ([place]) 08/2011  . Other primary cardiomyopathies   . Pneumonia   . Stroke [facility]) 07/2011   denies residual  (06/14/2012)     Allergies  Allergen Reactions  . Metoprolol Shortness Of Breath and Nausea And Vomiting  . Peach Flavor Hives and Itching    "peaches; peach flavoring; actually happened after he ate a store-bought peach pie"  . Morphine And Related Hives and Itching  . Bee Venom Swelling    Also has hives     Current Outpatient Prescriptions  Medication Sig Dispense Refill  . aspirin 81 MG tablet Take 81 mg by mouth daily.    Marland Kitchen atorvastatin (LIPITOR) 80 MG tablet TAKE ONE (1) TABLET EACH DAY 90 tablet 3  . carvedilol (COREG) 12.5 MG tablet Take 12.5 mg am and 25 mg ( 2 tablets) in the pm (Patient taking differently: Take 12.5-25 mg by mouth See admin instructions. Take 12.5 mg am and 25 mg ( 2 tablets) in the pm) 270 tablet 3  . EPIPEN 2-PAK 0.3 MG/0.3ML SOAJ injection Inject 0.3 mg as directed once.     . escitalopram (LEXAPRO) 20 MG tablet Take 20 mg by mouth daily.    . furosemide (LASIX) 20 MG tablet TAKE ONE-HALF TABLET DAILY 45 tablet 3  . hyaluronate sodium (RADIAPLEXRX) GEL Apply 1 application topically 2 (two) times daily.    Marland Kitchen HYDROcodone-acetaminophen (NORCO) 10-325 MG per tablet Take 1 tablet by mouth every 8 (eight) hours as needed. Pain (Patient  taking differently: Take 1 tablet by mouth every 8 (eight) hours as needed for moderate pain. Pain) 21 tablet 0  . levocetirizine (XYZAL) 5 MG tablet Take 5 mg by mouth at bedtime as needed for allergies.    Marland Kitchen lisinopril (PRINIVIL,ZESTRIL) 5 MG tablet Take 1 tablet (5 mg total) by mouth daily. 90 tablet 3  . meloxicam (MOBIC) 15 MG tablet Take 15 mg by mouth daily.    Marland Kitchen spironolactone (ALDACTONE) 25 MG tablet TAKE 1/2 TABLET ONCE DAILY 90 tablet 3  . tiotropium (SPIRIVA HANDIHALER) 18 MCG inhalation capsule Place 1 capsule (18 mcg total) into inhaler and inhale daily. 30 capsule 12   No current facility-administered medications for this visit.      Past Surgical History:  Procedure Laterality Date  . BACK SURGERY    .  CARDIAC CATHETERIZATION  09/2011  . CARDIAC DEFIBRILLATOR PLACEMENT  06/14/2012  . CAROTID ENDARTERECTOMY  07/2011   "left" (06/14/2012)  . COLONOSCOPY N/A 10/16/2014   Procedure: COLONOSCOPY;  Surgeon: Daneil Dolin, MD;  [facility];  Service: Endoscopy;  Laterality: N/A;  8:00 AM  . CORONARY ARTERY BYPASS GRAFT  10/20/2011   Procedure: CORONARY ARTERY BYPASS GRAFTING (CABG);  Surgeon: [name], MD;  Location: [place];  Service: Open Heart Surgery;  Laterality: N/A;  CABG x six;  using left internal mammary artery and right leg greater saphenous vein harvested endoscopically  . FOREIGN BODY REMOVAL  06/05/2011   Procedure: FOREIGN BODY REMOVAL ADULT;  Surgeon: Hermelinda Dellen;  Location: [place];  Service: Plastics;  Laterality: Right;  removal foreign body from right side face   . IMPLANTABLE CARDIOVERTER DEFIBRILLATOR IMPLANT N/A 06/14/2012   Procedure: IMPLANTABLE CARDIOVERTER DEFIBRILLATOR IMPLANT;  Surgeon: Evans Lance, MD;  Location: [facility] C[facility] LAB;  Service: Cardiovascular;  Laterality: N/A;  . KNEE ARTHROSCOPY  2002   "right" (06/14/2012)  . LEFT AND RIGHT HEART CATHETERIZATION WITH CORONARY ANGIOGRAM N/A 10/16/2011   Procedure: LEFT AND RIGHT HEART CATHETERIZATION WITH CORONARY ANGIOGRAM;  Surgeon: Jolaine Artist, MD;  Location: [facility] CATH LAB;  Service: Cardiovascular;  Laterality: N/A;  . LUMBAR Ord SURGERY  2012  . MITRAL VALVE REPAIR  10/20/2011   Procedure: MITRAL VALVE REPAIR (MVR);  Surgeon: [name], MD;  Location: [place];  Service: Open Heart Surgery;  Laterality: N/A;  . POSTERIOR LUMBAR FUSION  1996  . TEE WITHOUT CARDIOVERSION  10/17/2011   Procedure: TRANSESOPHAGEAL ECHOCARDIOGRAM (TEE);  Surgeon: Jolaine Artist, MD;  Location: [facility] ENDOSCOPY;  Service: Cardiovascular;  Laterality: N/A;     Allergies  Allergen Reactions  . Metoprolol Shortness Of Breath and Nausea And Vomiting  . Peach Flavor Hives and Itching    "peaches;  peach flavoring; actually happened after he ate a store-bought peach pie"  . Morphine And Related Hives and Itching  . Bee Venom Swelling    Also has hives      Family History  Problem Relation Age of Onset  . Coronary artery disease Mother   . Coronary artery disease Father   . Arrhythmia Brother      Social History Charles Savage reports that he has been smoking Cigarettes.  He started smoking about 47 years ago. He has a 92.00 pack-year smoking history. He has never used smokeless tobacco. Charles Savage reports that he does not drink alcohol.   Review of Systems CONSTITUTIONAL: No weight loss, fever, chills, weakness or fatigue.  HEENT: Eyes: No visual loss, blurred vision,  double vision or yellow sclerae.No hearing loss, sneezing, congestion, runny nose or sore throat.  SKIN: No rash or itching.  CARDIOVASCULAR: per hpi RESPIRATORY: per hpi GASTROINTESTINAL: No anorexia, nausea, vomiting or diarrhea. No abdominal pain or blood.  GENITOURINARY: No burning on urination, no polyuria NEUROLOGICAL: No headache, dizziness, syncope, paralysis, ataxia, numbness or tingling in the extremities. No change in bowel or bladder control.  MUSCULOSKELETAL: No muscle, back pain, joint pain or stiffness.  LYMPHATICS: No enlarged nodes. No history of splenectomy.  PSYCHIATRIC: No history of depression or anxiety.  ENDOCRINOLOGIC: No reports of sweating, cold or heat intolerance. No polyuria or polydipsia.  Marland Kitchen   Physical Examination Vitals:   02/02/17 1340  BP: 110/60  Pulse: 75  SpO2: 96%   Vitals:   02/02/17 1340  Weight: 207 lb (93.9 kg)  Height: 5\' 11"  (1.803 m)    Gen: resting comfortably, no acute distress HEENT: no scleral icterus, pupils equal round and reactive, no palptable cervical adenopathy,  CV: RRR, no m//g, no jvd Resp: Clear to auscultation bilaterally GI: abdomen is soft, non-tender, non-distended, normal bowel sounds, no hepatosplenomegaly MSK: extremities are  warm, no edema.  Skin: warm, no rash Neuro:  no focal deficits Psych: appropriate affect   Diagnostic Studies 11/2011 Echo LVEF 15-20%, anular MV ring with trivial MR, severe RV dysfunction,  09/2011 TEE LEFT VENTRICLE: EF = 25% Mildly dilated. Global HK.   RIGHT VENTRICLE: Moderately HK  LEFT ATRIUM: Moderately dilated  LEFT ATRIAL APPENDAGE: No thrombus  RIGHT ATRIUM: Normal  AORTIC VALVE: Trileaflet. Trivial AI. No AS.  MITRAL VALVE: Structurally normal. Mild to moderate MR.  TRICUSPID VALVE: Normal Trivial TR  PULMONIC VALVE: Normal  INTERATRIAL SEPTUM: No ASD or PFO.  PERICARDIUM: Moderate effusion along RA//RV. No tamponade.  DESCENDING AORTA: Severe plaque.  09/2011 Cath Findings: On milrinone 0.25 mg.kg/min  RA = 5  RV = 30/5/7  PA = 30/16 (23)  PCW = 20 (no significant v-waves)  Fick cardiac output/index = 5.46/2.8  PVR = 0.5 [place]  SVR = 981  FA sat = 93%  PA sat = 67%, 71% (on milrinone)  Ao Pressure: 92/63 (76)  LV Pressure: 98/8/18  There was no signficant gradient across the aortic valve on pullback.  Left main: 80-90% distal  LAD: Flush occlusion at ostium. Mild filling in mid and distal segments through L to L and R to L collaterals  LCX: Large. Dominant. 70-80% mid AV groove. OM-1 small to moderate vessel with mild ostial disease. OM-2 very large 99% lesion in mid section at trifurcation. 90% lesion in lowest Audyn Dimercurio. PDA 50-60 ostial  RCA: Small to moderate-sized, non- dominant vessel ending with acute marginal Andilyn Bettcher. Diffuse 60% prox to mid. 95% midsection. R to L collats to LAD from acute marginal.  LV-gram done in the RAO projection: Ejection fraction = 25-30% with global HK 3+ MR  L subclavian: Widely patent to chest wall with mild plaquing  Assessment: 1. 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 Ischemic CM with EF 25-30%  3. 3+ mitral regurgitation  4. Well compensated hemodynamics on  milrinone  Plan/Discussion: Will need CABG/MVR. Check PFTs and TEE. Continue milrinone. Transfer stepdown. Consult TCTS.   09/2015 echo Study Conclusions  - Left ventricle: The cavity size was mildly dilated. Wall thickness was normal. Systolic function was normal. The estimated ejection fraction was in the range of 55% to 60%. Wall motion was normal; there were no regional wall motion abnormalities. Features are consistent  with a pseudonormal left ventricular filling pattern, with concomitant abnormal relaxation and increased filling pressure (grade 2 diastolic dysfunction). - Aortic valve: There was mild regurgitation. - Mitral valve: Mildly thickened leaflets . There was trivial regurgitation. Valve area by pressure half-time: 2.27 cm^2. - Left atrium: The atrium was mildly dilated. - Right ventricle: Pacer wire or catheter noted in right ventricle. - Right atrium: The atrium was mildly dilated. - Atrial septum: No defect or patent foramen ovale was identified. - Tricuspid valve: There was trivial regurgitation. - Pulmonary arteries: PA peak pressure: 34 mm Hg (S). - Pericardium, extracardiac: There was no pericardial effusion.  Impressions:  - Mildly dilated LV chamber size with LVEF 55-60%. Grade 2 diastolic dysfunction with increased LV filling pressure. Mild left atrial enlargement. Status post mitral annuloplasty with mildly thickened leaflets and trivial mitral regurgitation. Mild aortic regurgitation. Device wire noted within the right heart. Trivial tricuspid regurgitation with PASP 34 mmHg.     Assessment and Plan  1. CAD/ICM/Chronic systolic HF - LVEF 04-88% by echo 11/2011, NYHA III, he has a Medtronic ICD - titration of medications has been limited by low blood pressures and orthostatic symptoms. Most recent echo shows normalization of LVEF  - continue current management  2. Mitral valve regurgitation - s/p repair no recent  symptoms - we will continue to monitor  3. Hyperlipidemia -he will  continue high dose statin in setting of CAD   4. OSA  - follow with Dr Luan Pulling  5. Carotid stenosis - moderate bilatera disease. Repeat carotid US 02/2017.    F/u 6 months      Arnoldo Lenis, M.D.

## 2017-02-06 ENCOUNTER — Ambulatory Visit: Payer: Self-pay | Admitting: Urology

## 2017-02-06 DIAGNOSIS — Z79891 Long term (current) use of opiate analgesic: Secondary | ICD-10-CM | POA: Diagnosis not present

## 2017-02-06 DIAGNOSIS — Z6830 Body mass index (BMI) 30.0-30.9, adult: Secondary | ICD-10-CM | POA: Diagnosis not present

## 2017-02-06 DIAGNOSIS — E6609 Other obesity due to excess calories: Secondary | ICD-10-CM | POA: Diagnosis not present

## 2017-02-06 DIAGNOSIS — M545 Low back pain: Secondary | ICD-10-CM | POA: Diagnosis not present

## 2017-02-09 ENCOUNTER — Ambulatory Visit
Admission: RE | Admit: 2017-02-09 | Discharge: 2017-02-09 | Disposition: A | Discharge status: Home / Self Care | Payer: Medicare HMO | Source: Ambulatory Visit | Attending: Urology | Admitting: Urology

## 2017-02-09 VITALS — BP 133/67 | HR 71 | Temp 98.3°F | Resp 18 | Ht 71.0 in | Wt 208.0 lb

## 2017-02-09 DIAGNOSIS — C3431 Malignant neoplasm of lower lobe, right bronchus or lung: Secondary | ICD-10-CM

## 2017-02-09 NOTE — Progress Notes (Signed)
Radiation Oncology         [phone] ________________________________  Name: Charles Savage MRN: [id number]  Date: 02/09/2017  DOB: 03-08-1956  Post Treatment Note  CC: Sharilyn Sites, MD  [name], MD  Diagnosis:   61 y.o. 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  Interval Since Last Radiation:  7 weeks; Curative, Definitive SBRT  12/13/16 - 12/22/16:  The right lower lung was treated to 50 Gy in 5 fractions of 10 Gy  Narrative:  The patient returns today for routine follow-up.  He tolerated radiation treatment relatively well. He did not experience any increase in cough or shortness of breath and denied any dysphagia with solids or liquids.                             On review of systems, the patient states that he is doing well in general. He specifically denies hemoptysis, increased cough or shortness of breath, fevers, chills or night sweats. He reports a healthy appetite and is maintaining his weight. He denies abdominal pain, nausea, vomiting or diarrhea. He reports that his energy level is fair and continues to gradually improve with time.  More recently, he has had a few episodes where he broke out into a sweat and felt lightheaded. The symptoms resolved quickly once he had something to eat. The episodes do seem to correlate with going extended periods of time between meals. He is not diabetic and has not had prior issues with his blood sugar to his knowledge.  ALLERGIES:  is allergic to metoprolol; peach flavor; morphine and related; and bee venom.  Meds: Current Outpatient Prescriptions  Medication Sig Dispense Refill  . aspirin 81 MG tablet Take 81 mg by mouth daily.    Marland Kitchen atorvastatin (LIPITOR) 80 MG tablet TAKE ONE (1) TABLET EACH DAY 90 tablet 3  . carvedilol (COREG) 12.5 MG tablet Take 12.5 mg am and 25 mg ( 2 tablets) in the pm (Patient taking differently: Take 12.5-25 mg by mouth See admin instructions. Take 12.5  mg am and 25 mg ( 2 tablets) in the pm) 270 tablet 3  . celecoxib (CELEBREX) 200 MG capsule Take 200 mg by mouth daily.     Marland Kitchen escitalopram (LEXAPRO) 20 MG tablet Take 20 mg by mouth daily.    . furosemide (LASIX) 20 MG tablet TAKE ONE-HALF TABLET DAILY 45 tablet 3  . HYDROcodone-acetaminophen (NORCO) 10-325 MG per tablet Take 1 tablet by mouth every 8 (eight) hours as needed. Pain (Patient taking differently: Take 1 tablet by mouth every 8 (eight) hours as needed for moderate pain. Pain) 21 tablet 0  . levocetirizine (XYZAL) 5 MG tablet Take 5 mg by mouth at bedtime as needed for allergies.    Marland Kitchen lisinopril (PRINIVIL,ZESTRIL) 5 MG tablet Take 1 tablet (5 mg total) by mouth daily. 90 tablet 3  . spironolactone (ALDACTONE) 25 MG tablet TAKE 1/2 TABLET ONCE DAILY 90 tablet 3  . tiotropium (SPIRIVA HANDIHALER) 18 MCG inhalation capsule Place 1 capsule (18 mcg total) into inhaler and inhale daily. 30 capsule 12  . EPIPEN 2-PAK 0.3 MG/0.3ML SOAJ injection Inject 0.3 mg as directed once.      No current facility-administered medications for this encounter.     Physical Findings:  height is 5\' 11"  (1.803 m) and weight is 208 lb (94.3 kg). His oral temperature is 98.3 F (36.8 C). His blood pressure  is 133/67 and his pulse is 71. His respiration is 18 and oxygen saturation is 97%.  Pain Assessment Pain Score: 6  (Back,hips,legs)/10 In general this is a well appearing Caucasian male in no acute distress. He's alert and oriented x4 and appropriate throughout the examination. Cardiopulmonary assessment is negative for acute distress and he exhibits normal effort.   Lab Findings: Lab Results  Component Value Date   WBC 12.7 (H) 11/03/2016   HGB 13.8 11/03/2016   HCT 39.9 11/03/2016   MCV 90.5 11/03/2016   PLT 259 11/03/2016     Radiographic Findings: No results found.  Impression/Plan: 30. 61 y.o. 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 The  patient appears to be doing well following radiotherapy. We will proceed with a CT scan of the chest with contrast and BMP at [facility] to make sure that he can receive contrast before he undergoes a CT scan in the next week. We will aim to get this scheduled for the first part of next week as he and his wife are planning to leave for the beach on Thursday, August 23rd and will be gone through Sunday, August 26th.   I'll follow up with him by phone with these results, and provided that this scan is stable, we will move forward with serial imaging at three to six-month intervals until 5 years when, at that point in time, he will have an annual low dose CT scan for surveillance. He will return in 3 months to review the findings from the scan to be ordered prior to that visit, provided that the scan ordered today is stable.  2. Tobacco abuse: The patient continues to smoke less than half a pack per day. The patient is counseled on the importance of smoking cessation.  We will continue to discuss this further at subsequent visits.   3. Hypoglycemia. The patient reports recent symptoms which are likely attributable to a drop in his blood sugar. He is encouraged to eat breakfast in the mornings which he does not typically. He is also encouraged to eat a small snack between meals when he anticipates going more than 6 hours between meals.  If his symptoms continue despite implementing these recommendations and/or become more frequent or severe, he is advised to contact his PCP immediately for further evaluation and management.    Nicholos Johns, PA-C

## 2017-02-12 ENCOUNTER — Telehealth: Payer: Self-pay | Admitting: *Deleted

## 2017-02-12 NOTE — Telephone Encounter (Signed)
CALLED PATIENT TO INFORM OF CT FOR 02-13-17 - ARRIVAL TIME - 11:15 AM FOR LABS AND TEST TO BE ON 02-13-17 - ARRIVAL TIME - 11:45 AM @ [place] , PT. TO BE NPO- 4 HRS. PRIOR TO TEST, ASHLYN TO CALL PATIENT WITH RESULTS, LVM FOR A RETURN CALL

## 2017-02-13 ENCOUNTER — Telehealth: Payer: Self-pay | Admitting: Urology

## 2017-02-13 ENCOUNTER — Ambulatory Visit (HOSPITAL_COMMUNITY)
Admission: RE | Admit: 2017-02-13 | Discharge: 2017-02-13 | Disposition: A | Discharge status: Home / Self Care | Payer: Medicare HMO | Source: Ambulatory Visit | Attending: Urology | Admitting: Urology

## 2017-02-13 DIAGNOSIS — R928 Other abnormal and inconclusive findings on diagnostic imaging of breast: Secondary | ICD-10-CM | POA: Diagnosis not present

## 2017-02-13 DIAGNOSIS — C3431 Malignant neoplasm of lower lobe, right bronchus or lung: Secondary | ICD-10-CM

## 2017-02-13 DIAGNOSIS — R918 Other nonspecific abnormal finding of lung field: Secondary | ICD-10-CM | POA: Diagnosis not present

## 2017-02-13 DIAGNOSIS — R590 Localized enlarged lymph nodes: Secondary | ICD-10-CM | POA: Diagnosis not present

## 2017-02-13 DIAGNOSIS — I700 Atherosclerosis of aorta: Secondary | ICD-10-CM | POA: Diagnosis not present

## 2017-02-13 DIAGNOSIS — J439 Emphysema, unspecified: Secondary | ICD-10-CM | POA: Diagnosis not present

## 2017-02-13 LAB — POCT I-STAT CREATININE: CREATININE: 1.3 mg/dL — AB (ref 0.61–1.24)

## 2017-02-13 MED ORDER — IOPAMIDOL (ISOVUE-300) INJECTION 61%
75.0000 mL | Freq: Once | INTRAVENOUS | Status: AC | PRN
  Administered 2017-02-13: 75 mL via INTRAVENOUS

## 2017-02-13 NOTE — Telephone Encounter (Signed)
I called and spoke with Charles Savage his wife pain M, regarding results on the recent follow-up CT chest scan that he had earlier today. This scan shows a good response to treatment with decreased size of the right lower lobe mass as well as the treated lymph node. There are no new worrisome lesions noted. I advised that we will proceed as planned with a repeat CT chest scan in 3 months time with a follow-up visit to discuss those results. She is in agreement with this plan and reports that she will share this good news with her husband as soon as she gets home. I encouraged her to call with any questions or concerns they have in the interim.   Charles Johns, PA-C

## 2017-03-12 ENCOUNTER — Ambulatory Visit (INDEPENDENT_AMBULATORY_CARE_PROVIDER_SITE_OTHER): Payer: Medicare HMO | Admitting: *Deleted

## 2017-03-12 DIAGNOSIS — I255 Ischemic cardiomyopathy: Secondary | ICD-10-CM | POA: Diagnosis not present

## 2017-03-12 DIAGNOSIS — I5022 Chronic systolic (congestive) heart failure: Secondary | ICD-10-CM

## 2017-03-12 NOTE — Progress Notes (Signed)
Remote ICD transmission.   

## 2017-03-13 LAB — CUP PACEART REMOTE DEVICE CHECK
Battery Remaining Longevity: 97 mo
Battery Voltage: 3.01 V
Brady Statistic RV Percent Paced: 0.01 %
Date Time Interrogation Session: 20180917041808
HIGH POWER IMPEDANCE MEASURED VALUE: 78 Ohm
Implantable Pulse Generator Implant Date: 20131220
Lead Channel Impedance Value: 418 Ohm
Lead Channel Impedance Value: 475 Ohm
Lead Channel Pacing Threshold Pulse Width: 0.4 ms
Lead Channel Sensing Intrinsic Amplitude: 4.375 mV
Lead Channel Sensing Intrinsic Amplitude: 4.375 mV
Lead Channel Setting Pacing Amplitude: 2 V
Lead Channel Setting Pacing Pulse Width: 0.4 ms
MDC IDC LEAD IMPLANT DT: 20131220
MDC IDC LEAD LOCATION: 753860
MDC IDC MSMT LEADCHNL RV PACING THRESHOLD AMPLITUDE: 0.75 V
MDC IDC SET LEADCHNL RV SENSING SENSITIVITY: 0.3 mV

## 2017-03-15 ENCOUNTER — Encounter: Payer: Self-pay | Admitting: Cardiology

## 2017-03-21 ENCOUNTER — Ambulatory Visit (HOSPITAL_COMMUNITY)
Admission: RE | Admit: 2017-03-21 | Discharge: 2017-03-21 | Disposition: A | Discharge status: Home / Self Care | Payer: Medicare HMO | Source: Ambulatory Visit | Attending: Cardiology | Admitting: Cardiology

## 2017-03-21 DIAGNOSIS — F17210 Nicotine dependence, cigarettes, uncomplicated: Secondary | ICD-10-CM | POA: Diagnosis not present

## 2017-03-21 DIAGNOSIS — I6523 Occlusion and stenosis of bilateral carotid arteries: Secondary | ICD-10-CM | POA: Diagnosis not present

## 2017-03-21 DIAGNOSIS — Z8673 Personal history of transient ischemic attack (TIA), and cerebral infarction without residual deficits: Secondary | ICD-10-CM | POA: Insufficient documentation

## 2017-03-21 DIAGNOSIS — E785 Hyperlipidemia, unspecified: Secondary | ICD-10-CM | POA: Insufficient documentation

## 2017-03-21 DIAGNOSIS — I10 Essential (primary) hypertension: Secondary | ICD-10-CM | POA: Diagnosis not present

## 2017-03-28 ENCOUNTER — Telehealth: Payer: Self-pay | Admitting: *Deleted

## 2017-03-28 NOTE — Telephone Encounter (Signed)
-----   Message from Arnoldo Lenis, MD sent at 03/27/2017  1:35 PM EDT ----- Carotid US looks good  J BrancH MD

## 2017-03-28 NOTE — Telephone Encounter (Signed)
Called patient with test results. No answer. Left message to call back.  

## 2017-03-29 NOTE — Telephone Encounter (Signed)
Pt's wife returned call --can be reached @ [phone]

## 2017-03-29 NOTE — Telephone Encounter (Signed)
I spoke with wife,results given, copied pcp

## 2017-03-29 NOTE — Telephone Encounter (Signed)
Attempt to reach, got VM,LMTCB-cc

## 2017-04-13 DIAGNOSIS — J449 Chronic obstructive pulmonary disease, unspecified: Secondary | ICD-10-CM | POA: Diagnosis not present

## 2017-04-13 DIAGNOSIS — R7309 Other abnormal glucose: Secondary | ICD-10-CM | POA: Diagnosis not present

## 2017-04-13 DIAGNOSIS — I739 Peripheral vascular disease, unspecified: Secondary | ICD-10-CM | POA: Diagnosis not present

## 2017-04-13 DIAGNOSIS — E785 Hyperlipidemia, unspecified: Secondary | ICD-10-CM | POA: Diagnosis not present

## 2017-04-13 DIAGNOSIS — I2510 Atherosclerotic heart disease of native coronary artery without angina pectoris: Secondary | ICD-10-CM | POA: Diagnosis not present

## 2017-04-13 DIAGNOSIS — G894 Chronic pain syndrome: Secondary | ICD-10-CM | POA: Diagnosis not present

## 2017-04-13 DIAGNOSIS — E119 Type 2 diabetes mellitus without complications: Secondary | ICD-10-CM | POA: Diagnosis not present

## 2017-04-13 DIAGNOSIS — I5022 Chronic systolic (congestive) heart failure: Secondary | ICD-10-CM | POA: Diagnosis not present

## 2017-04-13 DIAGNOSIS — Z23 Encounter for immunization: Secondary | ICD-10-CM | POA: Diagnosis not present

## 2017-04-13 DIAGNOSIS — Z6831 Body mass index (BMI) 31.0-31.9, adult: Secondary | ICD-10-CM | POA: Diagnosis not present

## 2017-05-09 DIAGNOSIS — E875 Hyperkalemia: Secondary | ICD-10-CM | POA: Diagnosis not present

## 2017-05-11 ENCOUNTER — Ambulatory Visit: Payer: Self-pay | Admitting: Urology

## 2017-05-11 DIAGNOSIS — Z6831 Body mass index (BMI) 31.0-31.9, adult: Secondary | ICD-10-CM | POA: Diagnosis not present

## 2017-05-11 DIAGNOSIS — G894 Chronic pain syndrome: Secondary | ICD-10-CM | POA: Diagnosis not present

## 2017-05-11 DIAGNOSIS — R1319 Other dysphagia: Secondary | ICD-10-CM | POA: Diagnosis not present

## 2017-05-11 DIAGNOSIS — K219 Gastro-esophageal reflux disease without esophagitis: Secondary | ICD-10-CM | POA: Diagnosis not present

## 2017-05-11 DIAGNOSIS — E6609 Other obesity due to excess calories: Secondary | ICD-10-CM | POA: Diagnosis not present

## 2017-05-15 ENCOUNTER — Encounter: Payer: Self-pay | Admitting: Internal Medicine

## 2017-05-16 ENCOUNTER — Telehealth: Payer: Self-pay | Admitting: *Deleted

## 2017-05-16 NOTE — Telephone Encounter (Signed)
CALLED PATIENT TO INFORM OF CT ON 05-29-17 - ARRIVAL TIME - 2:15 PM, PT. TO BE NPO- 4 HRS. PRIOR TO TEST @ [place] [facility] AND PT. TO FU WITH ASHLYN BRUNING PA FOR DR. MANNING ON 05-30-17 @ 8:30 AM, SPOKE WITH [name] WIFE -PAM AND SHE IS AWARE OF THESE APPTS

## 2017-05-23 ENCOUNTER — Ambulatory Visit (HOSPITAL_COMMUNITY): Payer: Medicare HMO

## 2017-05-25 NOTE — Progress Notes (Signed)
Harl V. Servello 61 y.o. woman with stage IA, cT1bN0M0, NSCLC, favoring adenocarcinoma of the right lower lobe radiation completed 12-22-16, review 05-29-17 CT chest w contrast, FU.   Weight changes, if any: Wt Readings from Last 3 Encounters:  05/30/17 208 lb (94.3 kg)  02/09/17 208 lb (94.3 kg)  02/02/17 207 lb (93.9 kg)   Respiratory complaints, if any: Denies SOB and wheezing reports coughing clear secretion occssionally Hemoptysis, if any:  No Swallowing Problems/Pain/Difficulty swallowing: NO Appetite :Good Pain:6/10 back and hips Hydrocodone prn BP (!) 136/97 (BP Location: Right Arm, Patient Position: Sitting, Cuff Size: Normal)   Pulse (!) 58   Temp 97.7 F (36.5 C) (Oral)   Resp 20   Ht 5\' 11"  (1.803 m)   Wt 208 lb (94.3 kg)   SpO2 99%   BMI 29.01 kg/m

## 2017-05-29 ENCOUNTER — Ambulatory Visit (HOSPITAL_COMMUNITY)
Admission: RE | Admit: 2017-05-29 | Discharge: 2017-05-29 | Disposition: A | Discharge status: Home / Self Care | Payer: Medicare HMO | Source: Ambulatory Visit | Attending: Urology | Admitting: Urology

## 2017-05-29 DIAGNOSIS — J439 Emphysema, unspecified: Secondary | ICD-10-CM | POA: Diagnosis not present

## 2017-05-29 DIAGNOSIS — I700 Atherosclerosis of aorta: Secondary | ICD-10-CM | POA: Diagnosis not present

## 2017-05-29 DIAGNOSIS — C3431 Malignant neoplasm of lower lobe, right bronchus or lung: Secondary | ICD-10-CM | POA: Diagnosis not present

## 2017-05-29 LAB — POCT I-STAT CREATININE: CREATININE: 1.2 mg/dL (ref 0.61–1.24)

## 2017-05-29 MED ORDER — IOPAMIDOL (ISOVUE-300) INJECTION 61%
75.0000 mL | Freq: Once | INTRAVENOUS | Status: AC | PRN
  Administered 2017-05-29: 75 mL via INTRAVENOUS

## 2017-05-30 ENCOUNTER — Ambulatory Visit
Admission: RE | Admit: 2017-05-30 | Discharge: 2017-05-30 | Disposition: A | Discharge status: Home / Self Care | Payer: Medicare HMO | Source: Ambulatory Visit | Attending: Urology | Admitting: Urology

## 2017-05-30 ENCOUNTER — Encounter: Payer: Self-pay | Admitting: Urology

## 2017-05-30 ENCOUNTER — Other Ambulatory Visit: Payer: Self-pay

## 2017-05-30 VITALS — BP 136/97 | HR 58 | Temp 97.7°F | Resp 20 | Ht 71.0 in | Wt 208.0 lb

## 2017-05-30 DIAGNOSIS — C3431 Malignant neoplasm of lower lobe, right bronchus or lung: Secondary | ICD-10-CM | POA: Insufficient documentation

## 2017-05-30 DIAGNOSIS — Z85118 Personal history of other malignant neoplasm of bronchus and lung: Secondary | ICD-10-CM | POA: Diagnosis not present

## 2017-05-30 DIAGNOSIS — Z08 Encounter for follow-up examination after completed treatment for malignant neoplasm: Secondary | ICD-10-CM | POA: Diagnosis not present

## 2017-05-30 DIAGNOSIS — F17210 Nicotine dependence, cigarettes, uncomplicated: Secondary | ICD-10-CM | POA: Diagnosis not present

## 2017-05-30 NOTE — Progress Notes (Addendum)
Radiation Oncology         [phone] ________________________________  Name: Charles Savage MRN: [id number]  Date: 05/30/2017  DOB: 1955-12-15  Post Treatment Note  CC: Sharilyn Sites, MD  [name], MD  Diagnosis:   61 y.o. 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  Interval Since Last Radiation: 5 months; Curative, Definitive SBRT  12/13/16 - 12/22/16:  The right lower lung was treated to 50 Gy in 5 fractions of 10 Gy  Narrative:  The patient returns today for routine follow-up and to review results from his recent follow up CT Chest obtained on 05/29/17.  He has recovered well from the effects of radiotherapy. CT shows an excellent response to radiotherapy with the previously treated RLL lesion no longer visualized and no evidence of disease recurrence or metastasis.                On review of systems, the patient states that he is doing well in general. He specifically denies hemoptysis, increased cough or shortness of breath, fevers, chills or night sweats. He reports a healthy appetite and is maintaining his weight. He denies abdominal pain, nausea, vomiting or diarrhea. He reports that his energy level is fair and has continued to gradually improve over time.  He has not had any further hypoglycemic episodes of where he broke out into a sweat and felt lightheaded. He has started eating breakfast and making sure that he does not skip meals or go long periods of time between eating which he thinks has helped resolve this issue.  ALLERGIES:  is allergic to metoprolol; peach flavor; morphine and related; and bee venom.  Meds: Current Outpatient Medications  Medication Sig Dispense Refill  . aspirin 81 MG tablet Take 81 mg by mouth daily.    Marland Kitchen atorvastatin (LIPITOR) 80 MG tablet TAKE ONE (1) TABLET EACH DAY 90 tablet 3  . carvedilol (COREG) 12.5 MG tablet Take 12.5 mg am and 25 mg ( 2 tablets) in the pm (Patient taking differently:  Take 12.5-25 mg by mouth See admin instructions. Take 12.5 mg am and 25 mg ( 2 tablets) in the pm) 270 tablet 3  . celecoxib (CELEBREX) 200 MG capsule Take 200 mg by mouth daily.     Marland Kitchen escitalopram (LEXAPRO) 20 MG tablet Take 20 mg by mouth daily.    . furosemide (LASIX) 20 MG tablet TAKE ONE-HALF TABLET DAILY 45 tablet 3  . HYDROcodone-acetaminophen (NORCO) 10-325 MG per tablet Take 1 tablet by mouth every 8 (eight) hours as needed. Pain (Patient taking differently: Take 1 tablet by mouth every 8 (eight) hours as needed for moderate pain. Pain) 21 tablet 0  . levocetirizine (XYZAL) 5 MG tablet Take 5 mg by mouth at bedtime as needed for allergies.    Marland Kitchen lisinopril (PRINIVIL,ZESTRIL) 5 MG tablet Take 1 tablet (5 mg total) by mouth daily. 90 tablet 3  . spironolactone (ALDACTONE) 25 MG tablet TAKE 1/2 TABLET ONCE DAILY 90 tablet 3  . tiotropium (SPIRIVA HANDIHALER) 18 MCG inhalation capsule Place 1 capsule (18 mcg total) into inhaler and inhale daily. 30 capsule 12  . EPIPEN 2-PAK 0.3 MG/0.3ML SOAJ injection Inject 0.3 mg as directed once.      No current facility-administered medications for this encounter.     Physical Findings:  height is 5\' 11"  (1.803 m) and weight is 208 lb (94.3 kg). His oral temperature is 97.7 F (36.5 C). His blood pressure is 136/97 (  abnormal) and his pulse is 58 (abnormal). His respiration is 20 and oxygen saturation is 99%.  Pain Assessment Pain Score: 6  Pain Loc: Back(Back,Hips)/10 In general this is a well appearing Caucasian male in no acute distress. He's alert and oriented x4 and appropriate throughout the examination. Cardiopulmonary assessment is negative for acute distress and he exhibits normal effort.   Lab Findings: Lab Results  Component Value Date   WBC 12.7 (H) 11/03/2016   HGB 13.8 11/03/2016   HCT 39.9 11/03/2016   MCV 90.5 11/03/2016   PLT 259 11/03/2016     Radiographic Findings: No results found.  Impression/Plan: 47. 61 y.o.  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 The patient has recovered well from the effects of radiotherapy. Recent CT Chest imaging reveals no evidence of recurrent or metastatic disease. There are evolutionary post-treatment changes in the right lower lobe but the previously measured right lower lobe lesion is no longer visualized.  We will we will move forward with serial  CT imaging of the chest with contrast at six-month intervals until 5 years when, at that point in time, he will have an annual low dose CT scan for surveillance. He will return in 6 months to review the findings from the scan to be ordered prior to that visit.  He prefers to have his labs (BMP) and imaging at [facility] since this is much closer to his home.   2. Tobacco abuse: The patient continues to smoke less than half a pack per day. The patient is counseled on the importance of smoking cessation but is still not ready to commit to quitting.  We will continue to discuss this further at subsequent visits.    Nicholos Johns, PA-C

## 2017-05-30 NOTE — Addendum Note (Signed)
Encounter addended by: Malena Edman, RN on: 05/30/2017 9:49 AM  Actions taken: Charge Capture section accepted

## 2017-06-11 ENCOUNTER — Ambulatory Visit (INDEPENDENT_AMBULATORY_CARE_PROVIDER_SITE_OTHER): Payer: Medicare HMO | Admitting: *Deleted

## 2017-06-11 DIAGNOSIS — F419 Anxiety disorder, unspecified: Secondary | ICD-10-CM | POA: Diagnosis not present

## 2017-06-11 DIAGNOSIS — J8410 Pulmonary fibrosis, unspecified: Secondary | ICD-10-CM | POA: Diagnosis not present

## 2017-06-11 DIAGNOSIS — M5136 Other intervertebral disc degeneration, lumbar region: Secondary | ICD-10-CM | POA: Diagnosis not present

## 2017-06-11 DIAGNOSIS — I2510 Atherosclerotic heart disease of native coronary artery without angina pectoris: Secondary | ICD-10-CM | POA: Diagnosis not present

## 2017-06-11 DIAGNOSIS — I255 Ischemic cardiomyopathy: Secondary | ICD-10-CM | POA: Diagnosis not present

## 2017-06-11 DIAGNOSIS — J449 Chronic obstructive pulmonary disease, unspecified: Secondary | ICD-10-CM | POA: Diagnosis not present

## 2017-06-11 DIAGNOSIS — I5022 Chronic systolic (congestive) heart failure: Secondary | ICD-10-CM

## 2017-06-11 DIAGNOSIS — Z0001 Encounter for general adult medical examination with abnormal findings: Secondary | ICD-10-CM | POA: Diagnosis not present

## 2017-06-11 DIAGNOSIS — Z6831 Body mass index (BMI) 31.0-31.9, adult: Secondary | ICD-10-CM | POA: Diagnosis not present

## 2017-06-11 NOTE — Progress Notes (Signed)
Remote ICD transmission.   

## 2017-06-13 ENCOUNTER — Encounter: Payer: Self-pay | Admitting: Cardiology

## 2017-06-13 LAB — CUP PACEART REMOTE DEVICE CHECK
Battery Remaining Longevity: 93 mo
HighPow Impedance: 70 Ohm
Implantable Lead Implant Date: 20131220
Implantable Lead Location: 753860
Implantable Lead Model: 6935
Lead Channel Impedance Value: 456 Ohm
Lead Channel Pacing Threshold Amplitude: 0.75 V
Lead Channel Sensing Intrinsic Amplitude: 6.75 mV
Lead Channel Setting Pacing Pulse Width: 0.4 ms
Lead Channel Setting Sensing Sensitivity: 0.3 mV
MDC IDC MSMT BATTERY VOLTAGE: 3.01 V
MDC IDC MSMT LEADCHNL RV IMPEDANCE VALUE: 361 Ohm
MDC IDC MSMT LEADCHNL RV PACING THRESHOLD PULSEWIDTH: 0.4 ms
MDC IDC MSMT LEADCHNL RV SENSING INTR AMPL: 6.75 mV
MDC IDC PG IMPLANT DT: 20131220
MDC IDC SESS DTM: 20181217083527
MDC IDC SET LEADCHNL RV PACING AMPLITUDE: 2 V
MDC IDC STAT BRADY RV PERCENT PACED: 0.01 %

## 2017-07-02 ENCOUNTER — Other Ambulatory Visit: Payer: Self-pay

## 2017-07-02 MED ORDER — SPIRONOLACTONE 25 MG PO TABS: 12.5000 mg | ORAL_TABLET | Freq: Every day | ORAL | 3 refills | Status: DC

## 2017-07-02 MED ORDER — ATORVASTATIN CALCIUM 80 MG PO TABS: ORAL_TABLET | ORAL | 3 refills | Status: DC

## 2017-07-02 MED ORDER — FUROSEMIDE 20 MG PO TABS: 10.0000 mg | ORAL_TABLET | Freq: Every day | ORAL | 3 refills | Status: DC

## 2017-07-04 ENCOUNTER — Other Ambulatory Visit: Payer: Self-pay

## 2017-07-04 MED ORDER — FUROSEMIDE 20 MG PO TABS: 10.0000 mg | ORAL_TABLET | Freq: Every day | ORAL | 3 refills | Status: DC

## 2017-07-04 MED ORDER — ATORVASTATIN CALCIUM 80 MG PO TABS: ORAL_TABLET | ORAL | 3 refills | Status: DC

## 2017-07-04 MED ORDER — SPIRONOLACTONE 25 MG PO TABS: 12.5000 mg | ORAL_TABLET | Freq: Every day | ORAL | 3 refills | Status: DC

## 2017-07-05 ENCOUNTER — Ambulatory Visit: Payer: Medicare HMO | Admitting: Gastroenterology

## 2017-07-10 ENCOUNTER — Other Ambulatory Visit: Payer: Self-pay | Admitting: *Deleted

## 2017-07-10 MED ORDER — ATORVASTATIN CALCIUM 80 MG PO TABS: ORAL_TABLET | ORAL | 3 refills | Status: DC

## 2017-07-10 MED ORDER — SPIRONOLACTONE 25 MG PO TABS: 12.5000 mg | ORAL_TABLET | Freq: Every day | ORAL | 3 refills | Status: DC

## 2017-07-10 MED ORDER — FUROSEMIDE 20 MG PO TABS: 10.0000 mg | ORAL_TABLET | Freq: Every day | ORAL | 3 refills | Status: DC

## 2017-07-12 ENCOUNTER — Other Ambulatory Visit: Payer: Self-pay | Admitting: Cardiology

## 2017-07-12 DIAGNOSIS — E6609 Other obesity due to excess calories: Secondary | ICD-10-CM | POA: Diagnosis not present

## 2017-07-12 DIAGNOSIS — G894 Chronic pain syndrome: Secondary | ICD-10-CM | POA: Diagnosis not present

## 2017-07-12 DIAGNOSIS — M7712 Lateral epicondylitis, left elbow: Secondary | ICD-10-CM | POA: Diagnosis not present

## 2017-07-12 DIAGNOSIS — Z6830 Body mass index (BMI) 30.0-30.9, adult: Secondary | ICD-10-CM | POA: Diagnosis not present

## 2017-07-12 MED ORDER — LISINOPRIL 5 MG PO TABS: 5.0000 mg | ORAL_TABLET | Freq: Every day | ORAL | 3 refills | Status: DC

## 2017-07-12 NOTE — Telephone Encounter (Signed)
Refill on Lisinopril sent to [facility]

## 2017-07-12 NOTE — Telephone Encounter (Signed)
Refill faxed to [facility] for Lisinopril

## 2017-08-13 DIAGNOSIS — E6609 Other obesity due to excess calories: Secondary | ICD-10-CM | POA: Diagnosis not present

## 2017-08-13 DIAGNOSIS — G894 Chronic pain syndrome: Secondary | ICD-10-CM | POA: Diagnosis not present

## 2017-08-13 DIAGNOSIS — Z6830 Body mass index (BMI) 30.0-30.9, adult: Secondary | ICD-10-CM | POA: Diagnosis not present

## 2017-09-10 ENCOUNTER — Ambulatory Visit (INDEPENDENT_AMBULATORY_CARE_PROVIDER_SITE_OTHER): Payer: Medicare HMO | Admitting: *Deleted

## 2017-09-10 DIAGNOSIS — I255 Ischemic cardiomyopathy: Secondary | ICD-10-CM | POA: Diagnosis not present

## 2017-09-10 DIAGNOSIS — I5022 Chronic systolic (congestive) heart failure: Secondary | ICD-10-CM

## 2017-09-10 NOTE — Progress Notes (Signed)
Remote ICD transmission.   

## 2017-09-12 ENCOUNTER — Encounter: Payer: Self-pay | Admitting: Cardiology

## 2017-09-12 DIAGNOSIS — E6609 Other obesity due to excess calories: Secondary | ICD-10-CM | POA: Diagnosis not present

## 2017-09-12 DIAGNOSIS — R7309 Other abnormal glucose: Secondary | ICD-10-CM | POA: Diagnosis not present

## 2017-09-12 DIAGNOSIS — J8410 Pulmonary fibrosis, unspecified: Secondary | ICD-10-CM | POA: Diagnosis not present

## 2017-09-12 DIAGNOSIS — E118 Type 2 diabetes mellitus with unspecified complications: Secondary | ICD-10-CM | POA: Diagnosis not present

## 2017-09-12 DIAGNOSIS — Z719 Counseling, unspecified: Secondary | ICD-10-CM | POA: Diagnosis not present

## 2017-09-12 DIAGNOSIS — I739 Peripheral vascular disease, unspecified: Secondary | ICD-10-CM | POA: Diagnosis not present

## 2017-09-12 DIAGNOSIS — I2510 Atherosclerotic heart disease of native coronary artery without angina pectoris: Secondary | ICD-10-CM | POA: Diagnosis not present

## 2017-09-12 DIAGNOSIS — E785 Hyperlipidemia, unspecified: Secondary | ICD-10-CM | POA: Diagnosis not present

## 2017-09-12 DIAGNOSIS — Z6830 Body mass index (BMI) 30.0-30.9, adult: Secondary | ICD-10-CM | POA: Diagnosis not present

## 2017-09-12 DIAGNOSIS — J449 Chronic obstructive pulmonary disease, unspecified: Secondary | ICD-10-CM | POA: Diagnosis not present

## 2017-09-18 LAB — CUP PACEART REMOTE DEVICE CHECK
Battery Remaining Longevity: 89 mo
Battery Voltage: 3.01 V
Brady Statistic RV Percent Paced: 0.01 %
HIGH POWER IMPEDANCE MEASURED VALUE: 69 Ohm
Implantable Lead Model: 6935
Lead Channel Impedance Value: 342 Ohm
Lead Channel Impedance Value: 418 Ohm
Lead Channel Setting Pacing Pulse Width: 0.4 ms
Lead Channel Setting Sensing Sensitivity: 0.3 mV
MDC IDC LEAD IMPLANT DT: 20131220
MDC IDC LEAD LOCATION: 753860
MDC IDC MSMT LEADCHNL RV PACING THRESHOLD AMPLITUDE: 0.625 V
MDC IDC MSMT LEADCHNL RV PACING THRESHOLD PULSEWIDTH: 0.4 ms
MDC IDC MSMT LEADCHNL RV SENSING INTR AMPL: 4.125 mV
MDC IDC MSMT LEADCHNL RV SENSING INTR AMPL: 4.125 mV
MDC IDC PG IMPLANT DT: 20131220
MDC IDC SESS DTM: 20190318083728
MDC IDC SET LEADCHNL RV PACING AMPLITUDE: 2 V

## 2017-09-26 ENCOUNTER — Encounter: Payer: Self-pay | Admitting: Internal Medicine

## 2017-09-26 ENCOUNTER — Ambulatory Visit (INDEPENDENT_AMBULATORY_CARE_PROVIDER_SITE_OTHER): Payer: Medicare HMO | Admitting: Internal Medicine

## 2017-09-26 VITALS — BP 124/62 | HR 55 | Ht 71.0 in | Wt 207.8 lb

## 2017-09-26 DIAGNOSIS — I5022 Chronic systolic (congestive) heart failure: Secondary | ICD-10-CM | POA: Diagnosis not present

## 2017-09-26 DIAGNOSIS — I255 Ischemic cardiomyopathy: Secondary | ICD-10-CM

## 2017-09-26 NOTE — Progress Notes (Addendum)
HPI [name] returns today for followup. He is a very pleasant 62 year old man with an ischemic cardiomyopathy, chronic systolic heart failure, status post ICD implantation. In the interim, the patient has done well. He denies chest pain, shortness of breath, or syncope. No ICD shocks. He admits to being sedentary. He has been diagnosed and treated for lung CA. He is still smoking a PPD of cigarettes.  Allergies  Allergen Reactions  . Metoprolol Shortness Of Breath and Nausea And Vomiting  . Peach Flavor Hives and Itching    "peaches; peach flavoring; actually happened after he ate a store-bought peach pie"  . Morphine And Related Hives and Itching  . Bee Venom Swelling    Also has hives     Current Outpatient Medications  Medication Sig Dispense Refill  . aspirin 81 MG tablet Take 81 mg by mouth daily.    Marland Kitchen atorvastatin (LIPITOR) 80 MG tablet TAKE ONE (1) TABLET EACH DAY 90 tablet 3  . carvedilol (COREG) 12.5 MG tablet Take 12.5 mg am and 25 mg ( 2 tablets) in the pm (Patient taking differently: Take 12.5-25 mg by mouth See admin instructions. Take 12.5 mg am and 25 mg ( 2 tablets) in the pm) 270 tablet 3  . celecoxib (CELEBREX) 200 MG capsule Take 200 mg by mouth daily.     Marland Kitchen EPIPEN 2-PAK 0.3 MG/0.3ML SOAJ injection Inject 0.3 mg as directed once.     . escitalopram (LEXAPRO) 20 MG tablet Take 20 mg by mouth daily.    Marland Kitchen HYDROcodone-acetaminophen (NORCO) 10-325 MG per tablet Take 1 tablet by mouth every 8 (eight) hours as needed. Pain (Patient taking differently: Take 1 tablet by mouth every 8 (eight) hours as needed for moderate pain. Pain) 21 tablet 0  . levocetirizine (XYZAL) 5 MG tablet Take 5 mg by mouth at bedtime as needed for allergies.    Marland Kitchen lisinopril (PRINIVIL,ZESTRIL) 5 MG tablet Take 1 tablet (5 mg total) by mouth daily. 90 tablet 3  . tiotropium (SPIRIVA HANDIHALER) 18 MCG inhalation capsule Place 1 capsule (18 mcg total) into inhaler and inhale daily. 30 capsule  12   No current facility-administered medications for this visit.      Past Medical History:  Diagnosis Date  . 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 Arthritis    "back; knees" (06/14/2012)  . Cerebrovascular disease    h/o CVA in 07/2011 + left carotid endarterectomy  . CHF (congestive heart failure) ([place])   . Cholelithiasis 07/05/2012   Asymptomatic; identified on CT scanning of the chest;  . Chronic lower back pain   . COPD (chronic obstructive pulmonary disease) ([place])    on 2L O2 at home since 08/2011  . Coronary artery disease   . DDD (degenerative disc disease), lumbosacral   . Depression 09/2011   "after OHS" (06/14/2012)  . Hyperlipemia   . ICD (implantable cardiac defibrillator) in place   . Myocardial infarction ([place]) 08/2011  . Other primary cardiomyopathies   . Pneumonia   . Stroke [facility]) 07/2011   denies residual (06/14/2012)    ROS:   All systems reviewed and negative except as noted in the HPI.   Past Surgical History:  Procedure Laterality Date  . BACK SURGERY    . CARDIAC CATHETERIZATION  09/2011  . CARDIAC DEFIBRILLATOR PLACEMENT  06/14/2012  . CAROTID ENDARTERECTOMY  07/2011   "left" (06/14/2012)  . COLONOSCOPY N/A 10/16/2014   Procedure:  COLONOSCOPY;  Surgeon: Daneil Dolin, MD;  [facility];  Service: Endoscopy;  Laterality: N/A;  8:00 AM  . CORONARY ARTERY BYPASS GRAFT  10/20/2011   Procedure: CORONARY ARTERY BYPASS GRAFTING (CABG);  Surgeon: [name], MD;  Location: [facility];  Service: Open Heart Surgery;  Laterality: N/A;  CABG x six;  using left internal mammary artery and right leg greater saphenous vein harvested endoscopically  . FOREIGN BODY REMOVAL  06/05/2011   Procedure: FOREIGN BODY REMOVAL ADULT;  Surgeon: Hermelinda Dellen;  Location: [place];  Service: Plastics;  Laterality: Right;  removal foreign body from right side face   . IMPLANTABLE  CARDIOVERTER DEFIBRILLATOR IMPLANT N/A 06/14/2012   Procedure: IMPLANTABLE CARDIOVERTER DEFIBRILLATOR IMPLANT;  Surgeon: Evans Lance, MD;  Location: [facility] CATH LAB;  Service: Cardiovascular;  Laterality: N/A;  . KNEE ARTHROSCOPY  2002   "right" (06/14/2012)  . LEFT AND RIGHT HEART CATHETERIZATION WITH CORONARY ANGIOGRAM N/A 10/16/2011   Procedure: LEFT AND RIGHT HEART CATHETERIZATION WITH CORONARY ANGIOGRAM;  Surgeon: Jolaine Artist, MD;  Location: [facility] CATH LAB;  Service: Cardiovascular;  Laterality: N/A;  . LUMBAR [place] SURGERY  2012  . MITRAL VALVE REPAIR  10/20/2011   Procedure: MITRAL VALVE REPAIR (MVR);  Surgeon: [name], MD;  Location: [place];  Service: Open Heart Surgery;  Laterality: N/A;  . POSTERIOR LUMBAR FUSION  1996  . TEE WITHOUT CARDIOVERSION  10/17/2011   Procedure: TRANSESOPHAGEAL ECHOCARDIOGRAM (TEE);  Surgeon: Jolaine Artist, MD;  Location: [facility] ENDOSCOPY;  Service: Cardiovascular;  Laterality: N/A;     Family History  Problem Relation Age of Onset  . Coronary artery disease Mother   . Coronary artery disease Father   . Arrhythmia Brother      Social History   Socioeconomic History  . Marital status: Married    Spouse name: Not on file  . Number of children: Not on file  . Years of education: Not on file  . Highest education level: Not on file  Occupational History  . Not on file  Social Needs  . Financial resource strain: Not on file  . Food insecurity:    Worry: Not on file    Inability: Not on file  . Transportation needs:    Medical: Not on file    Non-medical: Not on file  Tobacco Use  . Smoking status: Current Every Day Smoker    Packs/day: 1.00    Years: 46.00    Pack years: 46.00    Types: Cigarettes    Start date: 12/12/1969  . Smokeless tobacco: Never Used  . Tobacco comment: currently smoking 2 cigarettes per day  Substance and Sexual Activity  . Alcohol use: No    Alcohol/week: 0.0 oz    Comment: 06/14/2012 "used to drink 12  pk/night; quit for good 2 yr ago"  . Drug use: No  . Sexual activity: Never    Comment: Quit drinking alcohol 2 yrs ago  Lifestyle  . Physical activity:    Days per week: Not on file    Minutes per session: Not on file  . Stress: Not on file  Relationships  . Social connections:    Talks on phone: Not on file    Gets together: Not on file    Attends religious service: Not on file    Active member of club or organization: Not on file    Attends meetings of clubs or organizations: Not on file  Relationship status: Not on file  . Intimate partner violence:    Fear of current or ex partner: Not on file    Emotionally abused: Not on file    Physically abused: Not on file    Forced sexual activity: Not on file  Other Topics Concern  . 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  Lives with wife.     BP 124/62   Pulse (!) 55   Ht 5\' 11"  (1.803 m)   Wt 207 lb 12.8 oz (94.3 kg)   SpO2 94%   BMI 28.98 kg/m   Physical Exam:  stable appearing 62 yo man, NAD HEENT: Unremarkable Neck:  6 cm JVD, no thyromegally Lymphatics:  No adenopathy Back:  No CVA tenderness Lungs:  Clear with no wheezes HEART:  Regular rate rhythm, no murmurs, no rubs, no clicks Abd:  soft, positive bowel sounds, no organomegally, no rebound, no guarding Ext:  2 plus pulses, no edema, no cyanosis, no clubbing Skin:  No rashes no nodules Neuro:  CN II through XII intact, motor grossly intact  DEVICE  Normal device function.  See PaceArt for details.   Assess/Plan: 1. Chronic systolic heart failure - his symptoms are class 2. No change in meds. His last echo demonstrated normalization of his LV function. 2. ICM - he is s/p CABG and he denies angina. 3. Tobacco abuse - I have strongly encouraged the patient to stop smoking. He does not yet appear to be ready to stop. 4. ICD - his Medtronic ICD is working normally. Will recheck in several months.  Mikle Bosworth.D.

## 2017-09-26 NOTE — Patient Instructions (Signed)
Medication Instructions:  Your physician recommends that you continue on your current medications as directed. Please refer to the Current Medication list given to you today.   Labwork: NONE   Testing/Procedures: NONE   Follow-Up: Your physician wants you to follow-up in: 1 Year with Dr. Lovena Le. You will receive a reminder letter in the mail two months in advance. If you don't receive a letter, please call our office to schedule the follow-up appointment.  Remote monitoring is used to monitor your Pacemaker of ICD from home. This monitoring reduces the number of office visits required to check your device to one time per year. It allows [place] to keep an eye on the functioning of your device to ensure it is working properly. You are scheduled for a device check from home on 12/10/17. You may send your transmission at any time that day. If you have a wireless device, the transmission will be sent automatically. After your physician reviews your transmission, you will receive a postcard with your next transmission date.    Any Other Special Instructions Will Be Listed Below (If Applicable).     If you need a refill on your cardiac medications before your next appointment, please call your pharmacy.  Thank you for choosing [facility]!

## 2017-09-27 ENCOUNTER — Ambulatory Visit (INDEPENDENT_AMBULATORY_CARE_PROVIDER_SITE_OTHER): Payer: Medicare HMO | Admitting: Cardiology

## 2017-09-27 ENCOUNTER — Encounter: Payer: Self-pay | Admitting: Cardiology

## 2017-09-27 VITALS — BP 110/63 | HR 62 | Ht 71.0 in | Wt 205.0 lb

## 2017-09-27 DIAGNOSIS — E782 Mixed hyperlipidemia: Secondary | ICD-10-CM

## 2017-09-27 DIAGNOSIS — I2510 Atherosclerotic heart disease of native coronary artery without angina pectoris: Secondary | ICD-10-CM | POA: Diagnosis not present

## 2017-09-27 DIAGNOSIS — I6523 Occlusion and stenosis of bilateral carotid arteries: Secondary | ICD-10-CM | POA: Diagnosis not present

## 2017-09-27 DIAGNOSIS — I5022 Chronic systolic (congestive) heart failure: Secondary | ICD-10-CM | POA: Diagnosis not present

## 2017-09-27 DIAGNOSIS — G4730 Sleep apnea, unspecified: Secondary | ICD-10-CM

## 2017-09-27 NOTE — Patient Instructions (Signed)
Medication Instructions:  Your physician recommends that you continue on your current medications as directed. Please refer to the Current Medication list given to you today.   Labwork: NONE  Testing/Procedures: NONE  Follow-Up: Your physician wants you to follow-up in: 6 MONTHS .  You will receive a reminder letter in the mail two months in advance. If you don't receive a letter, please call our office to schedule the follow-up appointment.   Any Other Special Instructions Will Be Listed Below (If Applicable). You have been referred to DR. HAWKINS     If you need a refill on your cardiac medications before your next appointment, please call your pharmacy.

## 2017-09-27 NOTE — Progress Notes (Signed)
Clinical Summary Charles Savage is a 62 y.o.male seen today for follow up of the following medical problems.    1. CAD/ICM  - Echo 11/2011 LVEF 15-20%,  - CABG 09/2011 x 6 vessels (LIMA-LAD with sequential SVG to OM1, OM2, OM3. SVG-PDA o LCX, SVG to RCA.  - he has an ICD, Medtronic dual chamber ,that is followed by EP Dr. Lovena Le.  - did not tolerate coreg 25mg  bid, we cut back to 12.5mg  in AM and 25mg  in PM. Overall medication titration has been limited by soft bp's.  - echo 09/2015 that shows LVEF has normalized at 55-60%   - no recent SOB/DOE. NO recent chest pain - compliant with meds.  - seen by Dr Hall Busing just yesterday, normal ICD function    2. Mitral valve regurgitation - repair with 28 mm annuloplasty ring 09/2011  - no recent edema/SOB/DOE  3. Hyperlipidemia - he remains compliant with statin   4. OSA  - 09/2015 sleep study with severe OSA.  - followed by Dr Luan Pulling   5. Carotid stenosis - history of left CEA - denies any recent symptoms  6. Lung cancer - on radiation treatment per family report - resolved. Followed Dr Tammi Klippel oncology.     Past Medical History:  Diagnosis Date  . 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 Arthritis    "back; knees" (06/14/2012)  . Cerebrovascular disease    h/o CVA in 07/2011 + left carotid endarterectomy  . CHF (congestive heart failure) ([place])   . Cholelithiasis 07/05/2012   Asymptomatic; identified on CT scanning of the chest;  . Chronic lower back pain   . COPD (chronic obstructive pulmonary disease) ([place])    on 2L O2 at home since 08/2011  . Coronary artery disease   . DDD (degenerative disc disease), lumbosacral   . Depression 09/2011   "after OHS" (06/14/2012)  . Hyperlipemia   . ICD (implantable cardiac defibrillator) in place   . Myocardial infarction ([place]) 08/2011  . Other primary cardiomyopathies   . Pneumonia   . Stroke [facility]) 07/2011   denies residual (06/14/2012)     Allergies  Allergen Reactions  . Metoprolol Shortness Of Breath and Nausea And Vomiting  . Peach Flavor Hives and Itching    "peaches; peach flavoring; actually happened after he ate a store-bought peach pie"  . Morphine And Related Hives and Itching  . Bee Venom Swelling    Also has hives     Current Outpatient Medications  Medication Sig Dispense Refill  . aspirin 81 MG tablet Take 81 mg by mouth daily.    Marland Kitchen atorvastatin (LIPITOR) 80 MG tablet TAKE ONE (1) TABLET EACH DAY 90 tablet 3  . carvedilol (COREG) 12.5 MG tablet Take 12.5 mg am and 25 mg ( 2 tablets) in the pm (Patient taking differently: Take 12.5-25 mg by mouth See admin instructions. Take 12.5 mg am and 25 mg ( 2 tablets) in the pm) 270 tablet 3  . celecoxib (CELEBREX) 200 MG capsule Take 200 mg by mouth daily.     Marland Kitchen EPIPEN 2-PAK 0.3 MG/0.3ML SOAJ injection Inject 0.3 mg as directed once.     . escitalopram (LEXAPRO) 20 MG tablet Take 20 mg by mouth daily.    Marland Kitchen HYDROcodone-acetaminophen (NORCO) 10-325 MG per tablet Take 1 tablet by mouth every 8 (eight) hours as needed. Pain (Patient taking differently: Take 1 tablet by mouth every 8 (eight) hours as needed  for moderate pain. Pain) 21 tablet 0  . levocetirizine (XYZAL) 5 MG tablet Take 5 mg by mouth at bedtime as needed for allergies.    Marland Kitchen lisinopril (PRINIVIL,ZESTRIL) 5 MG tablet Take 1 tablet (5 mg total) by mouth daily. 90 tablet 3  . tiotropium (SPIRIVA HANDIHALER) 18 MCG inhalation capsule Place 1 capsule (18 mcg total) into inhaler and inhale daily. 30 capsule 12   No current facility-administered medications for this visit.      Past Surgical History:  Procedure Laterality Date  . BACK SURGERY    . CARDIAC CATHETERIZATION  09/2011  . CARDIAC DEFIBRILLATOR PLACEMENT  06/14/2012  . CAROTID ENDARTERECTOMY  07/2011   "left" (06/14/2012)  . COLONOSCOPY N/A 10/16/2014   Procedure: COLONOSCOPY;  Surgeon: Daneil Dolin, MD;  [facility];  Service: Endoscopy;  Laterality: N/A;  8:00 AM  . CORONARY ARTERY BYPASS GRAFT  10/20/2011   Procedure: CORONARY ARTERY BYPASS GRAFTING (CABG);  Surgeon: [name], MD;  Location: [place];  Service: Open Heart Surgery;  Laterality: N/A;  CABG x six;  using left internal mammary artery and right leg greater saphenous vein harvested endoscopically  . FOREIGN BODY REMOVAL  06/05/2011   Procedure: FOREIGN BODY REMOVAL ADULT;  Surgeon: Hermelinda Dellen;  Location: [place];  Service: Plastics;  Laterality: Right;  removal foreign body from right side face   . IMPLANTABLE CARDIOVERTER DEFIBRILLATOR IMPLANT N/A 06/14/2012   Procedure: IMPLANTABLE CARDIOVERTER DEFIBRILLATOR IMPLANT;  Surgeon: Evans Lance, MD;  Location: [facility] CATH LAB;  Service: Cardiovascular;  Laterality: N/A;  . KNEE ARTHROSCOPY  2002   "right" (06/14/2012)  . LEFT AND RIGHT HEART CATHETERIZATION WITH CORONARY ANGIOGRAM N/A 10/16/2011   Procedure: LEFT AND RIGHT HEART CATHETERIZATION WITH CORONARY ANGIOGRAM;  Surgeon: Jolaine Artist, MD;  Location: [facility] CATH LAB;  Service: Cardiovascular;  Laterality: N/A;  . LUMBAR [place] SURGERY  2012  . MITRAL VALVE REPAIR  10/20/2011   Procedure: MITRAL VALVE REPAIR (MVR);  Surgeon: [name], MD;  Location: [place];  Service: Open Heart Surgery;  Laterality: N/A;  . POSTERIOR LUMBAR FUSION  1996  . TEE WITHOUT CARDIOVERSION  10/17/2011   Procedure: TRANSESOPHAGEAL ECHOCARDIOGRAM (TEE);  Surgeon: Jolaine Artist, MD;  Location: [facility] ENDOSCOPY;  Service: Cardiovascular;  Laterality: N/A;     Allergies  Allergen Reactions  . Metoprolol Shortness Of Breath and Nausea And Vomiting  . Peach Flavor Hives and Itching    "peaches; peach flavoring; actually happened after he ate a store-bought peach pie"  . Morphine And Related Hives and Itching  . Bee Venom Swelling    Also has hives      Family History  Problem Relation Age of Onset  . Coronary artery  disease Mother   . Coronary artery disease Father   . Arrhythmia Brother      Social History Mr. Laymon reports that he has been smoking cigarettes.  He started smoking about 47 years ago. He has a 46.00 pack-year smoking history. He has never used smokeless tobacco. Mr. Stiggers reports that he does not drink alcohol.   Review of Systems CONSTITUTIONAL: No weight loss, fever, chills, weakness or fatigue.  HEENT: Eyes: No visual loss, blurred vision, double vision or yellow sclerae.No hearing loss, sneezing, congestion, runny nose or sore throat.  SKIN: No rash or itching.  CARDIOVASCULAR: per hpi RESPIRATORY: No shortness of breath, cough or sputum.  GASTROINTESTINAL: No anorexia, nausea, vomiting or diarrhea. No abdominal pain  or blood.  GENITOURINARY: No burning on urination, no polyuria NEUROLOGICAL: No headache, dizziness, syncope, paralysis, ataxia, numbness or tingling in the extremities. No change in bowel or bladder control.  MUSCULOSKELETAL: No muscle, back pain, joint pain or stiffness.  LYMPHATICS: No enlarged nodes. No history of splenectomy.  PSYCHIATRIC: No history of depression or anxiety.  ENDOCRINOLOGIC: No reports of sweating, cold or heat intolerance. No polyuria or polydipsia.  Marland Kitchen   Physical Examination Vitals:   09/27/17 1103  BP: 110/63  Pulse: 62  SpO2: 92%   Vitals:   09/27/17 1103  Weight: 205 lb (93 kg)  Height: 5\' 11"  (1.803 m)    Gen: resting comfortably, no acute distress HEENT: no scleral icterus, pupils equal round and reactive, no palptable cervical adenopathy,  CV: RRR, no m/r/g, no jvd Resp: Clear to auscultation bilaterally GI: abdomen is soft, non-tender, non-distended, normal bowel sounds, no hepatosplenomegaly MSK: extremities are warm, no edema.  Skin: warm, no rash Neuro:  no focal deficits Psych: appropriate affect   Diagnostic Studies 11/2011 Echo LVEF 15-20%, anular MV ring with trivial MR, severe RV dysfunction,   09/2011 TEE LEFT VENTRICLE: EF = 25% Mildly dilated. Global HK.   RIGHT VENTRICLE: Moderately HK  LEFT ATRIUM: Moderately dilated  LEFT ATRIAL APPENDAGE: No thrombus  RIGHT ATRIUM: Normal  AORTIC VALVE: Trileaflet. Trivial AI. No AS.  MITRAL VALVE: Structurally normal. Mild to moderate MR.  TRICUSPID VALVE: Normal Trivial TR  PULMONIC VALVE: Normal  INTERATRIAL SEPTUM: No ASD or PFO.  PERICARDIUM: Moderate effusion along RA//RV. No tamponade.  DESCENDING AORTA: Severe plaque.  09/2011 Cath Findings: On milrinone 0.25 mg.kg/min  RA = 5  RV = 30/5/7  PA = 30/16 (23)  PCW = 20 (no significant v-waves)  Fick cardiac output/index = 5.46/2.8  PVR = 0.5 [place]  SVR = 981  FA sat = 93%  PA sat = 67%, 71% (on milrinone)  Ao Pressure: 92/63 (76)  LV Pressure: 98/8/18  There was no signficant gradient across the aortic valve on pullback.  Left main: 80-90% distal  LAD: Flush occlusion at ostium. Mild filling in mid and distal segments through L to L and R to L collaterals  LCX: Large. Dominant. 70-80% mid AV groove. OM-1 small to moderate vessel with mild ostial disease. OM-2 very large 99% lesion in mid section at trifurcation. 90% lesion in lowest [place] No. PDA 50-60 ostial  RCA: Small to moderate-sized, non- dominant vessel ending with acute marginal [place]la[place]. Diffuse 60% prox to mid. 95% midsection. R to L collats to LAD from acute marginal.  LV-gram done in the RAO projection: Ejection fraction = 25-30% with global HK 3+ MR  L subclavian: Widely patent to chest wall with mild plaquing  Assessment: 1. 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 Ischemic CM with EF 25-30%  3. 3+ mitral regurgitation  4. Well compensated hemodynamics on milrinone  Plan/Discussion: Will need CABG/MVR. Check PFTs and TEE. Continue milrinone. Transfer stepdown. Consult TCTS.   09/2015 echo Study Conclusions  - Left ventricle: The cavity size  was mildly dilated. Wall thickness was normal. Systolic function was normal. The estimated ejection fraction was in the range of 55% to 60%. Wall motion was normal; there were no regional wall motion abnormalities. Features are consistent with a pseudonormal left ventricular filling pattern, with concomitant abnormal relaxation and increased filling pressure (grade 2 diastolic dysfunction). - Aortic valve: There was mild regurgitation. - Mitral valve: Mildly thickened leaflets . There was trivial regurgitation.  Valve area by pressure half-time: 2.27 cm^2. - Left atrium: The atrium was mildly dilated. - Right ventricle: Pacer wire or catheter noted in right ventricle. - Right atrium: The atrium was mildly dilated. - Atrial septum: No defect or patent foramen ovale was identified. - Tricuspid valve: There was trivial regurgitation. - Pulmonary arteries: PA peak pressure: 34 mm Hg (S). - Pericardium, extracardiac: There was no pericardial effusion.  Impressions:  - Mildly dilated LV chamber size with LVEF 55-60%. Grade 2 diastolic dysfunction with increased LV filling pressure. Mild left atrial enlargement. Status post mitral annuloplasty with mildly thickened leaflets and trivial mitral regurgitation. Mild aortic regurgitation. Device wire noted within the right heart. Trivial tricuspid regurgitation with PASP 34 mmHg.       Assessment and Plan   1. CAD/ICM/Chronic systolic HF - LVEF as low as 15-20%. Last echo 09/2015 showed LVEF had normalized.  - in general his medical therapy had been limited by soft bp's and orthostatic symptoms.  - no recent symptoms, continue current meds    2. Mitral valve regurgitation - s/p repair  - doing well, no symptoms. Continue to monitor.   3. Hyperlipidemia -continue statin, request labs from pcp   4. OSA  - follow with Dr Luan Pulling  5. Carotid stenosis - no recent symptoms. Prior left CEA - continue  medical therapy.     F/u 6 months   Arnoldo Lenis, M.D.

## 2017-10-01 ENCOUNTER — Other Ambulatory Visit: Payer: Self-pay | Admitting: *Deleted

## 2017-10-01 ENCOUNTER — Telehealth: Payer: Self-pay | Admitting: *Deleted

## 2017-10-01 ENCOUNTER — Encounter: Payer: Self-pay | Admitting: *Deleted

## 2017-10-01 ENCOUNTER — Encounter: Payer: Self-pay | Admitting: Gastroenterology

## 2017-10-01 ENCOUNTER — Ambulatory Visit (INDEPENDENT_AMBULATORY_CARE_PROVIDER_SITE_OTHER): Payer: Medicare HMO | Admitting: Gastroenterology

## 2017-10-01 DIAGNOSIS — K219 Gastro-esophageal reflux disease without esophagitis: Secondary | ICD-10-CM | POA: Diagnosis not present

## 2017-10-01 DIAGNOSIS — K439 Ventral hernia without obstruction or gangrene: Secondary | ICD-10-CM

## 2017-10-01 DIAGNOSIS — R1310 Dysphagia, unspecified: Secondary | ICD-10-CM

## 2017-10-01 DIAGNOSIS — R1319 Other dysphagia: Secondary | ICD-10-CM

## 2017-10-01 NOTE — Progress Notes (Signed)
Primary Care Physician:  Sharilyn Sites, MD  Primary Gastroenterologist:  Garfield Cornea, MD   Chief Complaint  Patient presents with  . Dysphagia    food    HPI:  Charles Savage is a 62 y.o. male here at the request of Dr. Hilma Favors for further evaluation of dysphagia.  Patient states she has had these symptoms for quite some time.  Predominantly to solid foods.  Episodes where he had to vomit to relieve the obstruction.  Recently saw PCP and was started on pantoprazole.  This is helped his reflux symptoms quite a bit.  Dysphagia somewhat better.  Has to avoid certain foods, particularly dry foods like bread and dry meats.  Patient has multiple comorbidities including coronary artery disease status post CABG, implantable cardiac defibrillator, sleep apnea, mitral valve repair, COPD.  Diagnosed with non-small cell lung carcinoma last May, surgical intervention not offered due to ongoing smoking and lung disease but he did undergo radiation treatments.  Patient is scheduled to see Dr. Luan Pulling later this month for sleep apnea.    Patient also has a history of ventral hernia in the upper abdomen which is easily reducible and nontender, does not cause him any problems.    No prior upper endoscopy.  Last colonoscopy was in 2016, multiple hyperplastic rectal polyps removed.  Next colonoscopy planned for 2026 health permitting.  Current Outpatient Medications  Medication Sig Dispense Refill  . aspirin 81 MG tablet Take 81 mg by mouth daily.    Marland Kitchen atorvastatin (LIPITOR) 80 MG tablet TAKE ONE (1) TABLET EACH DAY 90 tablet 3  . carvedilol (COREG) 12.5 MG tablet Take 12.5 mg am and 25 mg ( 2 tablets) in the pm (Patient taking differently: Take 12.5-25 mg by mouth See admin instructions. Take 12.5 mg am and 25 mg ( 2 tablets) in the pm) 270 tablet 3  . celecoxib (CELEBREX) 200 MG capsule Take 200 mg by mouth daily.     Marland Kitchen EPIPEN 2-PAK 0.3 MG/0.3ML SOAJ injection Inject 0.3 mg as directed once.     .  escitalopram (LEXAPRO) 20 MG tablet Take 20 mg by mouth daily.    Marland Kitchen HYDROcodone-acetaminophen (NORCO) 10-325 MG per tablet Take 1 tablet by mouth every 8 (eight) hours as needed. Pain (Patient taking differently: Take 1 tablet by mouth every 8 (eight) hours as needed for moderate pain. Pain) 21 tablet 0  . levocetirizine (XYZAL) 5 MG tablet Take 5 mg by mouth at bedtime as needed for allergies.    Marland Kitchen lisinopril (PRINIVIL,ZESTRIL) 5 MG tablet Take 1 tablet (5 mg total) by mouth daily. 90 tablet 3  . pantoprazole (PROTONIX) 40 MG tablet Take 40 mg by mouth daily.    Marland Kitchen tiotropium (SPIRIVA HANDIHALER) 18 MCG inhalation capsule Place 1 capsule (18 mcg total) into inhaler and inhale daily. 30 capsule 12   No current facility-administered medications for this visit.     Allergies as of 10/01/2017 - Review Complete 10/01/2017  Allergen Reaction Noted  . Metoprolol Shortness Of Breath and Nausea And Vomiting 10/09/2011  . Peach flavor Hives and Itching 10/09/2011  . Morphine and related Hives and Itching 09/02/2010  . Bee venom Swelling 11/10/2016    Past Medical History:  Diagnosis Date  . 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 Arthritis    "back; knees" (06/14/2012)  . Cerebrovascular disease    h/o CVA in 07/2011 + left carotid endarterectomy  . CHF (congestive heart failure) ([place])   .  Cholelithiasis 07/05/2012   Asymptomatic; identified on CT scanning of the chest;  . Chronic lower back pain   . COPD (chronic obstructive pulmonary disease) ([place])    on 2L O2 at home since 08/2011  . Coronary artery disease   . DDD (degenerative disc disease), lumbosacral   . Depression 09/2011   "after OHS" (06/14/2012)  . Hyperlipemia   . ICD (implantable cardiac defibrillator) in place   . Lung cancer ([place]) 2018   Radiation treatment  . Myocardial infarction ([place]) 08/2011  . Other primary cardiomyopathies   . Pneumonia   . Sleep apnea   .  Stroke [facility]) 07/2011   denies residual (06/14/2012)    Past Surgical History:  Procedure Laterality Date  . BACK SURGERY    . CARDIAC CATHETERIZATION  09/2011  . CARDIAC DEFIBRILLATOR PLACEMENT  06/14/2012  . CAROTID ENDARTERECTOMY  07/2011   "left" (06/14/2012)  . COLONOSCOPY N/A 10/16/2014   Dr. Gala Romney: rectal polyps removed (hyperplastic). next TCS 10 years.   . CORONARY ARTERY BYPASS GRAFT  10/20/2011   Procedure: CORONARY ARTERY BYPASS GRAFTING (CABG);  Surgeon: [name], MD;  Location: [place];  Service: Open Heart Surgery;  Laterality: N/A;  CABG x six;  using left internal mammary artery and right leg greater saphenous vein harvested endoscopically  . FOREIGN BODY REMOVAL  06/05/2011   Procedure: FOREIGN BODY REMOVAL ADULT;  Surgeon: Hermelinda Dellen;  Location: [place];  Service: Plastics;  Laterality: Right;  removal foreign body from right side face   . IMPLANTABLE CARDIOVERTER DEFIBRILLATOR IMPLANT N/A 06/14/2012   Procedure: IMPLANTABLE CARDIOVERTER DEFIBRILLATOR IMPLANT;  Surgeon: Evans Lance, MD;  Location[facility] CATH LAB;  Service: Cardiovascular;  Laterality: N/A;  . KNEE ARTHROSCOPY  2002   "right" (06/14/2012)  . LEFT AND RIGHT HEART CATHETERIZATION WITH CORONARY ANGIOGRAM N/A 10/16/2011   Procedure: LEFT AND RIGHT HEART CATHETERIZATION WITH CORONARY ANGIOGRAM;  Surgeon: Jolaine Artist, MD;  Location: [facility] CATH LAB;  Service: Cardiovascular;  Laterality: N/A;  . LUMBAR [place] SURGERY  2012  . MITRAL VALVE REPAIR  10/20/2011   Procedure: MITRAL VALVE REPAIR (MVR);  Surgeon: [name], MD;  Location: [place];  Service: Open Heart Surgery;  Laterality: N/A;  . POSTERIOR LUMBAR FUSION  1996  . TEE WITHOUT CARDIOVERSION  10/17/2011   Procedure: TRANSESOPHAGEAL ECHOCARDIOGRAM (TEE);  Surgeon: Jolaine Artist, MD;  Location: [facility] ENDOSCOPY;  Service: Cardiovascular;  Laterality: N/A;    Family History  Problem Relation Age of Onset  . Coronary artery  disease Mother   . Coronary artery disease Father   . Arrhythmia Brother   . Colon cancer Neg Hx     Social History   Socioeconomic History  . Marital status: Married    Spouse name: Not on file  . Number of children: Not on file  . Years of education: Not on file  . Highest education level: Not on file  Occupational History  . Not on file  Social Needs  . Financial resource strain: Not on file  . Food insecurity:    Worry: Not on file    Inability: Not on file  . Transportation needs:    Medical: Not on file    Non-medical: Not on file  Tobacco Use  . Smoking status: Current Every Day Smoker    Packs/day: 1.00    Years: 46.00    Pack years: 46.00    Types: Cigarettes    Start date: 12/12/1969  .  Smokeless tobacco: Never Used  . Tobacco comment: currently smoking 2 cigarettes per day  Substance and Sexual Activity  . 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 Drug use: No  . Sexual activity: Never    Comment: Quit drinking alcohol 2 yrs ago  Lifestyle  . Physical activity:    Days per week: Not on file    Minutes per session: Not on file  . Stress: Not on file  Relationships  . Social connections:    Talks on phone: Not on file    Gets together: Not on file    Attends religious service: Not on file    Active member of club or organization: Not on file    Attends meetings of clubs or organizations: Not on file    Relationship status: Not on file  . Intimate partner violence:    Fear of current or ex partner: Not on file    Emotionally abused: Not on file    Physically abused: Not on file    Forced sexual activity: Not on file  Other Topics Concern  . 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  Lives with wife.      ROS:  General: Negative for anorexia, weight loss, fever, chills, fatigue, weakness. Eyes: Negative for vision changes.  ENT:  Negative for hoarseness,  nasal congestion.  See HPI CV: Negative for chest pain, angina, palpitations, dyspnea on exertion, peripheral edema.  Respiratory: Negative for dyspnea at rest, dyspnea on exertion, cough, sputum, wheezing.  GI: See history of present illness. GU:  Negative for dysuria, hematuria, urinary incontinence, urinary frequency, nocturnal urination.  MS: Negative for joint pain, low back pain.  Derm: Negative for rash or itching.  Neuro: Negative for weakness, abnormal sensation, seizure, frequent headaches, memory loss, confusion.  Psych: Negative for anxiety, depression, suicidal ideation, hallucinations.  Endo: Negative for unusual weight change.  Heme: Negative for bruising or bleeding. Allergy: Negative for rash or hives.    Physical Examination:  BP 98/60   Pulse 60   Temp (!) 96.6 F (35.9 C) (Oral)   Ht 5\' 11"  (1.803 m)   Wt 202 lb 12.8 oz (92 kg)   BMI 28.28 kg/m    General: Appears older than stated age.  Accompanied by his wife.  Clinically ill-appearing but no acute distress.  Head: Normocephalic, atraumatic.   Eyes: Conjunctiva pink, no icterus. Mouth: Oropharyngeal mucosa moist and pink , no lesions erythema or exudate. Neck: Supple without thyromegaly, masses, or lymphadenopathy.  Lungs: Diminished breath sounds throughout but no rhonchi, rales, crackles.  Tattoo markings noted over the sternum heart: Regular rate and rhythm, no murmurs rubs or gallops.  Defibrillator palpated in left upper chest Abdomen: Bowel sounds are normal, nontender, nondistended, no hepatosplenomegaly or masses, no abdominal bruits, no rebound or guarding.  1-1/2 x 2 inch easily reducible nontender ventral hernia in the epigastrium Rectal: not performed Extremities: No lower extremity edema. No clubbing or deformities.  Neuro: Alert and oriented x 4 , grossly normal neurologically.  Skin: Warm and dry, no rash or jaundice.   Psych: Alert and cooperative, normal mood and  affect.  Labs: Lab Results  Component Value Date   WBC 12.7 (H) 11/03/2016   HGB 13.8 11/03/2016   HCT 39.9 11/03/2016   MCV 90.5 11/03/2016   PLT 259 11/03/2016  Imaging Studies: No results found.   Impression/plan:  62 year old gentleman with multiple comorbidities including CAD status post CABG, ICD, mitral valve repair, COPD, sleep apnea, history of radiation therapy for non-small cell lung carcinoma 2018 who presents with solid food dysphagia.  Describes vomiting with impactions previously.  Typical reflux better controlled on pantoprazole.  Cannot exclude dysphagia at least in part to prior radiation treatments although subjectively he believes these have the symptoms preceding radiation.  Plan on upper endoscopy with esophageal dilation with propofol in the near future.  Hospital has been notified of patient's ICD, sleep apnea history.  I have discussed the risks, alternatives, benefits with regards to but not limited to the risk of reaction to medication, bleeding, infection, perforation and the patient is agreeable to proceed. Written consent to be obtained.  Continue pantoprazole once daily and swallowing precautions.

## 2017-10-01 NOTE — Telephone Encounter (Signed)
Pre-op scheduled for 11/05/17 at 1:45pm. Letter mailed. LMOVM

## 2017-10-01 NOTE — Telephone Encounter (Signed)
Spouse called back and is aware of appt details 

## 2017-10-01 NOTE — Patient Instructions (Signed)
1. Upper endoscopy as scheduled.  Please see separate instructions.

## 2017-10-01 NOTE — Progress Notes (Signed)
cc'ed to pcp °

## 2017-10-02 ENCOUNTER — Encounter: Payer: Self-pay | Admitting: Cardiology

## 2017-10-11 DIAGNOSIS — I10 Essential (primary) hypertension: Secondary | ICD-10-CM | POA: Diagnosis not present

## 2017-10-11 DIAGNOSIS — G4733 Obstructive sleep apnea (adult) (pediatric): Secondary | ICD-10-CM | POA: Diagnosis not present

## 2017-10-11 DIAGNOSIS — J449 Chronic obstructive pulmonary disease, unspecified: Secondary | ICD-10-CM | POA: Diagnosis not present

## 2017-10-11 DIAGNOSIS — K210 Gastro-esophageal reflux disease with esophagitis: Secondary | ICD-10-CM | POA: Diagnosis not present

## 2017-10-30 ENCOUNTER — Other Ambulatory Visit: Payer: Self-pay | Admitting: Cardiology

## 2017-10-30 MED ORDER — CARVEDILOL 12.5 MG PO TABS: ORAL_TABLET | ORAL | 3 refills | Status: DC

## 2017-10-30 NOTE — Telephone Encounter (Signed)
Refilled coreg 12.5 mg am and 25 mg pm to [facility]

## 2017-10-30 NOTE — Telephone Encounter (Signed)
°*  STAT* If patient is at the pharmacy, call can be transferred to refill team.   1. Which medications need to be refilled? (please list name of each medication and dose if known)   carvedilol (COREG) 12.5 MG tablet [[id number]]   2. Which pharmacy/location (including street and city if local pharmacy) is medication to be sent to? [facility]  3. Do they need a 30 day or 90 day supply?  64 DAY  Pt is almost out of meds, has about 7 pills left

## 2017-10-31 NOTE — Patient Instructions (Signed)
Charles Savage  10/31/2017     @PREFPERIOPPHARMACY @   Your procedure is scheduled on  11/12/2017 .  Report to [facility] at  615   A.M.  Call this number if you have problems the morning of surgery:  [phone]   Remember:  Do not eat food or drink liquids after midnight.  Take these medicines the morning of surgery with A SIP OF WATER  Coreg, celebrex, lexapro, hydrocodone, lisinopril, protonix.   Do not wear jewelry, make-up or nail polish.  Do not wear lotions, powders, or perfumes, or deodorant.  Do not shave 48 hours prior to surgery.  Men may shave face and neck.  Do not bring valuables to the hospital.  [facility] is not responsible for any belongings or valuables.  Contacts, dentures or bridgework may not be worn into surgery.  Leave your suitcase in the car.  After surgery it may be brought to your room.  For patients admitted to the hospital, discharge time will be determined by your treatment team.  Patients discharged the day of surgery will not be allowed to drive home.   Name and phone number of your driver:   family Special instructions:  Follow the diet instructions given to you by Dr Roseanne Kaufman office.  Please read over the following fact sheets that you were given. Anesthesia Post-op Instructions and Care and Recovery After Surgery       Esophagogastroduodenoscopy Esophagogastroduodenoscopy (EGD) is a procedure to examine the lining of the esophagus, stomach, and first part of the small intestine (duodenum). This procedure is done to check for problems such as inflammation, bleeding, ulcers, or growths. During this procedure, a long, flexible, lighted tube with a camera attached (endoscope) is inserted down the throat. Tell a health care provider about:  Any allergies you have.  All medicines you are taking, including vitamins, herbs, eye drops, creams, and over-the-counter medicines.  Any problems you or family members have had  with anesthetic medicines.  Any blood disorders you have.  Any surgeries you have had.  Any medical conditions you have.  Whether you are pregnant or may be pregnant. What are the risks? Generally, this is a safe procedure. However, problems may occur, including:  Infection.  Bleeding.  A tear (perforation) in the esophagus, stomach, or duodenum.  Trouble breathing.  Excessive sweating.  Spasms of the larynx.  A slowed heartbeat.  Low blood pressure.  What happens before the procedure?  Follow instructions from your health care provider about eating or drinking restrictions.  Ask your health care provider about: ? Changing or stopping your regular medicines. This is especially important if you are taking diabetes medicines or blood thinners. ? Taking medicines such as aspirin and ibuprofen. These medicines can thin your blood. Do not take these medicines before your procedure if your health care provider instructs you not to.  Plan to have someone take you home after the procedure.  If you wear dentures, be ready to remove them before the procedure. What happens during the procedure?  To reduce your risk of infection, your health care team will wash or sanitize their hands.  An IV tube will be put in a vein in your hand or arm. You will get medicines and fluids through this tube.  You will be given one or more of the following: ? A medicine to help you relax (sedative). ? A medicine to numb the area (local anesthetic).  This medicine may be sprayed into your throat. It will make you feel more comfortable and keep you from gagging or coughing during the procedure. ? A medicine for pain.  A mouth guard may be placed in your mouth to protect your teeth and to keep you from biting on the endoscope.  You will be asked to lie on your left side.  The endoscope will be lowered down your throat into your esophagus, stomach, and duodenum.  Air will be put into the  endoscope. This will help your health care provider see better.  The lining of your esophagus, stomach, and duodenum will be examined.  Your health care provider may: ? Take a tissue sample so it can be looked at in a lab (biopsy). ? Remove growths. ? Remove objects (foreign bodies) that are stuck. ? Treat any bleeding with medicines or other devices that stop tissue from bleeding. ? Widen (dilate) or stretch narrowed areas of your esophagus and stomach.  The endoscope will be taken out. The procedure may vary among health care providers and hospitals. What happens after the procedure?  Your blood pressure, heart rate, breathing rate, and blood oxygen level will be monitored often until the medicines you were given have worn off.  Do not eat or drink anything until the numbing medicine has worn off and your gag reflex has returned. This information is not intended to replace advice given to you by your health care provider. Make sure you discuss any questions you have with your health care provider. Document Released: 10/13/2004 Document Revised: 11/18/2015 Document Reviewed: 05/06/2015 Elsevier Interactive Patient Education  2018 [employer]. Esophagogastroduodenoscopy, Care After Refer to this sheet in the next few weeks. These instructions provide you with information about caring for yourself after your procedure. Your health care provider may also give you more specific instructions. Your treatment has been planned according to current medical practices, but problems sometimes occur. Call your health care provider if you have any problems or questions after your procedure. What can I expect after the procedure? After the procedure, it is common to have:  A sore throat.  Nausea.  Bloating.  Dizziness.  Fatigue.  Follow these instructions at home:  Do not eat or drink anything until the numbing medicine (local anesthetic) has worn off and your gag reflex has returned. You  will know that the local anesthetic has worn off when you can swallow comfortably.  Do not drive for 24 hours if you received a medicine to help you relax (sedative).  If your health care provider took a tissue sample for testing during the procedure, make sure to get your test results. This is your responsibility. Ask your health care provider or the department performing the test when your results will be ready.  Keep all follow-up visits as told by your health care provider. This is important. Contact a health care provider if:  You cannot stop coughing.  You are not urinating.  You are urinating less than usual. Get help right away if:  You have trouble swallowing.  You cannot eat or drink.  You have throat or chest pain that gets worse.  You are dizzy or light-headed.  You faint.  You have nausea or vomiting.  You have chills.  You have a fever.  You have severe abdominal pain.  You have black, tarry, or bloody stools. This information is not intended to replace advice given to you by your health care provider. Make sure you discuss any questions  you have with your health care provider. Document Released: 05/29/2012 Document Revised: 11/18/2015 Document Reviewed: 05/06/2015 Elsevier Interactive Patient Education  2018 [employer].  Esophageal Dilatation Esophageal dilatation is a procedure to open a blocked or narrowed part of the esophagus. The esophagus is the long tube in your throat that carries food and liquid from your mouth to your stomach. The procedure is also called esophageal dilation. You may need this procedure if you have a buildup of scar tissue in your esophagus that makes it difficult, painful, or even impossible to swallow. This can be caused by gastroesophageal reflux disease (GERD). In rare cases, people need this procedure because they have cancer of the esophagus or a problem with the way food moves through the esophagus. Sometimes you may need to  have another dilatation to enlarge the opening of the esophagus gradually. Tell a health care provider about:  Any allergies you have.  All medicines you are taking, including vitamins, herbs, eye drops, creams, and over-the-counter medicines.  Any problems you or family members have had with anesthetic medicines.  Any blood disorders you have.  Any surgeries you have had.  Any medical conditions you have.  Any antibiotic medicines you are required to take before dental procedures. What are the risks? Generally, this is a safe procedure. However, problems can occur and include:  Bleeding from a tear in the lining of the esophagus.  A hole (perforation) in the esophagus.  What happens before the procedure?  Do not eat or drink anything after midnight on the night before the procedure or as directed by your health care provider.  Ask your health care provider about changing or stopping your regular medicines. This is especially important if you are taking diabetes medicines or blood thinners.  Plan to have someone take you home after the procedure. What happens during the procedure?  You will be given a medicine that makes you relaxed and sleepy (sedative).  A medicine may be sprayed or gargled to numb the back of the throat.  Your health care provider can use various instruments to do an esophageal dilatation. During the procedure, the instrument used will be placed in your mouth and passed down into your esophagus. Options include: ? Simple dilators. This instrument is carefully placed in the esophagus to stretch it. ? Guided wire bougies. In this method, a flexible tube (endoscope) is used to insert a wire into the esophagus. The dilator is passed over this wire to enlarge the esophagus. Then the wire is removed. ? Balloon dilators. An endoscope with a small balloon at the end is passed down into the esophagus. Inflating the balloon gently stretches the esophagus and opens it  up. What happens after the procedure?  Your blood pressure, heart rate, breathing rate, and blood oxygen level will be monitored often until the medicines you were given have worn off.  Your throat may feel slightly sore and will probably still feel numb. This will improve slowly over time.  You will not be allowed to eat or drink until the throat numbness has resolved.  If this is a same-day procedure, you may be allowed to go home once you have been able to drink, urinate, and sit on the edge of the bed without nausea or dizziness.  If this is a same-day procedure, you should have a friend or family member with you for the next 24 hours after the procedure. This information is not intended to replace advice given to you by your health  care provider. Make sure you discuss any questions you have with your health care provider. Document Released: 08/03/2005 Document Revised: 11/18/2015 Document Reviewed: 10/22/2013 Elsevier Interactive Patient Education  2018 [employer] Anesthesia is a term that refers to techniques, procedures, and medicines that help a person stay safe and comfortable during a medical procedure. Monitored anesthesia care, or sedation, is one type of anesthesia. Your anesthesia specialist may recommend sedation if you will be having a procedure that does not require you to be unconscious, such as:  Cataract surgery.  A dental procedure.  A biopsy.  A colonoscopy.  During the procedure, you may receive a medicine to help you relax (sedative). There are three levels of sedation:  Mild sedation. At this level, you may feel awake and relaxed. You will be able to follow directions.  Moderate sedation. At this level, you will be sleepy. You may not remember the procedure.  Deep sedation. At this level, you will be asleep. You will not remember the procedure.  The more medicine you are given, the deeper your level of sedation will be.  Depending on how you respond to the procedure, the anesthesia specialist may change your level of sedation or the type of anesthesia to fit your needs. An anesthesia specialist will monitor you closely during the procedure. Let your health care provider know about:  Any allergies you have.  All medicines you are taking, including vitamins, herbs, eye drops, creams, and over-the-counter medicines.  Any use of steroids (by mouth or as a cream).  Any problems you or family members have had with sedatives and anesthetic medicines.  Any blood disorders you have.  Any surgeries you have had.  Any medical conditions you have, such as sleep apnea.  Whether you are pregnant or may be pregnant.  Any use of cigarettes, alcohol, or street drugs. What are the risks? Generally, this is a safe procedure. However, problems may occur, including:  Getting too much medicine (oversedation).  Nausea.  Allergic reaction to medicines.  Trouble breathing. If this happens, a breathing tube may be used to help with breathing. It will be removed when you are awake and breathing on your own.  Heart trouble.  Lung trouble.  Before the procedure Staying hydrated Follow instructions from your health care provider about hydration, which may include:  Up to 2 hours before the procedure - you may continue to drink clear liquids, such as water, clear fruit juice, black coffee, and plain tea.  Eating and drinking restrictions Follow instructions from your health care provider about eating and drinking, which may include:  8 hours before the procedure - stop eating heavy meals or foods such as meat, fried foods, or fatty foods.  6 hours before the procedure - stop eating light meals or foods, such as toast or cereal.  6 hours before the procedure - stop drinking milk or drinks that contain milk.  2 hours before the procedure - stop drinking clear liquids.  Medicines Ask your health care provider  about:  Changing or stopping your regular medicines. This is especially important if you are taking diabetes medicines or blood thinners.  Taking medicines such as aspirin and ibuprofen. These medicines can thin your blood. Do not take these medicines before your procedure if your health care provider instructs you not to.  Tests and exams  You will have a physical exam.  You may have blood tests done to show: ? How well your kidneys and liver are  working. ? How well your blood can clot.  General instructions  Plan to have someone take you home from the hospital or clinic.  If you will be going home right after the procedure, plan to have someone with you for 24 hours.  What happens during the procedure?  Your blood pressure, heart rate, breathing, level of pain and overall condition will be monitored.  An IV tube will be inserted into one of your veins.  Your anesthesia specialist will give you medicines as needed to keep you comfortable during the procedure. This may mean changing the level of sedation.  The procedure will be performed. After the procedure  Your blood pressure, heart rate, breathing rate, and blood oxygen level will be monitored until the medicines you were given have worn off.  Do not drive for 24 hours if you received a sedative.  You may: ? Feel sleepy, clumsy, or nauseous. ? Feel forgetful about what happened after the procedure. ? Have a sore throat if you had a breathing tube during the procedure. ? Vomit. This information is not intended to replace advice given to you by your health care provider. Make sure you discuss any questions you have with your health care provider. Document Released: 03/08/2005 Document Revised: 11/19/2015 Document Reviewed: 10/03/2015 Elsevier Interactive Patient Education  2018 [place], Care After These instructions provide you with information about caring for yourself after your  procedure. Your health care provider may also give you more specific instructions. Your treatment has been planned according to current medical practices, but problems sometimes occur. Call your health care provider if you have any problems or questions after your procedure. What can I expect after the procedure? After your procedure, it is common to:  Feel sleepy for several hours.  Feel clumsy and have poor balance for several hours.  Feel forgetful about what happened after the procedure.  Have poor judgment for several hours.  Feel nauseous or vomit.  Have a sore throat if you had a breathing tube during the procedure.  Follow these instructions at home: For at least 24 hours after the procedure:   Do not: ? Participate in activities in which you could fall or become injured. ? Drive. ? Use heavy machinery. ? Drink alcohol. ? Take sleeping pills or medicines that cause drowsiness. ? Make important decisions or sign legal documents. ? Take care of children on your own.  Rest. Eating and drinking  Follow the diet that is recommended by your health care provider.  If you vomit, drink water, juice, or soup when you can drink without vomiting.  Make sure you have little or no nausea before eating solid foods. General instructions  Have a responsible adult stay with you until you are awake and alert.  Take over-the-counter and prescription medicines only as told by your health care provider.  If you smoke, do not smoke without supervision.  Keep all follow-up visits as told by your health care provider. This is important. Contact a health care provider if:  You keep feeling nauseous or you keep vomiting.  You feel light-headed.  You develop a rash.  You have a fever. Get help right away if:  You have trouble breathing. This information is not intended to replace advice given to you by your health care provider. Make sure you discuss any questions you have with  your health care provider. Document Released: 10/03/2015 Document Revised: 02/02/2016 Document Reviewed: 10/03/2015 Elsevier Interactive Patient Education  2018 [employer]  

## 2017-11-05 ENCOUNTER — Encounter (HOSPITAL_COMMUNITY)
Admission: RE | Admit: 2017-11-05 | Discharge: 2017-11-05 | Disposition: A | Discharge status: Home / Self Care | Payer: Medicare HMO | Source: Ambulatory Visit | Attending: Internal Medicine | Admitting: Internal Medicine

## 2017-11-05 ENCOUNTER — Other Ambulatory Visit: Payer: Self-pay

## 2017-11-05 ENCOUNTER — Encounter (HOSPITAL_COMMUNITY): Payer: Self-pay

## 2017-11-05 DIAGNOSIS — Z01812 Encounter for preprocedural laboratory examination: Secondary | ICD-10-CM | POA: Diagnosis not present

## 2017-11-05 LAB — CBC
HCT: 41.2 % (ref 39.0–52.0)
Hemoglobin: 13.8 g/dL (ref 13.0–17.0)
MCH: 31.5 pg (ref 26.0–34.0)
MCHC: 33.5 g/dL (ref 30.0–36.0)
MCV: 94.1 fL (ref 78.0–100.0)
PLATELETS: 342 10*3/uL (ref 150–400)
RBC: 4.38 MIL/uL (ref 4.22–5.81)
RDW: 14.9 % (ref 11.5–15.5)
WBC: 10.3 10*3/uL (ref 4.0–10.5)

## 2017-11-05 LAB — BASIC METABOLIC PANEL
ANION GAP: 8 (ref 5–15)
BUN: 13 mg/dL (ref 6–20)
CHLORIDE: 98 mmol/L — AB (ref 101–111)
CO2: 29 mmol/L (ref 22–32)
Calcium: 9.1 mg/dL (ref 8.9–10.3)
Creatinine, Ser: 1.24 mg/dL (ref 0.61–1.24)
GFR calc Af Amer: >60 mL/min (ref >60)
GFR calc non Af Amer: >60 mL/min (ref >60)
Glucose, Bld: 131 mg/dL — ABNORMAL HIGH (ref 65–99)
POTASSIUM: 4.4 mmol/L (ref 3.5–5.1)
SODIUM: 135 mmol/L (ref 135–145)

## 2017-11-05 NOTE — Pre-Procedure Instructions (Signed)
Patient presented for PAT for upcoming EGD with Dr Gala Romney.  Has described having a very dry, frequent cough since starting Trellegy Ellipta, which was thought to have been started by Dr Hilma Favors according to spouse.  [place] and was, in fact prescribed by Dr Luan Pulling.  O2 sat 87.  Awaiting delivery of CPAP machine.  Has a follow up appointment with Dr Hilma Favors this Thursday.  Dr Hilma Favors notified of above and advised that he stop inhaler and he would assess at follow up.

## 2017-11-07 ENCOUNTER — Telehealth: Payer: Self-pay | Admitting: *Deleted

## 2017-11-07 NOTE — Telephone Encounter (Signed)
RETURNED [name] PHONE CALL, SPOKE WITH [name] WIFE- PAM

## 2017-11-08 DIAGNOSIS — G4730 Sleep apnea, unspecified: Secondary | ICD-10-CM | POA: Diagnosis not present

## 2017-11-08 DIAGNOSIS — G894 Chronic pain syndrome: Secondary | ICD-10-CM | POA: Diagnosis not present

## 2017-11-08 DIAGNOSIS — Z6830 Body mass index (BMI) 30.0-30.9, adult: Secondary | ICD-10-CM | POA: Diagnosis not present

## 2017-11-08 DIAGNOSIS — Z719 Counseling, unspecified: Secondary | ICD-10-CM | POA: Diagnosis not present

## 2017-11-08 DIAGNOSIS — E6609 Other obesity due to excess calories: Secondary | ICD-10-CM | POA: Diagnosis not present

## 2017-11-08 DIAGNOSIS — Z1389 Encounter for screening for other disorder: Secondary | ICD-10-CM | POA: Diagnosis not present

## 2017-11-12 ENCOUNTER — Encounter (HOSPITAL_COMMUNITY)
Admission: RE | Disposition: A | Discharge status: Home / Self Care | Payer: Self-pay | Source: Ambulatory Visit | Attending: Internal Medicine

## 2017-11-12 ENCOUNTER — Encounter (HOSPITAL_COMMUNITY): Payer: Self-pay | Admitting: Anesthesiology

## 2017-11-12 ENCOUNTER — Ambulatory Visit (HOSPITAL_COMMUNITY): Payer: Medicare HMO | Admitting: Anesthesiology

## 2017-11-12 ENCOUNTER — Telehealth: Payer: Self-pay | Admitting: Internal Medicine

## 2017-11-12 ENCOUNTER — Other Ambulatory Visit: Payer: Self-pay | Admitting: *Deleted

## 2017-11-12 ENCOUNTER — Ambulatory Visit (HOSPITAL_COMMUNITY)
Admission: RE | Admit: 2017-11-12 | Discharge: 2017-11-12 | Disposition: A | Discharge status: Home / Self Care | Payer: Medicare HMO | Source: Ambulatory Visit | Attending: Internal Medicine | Admitting: Internal Medicine

## 2017-11-12 ENCOUNTER — Other Ambulatory Visit: Payer: Self-pay

## 2017-11-12 DIAGNOSIS — G4730 Sleep apnea, unspecified: Secondary | ICD-10-CM | POA: Diagnosis not present

## 2017-11-12 DIAGNOSIS — Z9981 Dependence on supplemental oxygen: Secondary | ICD-10-CM | POA: Diagnosis not present

## 2017-11-12 DIAGNOSIS — K219 Gastro-esophageal reflux disease without esophagitis: Secondary | ICD-10-CM | POA: Insufficient documentation

## 2017-11-12 DIAGNOSIS — R1310 Dysphagia, unspecified: Secondary | ICD-10-CM

## 2017-11-12 DIAGNOSIS — K222 Esophageal obstruction: Secondary | ICD-10-CM

## 2017-11-12 DIAGNOSIS — I509 Heart failure, unspecified: Secondary | ICD-10-CM | POA: Diagnosis not present

## 2017-11-12 DIAGNOSIS — I2510 Atherosclerotic heart disease of native coronary artery without angina pectoris: Secondary | ICD-10-CM | POA: Diagnosis not present

## 2017-11-12 DIAGNOSIS — F329 Major depressive disorder, single episode, unspecified: Secondary | ICD-10-CM | POA: Diagnosis not present

## 2017-11-12 DIAGNOSIS — F17210 Nicotine dependence, cigarettes, uncomplicated: Secondary | ICD-10-CM | POA: Insufficient documentation

## 2017-11-12 DIAGNOSIS — M1990 Unspecified osteoarthritis, unspecified site: Secondary | ICD-10-CM | POA: Diagnosis not present

## 2017-11-12 DIAGNOSIS — E785 Hyperlipidemia, unspecified: Secondary | ICD-10-CM | POA: Diagnosis not present

## 2017-11-12 DIAGNOSIS — R1314 Dysphagia, pharyngoesophageal phase: Secondary | ICD-10-CM | POA: Diagnosis not present

## 2017-11-12 DIAGNOSIS — Z79899 Other long term (current) drug therapy: Secondary | ICD-10-CM | POA: Diagnosis not present

## 2017-11-12 DIAGNOSIS — J449 Chronic obstructive pulmonary disease, unspecified: Secondary | ICD-10-CM | POA: Insufficient documentation

## 2017-11-12 DIAGNOSIS — Z7982 Long term (current) use of aspirin: Secondary | ICD-10-CM | POA: Insufficient documentation

## 2017-11-12 DIAGNOSIS — R1319 Other dysphagia: Secondary | ICD-10-CM

## 2017-11-12 DIAGNOSIS — K439 Ventral hernia without obstruction or gangrene: Secondary | ICD-10-CM

## 2017-11-12 DIAGNOSIS — Z951 Presence of aortocoronary bypass graft: Secondary | ICD-10-CM | POA: Diagnosis not present

## 2017-11-12 DIAGNOSIS — K449 Diaphragmatic hernia without obstruction or gangrene: Secondary | ICD-10-CM | POA: Diagnosis not present

## 2017-11-12 DIAGNOSIS — Z538 Procedure and treatment not carried out for other reasons: Secondary | ICD-10-CM

## 2017-11-12 DIAGNOSIS — Z8673 Personal history of transient ischemic attack (TIA), and cerebral infarction without residual deficits: Secondary | ICD-10-CM | POA: Insufficient documentation

## 2017-11-12 HISTORY — PX: ESOPHAGOGASTRODUODENOSCOPY (EGD) WITH PROPOFOL: SHX5813

## 2017-11-12 SURGERY — ESOPHAGOGASTRODUODENOSCOPY (EGD) WITH PROPOFOL
Anesthesia: General

## 2017-11-12 MED ORDER — MIDAZOLAM HCL 2 MG/2ML IJ SOLN
INTRAMUSCULAR | Status: AC
Start: 2017-11-12 — End: 2017-11-12
  Filled 2017-11-12: qty 2

## 2017-11-12 MED ORDER — PROPOFOL 10 MG/ML IV BOLUS
INTRAVENOUS | Status: AC
  Filled 2017-11-12: qty 80

## 2017-11-12 MED ORDER — LACTATED RINGERS IV SOLN: INTRAVENOUS | Status: DC

## 2017-11-12 MED ORDER — CHLORHEXIDINE GLUCONATE CLOTH 2 % EX PADS: 6.0000 | MEDICATED_PAD | Freq: Once | CUTANEOUS | Status: DC

## 2017-11-12 MED ORDER — PROPOFOL 10 MG/ML IV BOLUS
INTRAVENOUS | Status: AC
  Filled 2017-11-12: qty 20

## 2017-11-12 MED ORDER — LACTATED RINGERS IV SOLN
INTRAVENOUS | Status: DC | PRN
  Administered 2017-11-12: 07:00:00 via INTRAVENOUS

## 2017-11-12 MED ORDER — PROPOFOL 500 MG/50ML IV EMUL
INTRAVENOUS | Status: DC | PRN
  Administered 2017-11-12: 200 ug/kg/min via INTRAVENOUS

## 2017-11-12 NOTE — Telephone Encounter (Signed)
Pre-op scheduled for 12/19/17 at 9:00am. Letter mailed.

## 2017-11-12 NOTE — Anesthesia Postprocedure Evaluation (Signed)
Anesthesia Post Note  Patient: Charles Savage  Procedure(s) Performed: ESOPHAGOGASTRODUODENOSCOPY (EGD) WITH PROPOFOL (N/A )  Patient location during evaluation: PACU Anesthesia Type: MAC Level of consciousness: awake and alert and oriented Pain management: pain level controlled Vital Signs Assessment: post-procedure vital signs reviewed and stable Respiratory status: spontaneous breathing and respiratory function stable (At baseline) Cardiovascular status: blood pressure returned to baseline and stable Postop Assessment: no apparent nausea or vomiting Anesthetic complications: no     Last Vitals:  Vitals:   11/12/17 0810 11/12/17 0815  BP:  105/60  Pulse: 64 62  Resp: 15 (!) 24  Temp: 36.9 C   SpO2: 92% 93%    Last Pain:  Vitals:   11/12/17 0810  PainSc: 0-No pain                 Sosie Gato A

## 2017-11-12 NOTE — Op Note (Signed)
[facility] Patient Name: Charles Savage Procedure Date: 11/12/2017 7:35 AM MRN: [id number] Date of Birth: 04/22/1956 Attending MD: Norvel Richards , MD CSN: [id number] Age: 62 Admit Type: Outpatient Procedure:                Upper GI endoscopy Indications:              Dysphagia Providers:                Norvel Richards, MD, Jeanann Lewandowsky. Sharon Seller, RN,                            Randa Spike, Technician Referring MD:              Medicines:                Propofol per Anesthesia Complications:            No immediate complications. Estimated Blood Loss:     Estimated blood loss was minimal. Procedure:                Pre-Anesthesia Assessment:                           - Prior to the procedure, a History and Physical                            was performed, and patient medications and                            allergies were reviewed. The patient's tolerance of                            previous anesthesia was also reviewed. The risks                            and benefits of the procedure and the sedation                            options and risks were discussed with the patient.                            All questions were answered, and informed consent                            was obtained. Prior Anticoagulants: The patient has                            taken no previous anticoagulant or antiplatelet                            agents. ASA Grade Assessment: III - A patient with                            severe systemic disease. After reviewing the risks  and benefits, the patient was deemed in                            satisfactory condition to undergo the procedure.                           After obtaining informed consent, the endoscope was                            passed under direct vision. Throughout the                            procedure, the patient's blood pressure, pulse, and                            oxygen  saturations were monitored continuously. The                            EG-[id number] ([id number]) scope was introduced through the                            and advanced to the. The EG-[id number] ([id number]) scope                            was introduced through the and advanced to the body                            of the stomach. The upper GI endoscopy was                            accomplished without difficulty. The patient                            tolerated the procedure well. Scope In: 7:53:36 AM Scope Out: 7:56:00 AM Total Procedure Duration: 0 hours 2 minutes 24 seconds  Findings:      A partially obstructing Schatzki ring was found at the gastroesophageal       junction. large amount of food in the stomach precluded complete       examination. Impression:               - Obstructing Schatzki ring.                           - No specimens collected. retained gastric contents                            precluded complete examination. Continuous airway                            with deep sedation. Needs general anesthesia with                            endotracheal inesence of an empty stomach. He needs  to be on a clear liquid diet the entire day before                            procedure. Moderate Sedation:      Moderate (conscious) sedation was personally administered by an       anesthesia professional. The following parameters were monitored: oxygen       saturation, heart rate, blood pressure, respiratory rate, EKG, adequacy       of pulmonary ventilation, and response to care. Total physician       intraservice time was 21 minutes. Recommendation:           - Patient has a contact number available for                            emergencies. The signs and symptoms of potential                            delayed complications were discussed with the                            patient. Return to normal activities tomorrow.                             Written discharge instructions were provided to the                            patient.                           - Advance diet as tolerated. Continue Protonix 40                            mg daily. He'll need to be rescheduled for EGD with                            esophageal dilation under general anesthesia. Needs                            a clear liquid diet the day before the procedure. Procedure Code(s):        --- Professional ---                           [phone], 52, Esophagogastroduodenoscopy, flexible,                            transoral; diagnostic, including collection of                            specimen(s) by brushing or washing, when performed                            (separate procedure) Diagnosis Code(s):        --- Professional ---  K22.2, Esophageal obstruction                           R13.10, Dysphagia, unspecified CPT copyright 2017 American Medical Association. All rights reserved. The codes documented in this report are preliminary and upon coder review may  be revised to meet current compliance requirements. Cristopher Estimable. Ashar Lewinski, MD Norvel Richards, MD 11/12/2017 8:09:38 AM This report has been signed electronically. Number of Addenda: 0

## 2017-11-12 NOTE — Transfer of Care (Signed)
Immediate Anesthesia Transfer of Care Note  Patient: Charles Savage Hammersmith  Procedure(s) Performed: ESOPHAGOGASTRODUODENOSCOPY (EGD) WITH PROPOFOL (N/A )  Patient Location: PACU  Anesthesia Type:MAC  Level of Consciousness: awake, alert , oriented and patient cooperative  Airway & Oxygen Therapy: Patient Spontanous Breathing and Patient connected to nasal cannula oxygen  Post-op Assessment: Report given to RN and Post -op Vital signs reviewed and stable  Post vital signs: Reviewed and stable  Last Vitals:  Vitals Value Taken Time  BP 90/48 11/12/2017  8:06 AM  Temp    Pulse 64 11/12/2017  8:10 AM  Resp 28 11/12/2017  8:10 AM  SpO2 88 % 11/12/2017  8:10 AM  Vitals shown include unvalidated device data.  Last Pain:  Vitals:   11/12/17 0735  PainSc: 6          Complications: No apparent anesthesia complications

## 2017-11-12 NOTE — Anesthesia Preprocedure Evaluation (Signed)
Anesthesia Evaluation  Patient identified by MRN, date of birth, ID band Patient awake    Reviewed: Allergy & Precautions, H&P , NPO status , [name] Chart, lab work & pertinent test results, reviewed documented beta blocker date and time   Airway Mallampati: III  TM Distance: >3 FB Neck ROM: full    Dental no notable dental hx.    Pulmonary neg pulmonary ROS, sleep apnea , pneumonia, COPD, Current Smoker,    Pulmonary exam normal breath sounds clear to auscultation       Cardiovascular Exercise Tolerance: Good + CAD, + Past MI, + CABG and +CHF  negative cardio ROS  + Cardiac Defibrillator  Rhythm:regular Rate:Normal     Neuro/Psych Depression CVA negative neurological ROS  negative psych ROS   GI/Hepatic negative GI ROS, Neg liver ROS, GERD  ,  Endo/Other  negative endocrine ROS  Renal/GU negative Renal ROS  negative genitourinary   Musculoskeletal negative musculoskeletal ROS (+) Arthritis ,   Abdominal   Peds negative pediatric ROS (+)  Hematology negative hematology ROS (+)   Anesthesia Other Findings   Reproductive/Obstetrics negative OB ROS                             Anesthesia Physical Anesthesia Plan  ASA: IV  Anesthesia Plan: General   Post-op Pain Management:    Induction:   PONV Risk Score and Plan:   Airway Management Planned:   Additional Equipment:   Intra-op Plan:   Post-operative Plan:   Informed Consent: I have reviewed the patients History and Physical, chart, labs and discussed the procedure including the risks, benefits and alternatives for the proposed anesthesia with the patient or authorized representative who has indicated his/her understanding and acceptance.   Dental Advisory Given  Plan Discussed with: CRNA  Anesthesia Plan Comments:         Anesthesia Quick Evaluation

## 2017-11-12 NOTE — Discharge Instructions (Signed)
EGD Discharge instructions Please read the instructions outlined below and refer to this sheet in the next few weeks. These discharge instructions provide you with general information on caring for yourself after you leave the hospital. Your doctor may also give you specific instructions. While your treatment has been planned according to the most current medical practices available, unavoidable complications occasionally occur. If you have any problems or questions after discharge, please call your doctor. ACTIVITY  You may resume your regular activity but move at a slower pace for the next 24 hours.   Take frequent rest periods for the next 24 hours.   Walking will help expel (get rid of) the air and reduce the bloated feeling in your abdomen.   No driving for 24 hours (because of the anesthesia (medicine) used during the test).   You may shower.   Do not sign any important legal documents or operate any machinery for 24 hours (because of the anesthesia used during the test).  NUTRITION  Drink plenty of fluids.   You may resume your normal diet.   Begin with a light meal and progress to your normal diet.   Avoid alcoholic beverages for 24 hours or as instructed by your caregiver.  MEDICATIONS  You may resume your normal medications unless your caregiver tells you otherwise.  WHAT YOU CAN EXPECT TODAY  You may experience abdominal discomfort such as a feeling of fullness or gas pains.  FOLLOW-UP  Your doctor will discuss the results of your test with you.  SEEK IMMEDIATE MEDICAL ATTENTION IF ANY OF THE FOLLOWING OCCUR:  Excessive nausea (feeling sick to your stomach) and/or vomiting.   Severe abdominal pain and distention (swelling).   Trouble swallowing.   Temperature over 101 F (37.8 C).   Rectal bleeding or vomiting of blood.    You had food in your stomach which precluded complete examination. Also, you had a poor airway for procedure.  We will need to  reschedule and perform the EGD and esophageal dilation under general anesthesia in the near future  Will reschedule the procedure   ****Office notified*****    PATIENT INSTRUCTIONS POST-ANESTHESIA  IMMEDIATELY FOLLOWING SURGERY:  Do not drive or operate machinery for the first twenty four hours after surgery.  Do not make any important decisions for twenty four hours after surgery or while taking narcotic pain medications or sedatives.  If you develop intractable nausea and vomiting or a severe headache please notify your doctor immediately.  FOLLOW-UP:  Please make an appointment with your surgeon as instructed. You do not need to follow up with anesthesia unless specifically instructed to do so.  WOUND CARE INSTRUCTIONS (if applicable):  Keep a dry clean dressing on the anesthesia/puncture wound site if there is drainage.  Once the wound has quit draining you may leave it open to air.  Generally you should leave the bandage intact for twenty four hours unless there is drainage.  If the epidural site drains for more than 36-48 hours please call the anesthesia department.  QUESTIONS?:  Please feel free to call your physician or the hospital operator if you have any questions, and they will be happy to assist you.

## 2017-11-12 NOTE — H&P (Signed)
@LOGO @   Primary Care Physician:  Sharilyn Sites, MD Primary Gastroenterologist:  Dr. Gala Romney  Pre-Procedure History & Physical: HPI:  Charles Savage is a 62 y.o. male here for further evaluation of esophageal dysphagia. EGD. He has an ICD.  Past Medical History:  Diagnosis Date  . 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 Arthritis    "back; knees" (06/14/2012)  . Cerebrovascular disease    h/o CVA in 07/2011 + left carotid endarterectomy  . CHF (congestive heart failure) ([place])   . Cholelithiasis 07/05/2012   Asymptomatic; identified on CT scanning of the chest;  . Chronic lower back pain   . COPD (chronic obstructive pulmonary disease) ([place])    on 2L O2 at home since 08/2011  . Coronary artery disease   . DDD (degenerative disc disease), lumbosacral   . Depression 09/2011   "after OHS" (06/14/2012)  . Hyperlipemia   . ICD (implantable cardiac defibrillator) in place   . Lung cancer ([place]) 2018   Radiation treatment  . Myocardial infarction ([place]) 08/2011  . Other primary cardiomyopathies   . Pneumonia   . Sleep apnea   . Stroke [facility]) 07/2011   denies residual (06/14/2012)    Past Surgical History:  Procedure Laterality Date  . BACK SURGERY    . CARDIAC CATHETERIZATION  09/2011  . CARDIAC DEFIBRILLATOR PLACEMENT  06/14/2012  . CAROTID ENDARTERECTOMY  07/2011   "left" (06/14/2012)  . COLONOSCOPY N/A 10/16/2014   Dr. Gala Romney: rectal polyps removed (hyperplastic). next TCS 10 years.   . CORONARY ARTERY BYPASS GRAFT  10/20/2011   Procedure: CORONARY ARTERY BYPASS GRAFTING (CABG);  Surgeon: [name], MD;  Location: [place];  Service: Open Heart Surgery;  Laterality: N/A;  CABG x six;  using left internal mammary artery and right leg greater saphenous vein harvested endoscopically  . FOREIGN BODY REMOVAL  06/05/2011   Procedure: FOREIGN BODY REMOVAL ADULT;  Surgeon: Hermelinda Dellen;  Location: [place];   Service: Plastics;  Laterality: Right;  removal foreign body from right side face   . IMPLANTABLE CARDIOVERTER DEFIBRILLATOR IMPLANT N/A 06/14/2012   Procedure: IMPLANTABLE CARDIOVERTER DEFIBRILLATOR IMPLANT;  Surgeon: Evans Lance, MD;  Location: [facility] CATH LAB;  Service: Cardiovascular;  Laterality: N/A;  . KNEE ARTHROSCOPY  2002   "right" (06/14/2012)  . LEFT AND RIGHT HEART CATHETERIZATION WITH CORONARY ANGIOGRAM N/A 10/16/2011   Procedure: LEFT AND RIGHT HEART CATHETERIZATION WITH CORONARY ANGIOGRAM;  Surgeon: Jolaine Artist, MD;  Location: [facility] CATH LAB;  Service: Cardiovascular;  Laterality: N/A;  . LUMBAR [place] SURGERY  2012  . MITRAL VALVE REPAIR  10/20/2011   Procedure: MITRAL VALVE REPAIR (MVR);  Surgeon: [name], MD;  Location: [place];  Service: Open Heart Surgery;  Laterality: N/A;  . POSTERIOR LUMBAR FUSION  1996  . TEE WITHOUT CARDIOVERSION  10/17/2011   Procedure: TRANSESOPHAGEAL ECHOCARDIOGRAM (TEE);  Surgeon: Jolaine Artist, MD;  Location[facility] ENDOSCOPY;  Service: Cardiovascular;  Laterality: N/A;    Prior to Admission medications   Medication Sig Start Date End Date Taking? Authorizing Provider  aspirin 81 MG tablet Take 81 mg by mouth daily.   Yes [provider]  atorvastatin (LIPITOR) 80 MG tablet TAKE ONE (1) TABLET EACH DAY Patient taking differently: Take 80 mg by mouth daily.  07/10/17  Yes BranchAlphonse Guild, MD  carvedilol (COREG) 12.5 MG tablet Take 1-2 tablets (12.5-25 mg total) by mouth See admin instructions.  Take 12.5 mg am and 25 mg ( 2 tablets) in the pm 10/31/17  Yes Branch, Alphonse Guild, MD  celecoxib (CELEBREX) 200 MG capsule Take 200 mg by mouth daily.  01/04/17  Yes [provider]  escitalopram (LEXAPRO) 20 MG tablet Take 20 mg by mouth daily.   Yes [provider]  Fluticasone-Umeclidin-Vilant (TRELEGY ELLIPTA) 100-62.5-25 MCG/INH AEPB Inhale 1 puff into the lungs daily.    Yes [provider]   HYDROcodone-acetaminophen (NORCO) 10-325 MG per tablet Take 1 tablet by mouth every 8 (eight) hours as needed. Pain Patient taking differently: Take 1 tablet by mouth every 4 (four) hours as needed for moderate pain.  11/03/11  Yes Lendon Colonel, NP  levocetirizine (XYZAL) 5 MG tablet Take 5 mg by mouth at bedtime as needed for allergies.   Yes [provider]  lisinopril (PRINIVIL,ZESTRIL) 5 MG tablet Take 1 tablet (5 mg total) by mouth daily. 07/12/17  Yes BranchAlphonse Guild, MD  pantoprazole (PROTONIX) 40 MG tablet Take 40 mg by mouth daily as needed (for acid reflux).    Yes [provider]  EPIPEN 2-PAK 0.3 MG/0.3ML SOAJ injection Inject 0.3 mg as directed once.  05/19/13   [provider]  tiotropium (SPIRIVA HANDIHALER) 18 MCG inhalation capsule Place 1 capsule (18 mcg total) into inhaler and inhale daily. Patient not taking: Reported on 10/31/2017 09/07/15   Arnoldo Lenis, MD    Allergies as of 10/01/2017 - Review Complete 10/01/2017  Allergen Reaction Noted  . Metoprolol Shortness Of Breath and Nausea And Vomiting 10/09/2011  . Peach flavor Hives and Itching 10/09/2011  . Morphine and related Hives and Itching 09/02/2010  . Bee venom Swelling 11/10/2016    Family History  Problem Relation Age of Onset  . Coronary artery disease Mother   . Coronary artery disease Father   . Arrhythmia Brother   . Colon cancer Neg Hx     Social History   Socioeconomic History  . Marital status: Married    Spouse name: Not on file  . Number of children: Not on file  . Years of education: Not on file  . Highest education level: Not on file  Occupational History  . Not on file  Social Needs  . Financial resource strain: Not on file  . Food insecurity:    Worry: Not on file    Inability: Not on file  . Transportation needs:    Medical: Not on file    Non-medical: Not on file  Tobacco Use  . Smoking status: Current Every Day Smoker    Packs/day: 1.00     Years: 46.00    Pack years: 46.00    Types: Cigarettes    Start date: 12/12/1969  . Smokeless tobacco: Never Used  . Tobacco comment: currently smoking 2 cigarettes per day  Substance and Sexual Activity  . 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 Drug use: No  . Sexual activity: Never    Comment: Quit drinking alcohol 2 yrs ago  Lifestyle  . Physical activity:    Days per week: Not on file    Minutes per session: Not on file  . Stress: Not on file  Relationships  . Social connections:    Talks on phone: Not on file    Gets together: Not on file    Attends religious service: Not on file  Active member of club or organization: Not on file    Attends meetings of clubs or organizations: Not on file    Relationship status: Not on file  . Intimate partner violence:    Fear of current or ex partner: Not on file    Emotionally abused: Not on file    Physically abused: Not on file    Forced sexual activity: Not on file  Other Topics Concern  . 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  Lives with wife.    Review of Systems: See HPI, otherwise negative ROS  Physical Exam: BP 131/67   Pulse (!) 55   Resp 15   SpO2 94%  General:   Alert,   pleasant and cooperative in NAD Skin:  Intact without significant lesions or rashes. Neck:  Supple; no masses or thyromegaly. No significant cervical adenopathy. Lungs:  Clear throughout to auscultation.   No wheezes, crackles, or rhonchi. No acute distress. Heart:  Regular rate and rhythm; no murmurs, clicks, rubs,  or gallops. Abdomen: Non-distended, normal bowel sounds.  Soft and nontender without appreciable mass or hepatosplenomegaly.  Pulses:  Normal pulses noted. Extremities:  Without clubbing or edema.  Impression:  Pleasant 62 year old gentleman with esophageal dysphagia. Needs EGD with possible ED to further  evaluate..   Recommendations:  I have offered the patient and EGD with esophageal dilation as feasible/appropriate per plan.  The risks, benefits, limitations, alternatives and imponderables have been reviewed with the patient. Potential for esophageal dilation, biopsy, etc. have also been reviewed.  Questions have been answered. All parties agreeable.       Notice: This dictation was prepared with Dragon dictation along with smaller phrase technology. Any transcriptional errors that result from this process are unintentional and may not be corrected upon review.

## 2017-11-12 NOTE — Telephone Encounter (Signed)
Christine from [employer] called to say that patient had food in his stomach and RMR wants him rescheduled for another EGD with Dilation.

## 2017-11-12 NOTE — Telephone Encounter (Signed)
Spoke with spouse and procedure scheduled for 12/24/17 at 1:00pm. New instructions mailed. Aware will call back with pre-op appt.

## 2017-11-12 NOTE — Anesthesia Procedure Notes (Signed)
Procedure Name: MAC Date/Time: 11/12/2017 7:30 AM Performed by: Andree Elk Amy A, CRNA Pre-anesthesia Checklist: Patient identified, Emergency Drugs available, Suction available, Patient being monitored and Timeout performed Oxygen Delivery Method: Nasal cannula

## 2017-11-15 ENCOUNTER — Telehealth: Payer: Self-pay | Admitting: *Deleted

## 2017-11-15 ENCOUNTER — Encounter (HOSPITAL_COMMUNITY): Payer: Self-pay | Admitting: Internal Medicine

## 2017-11-15 NOTE — Telephone Encounter (Signed)
CALLED PATIENT TO INFORM OF STAT LAB ON 11-28-17 - @ 9:45 AM @ [place], PT. TO HAVE CT AFTERWARDS, - ARRIVAL TIME - 10:15 AM @ [place], PT. TO BE NPO- 4 HRS. PRIOR TO TEST, PT. TO GET RESULTS FROM ASHLYN BRUNING ON 11-29-17 @ 10:30 AM, SPOKE WITH [name] WIFE - PAM AND SHE IS AWARE OF THESE APPTS.

## 2017-11-21 NOTE — Progress Notes (Addendum)
Omari V. Gray 62 y.o. man with stage IA, cT1bN0M0, NSCLC, favoringadenocarcinoma of the right lower lobe radiation completed 12-22-16, review 11-28-17 CT chest w contrast, FU.   Weight changes, if any:. Wt Readings from Last 3 Encounters:  11/29/17 201 lb 9.6 oz (91.4 kg)  11/05/17 202 lb (91.6 kg)  10/01/17 202 lb 12.8 oz (92 kg)   Respiratory complaints, if any:  Coughing non productive stopped Lisinopril and  Hemoptysis, if any: No  Swallowing Problems/Pain/Difficulty swallowing: Having some problems swallowing while eating and drinking ; will have a endoscopy 12-24-17 Dr. Sterling Big thinks his esophagus may need to be dilated. Appetite :Good Pain: 6/10 taking Hydrocodone Fatigue: Yes some of the time. BP 131/65 (BP Location: Right Arm, Patient Position: Sitting, Cuff Size: Large)   Pulse 68   Temp 97.9 F (36.6 C) (Oral)   Resp 20   Ht 5\' 11"  (1.803 m)   Wt 201 lb 9.6 oz (91.4 kg)   SpO2 96%   BMI 28.12 kg/m

## 2017-11-28 ENCOUNTER — Ambulatory Visit (HOSPITAL_COMMUNITY)
Admission: RE | Admit: 2017-11-28 | Discharge: 2017-11-28 | Disposition: A | Discharge status: Home / Self Care | Payer: Medicare HMO | Source: Ambulatory Visit | Attending: Urology | Admitting: Urology

## 2017-11-28 DIAGNOSIS — Z923 Personal history of irradiation: Secondary | ICD-10-CM | POA: Diagnosis not present

## 2017-11-28 DIAGNOSIS — J439 Emphysema, unspecified: Secondary | ICD-10-CM | POA: Diagnosis not present

## 2017-11-28 DIAGNOSIS — C3490 Malignant neoplasm of unspecified part of unspecified bronchus or lung: Secondary | ICD-10-CM | POA: Diagnosis not present

## 2017-11-28 DIAGNOSIS — J432 Centrilobular emphysema: Secondary | ICD-10-CM | POA: Insufficient documentation

## 2017-11-28 DIAGNOSIS — Z951 Presence of aortocoronary bypass graft: Secondary | ICD-10-CM | POA: Insufficient documentation

## 2017-11-28 DIAGNOSIS — C3431 Malignant neoplasm of lower lobe, right bronchus or lung: Secondary | ICD-10-CM | POA: Diagnosis not present

## 2017-11-28 DIAGNOSIS — I700 Atherosclerosis of aorta: Secondary | ICD-10-CM | POA: Insufficient documentation

## 2017-11-28 MED ORDER — IOHEXOL 300 MG/ML  SOLN
75.0000 mL | Freq: Once | INTRAMUSCULAR | Status: AC | PRN
  Administered 2017-11-28: 75 mL via INTRAVENOUS

## 2017-11-28 MED ORDER — IOPAMIDOL (ISOVUE-300) INJECTION 61%: 75.0000 mL | Freq: Once | INTRAVENOUS | Status: DC | PRN

## 2017-11-29 ENCOUNTER — Ambulatory Visit
Admission: RE | Admit: 2017-11-29 | Discharge: 2017-11-29 | Disposition: A | Discharge status: Home / Self Care | Payer: Medicare HMO | Source: Ambulatory Visit | Attending: Urology | Admitting: Urology

## 2017-11-29 ENCOUNTER — Encounter: Payer: Self-pay | Admitting: Urology

## 2017-11-29 VITALS — BP 131/65 | HR 68 | Temp 97.9°F | Resp 20 | Ht 71.0 in | Wt 201.6 lb

## 2017-11-29 DIAGNOSIS — F17210 Nicotine dependence, cigarettes, uncomplicated: Secondary | ICD-10-CM | POA: Diagnosis not present

## 2017-11-29 DIAGNOSIS — C3431 Malignant neoplasm of lower lobe, right bronchus or lung: Secondary | ICD-10-CM | POA: Diagnosis not present

## 2017-11-29 DIAGNOSIS — I700 Atherosclerosis of aorta: Secondary | ICD-10-CM | POA: Diagnosis not present

## 2017-11-29 DIAGNOSIS — Z79899 Other long term (current) drug therapy: Secondary | ICD-10-CM | POA: Insufficient documentation

## 2017-11-29 DIAGNOSIS — Z7982 Long term (current) use of aspirin: Secondary | ICD-10-CM | POA: Insufficient documentation

## 2017-11-29 DIAGNOSIS — J439 Emphysema, unspecified: Secondary | ICD-10-CM | POA: Insufficient documentation

## 2017-11-29 DIAGNOSIS — Z923 Personal history of irradiation: Secondary | ICD-10-CM | POA: Insufficient documentation

## 2017-11-29 DIAGNOSIS — Z08 Encounter for follow-up examination after completed treatment for malignant neoplasm: Secondary | ICD-10-CM | POA: Diagnosis not present

## 2017-11-29 NOTE — Progress Notes (Signed)
Radiation Oncology         [phone] ________________________________  Name: VOYD GROFT MRN: [id number]  Date: 11/29/2017  DOB: 03-21-1956  Post Treatment Note  CC: Sharilyn Sites, MD  [name], MD  Diagnosis:   62 y.o. 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  Interval Since Last Radiation: 11 months; Curative, Definitive SBRT  12/13/16 - 12/22/16:  The right lower lung was treated to 50 Gy in 5 fractions of 10 Gy  Narrative:  The patient returns today for routine follow-up and to review results from his recent follow up CT Chest obtained on 11/28/17 which shows an excellent response to radiotherapy with the previously treated RLL lesion no longer visualized and no evidence of disease recurrence or metastasis.                On review of systems, the patient states that he is doing well in general. He specifically denies hemoptysis, increased cough or shortness of breath, fevers, chills or night sweats. He reports a healthy appetite and is maintaining his weight. He denies abdominal pain, nausea, vomiting or diarrhea. He reports that his energy level is fair.  He has not had any further hypoglycemic episodes where he breaks out into a sweat and feels lightheaded. He has started eating breakfast and making sure that he does not skip meals or go long periods of time between eating which he thinks has helped resolve this issue.  He had a recent EGD on 11/12/17 wth Dr. Gala Romney and was found to have a partially obstructing Shatzki ring contributing to his chronic dysphagia.  He is scheduled for repeat EGD with esophageal dilatation on 12/24/17.  ALLERGIES:  is allergic to metoprolol; peach flavor; morphine and related; and bee venom.  Meds: Current Outpatient Medications  Medication Sig Dispense Refill  . aspirin 81 MG tablet Take 81 mg by mouth daily.    Marland Kitchen atorvastatin (LIPITOR) 80 MG tablet TAKE ONE (1) TABLET EACH DAY (Patient taking  differently: Take 80 mg by mouth daily. ) 90 tablet 3  . carvedilol (COREG) 12.5 MG tablet Take 1-2 tablets (12.5-25 mg total) by mouth See admin instructions. Take 12.5 mg am and 25 mg ( 2 tablets) in the pm 270 tablet 3  . celecoxib (CELEBREX) 200 MG capsule Take 200 mg by mouth daily.     Marland Kitchen escitalopram (LEXAPRO) 20 MG tablet Take 20 mg by mouth daily.    Marland Kitchen HYDROcodone-acetaminophen (NORCO) 10-325 MG per tablet Take 1 tablet by mouth every 8 (eight) hours as needed. Pain (Patient taking differently: Take 1 tablet by mouth every 4 (four) hours as needed for moderate pain. ) 21 tablet 0  . levocetirizine (XYZAL) 5 MG tablet Take 5 mg by mouth at bedtime as needed for allergies.    . pantoprazole (PROTONIX) 40 MG tablet Take 40 mg by mouth daily as needed (for acid reflux).     Marland Kitchen EPIPEN 2-PAK 0.3 MG/0.3ML SOAJ injection Inject 0.3 mg as directed once.      No current facility-administered medications for this encounter.     Physical Findings:  height is 5\' 11"  (1.803 m) and weight is 201 lb 9.6 oz (91.4 kg). His oral temperature is 97.9 F (36.6 C). His blood pressure is 131/65 and his pulse is 68. His respiration is 20 and oxygen saturation is 96%.  Pain Assessment Pain Score: 6  Pain Loc: Back(Back and hips)/10 In general this is a well  appearing Caucasian male in no acute distress. He's alert and oriented x4 and appropriate throughout the examination. Cardiopulmonary assessment is negative for acute distress and he exhibits normal effort. Lungs are clear to ausculation bilaterally. Cardiac exam with RRR, no murmurs, rubs or gallops.  Lab Findings: Lab Results  Component Value Date   WBC 10.3 11/05/2017   HGB 13.8 11/05/2017   HCT 41.2 11/05/2017   MCV 94.1 11/05/2017   PLT 342 11/05/2017     Radiographic Findings: Ct Chest W Contrast  Result Date: 11/28/2017 CLINICAL DATA:  Follow-up lung cancer.  Status post chemotherapy. EXAM: CT CHEST WITH CONTRAST TECHNIQUE: Multidetector CT  imaging of the chest was performed during intravenous contrast administration. CONTRAST:  <See Chart> ISOVUE-300 IOPAMIDOL (ISOVUE-300) INJECTION 61%, 42mL OMNIPAQUE IOHEXOL 300 MG/ML SOLN COMPARISON:  05/29/2017 FINDINGS: Cardiovascular: The heart size is normal. Previous median sternotomy and CABG procedure. Aortic atherosclerosis. No pericardial effusion. Mediastinum/Nodes: The thyroid gland appears normal. The trachea appears patent and is midline. Normal appearance of the esophagus. No mediastinal adenopathy. Stable right hilar node measuring 1 cm, image 91/2. No axillary or supraclavicular adenopathy. Lungs/Pleura: Moderate to advanced changes of centrilobular and paraseptal emphysema. Diffuse bronchial wall thickening noted. Pleural thickening and loculated right pleural fluid is stable from the previous exam. Fibrosis and masslike architectural distortion within the right lower lobe is similar to the comparison exam. This is favored to reflect changes secondary to external beam radiation. The underlying lung lesion identified on 09/29/2016 is no longer visible. 5 mm right middle lobe lung nodule is unchanged, image 83/4. Calcified granuloma is again noted within the anterior left upper lobe, image 76/4. Peripheral nodule within the left upper lobe is unchanged measuring 4 mm, image 59/4. Upper Abdomen: Calcified granulomas identified within the liver. No acute abnormality noted. Musculoskeletal: Spondylosis identified within the thoracic spine. No aggressive lytic or sclerotic bone lesions. IMPRESSION: 1. Stable exam. No findings to suggest recurrent or metastatic disease. 2. Similar appearance of changes due to external beam radiation within the right lower lobe. 3. Diffuse bronchial wall thickening with emphysema, as above; imaging findings suggestive of underlying COPD. 4. Aortic atherosclerosis.  Status post CABG procedure. 5. Aortic Atherosclerosis (ICD10-I70.0) and Emphysema (ICD10-J43.9).  Electronically Signed   By: Kerby Moors M.D.   On: 11/28/2017 14:55    Impression/Plan: 1. 62 y.o. 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 Recent CT Chest imaging reveals no evidence of recurrent or metastatic disease. There are evolutionary post-treatment changes in the right lower lobe but the previously measured right lower lobe lesion is no longer visualized.  We will continue with serial  CT imaging of the chest with contrast at six-month intervals until 5 years when, at that point in time, he will have an annual low dose CT scan for surveillance. He will return in 6 months to review the findings from the scan to be ordered prior to that visit.  He prefers to have his labs (BMP) and imaging at [facility] since this is much closer to his home.   2. Tobacco abuse: The patient continues to smoke less than half a pack per day. The patient is counseled on the importance of smoking cessation but is still not ready to commit to quitting.  We will continue to discuss this further at subsequent visits.    Nicholos Johns, PA-C

## 2017-11-29 NOTE — Addendum Note (Signed)
Encounter addended by: Malena Edman, RN on: 11/29/2017 11:03 AM  Actions taken: Charge Capture section accepted

## 2017-12-06 DIAGNOSIS — G4730 Sleep apnea, unspecified: Secondary | ICD-10-CM | POA: Diagnosis not present

## 2017-12-10 ENCOUNTER — Ambulatory Visit (INDEPENDENT_AMBULATORY_CARE_PROVIDER_SITE_OTHER): Payer: Medicare HMO | Admitting: *Deleted

## 2017-12-10 DIAGNOSIS — I255 Ischemic cardiomyopathy: Secondary | ICD-10-CM | POA: Diagnosis not present

## 2017-12-10 NOTE — Progress Notes (Signed)
Remote ICD transmission.   

## 2017-12-11 LAB — CUP PACEART REMOTE DEVICE CHECK
Battery Remaining Longevity: 85 mo
Battery Voltage: 3.01 V
HIGH POWER IMPEDANCE MEASURED VALUE: 75 Ohm
Implantable Lead Model: 6935
Implantable Pulse Generator Implant Date: 20131220
Lead Channel Impedance Value: 399 Ohm
Lead Channel Pacing Threshold Pulse Width: 0.4 ms
Lead Channel Sensing Intrinsic Amplitude: 6.25 mV
Lead Channel Setting Pacing Amplitude: 2 V
Lead Channel Setting Pacing Pulse Width: 0.4 ms
Lead Channel Setting Sensing Sensitivity: 0.3 mV
MDC IDC LEAD IMPLANT DT: 20131220
MDC IDC LEAD LOCATION: 753860
MDC IDC MSMT LEADCHNL RV IMPEDANCE VALUE: 475 Ohm
MDC IDC MSMT LEADCHNL RV PACING THRESHOLD AMPLITUDE: 0.75 V
MDC IDC MSMT LEADCHNL RV SENSING INTR AMPL: 6.25 mV
MDC IDC SESS DTM: 20190617052208
MDC IDC STAT BRADY RV PERCENT PACED: 0.01 %

## 2017-12-12 IMAGING — CT CT CHEST W/ CM
2 of 3 series · 15 of 36 positions shown, 18 images · IV contrast (iopamidol)
Comparison: 02/13/2017.

CLINICAL DATA: Small cell lung cancer.  Radiation therapy.

EXAM:
CT CHEST WITH CONTRAST
TECHNIQUE: Multidetector CT imaging of the chest was performed during
intravenous contrast administration.
CONTRAST:  75mL 6YV0NB-1SS IOPAMIDOL (6YV0NB-1SS) INJECTION 61%

[Series 2: axial st · axial · 0.71mm/px · z∈[-324,-46]mm · 12 of 165 slices shown, 15 images]
[im 13/165  mediastinal]
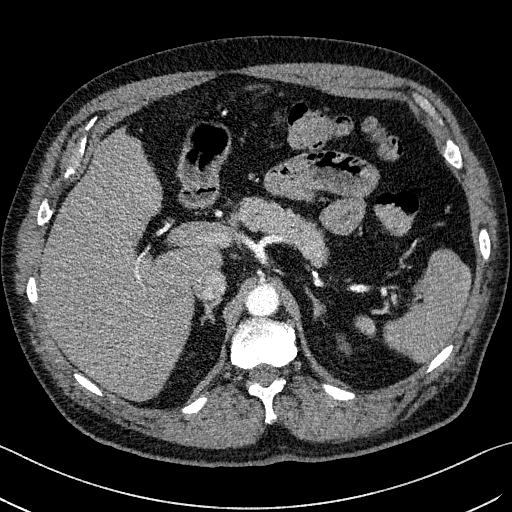
[im 13/165  lung]
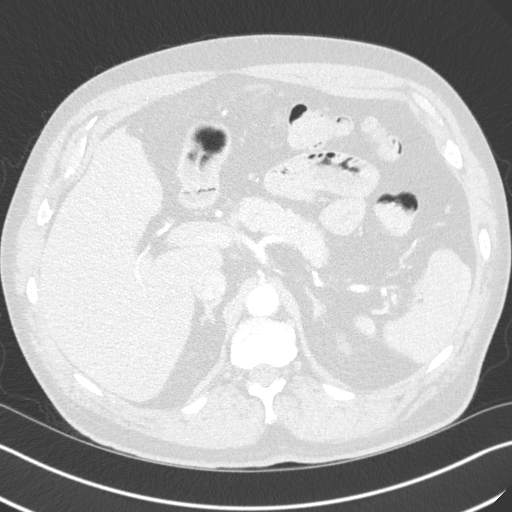
[im 25/165  lung]
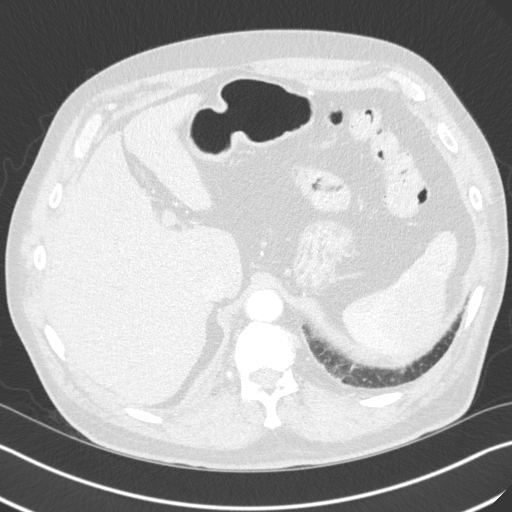
[im 37/165  lung]
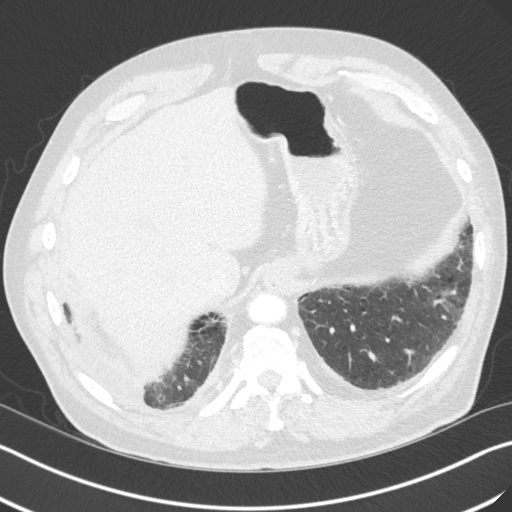
[im 49/165  lung]
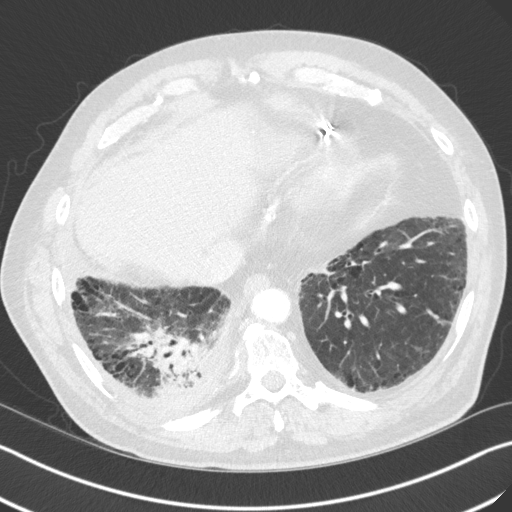
[im 61/165  mediastinal]
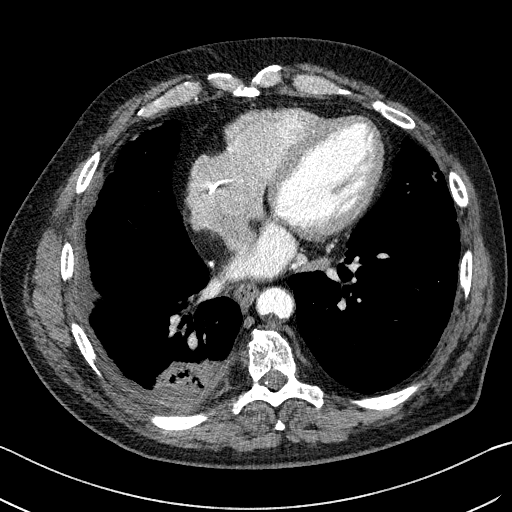
[im 61/165  lung]
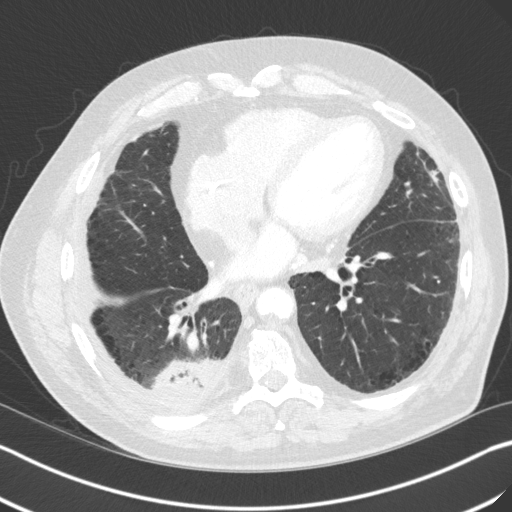
[im 73/165  lung]
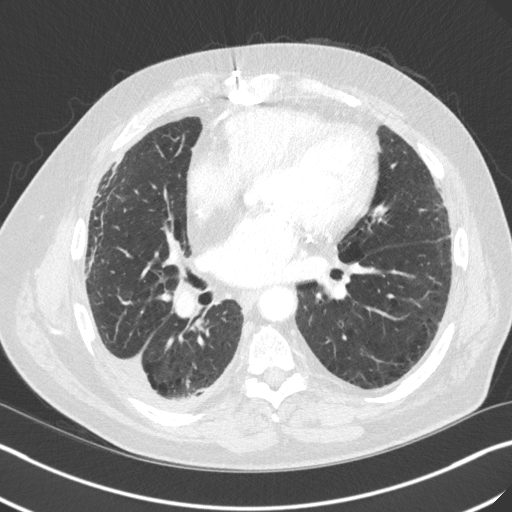
[im 92/165  lung]
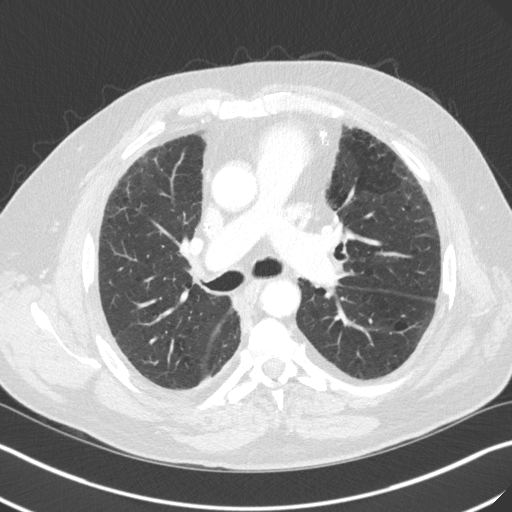
[im 104/165  lung]
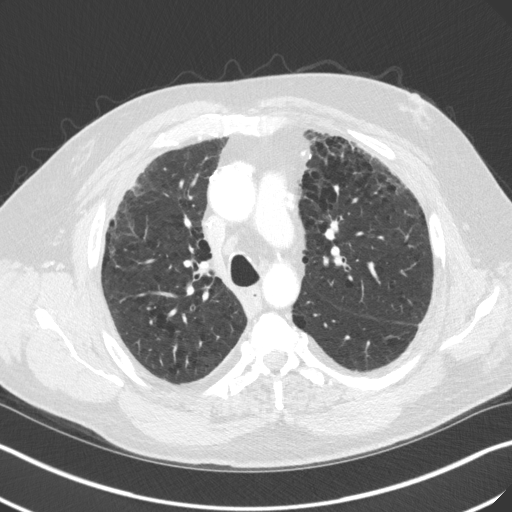
[im 116/165  mediastinal]
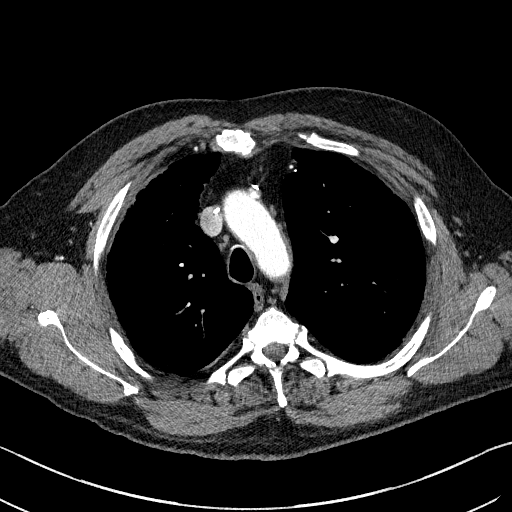
[im 116/165  lung]
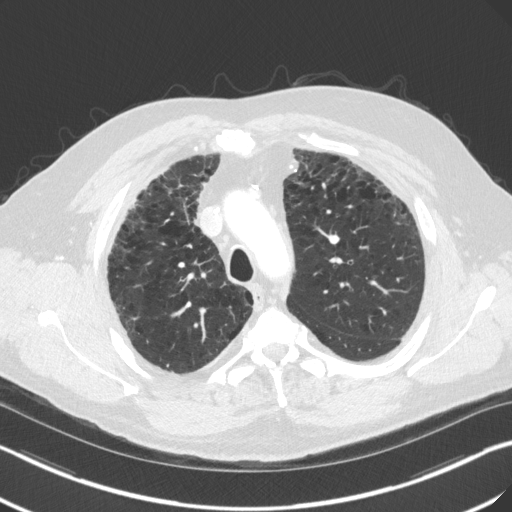
[im 128/165  lung]
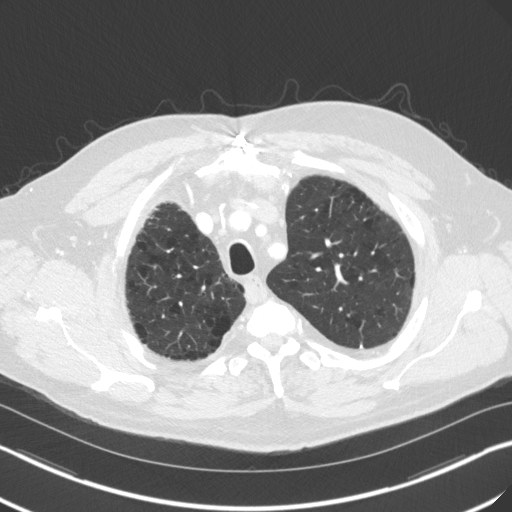
[im 140/165  lung]
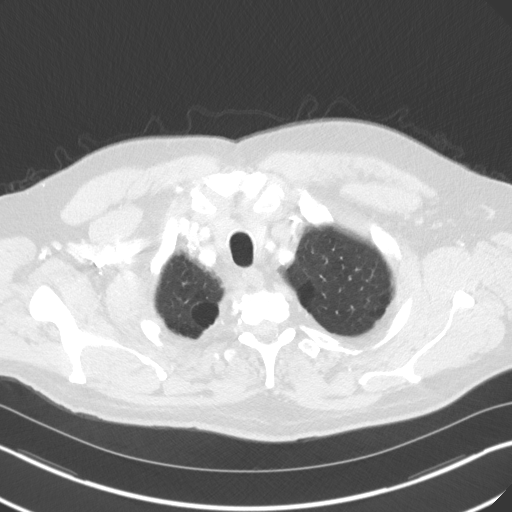
[im 152/165  lung]
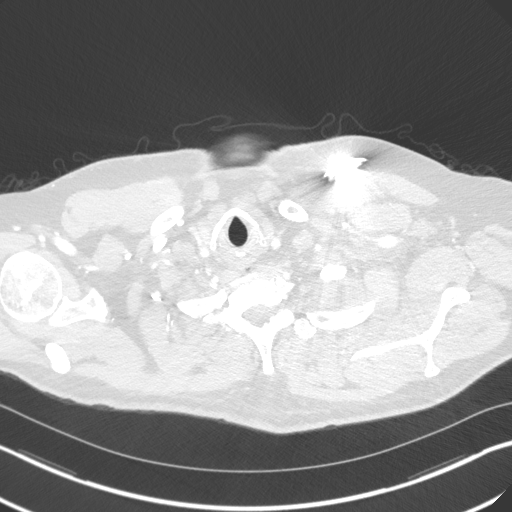

[Series 5: coronal · coronal · 0.67mm/px · 3 of 151 slices shown]
[im 31/151  lung]
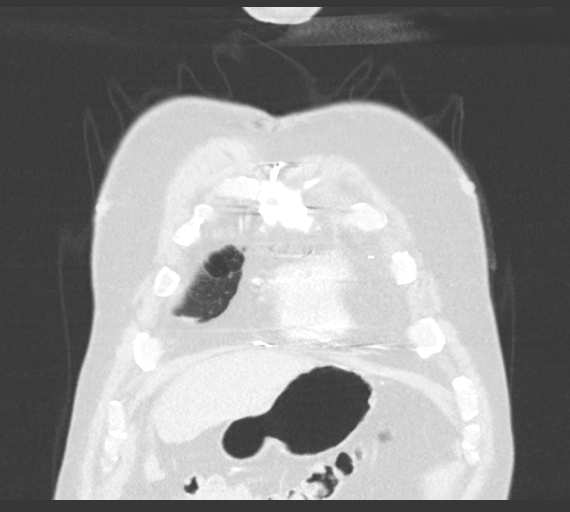
[im 61/151  lung]
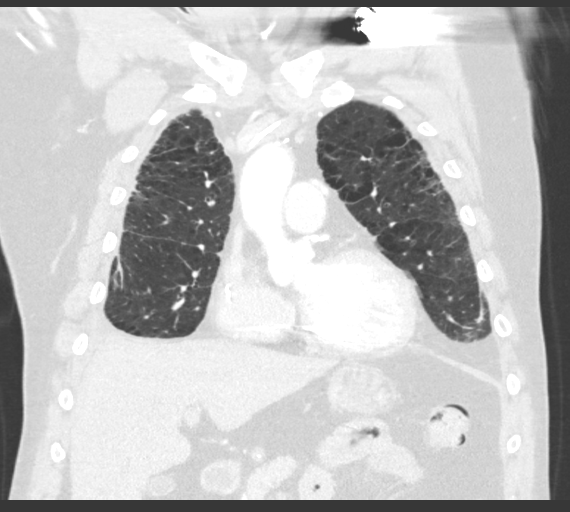
[im 91/151  lung]
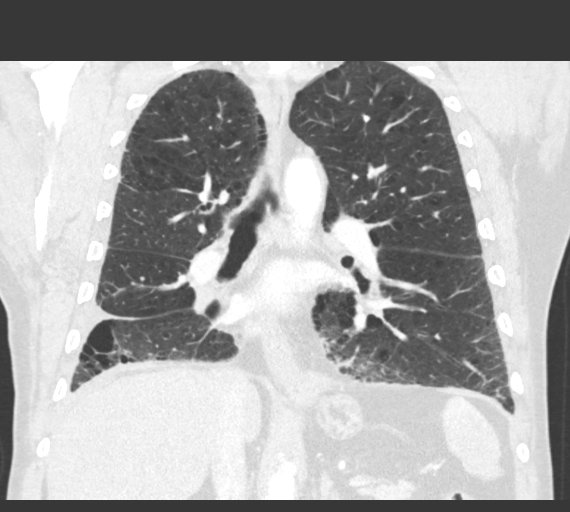

[15 of 36 positions shown; findings below may reference images not displayed]

FINDINGS: Cardiovascular: Atherosclerotic calcification of the arterial
vasculature. Heart is enlarged. No pericardial effusion.

Mediastinum/Nodes: Mediastinal lymph nodes measure up to 10 mm in
the low right paratracheal station, as before. Hilar lymph nodes
measure up to 10 mm on the right, stable. Subcarinal lymph node
measures 9 mm, stable. No axillary adenopathy. Esophagus is grossly
unremarkable.

Lungs/Pleura: Moderate centrilobular and paraseptal emphysema. Post
treatment collapse/consolidation, bronchiectasis architectural
distortion in the posterior right lower lobe. Previously measured
lesion in the right lower lobe is no longer visualized. There is
associated pleural thickening and a small amount of loculated
pleural fluid. Calcified granulomas. 5 mm left lower lobe nodule
(image 109), stable. No left pleural fluid. Airway is unremarkable.

Upper Abdomen: Visualized portions of the liver, gallbladder,
adrenal glands, kidneys, spleen, pancreas, stomach and bowel are
grossly unremarkable. No upper abdominal adenopathy.

Musculoskeletal: Degenerative changes in the spine. No worrisome
lytic or sclerotic lesions.
IMPRESSION: 1. No evidence of recurrent or metastatic disease. Evolutionary post
treatment changes in the right lower lobe. Previously measured right
lower lobe lesion is no longer visualized.
2.  Aortic atherosclerosis (HMB02-170.0).
3.  Emphysema (HMB02-8GH.I).

## 2017-12-19 ENCOUNTER — Encounter (HOSPITAL_COMMUNITY): Payer: Self-pay

## 2017-12-19 ENCOUNTER — Encounter (HOSPITAL_COMMUNITY)
Admission: RE | Admit: 2017-12-19 | Discharge: 2017-12-19 | Disposition: A | Discharge status: Home / Self Care | Payer: Medicare HMO | Source: Ambulatory Visit | Attending: Internal Medicine | Admitting: Internal Medicine

## 2017-12-19 NOTE — Patient Instructions (Signed)
Charles Savage  12/19/2017     @PREFPERIOPPHARMACY @   Your procedure is scheduled on  12/24/2017   Report to [facility] at  1045   A.M.  Call this number if you have problems the morning of surgery:  [phone]   Remember:  Do not eat or drink after midnight.  You may drink clear liquids until  (follow the instructions given to you) .  Clear liquids allowed are:                    Water, Juice (non-citric and without pulp), Carbonated beverages, Clear Tea, Black Coffee only, Plain Jell-O only, Gatorade and Plain Popsicles only    Take these medicines the morning of surgery with A SIP OF WATER  Coreg, celebrex, lexapro, hydrocodone, protonix.    Do not wear jewelry, make-up or nail polish.  Do not wear lotions, powders, or perfumes, or deodorant.  Do not shave 48 hours prior to surgery.  Men may shave face and neck.  Do not bring valuables to the hospital.  [facility] is not responsible for any belongings or valuables.  Contacts, dentures or bridgework may not be worn into surgery.  Leave your suitcase in the car.  After surgery it may be brought to your room.  For patients admitted to the hospital, discharge time will be determined by your treatment team.  Patients discharged the day of surgery will not be allowed to drive home.   Name and phone number of your driver:   family Special instructions:  Follow the diet instructions given to you from Dr Roseanne Kaufman office.  Please read over the following fact sheets that you were given. Anesthesia Post-op Instructions and Care and Recovery After Surgery       Esophagogastroduodenoscopy Esophagogastroduodenoscopy (EGD) is a procedure to examine the lining of the esophagus, stomach, and first part of the small intestine (duodenum). This procedure is done to check for problems such as inflammation, bleeding, ulcers, or growths. During this procedure, a long, flexible, lighted tube with a camera attached (endoscope) is  inserted down the throat. Tell a health care provider about:  Any allergies you have.  All medicines you are taking, including vitamins, herbs, eye drops, creams, and over-the-counter medicines.  Any problems you or family members have had with anesthetic medicines.  Any blood disorders you have.  Any surgeries you have had.  Any medical conditions you have.  Whether you are pregnant or may be pregnant. What are the risks? Generally, this is a safe procedure. However, problems may occur, including:  Infection.  Bleeding.  A tear (perforation) in the esophagus, stomach, or duodenum.  Trouble breathing.  Excessive sweating.  Spasms of the larynx.  A slowed heartbeat.  Low blood pressure.  What happens before the procedure?  Follow instructions from your health care provider about eating or drinking restrictions.  Ask your health care provider about: ? Changing or stopping your regular medicines. This is especially important if you are taking diabetes medicines or blood thinners. ? Taking medicines such as aspirin and ibuprofen. These medicines can thin your blood. Do not take these medicines before your procedure if your health care provider instructs you not to.  Plan to have someone take you home after the procedure.  If you wear dentures, be ready to remove them before the procedure. What happens during the procedure?  To reduce your risk of infection, your health care team  will wash or sanitize their hands.  An IV tube will be put in a vein in your hand or arm. You will get medicines and fluids through this tube.  You will be given one or more of the following: ? A medicine to help you relax (sedative). ? A medicine to numb the area (local anesthetic). This medicine may be sprayed into your throat. It will make you feel more comfortable and keep you from gagging or coughing during the procedure. ? A medicine for pain.  A mouth guard may be placed in your  mouth to protect your teeth and to keep you from biting on the endoscope.  You will be asked to lie on your left side.  The endoscope will be lowered down your throat into your esophagus, stomach, and duodenum.  Air will be put into the endoscope. This will help your health care provider see better.  The lining of your esophagus, stomach, and duodenum will be examined.  Your health care provider may: ? Take a tissue sample so it can be looked at in a lab (biopsy). ? Remove growths. ? Remove objects (foreign bodies) that are stuck. ? Treat any bleeding with medicines or other devices that stop tissue from bleeding. ? Widen (dilate) or stretch narrowed areas of your esophagus and stomach.  The endoscope will be taken out. The procedure may vary among health care providers and hospitals. What happens after the procedure?  Your blood pressure, heart rate, breathing rate, and blood oxygen level will be monitored often until the medicines you were given have worn off.  Do not eat or drink anything until the numbing medicine has worn off and your gag reflex has returned. This information is not intended to replace advice given to you by your health care provider. Make sure you discuss any questions you have with your health care provider. Document Released: 10/13/2004 Document Revised: 11/18/2015 Document Reviewed: 05/06/2015 Elsevier Interactive Patient Education  2018 [employer]. Esophagogastroduodenoscopy, Care After Refer to this sheet in the next few weeks. These instructions provide you with information about caring for yourself after your procedure. Your health care provider may also give you more specific instructions. Your treatment has been planned according to current medical practices, but problems sometimes occur. Call your health care provider if you have any problems or questions after your procedure. What can I expect after the procedure? After the procedure, it is common to  have:  A sore throat.  Nausea.  Bloating.  Dizziness.  Fatigue.  Follow these instructions at home:  Do not eat or drink anything until the numbing medicine (local anesthetic) has worn off and your gag reflex has returned. You will know that the local anesthetic has worn off when you can swallow comfortably.  Do not drive for 24 hours if you received a medicine to help you relax (sedative).  If your health care provider took a tissue sample for testing during the procedure, make sure to get your test results. This is your responsibility. Ask your health care provider or the department performing the test when your results will be ready.  Keep all follow-up visits as told by your health care provider. This is important. Contact a health care provider if:  You cannot stop coughing.  You are not urinating.  You are urinating less than usual. Get help right away if:  You have trouble swallowing.  You cannot eat or drink.  You have throat or chest pain that gets worse.  You  are dizzy or light-headed.  You faint.  You have nausea or vomiting.  You have chills.  You have a fever.  You have severe abdominal pain.  You have black, tarry, or bloody stools. This information is not intended to replace advice given to you by your health care provider. Make sure you discuss any questions you have with your health care provider. Document Released: 05/29/2012 Document Revised: 11/18/2015 Document Reviewed: 05/06/2015 Elsevier Interactive Patient Education  2018 [employer].  Esophageal Dilatation Esophageal dilatation is a procedure to open a blocked or narrowed part of the esophagus. The esophagus is the long tube in your throat that carries food and liquid from your mouth to your stomach. The procedure is also called esophageal dilation. You may need this procedure if you have a buildup of scar tissue in your esophagus that makes it difficult, painful, or even impossible to  swallow. This can be caused by gastroesophageal reflux disease (GERD). In rare cases, people need this procedure because they have cancer of the esophagus or a problem with the way food moves through the esophagus. Sometimes you may need to have another dilatation to enlarge the opening of the esophagus gradually. Tell a health care provider about:  Any allergies you have.  All medicines you are taking, including vitamins, herbs, eye drops, creams, and over-the-counter medicines.  Any problems you or family members have had with anesthetic medicines.  Any blood disorders you have.  Any surgeries you have had.  Any medical conditions you have.  Any antibiotic medicines you are required to take before dental procedures. What are the risks? Generally, this is a safe procedure. However, problems can occur and include:  Bleeding from a tear in the lining of the esophagus.  A hole (perforation) in the esophagus.  What happens before the procedure?  Do not eat or drink anything after midnight on the night before the procedure or as directed by your health care provider.  Ask your health care provider about changing or stopping your regular medicines. This is especially important if you are taking diabetes medicines or blood thinners.  Plan to have someone take you home after the procedure. What happens during the procedure?  You will be given a medicine that makes you relaxed and sleepy (sedative).  A medicine may be sprayed or gargled to numb the back of the throat.  Your health care provider can use various instruments to do an esophageal dilatation. During the procedure, the instrument used will be placed in your mouth and passed down into your esophagus. Options include: ? Simple dilators. This instrument is carefully placed in the esophagus to stretch it. ? Guided wire bougies. In this method, a flexible tube (endoscope) is used to insert a wire into the esophagus. The dilator is  passed over this wire to enlarge the esophagus. Then the wire is removed. ? Balloon dilators. An endoscope with a small balloon at the end is passed down into the esophagus. Inflating the balloon gently stretches the esophagus and opens it up. What happens after the procedure?  Your blood pressure, heart rate, breathing rate, and blood oxygen level will be monitored often until the medicines you were given have worn off.  Your throat may feel slightly sore and will probably still feel numb. This will improve slowly over time.  You will not be allowed to eat or drink until the throat numbness has resolved.  If this is a same-day procedure, you may be allowed to go home once  you have been able to drink, urinate, and sit on the edge of the bed without nausea or dizziness.  If this is a same-day procedure, you should have a friend or family member with you for the next 24 hours after the procedure. This information is not intended to replace advice given to you by your health care provider. Make sure you discuss any questions you have with your health care provider. Document Released: 08/03/2005 Document Revised: 11/18/2015 Document Reviewed: 10/22/2013 Elsevier Interactive Patient Education  2018 [place] Anesthesia is a term that refers to techniques, procedures, and medicines that help a person stay safe and comfortable during a medical procedure. Monitored anesthesia care, or sedation, is one type of anesthesia. Your anesthesia specialist may recommend sedation if you will be having a procedure that does not require you to be unconscious, such as:  Cataract surgery.  A dental procedure.  A biopsy.  A colonoscopy.  During the procedure, you may receive a medicine to help you relax (sedative). There are three levels of sedation:  Mild sedation. At this level, you may feel awake and relaxed. You will be able to follow directions.  Moderate sedation. At  this level, you will be sleepy. You may not remember the procedure.  Deep sedation. At this level, you will be asleep. You will not remember the procedure.  The more medicine you are given, the deeper your level of sedation will be. Depending on how you respond to the procedure, the anesthesia specialist may change your level of sedation or the type of anesthesia to fit your needs. An anesthesia specialist will monitor you closely during the procedure. Let your health care provider know about:  Any allergies you have.  All medicines you are taking, including vitamins, herbs, eye drops, creams, and over-the-counter medicines.  Any use of steroids (by mouth or as a cream).  Any problems you or family members have had with sedatives and anesthetic medicines.  Any blood disorders you have.  Any surgeries you have had.  Any medical conditions you have, such as sleep apnea.  Whether you are pregnant or may be pregnant.  Any use of cigarettes, alcohol, or street drugs. What are the risks? Generally, this is a safe procedure. However, problems may occur, including:  Getting too much medicine (oversedation).  Nausea.  Allergic reaction to medicines.  Trouble breathing. If this happens, a breathing tube may be used to help with breathing. It will be removed when you are awake and breathing on your own.  Heart trouble.  Lung trouble.  Before the procedure Staying hydrated Follow instructions from your health care provider about hydration, which may include:  Up to 2 hours before the procedure - you may continue to drink clear liquids, such as water, clear fruit juice, black coffee, and plain tea.  Eating and drinking restrictions Follow instructions from your health care provider about eating and drinking, which may include:  8 hours before the procedure - stop eating heavy meals or foods such as meat, fried foods, or fatty foods.  6 hours before the procedure - stop eating  light meals or foods, such as toast or cereal.  6 hours before the procedure - stop drinking milk or drinks that contain milk.  2 hours before the procedure - stop drinking clear liquids.  Medicines Ask your health care provider about:  Changing or stopping your regular medicines. This is especially important if you are taking diabetes medicines or blood thinners.  Taking medicines such as aspirin and ibuprofen. These medicines can thin your blood. Do not take these medicines before your procedure if your health care provider instructs you not to.  Tests and exams  You will have a physical exam.  You may have blood tests done to show: ? How well your kidneys and liver are working. ? How well your blood can clot.  General instructions  Plan to have someone take you home from the hospital or clinic.  If you will be going home right after the procedure, plan to have someone with you for 24 hours.  What happens during the procedure?  Your blood pressure, heart rate, breathing, level of pain and overall condition will be monitored.  An IV tube will be inserted into one of your veins.  Your anesthesia specialist will give you medicines as needed to keep you comfortable during the procedure. This may mean changing the level of sedation.  The procedure will be performed. After the procedure  Your blood pressure, heart rate, breathing rate, and blood oxygen level will be monitored until the medicines you were given have worn off.  Do not drive for 24 hours if you received a sedative.  You may: ? Feel sleepy, clumsy, or nauseous. ? Feel forgetful about what happened after the procedure. ? Have a sore throat if you had a breathing tube during the procedure. ? Vomit. This information is not intended to replace advice given to you by your health care provider. Make sure you discuss any questions you have with your health care provider. Document Released: 03/08/2005 Document  Revised: 11/19/2015 Document Reviewed: 10/03/2015 Elsevier Interactive Patient Education  2018 [place], Care After These instructions provide you with information about caring for yourself after your procedure. Your health care provider may also give you more specific instructions. Your treatment has been planned according to current medical practices, but problems sometimes occur. Call your health care provider if you have any problems or questions after your procedure. What can I expect after the procedure? After your procedure, it is common to:  Feel sleepy for several hours.  Feel clumsy and have poor balance for several hours.  Feel forgetful about what happened after the procedure.  Have poor judgment for several hours.  Feel nauseous or vomit.  Have a sore throat if you had a breathing tube during the procedure.  Follow these instructions at home: For at least 24 hours after the procedure:   Do not: ? Participate in activities in which you could fall or become injured. ? Drive. ? Use heavy machinery. ? Drink alcohol. ? Take sleeping pills or medicines that cause drowsiness. ? Make important decisions or sign legal documents. ? Take care of children on your own.  Rest. Eating and drinking  Follow the diet that is recommended by your health care provider.  If you vomit, drink water, juice, or soup when you can drink without vomiting.  Make sure you have little or no nausea before eating solid foods. General instructions  Have a responsible adult stay with you until you are awake and alert.  Take over-the-counter and prescription medicines only as told by your health care provider.  If you smoke, do not smoke without supervision.  Keep all follow-up visits as told by your health care provider. This is important. Contact a health care provider if:  You keep feeling nauseous or you keep vomiting.  You feel light-headed.  You  develop a rash.  You  have a fever. Get help right away if:  You have trouble breathing. This information is not intended to replace advice given to you by your health care provider. Make sure you discuss any questions you have with your health care provider. Document Released: 10/03/2015 Document Revised: 02/02/2016 Document Reviewed: 10/03/2015 Elsevier Interactive Patient Education  [employer].

## 2017-12-24 ENCOUNTER — Encounter (HOSPITAL_COMMUNITY)
Admission: RE | Disposition: A | Discharge status: Home / Self Care | Payer: Self-pay | Source: Ambulatory Visit | Attending: Internal Medicine

## 2017-12-24 ENCOUNTER — Encounter (HOSPITAL_COMMUNITY): Payer: Self-pay | Admitting: Anesthesiology

## 2017-12-24 ENCOUNTER — Ambulatory Visit (HOSPITAL_COMMUNITY)
Admission: RE | Admit: 2017-12-24 | Discharge: 2017-12-24 | Disposition: A | Discharge status: Home / Self Care | Payer: Medicare HMO | Source: Ambulatory Visit | Attending: Internal Medicine | Admitting: Internal Medicine

## 2017-12-24 ENCOUNTER — Ambulatory Visit (HOSPITAL_COMMUNITY): Payer: Medicare HMO | Admitting: Anesthesiology

## 2017-12-24 ENCOUNTER — Other Ambulatory Visit: Payer: Self-pay

## 2017-12-24 DIAGNOSIS — M5137 Other intervertebral disc degeneration, lumbosacral region: Secondary | ICD-10-CM | POA: Insufficient documentation

## 2017-12-24 DIAGNOSIS — M170 Bilateral primary osteoarthritis of knee: Secondary | ICD-10-CM | POA: Diagnosis not present

## 2017-12-24 DIAGNOSIS — Z981 Arthrodesis status: Secondary | ICD-10-CM | POA: Insufficient documentation

## 2017-12-24 DIAGNOSIS — Z8673 Personal history of transient ischemic attack (TIA), and cerebral infarction without residual deficits: Secondary | ICD-10-CM | POA: Insufficient documentation

## 2017-12-24 DIAGNOSIS — M545 Low back pain: Secondary | ICD-10-CM | POA: Insufficient documentation

## 2017-12-24 DIAGNOSIS — R1310 Dysphagia, unspecified: Secondary | ICD-10-CM | POA: Diagnosis not present

## 2017-12-24 DIAGNOSIS — I252 Old myocardial infarction: Secondary | ICD-10-CM | POA: Insufficient documentation

## 2017-12-24 DIAGNOSIS — Z85118 Personal history of other malignant neoplasm of bronchus and lung: Secondary | ICD-10-CM | POA: Diagnosis not present

## 2017-12-24 DIAGNOSIS — I110 Hypertensive heart disease with heart failure: Secondary | ICD-10-CM | POA: Insufficient documentation

## 2017-12-24 DIAGNOSIS — K222 Esophageal obstruction: Secondary | ICD-10-CM | POA: Diagnosis not present

## 2017-12-24 DIAGNOSIS — Z923 Personal history of irradiation: Secondary | ICD-10-CM | POA: Insufficient documentation

## 2017-12-24 DIAGNOSIS — I10 Essential (primary) hypertension: Secondary | ICD-10-CM | POA: Diagnosis not present

## 2017-12-24 DIAGNOSIS — R1314 Dysphagia, pharyngoesophageal phase: Secondary | ICD-10-CM | POA: Insufficient documentation

## 2017-12-24 DIAGNOSIS — J449 Chronic obstructive pulmonary disease, unspecified: Secondary | ICD-10-CM | POA: Insufficient documentation

## 2017-12-24 DIAGNOSIS — Z91030 Bee allergy status: Secondary | ICD-10-CM | POA: Insufficient documentation

## 2017-12-24 DIAGNOSIS — F329 Major depressive disorder, single episode, unspecified: Secondary | ICD-10-CM | POA: Diagnosis not present

## 2017-12-24 DIAGNOSIS — M479 Spondylosis, unspecified: Secondary | ICD-10-CM | POA: Diagnosis not present

## 2017-12-24 DIAGNOSIS — G4730 Sleep apnea, unspecified: Secondary | ICD-10-CM | POA: Diagnosis not present

## 2017-12-24 DIAGNOSIS — E785 Hyperlipidemia, unspecified: Secondary | ICD-10-CM | POA: Insufficient documentation

## 2017-12-24 DIAGNOSIS — Z95810 Presence of automatic (implantable) cardiac defibrillator: Secondary | ICD-10-CM | POA: Diagnosis not present

## 2017-12-24 DIAGNOSIS — Z7982 Long term (current) use of aspirin: Secondary | ICD-10-CM | POA: Diagnosis not present

## 2017-12-24 DIAGNOSIS — Z79899 Other long term (current) drug therapy: Secondary | ICD-10-CM | POA: Insufficient documentation

## 2017-12-24 DIAGNOSIS — F17210 Nicotine dependence, cigarettes, uncomplicated: Secondary | ICD-10-CM | POA: Insufficient documentation

## 2017-12-24 DIAGNOSIS — I429 Cardiomyopathy, unspecified: Secondary | ICD-10-CM | POA: Insufficient documentation

## 2017-12-24 DIAGNOSIS — K449 Diaphragmatic hernia without obstruction or gangrene: Secondary | ICD-10-CM | POA: Insufficient documentation

## 2017-12-24 DIAGNOSIS — Z885 Allergy status to narcotic agent status: Secondary | ICD-10-CM | POA: Insufficient documentation

## 2017-12-24 DIAGNOSIS — I509 Heart failure, unspecified: Secondary | ICD-10-CM | POA: Diagnosis not present

## 2017-12-24 DIAGNOSIS — G8929 Other chronic pain: Secondary | ICD-10-CM | POA: Diagnosis not present

## 2017-12-24 DIAGNOSIS — Z888 Allergy status to other drugs, medicaments and biological substances status: Secondary | ICD-10-CM | POA: Insufficient documentation

## 2017-12-24 DIAGNOSIS — Z951 Presence of aortocoronary bypass graft: Secondary | ICD-10-CM | POA: Insufficient documentation

## 2017-12-24 DIAGNOSIS — I2510 Atherosclerotic heart disease of native coronary artery without angina pectoris: Secondary | ICD-10-CM | POA: Insufficient documentation

## 2017-12-24 DIAGNOSIS — Z9981 Dependence on supplemental oxygen: Secondary | ICD-10-CM | POA: Diagnosis not present

## 2017-12-24 DIAGNOSIS — Z8249 Family history of ischemic heart disease and other diseases of the circulatory system: Secondary | ICD-10-CM | POA: Insufficient documentation

## 2017-12-24 HISTORY — PX: MALONEY DILATION: SHX5535

## 2017-12-24 HISTORY — PX: ESOPHAGOGASTRODUODENOSCOPY (EGD) WITH PROPOFOL: SHX5813

## 2017-12-24 SURGERY — ESOPHAGOGASTRODUODENOSCOPY (EGD) WITH PROPOFOL
Anesthesia: Monitor Anesthesia Care

## 2017-12-24 MED ORDER — PROMETHAZINE HCL 25 MG/ML IJ SOLN: 6.2500 mg | INTRAMUSCULAR | Status: DC | PRN

## 2017-12-24 MED ORDER — MEPERIDINE HCL 100 MG/ML IJ SOLN: 6.2500 mg | INTRAMUSCULAR | Status: DC | PRN

## 2017-12-24 MED ORDER — LACTATED RINGERS IV SOLN: INTRAVENOUS | Status: DC

## 2017-12-24 MED ORDER — LIDOCAINE VISCOUS HCL 2 % MT SOLN
OROMUCOSAL | Status: AC
  Filled 2017-12-24: qty 15

## 2017-12-24 MED ORDER — PROPOFOL 500 MG/50ML IV EMUL
INTRAVENOUS | Status: DC | PRN
  Administered 2017-12-24: 150 ug/kg/min via INTRAVENOUS

## 2017-12-24 MED ORDER — LACTATED RINGERS IV SOLN
INTRAVENOUS | Status: DC
  Administered 2017-12-24: 1000 mL via INTRAVENOUS

## 2017-12-24 MED ORDER — LACTATED RINGERS IV SOLN
INTRAVENOUS | Status: DC | PRN
  Administered 2017-12-24: 14:00:00 via INTRAVENOUS

## 2017-12-24 NOTE — H&P (Signed)
@LOGO @   Primary Care Physician:  Sharilyn Sites, MD Primary Gastroenterologist:  Dr. Gala Romney  Pre-Procedure History & Physical: HPI:  Charles Savage is a 62 y.o. male here for further evaluation of esophageal dysphagia via EGD.  Past Medical History:  Diagnosis Date  . 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 Arthritis    "back; knees" (06/14/2012)  . Cerebrovascular disease    h/o CVA in 07/2011 + left carotid endarterectomy  . CHF (congestive heart failure) ([place])   . Cholelithiasis 07/05/2012   Asymptomatic; identified on CT scanning of the chest;  . Chronic lower back pain   . COPD (chronic obstructive pulmonary disease) ([place])    on 2L O2 at home since 08/2011  . Coronary artery disease   . DDD (degenerative disc disease), lumbosacral   . Depression 09/2011   "after OHS" (06/14/2012)  . Hyperlipemia   . ICD (implantable cardiac defibrillator) in place   . Lung cancer ([place]) 2018   Radiation treatment  . Myocardial infarction ([place]) 08/2011  . Other primary cardiomyopathies   . Pneumonia   . Sleep apnea   . Stroke [facility]) 07/2011   denies residual (06/14/2012)    Past Surgical History:  Procedure Laterality Date  . BACK SURGERY    . CARDIAC CATHETERIZATION  09/2011  . CARDIAC DEFIBRILLATOR PLACEMENT  06/14/2012  . CAROTID ENDARTERECTOMY  07/2011   "left" (06/14/2012)  . COLONOSCOPY N/A 10/16/2014   Dr. Gala Romney: rectal polyps removed (hyperplastic). next TCS 10 years.   . CORONARY ARTERY BYPASS GRAFT  10/20/2011   Procedure: CORONARY ARTERY BYPASS GRAFTING (CABG);  Surgeon: [name], MD;  Location: [place];  Service: Open Heart Surgery;  Laterality: N/A;  CABG x six;  using left internal mammary artery and right leg greater saphenous vein harvested endoscopically  . ESOPHAGOGASTRODUODENOSCOPY (EGD) WITH PROPOFOL N/A 11/12/2017   Procedure: ESOPHAGOGASTRODUODENOSCOPY (EGD) WITH PROPOFOL;  Surgeon: Daneil Dolin, MD;   [facility];  Service: Endoscopy;  Laterality: N/A;  7:30am  . FOREIGN BODY REMOVAL  06/05/2011   Procedure: FOREIGN BODY REMOVAL ADULT;  Surgeon: Hermelinda Dellen;  Location: [place];  Service: Plastics;  Laterality: Right;  removal foreign body from right side face   . IMPLANTABLE CARDIOVERTER DEFIBRILLATOR IMPLANT N/A 06/14/2012   Procedure: IMPLANTABLE CARDIOVERTER DEFIBRILLATOR IMPLANT;  Surgeon: Evans Lance, MD;  Location[facility] C[facility] LAB;  Service: Cardiovascular;  Laterality: N/A;  . KNEE ARTHROSCOPY  2002   "right" (06/14/2012)  . LEFT AND RIGHT HEART CATHETERIZATION WITH CORONARY ANGIOGRAM N/A 10/16/2011   Procedure: LEFT AND RIGHT HEART CATHETERIZATION WITH CORONARY ANGIOGRAM;  Surgeon: Jolaine Artist, MD;  Location: [facility] CATH LAB;  Service: Cardiovascular;  Laterality: N/A;  . LUMBAR [place] SURGERY  2012  . MITRAL VALVE REPAIR  10/20/2011   Procedure: MITRAL VALVE REPAIR (MVR);  Surgeon: [name], MD;  Location: [place];  Service: Open Heart Surgery;  Laterality: N/A;  . POSTERIOR LUMBAR FUSION  1996  . TEE WITHOUT CARDIOVERSION  10/17/2011   Procedure: TRANSESOPHAGEAL ECHOCARDIOGRAM (TEE);  Surgeon: Jolaine Artist, MD;  Location: [facility] ENDOSCOPY;  Service: Cardiovascular;  Laterality: N/A;    Prior to Admission medications   Medication Sig Start Date End Date Taking? Authorizing Provider  aspirin 81 MG tablet Take 81 mg by mouth daily.   Yes [provider]  atorvastatin (LIPITOR) 80 MG tablet TAKE ONE (1) TABLET EACH DAY Patient taking differently:  Take 80 mg by mouth every evening.  07/10/17  Yes BranchAlphonse Guild, MD  carvedilol (COREG) 12.5 MG tablet Take 1-2 tablets (12.5-25 mg total) by mouth See admin instructions. Take 12.5 mg am and 25 mg ( 2 tablets) in the pm 10/31/17  Yes Branch, Alphonse Guild, MD  celecoxib (CELEBREX) 200 MG capsule Take 200 mg by mouth daily.  01/04/17  Yes [provider]  EPIPEN 2-PAK 0.3 MG/0.3ML SOAJ  injection Inject 0.3 mg as directed daily as needed (for anaphylactic reactions.).  05/19/13  Yes [provider]  escitalopram (LEXAPRO) 20 MG tablet Take 20 mg by mouth daily.   Yes [provider]  furosemide (LASIX) 20 MG tablet Take 10 mg by mouth daily as needed. For swelling/fluid retention. 10/15/17  Yes [provider]  HYDROcodone-acetaminophen (NORCO) 10-325 MG per tablet Take 1 tablet by mouth every 8 (eight) hours as needed. Pain Patient taking differently: Take 1 tablet by mouth every 4 (four) hours as needed for moderate pain.  11/03/11  Yes Lendon Colonel, NP  levocetirizine (XYZAL) 5 MG tablet Take 5 mg by mouth at bedtime as needed for allergies.   Yes [provider]  pantoprazole (PROTONIX) 40 MG tablet Take 40 mg by mouth daily as needed (for acid reflux).    Yes [provider]  spironolactone (ALDACTONE) 25 MG tablet Take 12.5 mg by mouth daily as needed. For swelling/fluid retention. 10/15/17  Yes [provider]    Allergies as of 11/12/2017 - Review Complete 11/12/2017  Allergen Reaction Noted  . Metoprolol Shortness Of Breath and Nausea And Vomiting 10/09/2011  . Peach flavor Hives, Itching, and Other (See Comments) 10/09/2011  . Morphine and related Hives and Itching 09/02/2010  . Bee venom Hives and Swelling 11/10/2016    Family History  Problem Relation Age of Onset  . Coronary artery disease Mother   . Coronary artery disease Father   . Arrhythmia Brother   . Colon cancer Neg Hx     Social History   Socioeconomic History  . Marital status: Married    Spouse name: Not on file  . Number of children: Not on file  . Years of education: Not on file  . Highest education level: Not on file  Occupational History  . Not on file  Social Needs  . Financial resource strain: Not on file  . Food insecurity:    Worry: Not on file    Inability: Not on file  . Transportation needs:    Medical: Not on file     Non-medical: Not on file  Tobacco Use  . Smoking status: Current Every Day Smoker    Packs/day: 1.00    Years: 46.00    Pack years: 46.00    Types: Cigarettes    Start date: 12/12/1969  . Smokeless tobacco: Never Used  . Tobacco comment: currently smoking 2 cigarettes per day  Substance and Sexual Activity  . 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 Drug use: No  . Sexual activity: Never    Comment: Quit drinking alcohol 2 yrs ago  Lifestyle  . Physical activity:    Days per week: Not on file    Minutes per session: Not on file  . Stress: Not on file  Relationships  . Social connections:    Talks on phone: Not on file    Gets  together: Not on file    Attends religious service: Not on file    Active member of club or organization: Not on file    Attends meetings of clubs or organizations: Not on file    Relationship status: Not on file  . Intimate partner violence:    Fear of current or ex partner: Not on file    Emotionally abused: Not on file    Physically abused: Not on file    Forced sexual activity: Not on file  Other Topics Concern  . 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  Lives with wife.    Review of Systems: See HPI, otherwise negative ROS  Physical Exam: BP 129/73   Pulse 60   Temp 97.6 F (36.4 C) (Oral)   Resp (!) 25  General:   Alert,  Well-developed, well-nourished, pleasant and cooperative in NAD Neck:  Supple; no masses or thyromegaly. No significant cervical adenopathy. Lungs:  Clear throughout to auscultation.   No wheezes, crackles, or rhonchi. No acute distress. Heart:  Regular rate and rhythm; no murmurs, clicks, rubs,  or gallops. Abdomen: Non-distended, normal bowel sounds.  Soft and nontender without appreciable mass or hepatosplenomegaly.  Pulses:  Normal pulses noted. Extremities:  Without clubbing or  edema.  Impression/Plan:  62 year old gentleman chronic esophageal dysphagia. He is not anticoagulated. EGD with ED to further evaluate/treat today.  The risks, benefits, limitations, alternatives and imponderables have been reviewed with the patient. Potential for esophageal dilation, biopsy, etc. have also been reviewed.  Questions have been answered. All parties agreeable.     Notice: This dictation was prepared with Dragon dictation along with smaller phrase technology. Any transcriptional errors that result from this process are unintentional and may not be corrected upon review.

## 2017-12-24 NOTE — Transfer of Care (Signed)
Immediate Anesthesia Transfer of Care Note  Patient: Charles Savage  Procedure(s) Performed: ESOPHAGOGASTRODUODENOSCOPY (EGD) WITH PROPOFOL (N/A ) MALONEY DILATION (N/A )  Patient Location: PACU  Anesthesia Type:MAC  Level of Consciousness: awake, alert , oriented and patient cooperative  Airway & Oxygen Therapy: Patient Spontanous Breathing  Post-op Assessment: Report given to RN and Post -op Vital signs reviewed and stable  Post vital signs: Reviewed and stable  Last Vitals:  Vitals Value Taken Time  BP    Temp    Pulse 71 12/24/2017  2:32 PM  Resp    SpO2 91 % 12/24/2017  2:32 PM  Vitals shown include unvalidated device data.  Last Pain:  Vitals:   12/24/17 1403  TempSrc:   PainSc: 10-Worst pain ever      Patients Stated Pain Goal: 8 (84/13/24 4010)  Complications: No apparent anesthesia complications

## 2017-12-24 NOTE — Anesthesia Procedure Notes (Signed)
Procedure Name: MAC Date/Time: 12/24/2017 2:00 PM Performed by: Andree Elk Amy A, CRNA Pre-anesthesia Checklist: Patient identified, Emergency Drugs available, Suction available, Patient being monitored and Timeout performed Oxygen Delivery Method: Simple face mask

## 2017-12-24 NOTE — Anesthesia Preprocedure Evaluation (Signed)
Anesthesia Evaluation  Patient identified by MRN, date of birth, ID band Patient awake    Reviewed: Allergy & Precautions, H&P , NPO status , [name] Chart, lab work & pertinent test results, reviewed documented beta blocker date and time   Airway Mallampati: III  TM Distance: >3 FB Neck ROM: full    Dental no notable dental hx. (+) Teeth Intact, Dental Advidsory Given   Pulmonary neg pulmonary ROS, sleep apnea , pneumonia, resolved, COPD, Current Smoker,    Pulmonary exam normal breath sounds clear to auscultation       Cardiovascular Exercise Tolerance: Good hypertension, On Medications + CAD, + Past MI and +CHF  negative cardio ROS   Rhythm:regular Rate:Normal     Neuro/Psych PSYCHIATRIC DISORDERS Depression CVA negative neurological ROS  negative psych ROS   GI/Hepatic negative GI ROS, Neg liver ROS, GERD  ,  Endo/Other  negative endocrine ROS  Renal/GU negative Renal ROS  negative genitourinary   Musculoskeletal   Abdominal   Peds  Hematology negative hematology ROS (+)   Anesthesia Other Findings S/P EGD approx one month ago without anesthesia complications  Reproductive/Obstetrics negative OB ROS                             Anesthesia Physical Anesthesia Plan  ASA: IV  Anesthesia Plan: MAC   Post-op Pain Management:    Induction:   PONV Risk Score and Plan:   Airway Management Planned:   Additional Equipment:   Intra-op Plan:   Post-operative Plan:   Informed Consent: I have reviewed the patients History and Physical, chart, labs and discussed the procedure including the risks, benefits and alternatives for the proposed anesthesia with the patient or authorized representative who has indicated his/her understanding and acceptance.   Dental Advisory Given  Plan Discussed with: CRNA and Anesthesiologist  Anesthesia Plan Comments:         Anesthesia Quick  Evaluation

## 2017-12-24 NOTE — Addendum Note (Signed)
Addendum  created 12/24/17 1457 by Mickel Baas, CRNA   Charge Capture section accepted, Child order released for a procedure order, Intraprocedure Blocks edited, Intraprocedure Flowsheets edited, Sign clinical note

## 2017-12-24 NOTE — Op Note (Signed)
[facility] Patient Name: Charles Savage Procedure Date: 12/24/2017 1:56 PM MRN: [id number] Date of Birth: Apr 27, 1956 Attending MD: Norvel Richards , MD CSN: [id number] Age: 62 Admit Type: Outpatient Procedure:                Upper GI endoscopy Indications:              Dysphagia Providers:                Norvel Richards, MD, Jeanann Lewandowsky. Sharon Seller, RN,                            Randa Spike, Technician Referring MD:             Halford Chessman MD, MD Medicines:                Propofol per Anesthesia Complications:            No immediate complications. Estimated Blood Loss:     Estimated blood loss was minimal. Procedure:                Pre-Anesthesia Assessment:                           - Prior to the procedure, a History and Physical                            was performed, and patient medications and                            allergies were reviewed. The patient's tolerance of                            previous anesthesia was also reviewed. The risks                            and benefits of the procedure and the sedation                            options and risks were discussed with the patient.                            All questions were answered, and informed consent                            was obtained. Prior Anticoagulants: The patient has                            taken no previous anticoagulant or antiplatelet                            agents. ASA Grade Assessment: II - A patient with                            mild systemic disease. After reviewing the risks  and benefits, the patient was deemed in                            satisfactory condition to undergo the procedure.                           After obtaining informed consent, the endoscope was                            passed under direct vision. Throughout the                            procedure, the patient's blood pressure, pulse, and    oxygen saturations were monitored continuously. The                            EG-2990I ([id number]) scope was introduced through the                            and advanced to the second part of duodenum. The                            EG-2990I ([id number]) scope was introduced through the                            and advanced to the. The upper GI endoscopy was                            accomplished without difficulty. The patient                            tolerated the procedure well. Scope In: 2:09:10 PM Scope Out: 2:22:57 PM Total Procedure Duration: 0 hours 13 minutes 47 seconds  Findings:      A moderate Schatzki ring was found at the gastroesophageal junction. The       scope was withdrawn. Dilation was performed with a Maloney dilator with       mild resistance at 37 Fr. The dilation site was examined following       endoscope reinsertion and showed no change. Estimated blood loss was       minimal. 2 quadrant bites of the ring were taken with the biopsy forceps       to disrupted. This was done effectively without apparent complication.      A small hiatal hernia was present.      The exam was otherwise without abnormality.      The duodenal bulb and second portion of the duodenum were normal. Impression:               - Moderate Schatzki ring. Dilated.                           - Small hiatal hernia.                           - The examination was otherwise normal.                           -  Normal duodenal bulb and second portion of the                            duodenum.                           - No specimens collected. Moderate Sedation:      Moderate (conscious) sedation was personally administered by an       anesthesia professional. The following parameters were monitored: oxygen       saturation, heart rate, blood pressure, and response to care. Total       physician intraservice time was 18 minutes. Recommendation:           - Patient has a contact number available  for                            emergencies. The signs and symptoms of potential                            delayed complications were discussed with the                            patient. Return to normal activities tomorrow.                            Written discharge instructions were provided to the                            patient.                           - Resume previous diet.                           - Continue present medications. Take Protonix 40 mg                            EVERYDAY.-- No repeat upper endoscopy.                           - Return to GI office in 3 months. Procedure Code(s):        --- Professional ---                           [phone], Esophagogastroduodenoscopy, flexible,                            transoral; diagnostic, including collection of                            specimen(s) by brushing or washing, when performed                            (separate procedure)                           43450, Dilation of  esophagus, by unguided sound or                            bougie, single or multiple passes Diagnosis Code(s):        --- Professional ---                           K22.2, Esophageal obstruction                           K44.9, Diaphragmatic hernia without obstruction or                            gangrene                           R13.10, Dysphagia, unspecified CPT copyright 2017 American Medical Association. All rights reserved. The codes documented in this report are preliminary and upon coder review may  be revised to meet current compliance requirements. Cristopher Estimable. Fallou Hulbert, MD Norvel Richards, MD 12/24/2017 2:32:30 PM This report has been signed electronically. Number of Addenda: 0

## 2017-12-24 NOTE — Anesthesia Postprocedure Evaluation (Signed)
Anesthesia Post Note  Patient: Charles Savage  Procedure(s) Performed: ESOPHAGOGASTRODUODENOSCOPY (EGD) WITH PROPOFOL (N/A ) MALONEY DILATION (N/A )  Patient location during evaluation: PACU Anesthesia Type: MAC Level of consciousness: awake and alert and oriented Pain management: pain level controlled Vital Signs Assessment: post-procedure vital signs reviewed and stable Respiratory status: spontaneous breathing Cardiovascular status: stable Postop Assessment: no apparent nausea or vomiting Anesthetic complications: no     Last Vitals:  Vitals:   12/24/17 1125 12/24/17 1430  BP: 129/73 (P) 125/72  Pulse: 60 (P) 72  Resp: (!) 25 (P) 14  Temp: 36.4 C (P) 36.6 C  SpO2:  (P) 90%    Last Pain:  Vitals:   12/24/17 1403  TempSrc:   PainSc: 10-Worst pain ever                 ADAMS, AMY A

## 2017-12-24 NOTE — Discharge Instructions (Signed)
EGD Discharge instructions Please read the instructions outlined below and refer to this sheet in the next few weeks. These discharge instructions provide you with general information on caring for yourself after you leave the hospital. Your doctor may also give you specific instructions. While your treatment has been planned according to the most current medical practices available, unavoidable complications occasionally occur. If you have any problems or questions after discharge, please call your doctor. ACTIVITY  You may resume your regular activity but move at a slower pace for the next 24 hours.   Take frequent rest periods for the next 24 hours.   Walking will help expel (get rid of) the air and reduce the bloated feeling in your abdomen.   No driving for 24 hours (because of the anesthesia (medicine) used during the test).   You may shower.   Do not sign any important legal documents or operate any machinery for 24 hours (because of the anesthesia used during the test).  NUTRITION  Drink plenty of fluids.   You may resume your normal diet.   Begin with a light meal and progress to your normal diet.   Avoid alcoholic beverages for 24 hours or as instructed by your caregiver.  MEDICATIONS  You may resume your normal medications unless your caregiver tells you otherwise.  WHAT YOU CAN EXPECT TODAY  You may experience abdominal discomfort such as a feeling of fullness or gas pains.  FOLLOW-UP  Your doctor will discuss the results of your test with you.  SEEK IMMEDIATE MEDICAL ATTENTION IF ANY OF THE FOLLOWING OCCUR:  Excessive nausea (feeling sick to your stomach) and/or vomiting.   Severe abdominal pain and distention (swelling).   Trouble swallowing.   Temperature over 101 F (37.8 C).   Rectal bleeding or vomiting of blood.   Gastroesophageal Reflux Disease, Adult Normally, food travels down the esophagus and stays in the stomach to be digested. If a person  has gastroesophageal reflux disease (GERD), food and stomach acid move back up into the esophagus. When this happens, the esophagus becomes sore and swollen (inflamed). Over time, GERD can make small holes (ulcers) in the lining of the esophagus. Follow these instructions at home: Diet  Follow a diet as told by your doctor. You may need to avoid foods and drinks such as: ? Coffee and tea (with or without caffeine). ? Drinks that contain alcohol. ? Energy drinks and sports drinks. ? Carbonated drinks or sodas. ? Chocolate and cocoa. ? Peppermint and mint flavorings. ? Garlic and onions. ? Horseradish. ? Spicy and acidic foods, such as peppers, chili powder, curry powder, vinegar, hot sauces, and BBQ sauce. ? Citrus fruit juices and citrus fruits, such as oranges, lemons, and limes. ? Tomato-based foods, such as red sauce, chili, salsa, and pizza with red sauce. ? Fried and fatty foods, such as donuts, french fries, potato chips, and high-fat dressings. ? High-fat meats, such as hot dogs, rib eye steak, sausage, ham, and bacon. ? High-fat dairy items, such as whole milk, butter, and cream cheese.  Eat small meals often. Avoid eating large meals.  Avoid drinking large amounts of liquid with your meals.  Avoid eating meals during the 2-3 hours before bedtime.  Avoid lying down right after you eat.  Do not exercise right after you eat. General instructions  Pay attention to any changes in your symptoms.  Take over-the-counter and prescription medicines only as told by your doctor. Do not take aspirin, ibuprofen, or other NSAIDs unless  your doctor says it is okay.  Do not use any tobacco products, including cigarettes, chewing tobacco, and e-cigarettes. If you need help quitting, ask your doctor.  Wear loose clothes. Do not wear anything tight around your waist.  Raise (elevate) the head of your bed about 6 inches (15 cm).  Try to lower your stress. If you need help doing this, ask  your doctor.  If you are overweight, lose an amount of weight that is healthy for you. Ask your doctor about a safe weight loss goal.  Keep all follow-up visits as told by your doctor. This is important. Contact a doctor if:  You have new symptoms.  You lose weight and you do not know why it is happening.  You have trouble swallowing, or it hurts to swallow.  You have wheezing or a cough that keeps happening.  Your symptoms do not get better with treatment.  You have a hoarse voice. Get help right away if:  You have pain in your arms, neck, jaw, teeth, or back.  You feel sweaty, dizzy, or light-headed.  You have chest pain or shortness of breath.  You throw up (vomit) and your throw up looks like blood or coffee grounds.  You pass out (faint).  Your poop (stool) is bloody or black.  You cannot swallow, drink, or eat. This information is not intended to replace advice given to you by your health care provider. Make sure you discuss any questions you have with your health care provider. Document Released: 11/29/2007 Document Revised: 11/18/2015 Document Reviewed: 10/07/2014 Elsevier Interactive Patient Education  2018 [employer].    GERD information provided  Take Protonix 40 mg each morning. Take every day.  Office follow-up with [place] in 3 months.

## 2017-12-28 ENCOUNTER — Encounter (HOSPITAL_COMMUNITY): Payer: Self-pay | Admitting: Internal Medicine

## 2018-01-03 DIAGNOSIS — S2096XA Insect bite (nonvenomous) of unspecified parts of thorax, initial encounter: Secondary | ICD-10-CM | POA: Diagnosis not present

## 2018-01-03 DIAGNOSIS — Z6829 Body mass index (BMI) 29.0-29.9, adult: Secondary | ICD-10-CM | POA: Diagnosis not present

## 2018-01-03 DIAGNOSIS — R0781 Pleurodynia: Secondary | ICD-10-CM | POA: Diagnosis not present

## 2018-01-03 DIAGNOSIS — M519 Unspecified thoracic, thoracolumbar and lumbosacral intervertebral disc disorder: Secondary | ICD-10-CM | POA: Diagnosis not present

## 2018-01-06 DIAGNOSIS — G4730 Sleep apnea, unspecified: Secondary | ICD-10-CM | POA: Diagnosis not present

## 2018-01-29 DIAGNOSIS — G4733 Obstructive sleep apnea (adult) (pediatric): Secondary | ICD-10-CM | POA: Diagnosis not present

## 2018-02-13 DIAGNOSIS — J9690 Respiratory failure, unspecified, unspecified whether with hypoxia or hypercapnia: Secondary | ICD-10-CM | POA: Diagnosis not present

## 2018-02-13 DIAGNOSIS — R0602 Shortness of breath: Secondary | ICD-10-CM | POA: Diagnosis not present

## 2018-02-13 DIAGNOSIS — J1520 Pneumonia due to staphylococcus, unspecified: Secondary | ICD-10-CM | POA: Diagnosis not present

## 2018-02-13 DIAGNOSIS — G4733 Obstructive sleep apnea (adult) (pediatric): Secondary | ICD-10-CM | POA: Diagnosis not present

## 2018-02-13 DIAGNOSIS — I509 Heart failure, unspecified: Secondary | ICD-10-CM | POA: Diagnosis not present

## 2018-03-11 ENCOUNTER — Ambulatory Visit (INDEPENDENT_AMBULATORY_CARE_PROVIDER_SITE_OTHER): Payer: Medicare HMO | Admitting: *Deleted

## 2018-03-11 DIAGNOSIS — I5022 Chronic systolic (congestive) heart failure: Secondary | ICD-10-CM

## 2018-03-11 DIAGNOSIS — I255 Ischemic cardiomyopathy: Secondary | ICD-10-CM | POA: Diagnosis not present

## 2018-03-11 NOTE — Progress Notes (Signed)
Remote ICD transmission.   

## 2018-03-16 DIAGNOSIS — I509 Heart failure, unspecified: Secondary | ICD-10-CM | POA: Diagnosis not present

## 2018-03-16 DIAGNOSIS — R0602 Shortness of breath: Secondary | ICD-10-CM | POA: Diagnosis not present

## 2018-03-16 DIAGNOSIS — G4733 Obstructive sleep apnea (adult) (pediatric): Secondary | ICD-10-CM | POA: Diagnosis not present

## 2018-03-16 DIAGNOSIS — J9690 Respiratory failure, unspecified, unspecified whether with hypoxia or hypercapnia: Secondary | ICD-10-CM | POA: Diagnosis not present

## 2018-03-16 DIAGNOSIS — J1520 Pneumonia due to staphylococcus, unspecified: Secondary | ICD-10-CM | POA: Diagnosis not present

## 2018-03-20 DIAGNOSIS — J1520 Pneumonia due to staphylococcus, unspecified: Secondary | ICD-10-CM | POA: Diagnosis not present

## 2018-03-20 DIAGNOSIS — J9690 Respiratory failure, unspecified, unspecified whether with hypoxia or hypercapnia: Secondary | ICD-10-CM | POA: Diagnosis not present

## 2018-03-20 DIAGNOSIS — I509 Heart failure, unspecified: Secondary | ICD-10-CM | POA: Diagnosis not present

## 2018-03-20 DIAGNOSIS — G4733 Obstructive sleep apnea (adult) (pediatric): Secondary | ICD-10-CM | POA: Diagnosis not present

## 2018-03-20 DIAGNOSIS — R0602 Shortness of breath: Secondary | ICD-10-CM | POA: Diagnosis not present

## 2018-03-26 ENCOUNTER — Encounter: Payer: Self-pay | Admitting: Gastroenterology

## 2018-03-26 ENCOUNTER — Ambulatory Visit (INDEPENDENT_AMBULATORY_CARE_PROVIDER_SITE_OTHER): Payer: Medicare HMO | Admitting: Gastroenterology

## 2018-03-26 DIAGNOSIS — K222 Esophageal obstruction: Secondary | ICD-10-CM | POA: Diagnosis not present

## 2018-03-26 NOTE — Assessment & Plan Note (Signed)
Schatzki ring status post dilation.  Doing well.  Continue pantoprazole 40 mg daily.  Return to the office as needed.

## 2018-03-26 NOTE — Progress Notes (Signed)
Primary Care Physician: Sharilyn Sites, MD  Primary Gastroenterologist:  Garfield Cornea, MD   Chief Complaint  Patient presents with  . Dysphagia    pp f/u. Doing fine    HPI: Charles Savage is a 62 y.o. male here for follow-up of EGD.  He was seen back in April with dysphagia to solid foods.  Near obstruction several times.  Heartburn as well.  History of non-small cell lung cancer status post radiation.  EGD in July showed moderate Schatzki ring status post dilation.  Presents today stating that he feels well.  Swallowing is much improved.  No typical reflux.  Taking pantoprazole 40 mg daily.  No abdominal pain.  Bowel movements are regular.  No blood in the stool or melena.   Current Outpatient Medications  Medication Sig Dispense Refill  . aspirin 81 MG tablet Take 81 mg by mouth daily.    Marland Kitchen atorvastatin (LIPITOR) 80 MG tablet TAKE ONE (1) TABLET EACH DAY (Patient taking differently: Take 80 mg by mouth every evening. ) 90 tablet 3  . carvedilol (COREG) 12.5 MG tablet Take 1-2 tablets (12.5-25 mg total) by mouth See admin instructions. Take 12.5 mg am and 25 mg ( 2 tablets) in the pm 270 tablet 3  . celecoxib (CELEBREX) 200 MG capsule Take 200 mg by mouth daily.     Marland Kitchen EPIPEN 2-PAK 0.3 MG/0.3ML SOAJ injection Inject 0.3 mg as directed daily as needed (for anaphylactic reactions.).     Marland Kitchen escitalopram (LEXAPRO) 20 MG tablet Take 20 mg by mouth daily.    . furosemide (LASIX) 20 MG tablet Take 10 mg by mouth daily as needed. For swelling/fluid retention.    Marland Kitchen HYDROcodone-acetaminophen (NORCO) 10-325 MG per tablet Take 1 tablet by mouth every 8 (eight) hours as needed. Pain (Patient taking differently: Take 1 tablet by mouth every 4 (four) hours as needed for moderate pain. ) 21 tablet 0  . levocetirizine (XYZAL) 5 MG tablet Take 5 mg by mouth at bedtime as needed for allergies.    . pantoprazole (PROTONIX) 40 MG tablet Take 40 mg by mouth daily as needed (for acid reflux).     Marland Kitchen  spironolactone (ALDACTONE) 25 MG tablet Take 12.5 mg by mouth daily as needed. For swelling/fluid retention.     No current facility-administered medications for this visit.     Allergies as of 03/26/2018 - Review Complete 03/26/2018  Allergen Reaction Noted  . Metoprolol Shortness Of Breath and Nausea And Vomiting 10/09/2011  . Peach flavor Hives, Itching, and Other (See Comments) 10/09/2011  . Morphine and related Hives and Itching 09/02/2010  . Bee venom Hives and Swelling 11/10/2016    ROS:  General: Negative for anorexia, weight loss, fever, chills, fatigue, weakness. ENT: Negative for hoarseness, difficulty swallowing , nasal congestion. CV: Negative for chest pain, angina, palpitations, dyspnea on exertion, peripheral edema.  Respiratory: Negative for dyspnea at rest, dyspnea on exertion, cough, sputum, wheezing.  GI: See history of present illness. GU:  Negative for dysuria, hematuria, urinary incontinence, urinary frequency, nocturnal urination.  Endo: Negative for unusual weight change.    Physical Examination:   BP 124/77   Pulse 67   Temp (!) 97 F (36.1 C) (Oral)   Ht 5\' 11"  (1.803 m)   Wt 209 lb 9.6 oz (95.1 kg)   BMI 29.23 kg/m   General: Well-nourished, well-developed in no acute distress.  Eyes: No icterus. Mouth: Oropharyngeal mucosa moist and pink , no lesions erythema  or exudate. Lungs: Clear to auscultation bilaterally.  Heart: Regular rate and rhythm, no murmurs rubs or gallops.  Abdomen: Bowel sounds are normal, nontender, nondistended, no hepatosplenomegaly or masses, no abdominal bruits or hernia , no rebound or guarding.   Extremities: No lower extremity edema. No clubbing or deformities. Neuro: Alert and oriented x 4   Skin: Warm and dry, no jaundice.   Psych: Alert and cooperative, normal mood and affect.

## 2018-03-26 NOTE — Progress Notes (Signed)
CC'D TO PCP °

## 2018-03-26 NOTE — Patient Instructions (Signed)
Continue pantoprazole once daily before breakfast.  If you have recurrent problems swallowing please let us know.  Call if you have any questions or concerns.

## 2018-04-03 LAB — CUP PACEART REMOTE DEVICE CHECK
Battery Remaining Longevity: 82 mo
Battery Voltage: 3.01 V
Date Time Interrogation Session: 20190916041607
HighPow Impedance: 65 Ohm
Implantable Lead Location: 753860
Implantable Lead Model: 6935
Implantable Pulse Generator Implant Date: 20131220
Lead Channel Impedance Value: 418 Ohm
Lead Channel Pacing Threshold Pulse Width: 0.4 ms
Lead Channel Sensing Intrinsic Amplitude: 4.75 mV
Lead Channel Setting Pacing Amplitude: 2 V
Lead Channel Setting Sensing Sensitivity: 0.3 mV
MDC IDC LEAD IMPLANT DT: 20131220
MDC IDC MSMT LEADCHNL RV IMPEDANCE VALUE: 361 Ohm
MDC IDC MSMT LEADCHNL RV PACING THRESHOLD AMPLITUDE: 0.75 V
MDC IDC MSMT LEADCHNL RV SENSING INTR AMPL: 4.75 mV
MDC IDC SET LEADCHNL RV PACING PULSEWIDTH: 0.4 ms
MDC IDC STAT BRADY RV PERCENT PACED: 0.01 %

## 2018-04-09 DIAGNOSIS — J449 Chronic obstructive pulmonary disease, unspecified: Secondary | ICD-10-CM | POA: Diagnosis not present

## 2018-04-09 DIAGNOSIS — E6609 Other obesity due to excess calories: Secondary | ICD-10-CM | POA: Diagnosis not present

## 2018-04-09 DIAGNOSIS — F17200 Nicotine dependence, unspecified, uncomplicated: Secondary | ICD-10-CM | POA: Diagnosis not present

## 2018-04-09 DIAGNOSIS — Z719 Counseling, unspecified: Secondary | ICD-10-CM | POA: Diagnosis not present

## 2018-04-09 DIAGNOSIS — E119 Type 2 diabetes mellitus without complications: Secondary | ICD-10-CM | POA: Diagnosis not present

## 2018-04-09 DIAGNOSIS — Z6832 Body mass index (BMI) 32.0-32.9, adult: Secondary | ICD-10-CM | POA: Diagnosis not present

## 2018-04-15 DIAGNOSIS — G4733 Obstructive sleep apnea (adult) (pediatric): Secondary | ICD-10-CM | POA: Diagnosis not present

## 2018-04-15 DIAGNOSIS — J9690 Respiratory failure, unspecified, unspecified whether with hypoxia or hypercapnia: Secondary | ICD-10-CM | POA: Diagnosis not present

## 2018-04-15 DIAGNOSIS — J1520 Pneumonia due to staphylococcus, unspecified: Secondary | ICD-10-CM | POA: Diagnosis not present

## 2018-04-15 DIAGNOSIS — I509 Heart failure, unspecified: Secondary | ICD-10-CM | POA: Diagnosis not present

## 2018-04-15 DIAGNOSIS — R0602 Shortness of breath: Secondary | ICD-10-CM | POA: Diagnosis not present

## 2018-04-17 DIAGNOSIS — Z23 Encounter for immunization: Secondary | ICD-10-CM | POA: Diagnosis not present

## 2018-04-17 DIAGNOSIS — E6609 Other obesity due to excess calories: Secondary | ICD-10-CM | POA: Diagnosis not present

## 2018-04-17 DIAGNOSIS — Z6830 Body mass index (BMI) 30.0-30.9, adult: Secondary | ICD-10-CM | POA: Diagnosis not present

## 2018-04-17 DIAGNOSIS — Z125 Encounter for screening for malignant neoplasm of prostate: Secondary | ICD-10-CM | POA: Diagnosis not present

## 2018-04-17 DIAGNOSIS — E782 Mixed hyperlipidemia: Secondary | ICD-10-CM | POA: Diagnosis not present

## 2018-04-17 DIAGNOSIS — I779 Disorder of arteries and arterioles, unspecified: Secondary | ICD-10-CM | POA: Diagnosis not present

## 2018-04-17 DIAGNOSIS — R944 Abnormal results of kidney function studies: Secondary | ICD-10-CM | POA: Diagnosis not present

## 2018-04-17 DIAGNOSIS — Z1389 Encounter for screening for other disorder: Secondary | ICD-10-CM | POA: Diagnosis not present

## 2018-04-17 DIAGNOSIS — Z719 Counseling, unspecified: Secondary | ICD-10-CM | POA: Diagnosis not present

## 2018-04-17 DIAGNOSIS — E785 Hyperlipidemia, unspecified: Secondary | ICD-10-CM | POA: Diagnosis not present

## 2018-04-17 DIAGNOSIS — E119 Type 2 diabetes mellitus without complications: Secondary | ICD-10-CM | POA: Diagnosis not present

## 2018-04-17 DIAGNOSIS — R7309 Other abnormal glucose: Secondary | ICD-10-CM | POA: Diagnosis not present

## 2018-04-23 ENCOUNTER — Ambulatory Visit (INDEPENDENT_AMBULATORY_CARE_PROVIDER_SITE_OTHER): Payer: Medicare HMO | Admitting: Student

## 2018-04-23 ENCOUNTER — Encounter: Payer: Self-pay | Admitting: Student

## 2018-04-23 ENCOUNTER — Encounter: Payer: Self-pay | Admitting: *Deleted

## 2018-04-23 VITALS — BP 130/78 | HR 57 | Ht 71.0 in | Wt 206.6 lb

## 2018-04-23 DIAGNOSIS — I25119 Atherosclerotic heart disease of native coronary artery with unspecified angina pectoris: Secondary | ICD-10-CM | POA: Diagnosis not present

## 2018-04-23 DIAGNOSIS — R0609 Other forms of dyspnea: Secondary | ICD-10-CM

## 2018-04-23 DIAGNOSIS — I059 Rheumatic mitral valve disease, unspecified: Secondary | ICD-10-CM | POA: Diagnosis not present

## 2018-04-23 DIAGNOSIS — I255 Ischemic cardiomyopathy: Secondary | ICD-10-CM

## 2018-04-23 DIAGNOSIS — I10 Essential (primary) hypertension: Secondary | ICD-10-CM | POA: Diagnosis not present

## 2018-04-23 DIAGNOSIS — I739 Peripheral vascular disease, unspecified: Secondary | ICD-10-CM

## 2018-04-23 DIAGNOSIS — E782 Mixed hyperlipidemia: Secondary | ICD-10-CM

## 2018-04-23 NOTE — Progress Notes (Signed)
Cardiology Office Note    Date:  04/23/2018   ID:  QAADIR KENT, DOB 1956/05/21, MRN [id number]  PCP:  Sharilyn Sites, MD  Cardiologist: Carlyle Dolly, MD   EP: Dr. Lovena Le  Chief Complaint  Patient presents with  . Follow-up    6 month visit    History of Present Illness:    Charles Savage is a 62 y.o. male with past medical history of CAD (s/p CABG in 09/2011 with LIMA-LAD, Seq SVG-OM1-OM2-OM3, SVG-PDA, and SVG-RCA), ischemic cardiomyopathy (EF previously 15-20% in 2013, s/p Medtronic ICD placement with repeat echo in 2017 showing EF had improved to 55-60%), severe MR (s/p repair with annuloplasty ring in 09/2011), HTN, HLD, OSA, carotid artery stenosis, CVA, and history of lung cancer who presents to the office today for 6 month follow-up.   He was last examined by Dr. Harl Bowie in 09/2017 and denied any recent chest pain or dyspnea on exertion. ICD had recently been interrogated and showed normal device function. He was continued on his current medication regimen at that time and informed to follow-up in 6 months.  In talking with the patient and his wife today, he reports having experienced worsening dyspnea on exertion over the past 4-6 weeks. He has now started to experience symptoms when walking around his home. Denies any associated chest pain but says he did not experience pain at the time of CABG in 2013 and dyspnea was his main symptom at that time. Denies any associated orthopnea, PND, lower extremity edema, or palpitations. Weight has been stable on his home scales. His wife mentions labs were recently obtained by his PCP and they were informed his kidney function was "abnormal", therefore Lasix and Spironolactone were reduced from daily to PRN. Lisinopril had been stopped by his PCP the month prior due to a dry cough. This has resolved with discontinuation of the medication.   He also reports pain along his lower extremities with walking, improving with rest. Denies any  history of non-healing wounds along his lower extremities.   Past Medical History:  Diagnosis Date  . 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 Arthritis    "back; knees" (06/14/2012)  . Cerebrovascular disease    h/o CVA in 07/2011 + left carotid endarterectomy  . Cholelithiasis 07/05/2012   Asymptomatic; identified on CT scanning of the chest;  . Chronic lower back pain   . COPD (chronic obstructive pulmonary disease) ([place])    on 2L O2 at home since 08/2011  . Coronary artery disease    a. s/p CABG in 09/2011 with LIMA-LAD, Seq SVG-OM1-OM2-OM3, SVG-PDA, and SVG-RCA  . DDD (degenerative disc disease), lumbosacral   . Depression 09/2011   "after OHS" (06/14/2012)  . Hyperlipemia   . ICD (implantable cardiac defibrillator) in place   . Ischemic cardiomyopathy    a. EF previously 15-20% in 2013, s/p Medtronic ICD placement b. repeat echo in 2017 showing EF had improved to 55-60%)  . Lung cancer ([place]) 2018   Radiation treatment  . Myocardial infarction ([place]) 08/2011  . Other primary cardiomyopathies   . Pneumonia   . Sleep apnea   . Stroke [facility]) 07/2011   denies residual (06/14/2012)    Past Surgical History:  Procedure Laterality Date  . BACK SURGERY    . CARDIAC CATHETERIZATION  09/2011  . CARDIAC DEFIBRILLATOR PLACEMENT  06/14/2012  . CAROTID ENDARTERECTOMY  07/2011   "left" (06/14/2012)  . COLONOSCOPY N/A 10/16/2014  Dr. Gala Romney: rectal polyps removed (hyperplastic). next TCS 10 years.   . CORONARY ARTERY BYPASS GRAFT  10/20/2011   Procedure: CORONARY ARTERY BYPASS GRAFTING (CABG);  Surgeon: [name], MD;  Location: [place];  Service: Open Heart Surgery;  Laterality: N/A;  CABG x six;  using left internal mammary artery and right leg greater saphenous vein harvested endoscopically  . ESOPHAGOGASTRODUODENOSCOPY (EGD) WITH PROPOFOL N/A 11/12/2017   Procedure: ESOPHAGOGASTRODUODENOSCOPY (EGD) WITH PROPOFOL;  Surgeon: Daneil Dolin, MD;  [facility];  Service: Endoscopy;  Laterality: N/A;  7:30am  . ESOPHAGOGASTRODUODENOSCOPY (EGD) WITH PROPOFOL N/A 12/24/2017   Procedure: ESOPHAGOGASTRODUODENOSCOPY (EGD) WITH PROPOFOL;  Surgeon: Daneil Dolin, MD;  [facility];  Service: Endoscopy;  Laterality: N/A;  1:00pm  . FOREIGN BODY REMOVAL  06/05/2011   Procedure: FOREIGN BODY REMOVAL ADULT;  Surgeon: Hermelinda Dellen;  Location: [facility];  Service: Plastics;  Laterality: Right;  removal foreign body from right side face   . IMPLANTABLE CARDIOVERTER DEFIBRILLATOR IMPLANT N/A 06/14/2012   Procedure: IMPLANTABLE CARDIOVERTER DEFIBRILLATOR IMPLANT;  Surgeon: Evans Lance, MD;  Location: [facility] CATH LAB;  Service: Cardiovascular;  Laterality: N/A;  . KNEE ARTHROSCOPY  2002   "right" (06/14/2012)  . LEFT AND RIGHT HEART CATHETERIZATION WITH CORONARY ANGIOGRAM N/A 10/16/2011   Procedure: LEFT AND RIGHT HEART CATHETERIZATION WITH CORONARY ANGIOGRAM;  Surgeon: Jolaine Artist, MD;  Location[facility] CATH LAB;  Service: Cardiovascular;  Laterality: N/A;  . LUMBAR [place] SURGERY  2012  . MALONEY DILATION N/A 12/24/2017   Procedure: Venia Minks DILATION;  Surgeon: Daneil Dolin, MD;  [facility];  Service: Endoscopy;  Laterality: N/A;  . MITRAL VALVE REPAIR  10/20/2011   Procedure: MITRAL VALVE REPAIR (MVR);  Surgeon: [name], MD;  Location: [place];  Service: Open Heart Surgery;  Laterality: N/A;  . POSTERIOR LUMBAR FUSION  1996  . TEE WITHOUT CARDIOVERSION  10/17/2011   Procedure: TRANSESOPHAGEAL ECHOCARDIOGRAM (TEE);  Surgeon: Jolaine Artist, MD;  Location: [facility];  Service: Cardiovascular;  Laterality: N/A;    Current Medications: Outpatient Medications Prior to Visit  Medication Sig Dispense Refill  . aspirin 81 MG tablet Take 81 mg by mouth daily.    Marland Kitchen atorvastatin (LIPITOR) 80 MG tablet TAKE ONE (1) TABLET EACH DAY (Patient taking differently: Take 80 mg by mouth  every evening. ) 90 tablet 3  . carvedilol (COREG) 12.5 MG tablet Take 1-2 tablets (12.5-25 mg total) by mouth See admin instructions. Take 12.5 mg am and 25 mg ( 2 tablets) in the pm 270 tablet 3  . celecoxib (CELEBREX) 200 MG capsule Take 200 mg by mouth daily.     Marland Kitchen EPIPEN 2-PAK 0.3 MG/0.3ML SOAJ injection Inject 0.3 mg as directed daily as needed (for anaphylactic reactions.).     Marland Kitchen escitalopram (LEXAPRO) 20 MG tablet Take 20 mg by mouth daily.    . furosemide (LASIX) 20 MG tablet Take 10 mg by mouth daily as needed. For swelling/fluid retention.    Marland Kitchen HYDROcodone-acetaminophen (NORCO) 10-325 MG per tablet Take 1 tablet by mouth every 8 (eight) hours as needed. Pain (Patient taking differently: Take 1 tablet by mouth every 4 (four) hours as needed for moderate pain. ) 21 tablet 0  . levocetirizine (XYZAL) 5 MG tablet Take 5 mg by mouth at bedtime as needed for allergies.    . pantoprazole (PROTONIX) 40 MG tablet Take 40 mg by mouth daily before breakfast.     .  spironolactone (ALDACTONE) 25 MG tablet Take 12.5 mg by mouth daily as needed. For swelling/fluid retention.     No facility-administered medications prior to visit.      Allergies:   Metoprolol; Peach flavor; Morphine and related; Bee venom; and Lisinopril   Social History   Socioeconomic History  . Marital status: Married    Spouse name: Not on file  . Number of children: Not on file  . Years of education: Not on file  . Highest education level: Not on file  Occupational History  . Not on file  Social Needs  . Financial resource strain: Not on file  . Food insecurity:    Worry: Not on file    Inability: Not on file  . Transportation needs:    Medical: Not on file    Non-medical: Not on file  Tobacco Use  . Smoking status: Current Every Day Smoker    Packs/day: 0.75    Years: 46.00    Pack years: 34.50    Types: Cigarettes    Start date: 12/12/1969  . Smokeless tobacco: Never Used  . Tobacco comment: currently  smoking 2 cigarettes per day  Substance and Sexual Activity  . 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 Drug use: No  . Sexual activity: Never    Comment: Quit drinking alcohol 2 yrs ago  Lifestyle  . Physical activity:    Days per week: Not on file    Minutes per session: Not on file  . Stress: Not on file  Relationships  . Social connections:    Talks on phone: Not on file    Gets together: Not on file    Attends religious service: Not on file    Active member of club or organization: Not on file    Attends meetings of clubs or organizations: Not on file    Relationship status: Not on file  Other Topics Concern  . 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  Lives with wife.     Family History:  The patient's family history includes Arrhythmia in his brother; Coronary artery disease in his father and mother.   Review of Systems:   Please see the history of present illness.     General:  No chills, fever, night sweats or weight changes.  Cardiovascular:  No chest pain, edema, orthopnea, palpitations, paroxysmal nocturnal dyspnea. Positive for dyspnea on exertion and claudication.  Dermatological: No rash, lesions/masses Respiratory: No cough, dyspnea Urologic: No hematuria, dysuria Abdominal:   No nausea, vomiting, diarrhea, bright red blood per rectum, melena, or hematemesis Neurologic:  No visual changes, wkns, changes in mental status. All other systems reviewed and are otherwise negative except as noted above.   Physical Exam:    VS:  BP 130/78   Pulse (!) 57   Ht 5\' 11"  (1.803 m)   Wt 206 lb 9.6 oz (93.7 kg)   SpO2 90%   BMI 28.81 kg/m    General: Well developed, well nourished Caucasian male appearing in no acute distress. Head: Normocephalic, atraumatic, sclera non-icteric, no xanthomas, nares are without discharge.  Neck: No carotid  bruits. JVD not elevated.  Lungs: Respirations regular and unlabored, without wheezes or rales.  Heart: Regular rate and rhythm. No S3 or S4.  No murmur, no rubs, or gallops appreciated. Abdomen: Soft, non-tender, non-distended  with normoactive bowel sounds. No hepatomegaly. No rebound/guarding. No obvious abdominal masses. Msk:  Strength and tone appear normal for age. No joint deformities or effusions. Extremities: No clubbing or cyanosis. No lower extremity edema.  Distal pedal pulses are 2+ bilaterally. Neuro: Alert and oriented X 3. Moves all extremities spontaneously. No focal deficits noted. Psych:  Responds to questions appropriately with a normal affect. Skin: No rashes or lesions noted  Wt Readings from Last 3 Encounters:  04/23/18 206 lb 9.6 oz (93.7 kg)  03/26/18 209 lb 9.6 oz (95.1 kg)  11/29/17 201 lb 9.6 oz (91.4 kg)     Studies/Labs Reviewed:   EKG:  EKG is ordered today.  The ekg ordered today demonstrates sinus bradycardia, HR 58, with no acute ST changes when compared to prior tracings.   Recent Labs: 11/05/2017: BUN 13; Creatinine, Ser 1.24; Hemoglobin 13.8; Platelets 342; Potassium 4.4; Sodium 135   Lipid Panel    Component Value Date/Time   CHOL 138 02/17/2013 0815   TRIG 229 (H) 02/17/2013 0815   HDL 28 (L) 02/17/2013 0815   CHOLHDL 4.9 02/17/2013 0815   VLDL 46 (H) 02/17/2013 0815   LDLCALC 64 02/17/2013 0815    Additional studies/ records that were reviewed today include:   Echocardiogram: 09/2015 Study Conclusions  - Left ventricle: The cavity size was mildly dilated. Wall   thickness was normal. Systolic function was normal. The estimated   ejection fraction was in the range of 55% to 60%. Wall motion was   normal; there were no regional wall motion abnormalities.   Features are consistent with a pseudonormal left ventricular   filling pattern, with concomitant abnormal relaxation and   increased filling pressure (grade 2 diastolic  dysfunction). - Aortic valve: There was mild regurgitation. - Mitral valve: Mildly thickened leaflets . There was trivial   regurgitation. Valve area by pressure half-time: 2.27 cm^2. - Left atrium: The atrium was mildly dilated. - Right ventricle: Pacer wire or catheter noted in right ventricle. - Right atrium: The atrium was mildly dilated. - Atrial septum: No defect or patent foramen ovale was identified. - Tricuspid valve: There was trivial regurgitation. - Pulmonary arteries: PA peak pressure: 34 mm Hg (S). - Pericardium, extracardiac: There was no pericardial effusion.  Impressions:  - Mildly dilated LV chamber size with LVEF 55-60%. Grade 2   diastolic dysfunction with increased LV filling pressure. Mild   left atrial enlargement. Status post mitral annuloplasty with   mildly thickened leaflets and trivial mitral regurgitation. Mild   aortic regurgitation. Device wire noted within the right heart.   Trivial tricuspid regurgitation with PASP 34 mmHg.  Assessment:    1. Coronary artery disease involving native coronary artery of native heart with angina pectoris ([place])   2. DOE (dyspnea on exertion)   3. Cardiomyopathy, ischemic   4. Mitral valve disease   5. Claudication ([place])   6. Essential hypertension   7. Mixed hyperlipidemia      Plan:   In order of problems listed above:  1. CAD/ Dyspnea on Exertion - he is s/p CABG in 09/2011 with LIMA-LAD, Seq SVG-OM1-OM2-OM3, SVG-PDA, and SVG-RCA. Reports progressive dyspnea on exertion over the past 4-6 weeks which resembles when he initially required CABG but not nearly as severe. He denies any associated chest pain. Appears euvolemic by examination today and weight has been stable on his home scales.  - will obtain a Lexiscan Myoview for ischemic evaluation. Lasix and Spironolactone were also switched to PRN by his  PCP a few weeks ago due to worsening kidney function. Will request these records.   2. Ischemic  cardiomyopathy - EF previously 15-20% in 2013, s/p Medtronic ICD placement with repeat echo in 2017 showing EF had improved to 55-60%. Device followed by Dr. Lovena Le and recent interrogation last month showed normal device function.  - continue BB therapy. Pending lab report from his PCP, would recommend restarting Losartan in place of Lisinopril as he developed a dry cough with ACE-I therapy which has since resolved with discontinuation of the medication.  3. Severe MR - s/p repair with annuloplasty ring in 09/2011. Echocardiogram in 2017 showed trivial MR.   4. Claudication - reports pain along his lower extremities with walking, improving with rest. Symptoms seem most concerning for claudication and given his known CAD and carotid artery stenosis, will obtain lower extremity ABI's and doppler studies.   5. HTN - BP is well-controlled at 130/78 during today's visit.  - continue current medication regimen with possible adjustments as outlined above pending lab results.   6. HLD - followed by PCP. Remains on Atorvastatin 80mg  daily. Goal LDL is < 70 with known CAD.   Medication Adjustments/Labs and Tests Ordered: Current medicines are reviewed at length with the patient today.  Concerns regarding medicines are outlined above.  Medication changes, Labs and Tests ordered today are listed in the Patient Instructions below. Patient Instructions  Medication Instructions:  Your physician recommends that you continue on your current medications as directed. Please refer to the Current Medication list given to you today.  If you need a refill on your cardiac medications before your next appointment, please call your pharmacy.   Lab work: NONE   If you have labs (blood work) drawn today and your tests are completely normal, you will receive your results only by: Marland Kitchen MyChart Message (if you have MyChart) OR . A paper copy in the mail If you have any lab test that is abnormal or we need to change  your treatment, we will call you to review the results.  Testing/Procedures: Your physician has requested that you have a lexiscan myoview. For further information please visit [email or web address]. Please follow instruction sheet, as given.  Your physician has requested that you have an ankle brachial index (ABI). During this test an ultrasound and blood pressure cuff are used to evaluate the arteries that supply the arms and legs with blood. Allow thirty minutes for this exam. There are no restrictions or special instructions.   Follow-Up: At [facility], you and your health needs are our priority.  As part of our continuing mission to provide you with exceptional heart care, we have created designated Provider Care Teams.  These Care Teams include your primary Cardiologist (physician) and Advanced Practice Providers (APPs -  Physician Assistants and Nurse Practitioners) who all work together to provide you with the care you need, when you need it. You will need a follow up appointment in [place].  Please call our office 2 months in advance to schedule this appointment.  You may see Carlyle Dolly, MD or one of the following Advanced Practice Providers on your designated Care Team:   Bernerd Pho, PA-C [facility]) . Ermalinda Barrios, PA-C ([place])  Any Other Special Instructions Will Be Listed Below (If Applicable). Thank you for choosing [place]!    Signed, Erma Heritage, PA-C  04/23/2018 8:23 PM    [facility] [address] [place], [place] [address] Phone: [phone]

## 2018-04-23 NOTE — Patient Instructions (Signed)
Medication Instructions:  Your physician recommends that you continue on your current medications as directed. Please refer to the Current Medication list given to you today.  If you need a refill on your cardiac medications before your next appointment, please call your pharmacy.   Lab work: NONE   If you have labs (blood work) drawn today and your tests are completely normal, you will receive your results only by: Marland Kitchen MyChart Message (if you have MyChart) OR . A paper copy in the mail If you have any lab test that is abnormal or we need to change your treatment, we will call you to review the results.  Testing/Procedures: Your physician has requested that you have a lexiscan myoview. For further information please visit [email or web address]. Please follow instruction sheet, as given.  Your physician has requested that you have an ankle brachial index (ABI). During this test an ultrasound and blood pressure cuff are used to evaluate the arteries that supply the arms and legs with blood. Allow thirty minutes for this exam. There are no restrictions or special instructions.   Follow-Up: At [facility], you and your health needs are our priority.  As part of our continuing mission to provide you with exceptional heart care, we have created designated Provider Care Teams.  These Care Teams include your primary Cardiologist (physician) and Advanced Practice Providers (APPs -  Physician Assistants and Nurse Practitioners) who all work together to provide you with the care you need, when you need it. You will need a follow up appointment in [place].  Please call our office 2 months in advance to schedule this appointment.  You may see Carlyle Dolly, MD or one of the following Advanced Practice Providers on your designated Care Team:   Bernerd Pho, PA-C [facility]) . Ermalinda Barrios, PA-C ([place])  Any Other Special Instructions Will Be Listed Below (If Applicable). Thank  you for choosing [place]!

## 2018-04-30 ENCOUNTER — Encounter (HOSPITAL_BASED_OUTPATIENT_CLINIC_OR_DEPARTMENT_OTHER)
Admission: RE | Admit: 2018-04-30 | Discharge: 2018-04-30 | Disposition: A | Discharge status: Home / Self Care | Payer: Medicare HMO | Source: Ambulatory Visit | Attending: Student | Admitting: Student

## 2018-04-30 ENCOUNTER — Ambulatory Visit (HOSPITAL_COMMUNITY)
Admission: RE | Admit: 2018-04-30 | Discharge: 2018-04-30 | Disposition: A | Discharge status: Home / Self Care | Payer: Medicare HMO | Source: Ambulatory Visit | Attending: Student | Admitting: Student

## 2018-04-30 ENCOUNTER — Encounter (HOSPITAL_COMMUNITY)
Admission: RE | Admit: 2018-04-30 | Discharge: 2018-04-30 | Disposition: A | Discharge status: Home / Self Care | Payer: Medicare HMO | Source: Ambulatory Visit | Attending: Student | Admitting: Student

## 2018-04-30 ENCOUNTER — Encounter (HOSPITAL_COMMUNITY): Payer: Self-pay

## 2018-04-30 DIAGNOSIS — E785 Hyperlipidemia, unspecified: Secondary | ICD-10-CM | POA: Diagnosis not present

## 2018-04-30 DIAGNOSIS — I739 Peripheral vascular disease, unspecified: Secondary | ICD-10-CM | POA: Diagnosis not present

## 2018-04-30 DIAGNOSIS — R0602 Shortness of breath: Secondary | ICD-10-CM | POA: Diagnosis not present

## 2018-04-30 DIAGNOSIS — I10 Essential (primary) hypertension: Secondary | ICD-10-CM | POA: Diagnosis not present

## 2018-04-30 DIAGNOSIS — R0609 Other forms of dyspnea: Secondary | ICD-10-CM | POA: Diagnosis not present

## 2018-04-30 DIAGNOSIS — I2510 Atherosclerotic heart disease of native coronary artery without angina pectoris: Secondary | ICD-10-CM | POA: Insufficient documentation

## 2018-04-30 DIAGNOSIS — I255 Ischemic cardiomyopathy: Secondary | ICD-10-CM | POA: Diagnosis not present

## 2018-04-30 LAB — NM MYOCAR MULTI W/SPECT W/WALL MOTION / EF
CHL CUP NUCLEAR SSS: 7
LHR: 0.41
LV sys vol: 64 mL
LVDIAVOL: 123 mL (ref 62–150)
Peak HR: 75 {beats}/min
Rest HR: 55 {beats}/min
SDS: 3
SRS: 4
TID: 1.1

## 2018-04-30 MED ORDER — SODIUM CHLORIDE 0.9% FLUSH
INTRAVENOUS | Status: AC
  Administered 2018-04-30: 10 mL via INTRAVENOUS
  Filled 2018-04-30: qty 10

## 2018-04-30 MED ORDER — TECHNETIUM TC 99M TETROFOSMIN IV KIT
10.0000 | PACK | Freq: Once | INTRAVENOUS | Status: AC | PRN
  Administered 2018-04-30: 11 via INTRAVENOUS

## 2018-04-30 MED ORDER — TECHNETIUM TC 99M TETROFOSMIN IV KIT
30.0000 | PACK | Freq: Once | INTRAVENOUS | Status: AC | PRN
  Administered 2018-04-30: 30 via INTRAVENOUS

## 2018-04-30 MED ORDER — REGADENOSON 0.4 MG/5ML IV SOLN
INTRAVENOUS | Status: AC
  Administered 2018-04-30: 0.4 mg via INTRAVENOUS
  Filled 2018-04-30: qty 5

## 2018-05-01 ENCOUNTER — Encounter: Payer: Self-pay | Admitting: *Deleted

## 2018-05-01 DIAGNOSIS — Z0001 Encounter for general adult medical examination with abnormal findings: Secondary | ICD-10-CM | POA: Diagnosis not present

## 2018-05-01 DIAGNOSIS — Z1389 Encounter for screening for other disorder: Secondary | ICD-10-CM | POA: Diagnosis not present

## 2018-05-01 DIAGNOSIS — Z6830 Body mass index (BMI) 30.0-30.9, adult: Secondary | ICD-10-CM | POA: Diagnosis not present

## 2018-05-01 DIAGNOSIS — E6609 Other obesity due to excess calories: Secondary | ICD-10-CM | POA: Diagnosis not present

## 2018-05-02 ENCOUNTER — Telehealth: Payer: Self-pay

## 2018-05-02 MED ORDER — ISOSORBIDE MONONITRATE ER 30 MG PO TB24: 15.0000 mg | ORAL_TABLET | Freq: Every day | ORAL | 3 refills | Status: DC

## 2018-05-02 NOTE — Telephone Encounter (Signed)
-----   Message from Erma Heritage, [place] sent at 05/01/2018  4:56 PM EST ----- Please let the patient know that his stress test showed areas of known prior infarctions which is expected given his prior CABG. He did have a mild area of ischemia (potential blockage) which could be contributing to his recent symptoms and consistent with an intermediate-risk study. I reviewed this with Dr. Harl Bowie and will plan to start Imdur 15 mg daily. This can affect blood pressure slightly and given his history of orthostatic hypotension, would recommend that he follow blood pressure over the next few weeks (checking a few times per week) and bring this to his follow-up visit.  If symptoms do not improve with medication adjustments or worsen, we can discuss plans for further evaluation including a repeat catheterization. Please forward a copy of results to Sharilyn Sites, MD. Thank you.

## 2018-05-02 NOTE — Telephone Encounter (Signed)
I spoke with wife (DPR) signed, gave her test results.Pt will start Imdur 15 mg daily, monitor BP and sx's and callus back if things worsen, knowing cath may be next alternative.

## 2018-05-08 DIAGNOSIS — T5991XA Toxic effect of unspecified gases, fumes and vapors, accidental (unintentional), initial encounter: Secondary | ICD-10-CM | POA: Diagnosis not present

## 2018-05-08 DIAGNOSIS — R05 Cough: Secondary | ICD-10-CM | POA: Diagnosis not present

## 2018-05-08 DIAGNOSIS — R0902 Hypoxemia: Secondary | ICD-10-CM | POA: Diagnosis not present

## 2018-05-08 DIAGNOSIS — R069 Unspecified abnormalities of breathing: Secondary | ICD-10-CM | POA: Diagnosis not present

## 2018-05-09 ENCOUNTER — Telehealth: Payer: Self-pay | Admitting: Cardiology

## 2018-05-09 NOTE — Telephone Encounter (Signed)
[id number] 

## 2018-05-09 NOTE — Telephone Encounter (Signed)
EMS repeated SaO2 88%, pt now is sleeping a lot and has productive white cough. I advised her to call pcp and get apt ASAP.She also wants patient to get inhalers and see a pulmonologist.I told her this is what she needs to discuss with Dr.Golding

## 2018-05-09 NOTE — Telephone Encounter (Signed)
[name] wife called to state that had to call EMS for patient yesterday and his O2 sat was 74. She would like to speak with nurse to discuss what course she needs to take. / tg

## 2018-05-10 ENCOUNTER — Other Ambulatory Visit (HOSPITAL_COMMUNITY): Payer: Self-pay | Admitting: Family Medicine

## 2018-05-10 ENCOUNTER — Ambulatory Visit (HOSPITAL_COMMUNITY)
Admission: RE | Admit: 2018-05-10 | Discharge: 2018-05-10 | Disposition: A | Discharge status: Home / Self Care | Payer: Medicare HMO | Source: Ambulatory Visit | Attending: Family Medicine | Admitting: Family Medicine

## 2018-05-10 DIAGNOSIS — J441 Chronic obstructive pulmonary disease with (acute) exacerbation: Secondary | ICD-10-CM

## 2018-05-10 DIAGNOSIS — R0602 Shortness of breath: Secondary | ICD-10-CM | POA: Diagnosis not present

## 2018-05-10 DIAGNOSIS — Z95810 Presence of automatic (implantable) cardiac defibrillator: Secondary | ICD-10-CM | POA: Insufficient documentation

## 2018-05-10 DIAGNOSIS — E6609 Other obesity due to excess calories: Secondary | ICD-10-CM | POA: Diagnosis not present

## 2018-05-10 DIAGNOSIS — J449 Chronic obstructive pulmonary disease, unspecified: Secondary | ICD-10-CM | POA: Diagnosis not present

## 2018-05-10 DIAGNOSIS — Z6831 Body mass index (BMI) 31.0-31.9, adult: Secondary | ICD-10-CM | POA: Diagnosis not present

## 2018-05-16 DIAGNOSIS — R0602 Shortness of breath: Secondary | ICD-10-CM | POA: Diagnosis not present

## 2018-05-16 DIAGNOSIS — G4733 Obstructive sleep apnea (adult) (pediatric): Secondary | ICD-10-CM | POA: Diagnosis not present

## 2018-05-16 DIAGNOSIS — J1520 Pneumonia due to staphylococcus, unspecified: Secondary | ICD-10-CM | POA: Diagnosis not present

## 2018-05-16 DIAGNOSIS — J9690 Respiratory failure, unspecified, unspecified whether with hypoxia or hypercapnia: Secondary | ICD-10-CM | POA: Diagnosis not present

## 2018-05-16 DIAGNOSIS — I509 Heart failure, unspecified: Secondary | ICD-10-CM | POA: Diagnosis not present

## 2018-05-20 ENCOUNTER — Telehealth: Payer: Self-pay | Admitting: Urology

## 2018-05-20 NOTE — Telephone Encounter (Signed)
Charles Savage wanted to make sure that spouse still was scheduled for his 12/6 appt with Ashlyn. I confirmed that it was. She states that pt will have CT chest done at [facility] due to insurance reasons. I asked her to have them make sure that they send [place] a copy so that we will have it to go over at the time of the appt. Pt agreeable.

## 2018-05-28 ENCOUNTER — Telehealth: Payer: Self-pay | Admitting: *Deleted

## 2018-05-28 NOTE — Telephone Encounter (Signed)
Called patient to alter fu appt. on 05-31-18 per Ashlyn Bruning request, appt. moved to 06-06-18, patient's wife- Pam agreed to this appt.

## 2018-05-29 DIAGNOSIS — J432 Centrilobular emphysema: Secondary | ICD-10-CM | POA: Diagnosis not present

## 2018-05-29 DIAGNOSIS — J90 Pleural effusion, not elsewhere classified: Secondary | ICD-10-CM | POA: Diagnosis not present

## 2018-05-29 DIAGNOSIS — Z85118 Personal history of other malignant neoplasm of bronchus and lung: Secondary | ICD-10-CM | POA: Diagnosis not present

## 2018-05-31 ENCOUNTER — Ambulatory Visit: Payer: Self-pay | Admitting: Urology

## 2018-06-04 ENCOUNTER — Ambulatory Visit (HOSPITAL_COMMUNITY): Payer: Medicare HMO

## 2018-06-04 ENCOUNTER — Other Ambulatory Visit: Payer: Self-pay | Admitting: Urology

## 2018-06-04 ENCOUNTER — Telehealth: Payer: Self-pay | Admitting: *Deleted

## 2018-06-04 DIAGNOSIS — C3431 Malignant neoplasm of lower lobe, right bronchus or lung: Secondary | ICD-10-CM

## 2018-06-04 NOTE — Progress Notes (Signed)
Cardiology Office Note    Date:  06/05/2018   ID:  EFRAIN CLAUSON, DOB Nov 05, 1955, MRN [id number]  PCP:  Sharilyn Sites, MD  Cardiologist: Carlyle Dolly, MD   EP: Dr. Lovena Le  Chief Complaint  Patient presents with  . Follow-up    6 week visit    History of Present Illness:    Charles Savage is a 62 y.o. male with past medical history of CAD (s/p CABG in 09/2011 with LIMA-LAD, Seq SVG-OM1-OM2-OM3, SVG-PDA, and SVG-RCA), ischemic cardiomyopathy (EF previously 15-20% in 2013, s/p Medtronic ICD placement with repeat echo in 2017 showing EF had improved to 55-60%), severe MR (s/p repair with annuloplasty ring in 09/2011), HTN, HLD, OSA, carotid artery stenosis, CVA, and history of lung cancer who presents to the office today for 6-week follow-up.  He was last examined by myself on 04/23/2018 and reported having worsening dyspnea on exertion over the past 4 to 6 weeks. He had also experienced a dry cough recently and Lisinopril was discontinued by his PCP but not replaced with another medication. Lasix and Spironolactone were also switched to PRN due to worsening renal function. A Lexiscan Myoview was obtained for initial assessment and showed findings consistent with scar and mild peri-infarct ischemia, overall being an intermediate risk study. This was reviewed with Dr. Harl Bowie and given his issues with orthostasis in the past, he was started on low-dose Imdur 15 mg daily with planned titration as tolerated. If he developed refractory or progressive symptoms, could then consider a cardiac catheterization.  In talking with the patient today, he reports overall doing well from a cardiac perspective since his last office visit. He reports being evaluated by his PCP in the interim and was started on PRN inhalers (presumably Albuterol but patient is unaware of the exact medication). He reports that his breathing has significantly improved and he denies any current dyspnea on exertion. Reports  walking from the main parking lot of hospital to the office today without significant symptoms. No recent chest pain, palpitations, orthopnea, PND, or lower extremity edema.  He does report having increased caloric intake over Thanksgiving week but says he has been trying to be mindful of his dietary habits since. He does not weigh himself regularly at home but weight has increased by 5 pounds since his last office visit 2 months ago.   Past Medical History:  Diagnosis Date  . 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 Arthritis    "back; knees" (06/14/2012)  . Cerebrovascular disease    h/o CVA in 07/2011 + left carotid endarterectomy  . CHF (congestive heart failure) ([place])   . Cholelithiasis 07/05/2012   Asymptomatic; identified on CT scanning of the chest;  . Chronic lower back pain   . COPD (chronic obstructive pulmonary disease) ([place])    on 2L O2 at home since 08/2011  . Coronary artery disease    a. s/p CABG in 09/2011 with LIMA-LAD, Seq SVG-OM1-OM2-OM3, SVG-PDA, and SVG-RCA  . DDD (degenerative disc disease), lumbosacral   . Depression 09/2011   "after OHS" (06/14/2012)  . Hyperlipemia   . ICD (implantable cardiac defibrillator) in place   . Ischemic cardiomyopathy    a. EF previously 15-20% in 2013, s/p Medtronic ICD placement b. repeat echo in 2017 showing EF had improved to 55-60%)  . Lung cancer ([place]) 2018   Radiation treatment  . Myocardial infarction ([place]) 08/2011  . Other primary cardiomyopathies   . Pneumonia   .  Sleep apnea   . Stroke [facility]) 07/2011   denies residual (06/14/2012)    Past Surgical History:  Procedure Laterality Date  . BACK SURGERY    . CARDIAC CATHETERIZATION  09/2011  . CARDIAC DEFIBRILLATOR PLACEMENT  06/14/2012  . CAROTID ENDARTERECTOMY  07/2011   "left" (06/14/2012)  . COLONOSCOPY N/A 10/16/2014   Dr. Gala Romney: rectal polyps removed (hyperplastic). next TCS 10 years.   . CORONARY ARTERY BYPASS  GRAFT  10/20/2011   Procedure: CORONARY ARTERY BYPASS GRAFTING (CABG);  Surgeon: [name], MD;  Location: [place];  Service: Open Heart Surgery;  Laterality: N/A;  CABG x six;  using left internal mammary artery and right leg greater saphenous vein harvested endoscopically  . ESOPHAGOGASTRODUODENOSCOPY (EGD) WITH PROPOFOL N/A 11/12/2017   Procedure: ESOPHAGOGASTRODUODENOSCOPY (EGD) WITH PROPOFOL;  Surgeon: Daneil Dolin, MD;  [facility];  Service: Endoscopy;  Laterality: N/A;  7:30am  . ESOPHAGOGASTRODUODENOSCOPY (EGD) WITH PROPOFOL N/A 12/24/2017   Procedure: ESOPHAGOGASTRODUODENOSCOPY (EGD) WITH PROPOFOL;  Surgeon: Daneil Dolin, MD;  [facility];  Service: Endoscopy;  Laterality: N/A;  1:00pm  . FOREIGN BODY REMOVAL  06/05/2011   Procedure: FOREIGN BODY REMOVAL ADULT;  Surgeon: Hermelinda Dellen;  Location: [place];  Service: Plastics;  Laterality: Right;  removal foreign body from right side face   . IMPLANTABLE CARDIOVERTER DEFIBRILLATOR IMPLANT N/A 06/14/2012   Procedure: IMPLANTABLE CARDIOVERTER DEFIBRILLATOR IMPLANT;  Surgeon: Evans Lance, MD;  Location: [facility] CATH LAB;  Service: Cardiovascular;  Laterality: N/A;  . KNEE ARTHROSCOPY  2002   "right" (06/14/2012)  . LEFT AND RIGHT HEART CATHETERIZATION WITH CORONARY ANGIOGRAM N/A 10/16/2011   Procedure: LEFT AND RIGHT HEART CATHETERIZATION WITH CORONARY ANGIOGRAM;  Surgeon: Jolaine Artist, MD;  Location: [facility] C[facility] LAB;  Service: Cardiovascular;  Laterality: N/A;  . LUMBAR [place] SURGERY  2012  . MALONEY DILATION N/A 12/24/2017   Procedure: Venia Minks DILATION;  Surgeon: Daneil Dolin, MD;  [facility];  Service: Endoscopy;  Laterality: N/A;  . MITRAL VALVE REPAIR  10/20/2011   Procedure: MITRAL VALVE REPAIR (MVR);  Surgeon: [name], MD;  Location: [place];  Service: Open Heart Surgery;  Laterality: N/A;  . POSTERIOR LUMBAR FUSION  1996  . TEE WITHOUT CARDIOVERSION  10/17/2011    Procedure: TRANSESOPHAGEAL ECHOCARDIOGRAM (TEE);  Surgeon: Jolaine Artist, MD;  Location: [facility] ENDOSCOPY;  Service: Cardiovascular;  Laterality: N/A;    Current Medications: Outpatient Medications Prior to Visit  Medication Sig Dispense Refill  . aspirin 81 MG tablet Take 81 mg by mouth daily.    Marland Kitchen atorvastatin (LIPITOR) 80 MG tablet TAKE ONE (1) TABLET EACH DAY (Patient taking differently: Take 80 mg by mouth every evening. ) 90 tablet 3  . carvedilol (COREG) 12.5 MG tablet Take 1-2 tablets (12.5-25 mg total) by mouth See admin instructions. Take 12.5 mg am and 25 mg ( 2 tablets) in the pm 270 tablet 3  . celecoxib (CELEBREX) 200 MG capsule Take 200 mg by mouth daily.     Marland Kitchen EPIPEN 2-PAK 0.3 MG/0.3ML SOAJ injection Inject 0.3 mg as directed daily as needed (for anaphylactic reactions.).     Marland Kitchen escitalopram (LEXAPRO) 20 MG tablet Take 20 mg by mouth daily.    . furosemide (LASIX) 20 MG tablet Take 10 mg by mouth daily as needed. For swelling/fluid retention.    Marland Kitchen HYDROcodone-acetaminophen (NORCO) 10-325 MG per tablet Take 1 tablet by mouth every 8 (eight) hours as needed. Pain (  Patient taking differently: Take 1 tablet by mouth every 4 (four) hours as needed for moderate pain. ) 21 tablet 0  . isosorbide mononitrate (IMDUR) 30 MG 24 hr tablet Take 0.5 tablets (15 mg total) by mouth daily. 45 tablet 3  . levocetirizine (XYZAL) 5 MG tablet Take 5 mg by mouth at bedtime as needed for allergies.    . pantoprazole (PROTONIX) 40 MG tablet Take 40 mg by mouth daily before breakfast.     . spironolactone (ALDACTONE) 25 MG tablet Take 12.5 mg by mouth daily as needed. For swelling/fluid retention.     No facility-administered medications prior to visit.      Allergies:   Metoprolol; Peach flavor; Morphine and related; Bee venom; and Lisinopril   Social History   Socioeconomic History  . Marital status: Married    Spouse name: Not on file  . Number of children: Not on file  . Years of education:  Not on file  . Highest education level: Not on file  Occupational History  . Not on file  Social Needs  . Financial resource strain: Not on file  . Food insecurity:    Worry: Not on file    Inability: Not on file  . Transportation needs:    Medical: Not on file    Non-medical: Not on file  Tobacco Use  . Smoking status: Current Every Day Smoker    Packs/day: 0.75    Years: 46.00    Pack years: 34.50    Types: Cigarettes    Start date: 12/12/1969  . Smokeless tobacco: Never Used  . Tobacco comment: currently smoking 2 cigarettes per day  Substance and Sexual Activity  . 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 Drug use: No  . Sexual activity: Never    Comment: Quit drinking alcohol 2 yrs ago  Lifestyle  . Physical activity:    Days per week: Not on file    Minutes per session: Not on file  . Stress: Not on file  Relationships  . Social connections:    Talks on phone: Not on file    Gets together: Not on file    Attends religious service: Not on file    Active member of club or organization: Not on file    Attends meetings of clubs or organizations: Not on file    Relationship status: Not on file  Other Topics Concern  . 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  Lives with wife.     Family History:  The patient's family history includes Arrhythmia in his brother; Coronary artery disease in his father and mother.   Review of Systems:   Please see the history of present illness.     General:  No chills, fever, or night sweats. Positive for weight gain.  Cardiovascular:  No chest pain, dyspnea on exertion, edema, orthopnea, palpitations, paroxysmal nocturnal dyspnea. Dermatological: No rash, lesions/masses Respiratory: No cough, dyspnea Urologic: No hematuria, dysuria Abdominal:   No nausea, vomiting, diarrhea, bright red blood per rectum,  melena, or hematemesis Neurologic:  No visual changes, wkns, changes in mental status. All other systems reviewed and are otherwise negative except as noted above.   Physical Exam:    VS:  BP 120/62 (BP Location: Left Arm)   Pulse 83   Ht 5\' 11"  (1.803 m)  Wt 211 lb (95.7 kg)   SpO2 92%   BMI 29.43 kg/m    General: Well developed, well nourished Caucasian male appearing in no acute distress. Head: Normocephalic, atraumatic, sclera non-icteric, no xanthomas, nares are without discharge.  Neck: No carotid bruits. JVD not elevated.  Lungs: Respirations regular and unlabored, without wheezes or rales.  Heart: Regular rate and rhythm. No S3 or S4.  No murmur, no rubs, or gallops appreciated. Abdomen: Soft, non-tender, non-distended with normoactive bowel sounds. No hepatomegaly. No rebound/guarding. No obvious abdominal masses. Msk:  Strength and tone appear normal for age. No joint deformities or effusions. Extremities: No clubbing or cyanosis. No lower extremity edema.  Distal pedal pulses are 2+ bilaterally. Neuro: Alert and oriented X 3. Moves all extremities spontaneously. No focal deficits noted. Psych:  Responds to questions appropriately with a normal affect. Skin: No rashes or lesions noted  Wt Readings from Last 3 Encounters:  06/05/18 211 lb (95.7 kg)  04/23/18 206 lb 9.6 oz (93.7 kg)  03/26/18 209 lb 9.6 oz (95.1 kg)    Studies/Labs Reviewed:   EKG:  EKG is not ordered today.  Recent Labs: 11/05/2017: BUN 13; Creatinine, Ser 1.24; Hemoglobin 13.8; Platelets 342; Potassium 4.4; Sodium 135   Lipid Panel    Component Value Date/Time   CHOL 138 02/17/2013 0815   TRIG 229 (H) 02/17/2013 0815   HDL 28 (L) 02/17/2013 0815   CHOLHDL 4.9 02/17/2013 0815   VLDL 46 (H) 02/17/2013 0815   LDLCALC 64 02/17/2013 0815    Additional studies/ records that were reviewed today include:   Echocardiogram: 09/2015 Study Conclusions  - Left ventricle: The cavity size was mildly  dilated. Wall   thickness was normal. Systolic function was normal. The estimated   ejection fraction was in the range of 55% to 60%. Wall motion was   normal; there were no regional wall motion abnormalities.   Features are consistent with a pseudonormal left ventricular   filling pattern, with concomitant abnormal relaxation and   increased filling pressure (grade 2 diastolic dysfunction). - Aortic valve: There was mild regurgitation. - Mitral valve: Mildly thickened leaflets . There was trivial   regurgitation. Valve area by pressure half-time: 2.27 cm^2. - Left atrium: The atrium was mildly dilated. - Right ventricle: Pacer wire or catheter noted in right ventricle. - Right atrium: The atrium was mildly dilated. - Atrial septum: No defect or patent foramen ovale was identified. - Tricuspid valve: There was trivial regurgitation. - Pulmonary arteries: PA peak pressure: 34 mm Hg (S). - Pericardium, extracardiac: There was no pericardial effusion.  Impressions:  - Mildly dilated LV chamber size with LVEF 55-60%. Grade 2   diastolic dysfunction with increased LV filling pressure. Mild   left atrial enlargement. Status post mitral annuloplasty with   mildly thickened leaflets and trivial mitral regurgitation. Mild   aortic regurgitation. Device wire noted within the right heart.   Trivial tricuspid regurgitation with PASP 34 mmHg.  NST: 04/30/2018  No diagnostic ST segment changes to indicate ischemia.  Small, mild intensity, apical to basal inferior defect that is partially reversible, particularly in the mid to apical zones. This is consistent with scar and mild peri-infarct ischemia.  Small, mild intensity, apical anterior defect that is partially reversible and consistent with scar with mild peri-infarct ischemia.  This is an intermediate risk study.  Nuclear stress EF: 48%.   Assessment:    1. Coronary artery disease involving native coronary artery of native heart  without angina  pectoris   2. Cardiomyopathy, ischemic   3. Mitral valve disease   4. Essential hypertension   5. Mixed hyperlipidemia      Plan:   In order of problems listed above:  1. CAD - he is s/p CABG in 09/2011 with LIMA-LAD, Seq SVG-OM1-OM2-OM3, SVG-PDA, and SVG-RCA.  Recent NST showed findings consistent with scar and mild peri-infarct ischemia. Had reviewed with his Primary Cardiologist and the plan was for continued medication management unless he developed refractory symptoms. He denies any recurrent dyspnea since being started on Imdur along with as needed inhalers by his PCP. No recent chest pain. Reviewed stress test results with the patient in detail today and will plan to continue medical management at this time given that his symptoms have significantly improved. Was informed to make [place] aware if his symptoms represent. - Continue ASA, statin, beta-blocker, and Imdur.  2. History of Ischemic Cardiomyopathy - EF previously 15-20% in 2013 and he is s/p Medtronic ICD placement which is followed by Dr. Lovena Le.  Most recent echocardiogram in 2017 showed his EF had improved to 55 to 60%. He denies any recurrent dyspnea on exertion. No recent orthopnea, PND, or lower extremity edema. Appears euvolemic by examination today.   - Continue Coreg and Spironolactone at current dosing. Was previously on Lisinopril and this was discontinued due to coughing and orthostasis. BP is overall stable at the time of today's visit. If additional BP control is needed in the future, would consider low-dose ARB. Continue with PRN Lasix dosing.    3. Mitral Regurgitation - s/p repair with annuloplasty ring in 09/2011. Echocardiogram in 2017 showed trivial regurgitation as outlined above. Will continue to follow.  4. HTN - BP is well controlled at 120/62 during today's visit. Continue current medication regimen.    5.  Hyperlipidemia  - Followed by PCP. FLP in 03/2018 showed total cholesterol 89,  triglycerides 147, HDL 24, and LDL 36.   At goal of LDL less than 70. Continue Atorvastatin 80 mg daily.   Medication Adjustments/Labs and Tests Ordered: Current medicines are reviewed at length with the patient today.  Concerns regarding medicines are outlined above.  Medication changes, Labs and Tests ordered today are listed in the Patient Instructions below. Patient Instructions  Medication Instructions:  Your physician recommends that you continue on your current medications as directed. Please refer to the Current Medication list given to you today.  If you need a refill on your cardiac medications before your next appointment, please call your pharmacy.   Lab work: NONE  If you have labs (blood work) drawn today and your tests are completely normal, you will receive your results only by: Marland Kitchen MyChart Message (if you have MyChart) OR . A paper copy in the mail If you have any lab test that is abnormal or we need to change your treatment, we will call you to review the results.  Testing/Procedures: NONE   Follow-Up: At [facility], you and your health needs are our priority.  As part of our continuing mission to provide you with exceptional heart care, we have created designated Provider Care Teams.  These Care Teams include your primary Cardiologist (physician) and Advanced Practice Providers (APPs -  Physician Assistants and Nurse Practitioners) who all work together to provide you with the care you need, when you need it. You will need a follow up appointment in 6 months.  Please call our office 2 months in advance to schedule this appointment.  You may see Branch,  Roderic Palau, MD or one of the following Advanced Practice Providers on your designated Care Team:   Bernerd Pho, PA-C [facility]) . Ermalinda Barrios, PA-C ([place])  Any Other Special Instructions Will Be Listed Below (If Applicable). Thank you for choosing [place]!    Signed, Erma Heritage, PA-C  06/05/2018 4:51 PM    [facility] [address] [place], [place] [address] Phone: [phone]

## 2018-06-04 NOTE — Telephone Encounter (Signed)
CALLED PATIENT TO INFORM OF LAB AND CT FOR 06-05-18 - ARRIVAL TIME - 2:45 PM @ [facility], PT. TO BE NPO- 4 HRS. PRIOR TO TEST, LVM FOR A RETURN CALL

## 2018-06-05 ENCOUNTER — Encounter: Payer: Self-pay | Admitting: Student

## 2018-06-05 ENCOUNTER — Ambulatory Visit (INDEPENDENT_AMBULATORY_CARE_PROVIDER_SITE_OTHER): Payer: Medicare HMO | Admitting: Student

## 2018-06-05 ENCOUNTER — Ambulatory Visit (HOSPITAL_COMMUNITY): Payer: Medicare HMO

## 2018-06-05 VITALS — BP 120/62 | HR 83 | Ht 71.0 in | Wt 211.0 lb

## 2018-06-05 DIAGNOSIS — I059 Rheumatic mitral valve disease, unspecified: Secondary | ICD-10-CM | POA: Diagnosis not present

## 2018-06-05 DIAGNOSIS — I255 Ischemic cardiomyopathy: Secondary | ICD-10-CM | POA: Diagnosis not present

## 2018-06-05 DIAGNOSIS — I2510 Atherosclerotic heart disease of native coronary artery without angina pectoris: Secondary | ICD-10-CM | POA: Diagnosis not present

## 2018-06-05 DIAGNOSIS — I10 Essential (primary) hypertension: Secondary | ICD-10-CM

## 2018-06-05 DIAGNOSIS — E782 Mixed hyperlipidemia: Secondary | ICD-10-CM | POA: Diagnosis not present

## 2018-06-05 NOTE — Patient Instructions (Signed)
Medication Instructions:  Your physician recommends that you continue on your current medications as directed. Please refer to the Current Medication list given to you today.  If you need a refill on your cardiac medications before your next appointment, please call your pharmacy.   Lab work: NONE   If you have labs (blood work) drawn today and your tests are completely normal, you will receive your results only by: . MyChart Message (if you have MyChart) OR . A paper copy in the mail If you have any lab test that is abnormal or we need to change your treatment, we will call you to review the results.  Testing/Procedures: NONE   Follow-Up: At [facility], you and your health needs are our priority.  As part of our continuing mission to provide you with exceptional heart care, we have created designated Provider Care Teams.  These Care Teams include your primary Cardiologist (physician) and Advanced Practice Providers (APPs -  Physician Assistants and Nurse Practitioners) who all work together to provide you with the care you need, when you need it. You will need a follow up appointment in 6 months.  Please call our office 2 months in advance to schedule this appointment.  You may see Branch, Jonathan, MD or one of the following Advanced Practice Providers on your designated Care Team:   Brittany Strader, PA-C ([place] Office) . Michele Lenze, PA-C ( Office)  Any Other Special Instructions Will Be Listed Below (If Applicable). Thank you for choosing [facility]!     

## 2018-06-06 ENCOUNTER — Other Ambulatory Visit: Payer: Self-pay

## 2018-06-06 ENCOUNTER — Ambulatory Visit
Admission: RE | Admit: 2018-06-06 | Discharge: 2018-06-06 | Disposition: A | Discharge status: Home / Self Care | Payer: Medicare HMO | Source: Ambulatory Visit | Attending: Urology | Admitting: Urology

## 2018-06-06 ENCOUNTER — Encounter: Payer: Self-pay | Admitting: Urology

## 2018-06-06 VITALS — BP 123/93 | HR 66 | Temp 98.2°F | Resp 20 | Ht 71.0 in | Wt 209.8 lb

## 2018-06-06 DIAGNOSIS — C3431 Malignant neoplasm of lower lobe, right bronchus or lung: Secondary | ICD-10-CM

## 2018-06-06 DIAGNOSIS — Z79899 Other long term (current) drug therapy: Secondary | ICD-10-CM | POA: Diagnosis not present

## 2018-06-06 DIAGNOSIS — F17210 Nicotine dependence, cigarettes, uncomplicated: Secondary | ICD-10-CM | POA: Insufficient documentation

## 2018-06-06 DIAGNOSIS — Z7982 Long term (current) use of aspirin: Secondary | ICD-10-CM | POA: Insufficient documentation

## 2018-06-06 DIAGNOSIS — Z85118 Personal history of other malignant neoplasm of bronchus and lung: Secondary | ICD-10-CM | POA: Diagnosis not present

## 2018-06-06 DIAGNOSIS — Z08 Encounter for follow-up examination after completed treatment for malignant neoplasm: Secondary | ICD-10-CM | POA: Diagnosis not present

## 2018-06-06 NOTE — Progress Notes (Signed)
Radiation Oncology         [phone] ________________________________  Name: Charles Savage MRN: [id number]  Date: 06/06/2018  DOB: [birth year]  Post Treatment Note  CC: Sharilyn Sites, MD  Sharilyn Sites, MD  Diagnosis:   62 y.o. 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  Interval Since Last Radiation: 1 year, 6 months; Curative, Definitive SBRT  12/13/16 - 12/22/16:  The right lower lung was treated to 50 Gy in 5 fractions of 10 Gy  Narrative:  The patient returns today for routine follow-up and to review results from his recent follow up CT Chest obtained on 05/29/18 which shows radiation treatment changes only, without evidence of disease recurrence or metastasis.                On review of systems, the patient states that he is doing well in general. He specifically denies hemoptysis, increased cough or shortness of breath, fevers, chills or night sweats. He was recently hospitalized for COPD exacerbation but this has since been resolved.  He reports a healthy appetite and is maintaining his weight. He denies abdominal pain, nausea, vomiting or diarrhea. He reports that his energy level is fair.  He has not had any further hypoglycemic episodes where he breaks out into a sweat and feels lightheaded. He has continued eating breakfast and making sure that he does not skip meals or go long periods of time between eating which he thinks has helped resolve this issue.  He had a repeat EGD on 12/24/17 wth Dr. Gala Romney for treatment of his partially obstructing [place] ring contributing to his chronic dysphagia and is currently without complaints in regards to dysphagia.    ALLERGIES:  is allergic to metoprolol; peach flavor; morphine and related; bee venom; and lisinopril.  Meds: Current Outpatient Medications  Medication Sig Dispense Refill  . aspirin 81 MG tablet Take 81 mg by mouth daily.    Marland Kitchen atorvastatin (LIPITOR) 80 MG tablet TAKE ONE (1)  TABLET EACH DAY (Patient taking differently: Take 80 mg by mouth every evening. ) 90 tablet 3  . carvedilol (COREG) 12.5 MG tablet Take 1-2 tablets (12.5-25 mg total) by mouth See admin instructions. Take 12.5 mg am and 25 mg ( 2 tablets) in the pm 270 tablet 3  . celecoxib (CELEBREX) 200 MG capsule Take 200 mg by mouth daily.     Marland Kitchen EPIPEN 2-PAK 0.3 MG/0.3ML SOAJ injection Inject 0.3 mg as directed daily as needed (for anaphylactic reactions.).     Marland Kitchen escitalopram (LEXAPRO) 20 MG tablet Take 20 mg by mouth daily.    . furosemide (LASIX) 20 MG tablet Take 10 mg by mouth daily as needed. For swelling/fluid retention.    Marland Kitchen HYDROcodone-acetaminophen (NORCO) 10-325 MG per tablet Take 1 tablet by mouth every 8 (eight) hours as needed. Pain (Patient taking differently: Take 1 tablet by mouth every 4 (four) hours as needed for moderate pain. ) 21 tablet 0  . isosorbide mononitrate (IMDUR) 30 MG 24 hr tablet Take 0.5 tablets (15 mg total) by mouth daily. 45 tablet 3  . levocetirizine (XYZAL) 5 MG tablet Take 5 mg by mouth at bedtime as needed for allergies.    . pantoprazole (PROTONIX) 40 MG tablet Take 40 mg by mouth daily before breakfast.     . spironolactone (ALDACTONE) 25 MG tablet Take 12.5 mg by mouth daily as needed. For swelling/fluid retention.     No current facility-administered medications for this encounter.  Physical Findings:  height is 5\' 11"  (1.803 m) and weight is 209 lb 12.8 oz (95.2 kg). His oral temperature is 98.2 F (36.8 C). His blood pressure is 123/93 (abnormal) and his pulse is 66. His respiration is 20 and oxygen saturation is 93%.  Pain Assessment Pain Score: 4  Pain Loc: Back/10 In general this is a well appearing Caucasian male in no acute distress. He's alert and oriented x4 and appropriate throughout the examination. Cardiopulmonary assessment is negative for acute distress and he exhibits normal effort. Lungs are clear to ausculation bilaterally. Cardiac exam with  RRR, no murmurs, rubs or gallops.  Lab Findings: Lab Results  Component Value Date   WBC 10.3 11/05/2017   HGB 13.8 11/05/2017   HCT 41.2 11/05/2017   MCV 94.1 11/05/2017   PLT 342 11/05/2017     Radiographic Findings: Dg Chest 2 View  Result Date: 05/10/2018 CLINICAL DATA:  Shortness of breath. EXAM: CHEST - 2 VIEW COMPARISON:  Chest CT 11/28/2017 FINDINGS: Single lead AICD device overlies the left hemithorax. Stable cardiac and mediastinal contours status post median sternotomy. Persistent right effusion and underlying pulmonary consolidation. Similar-appearing coarse interstitial opacities within the left lower lobe. Additionally there is a focal nodular area of consolidation within the left lower hemithorax. No pneumothorax. IMPRESSION: Focal nodular area within the left lower hemithorax measuring up to 2.5 cm, nonspecific. While this may be infectious/inflammatory in etiology, disease progression not excluded. Recommend further evaluation with chest CT. Similar-appearing right hemithorax with effusion and right lower lung consolidation. These results will be called to the ordering clinician or representative by the Radiologist Assistant, and communication documented in the PACS or zVision Dashboard. Electronically Signed   By: Lovey Newcomer M.D.   On: 05/10/2018 10:15    Impression/Plan: 1. 62 y.o. 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 Recent CT Chest imaging reveals no evidence of recurrent or metastatic disease. There are evolutionary post-treatment changes in the right lower lobe but stable in appearance.  We will continue with serial  CT imaging of the chest with contrast at six-month intervals until 5 years when, at that point in time, he will have an annual low dose CT scan for surveillance. He will return in 6 months to review the findings from the scan to be ordered prior to that visit.  He prefers to have his labs (BMP) and  imaging at [facility] since this is much closer to his home.   2. Tobacco abuse: The patient continues to smoke less than half a pack per day. The patient is counseled on the importance of smoking cessation but is still not ready to commit to quitting.  We will continue to discuss this further at subsequent visits.    Nicholos Johns, PA-C

## 2018-06-07 ENCOUNTER — Other Ambulatory Visit: Payer: Self-pay | Admitting: Urology

## 2018-06-07 ENCOUNTER — Ambulatory Visit
Admission: RE | Admit: 2018-06-07 | Discharge: 2018-06-07 | Disposition: A | Discharge status: Home / Self Care | Payer: Self-pay | Source: Ambulatory Visit | Attending: Urology | Admitting: Urology

## 2018-06-07 DIAGNOSIS — C3490 Malignant neoplasm of unspecified part of unspecified bronchus or lung: Secondary | ICD-10-CM

## 2018-06-10 ENCOUNTER — Ambulatory Visit (INDEPENDENT_AMBULATORY_CARE_PROVIDER_SITE_OTHER): Payer: Medicare HMO

## 2018-06-10 DIAGNOSIS — I5022 Chronic systolic (congestive) heart failure: Secondary | ICD-10-CM

## 2018-06-10 DIAGNOSIS — I255 Ischemic cardiomyopathy: Secondary | ICD-10-CM

## 2018-06-10 NOTE — Progress Notes (Signed)
Remote ICD transmission.   

## 2018-06-11 ENCOUNTER — Encounter: Payer: Self-pay | Admitting: Cardiology

## 2018-06-15 DIAGNOSIS — R0602 Shortness of breath: Secondary | ICD-10-CM | POA: Diagnosis not present

## 2018-06-15 DIAGNOSIS — J9690 Respiratory failure, unspecified, unspecified whether with hypoxia or hypercapnia: Secondary | ICD-10-CM | POA: Diagnosis not present

## 2018-06-15 DIAGNOSIS — G4733 Obstructive sleep apnea (adult) (pediatric): Secondary | ICD-10-CM | POA: Diagnosis not present

## 2018-06-15 DIAGNOSIS — J1520 Pneumonia due to staphylococcus, unspecified: Secondary | ICD-10-CM | POA: Diagnosis not present

## 2018-06-15 DIAGNOSIS — I509 Heart failure, unspecified: Secondary | ICD-10-CM | POA: Diagnosis not present

## 2018-06-21 ENCOUNTER — Emergency Department (HOSPITAL_COMMUNITY): Payer: Medicare HMO

## 2018-06-21 ENCOUNTER — Encounter (HOSPITAL_COMMUNITY): Payer: Self-pay

## 2018-06-21 ENCOUNTER — Other Ambulatory Visit: Payer: Self-pay

## 2018-06-21 ENCOUNTER — Inpatient Hospital Stay (HOSPITAL_COMMUNITY)
Admission: EM | Admit: 2018-06-21 | Discharge: 2018-06-24 | DRG: 291 | Disposition: A | Discharge status: Home / Self Care | Payer: Medicare HMO | Attending: Family Medicine | Admitting: Family Medicine

## 2018-06-21 DIAGNOSIS — Z8249 Family history of ischemic heart disease and other diseases of the circulatory system: Secondary | ICD-10-CM

## 2018-06-21 DIAGNOSIS — G4733 Obstructive sleep apnea (adult) (pediatric): Secondary | ICD-10-CM | POA: Diagnosis present

## 2018-06-21 DIAGNOSIS — I340 Nonrheumatic mitral (valve) insufficiency: Secondary | ICD-10-CM | POA: Diagnosis present

## 2018-06-21 DIAGNOSIS — I5022 Chronic systolic (congestive) heart failure: Secondary | ICD-10-CM | POA: Diagnosis not present

## 2018-06-21 DIAGNOSIS — I509 Heart failure, unspecified: Secondary | ICD-10-CM

## 2018-06-21 DIAGNOSIS — E785 Hyperlipidemia, unspecified: Secondary | ICD-10-CM | POA: Diagnosis present

## 2018-06-21 DIAGNOSIS — K219 Gastro-esophageal reflux disease without esophagitis: Secondary | ICD-10-CM | POA: Diagnosis present

## 2018-06-21 DIAGNOSIS — Z79899 Other long term (current) drug therapy: Secondary | ICD-10-CM

## 2018-06-21 DIAGNOSIS — R197 Diarrhea, unspecified: Secondary | ICD-10-CM | POA: Diagnosis present

## 2018-06-21 DIAGNOSIS — M479 Spondylosis, unspecified: Secondary | ICD-10-CM | POA: Diagnosis present

## 2018-06-21 DIAGNOSIS — I679 Cerebrovascular disease, unspecified: Secondary | ICD-10-CM | POA: Diagnosis present

## 2018-06-21 DIAGNOSIS — Z85118 Personal history of other malignant neoplasm of bronchus and lung: Secondary | ICD-10-CM

## 2018-06-21 DIAGNOSIS — Z9181 History of falling: Secondary | ICD-10-CM | POA: Diagnosis not present

## 2018-06-21 DIAGNOSIS — G8929 Other chronic pain: Secondary | ICD-10-CM | POA: Diagnosis present

## 2018-06-21 DIAGNOSIS — Z95810 Presence of automatic (implantable) cardiac defibrillator: Secondary | ICD-10-CM | POA: Diagnosis not present

## 2018-06-21 DIAGNOSIS — Z9981 Dependence on supplemental oxygen: Secondary | ICD-10-CM | POA: Diagnosis not present

## 2018-06-21 DIAGNOSIS — J9601 Acute respiratory failure with hypoxia: Secondary | ICD-10-CM | POA: Diagnosis present

## 2018-06-21 DIAGNOSIS — J90 Pleural effusion, not elsewhere classified: Secondary | ICD-10-CM | POA: Diagnosis present

## 2018-06-21 DIAGNOSIS — R0602 Shortness of breath: Secondary | ICD-10-CM | POA: Diagnosis present

## 2018-06-21 DIAGNOSIS — J1520 Pneumonia due to staphylococcus, unspecified: Secondary | ICD-10-CM | POA: Diagnosis not present

## 2018-06-21 DIAGNOSIS — Z8673 Personal history of transient ischemic attack (TIA), and cerebral infarction without residual deficits: Secondary | ICD-10-CM

## 2018-06-21 DIAGNOSIS — I209 Angina pectoris, unspecified: Secondary | ICD-10-CM | POA: Diagnosis present

## 2018-06-21 DIAGNOSIS — I25119 Atherosclerotic heart disease of native coronary artery with unspecified angina pectoris: Secondary | ICD-10-CM | POA: Diagnosis present

## 2018-06-21 DIAGNOSIS — R112 Nausea with vomiting, unspecified: Secondary | ICD-10-CM

## 2018-06-21 DIAGNOSIS — J9690 Respiratory failure, unspecified, unspecified whether with hypoxia or hypercapnia: Secondary | ICD-10-CM | POA: Diagnosis not present

## 2018-06-21 DIAGNOSIS — Z91018 Allergy to other foods: Secondary | ICD-10-CM

## 2018-06-21 DIAGNOSIS — I5043 Acute on chronic combined systolic (congestive) and diastolic (congestive) heart failure: Principal | ICD-10-CM | POA: Diagnosis present

## 2018-06-21 DIAGNOSIS — M170 Bilateral primary osteoarthritis of knee: Secondary | ICD-10-CM | POA: Diagnosis present

## 2018-06-21 DIAGNOSIS — R918 Other nonspecific abnormal finding of lung field: Secondary | ICD-10-CM | POA: Diagnosis not present

## 2018-06-21 DIAGNOSIS — J449 Chronic obstructive pulmonary disease, unspecified: Secondary | ICD-10-CM | POA: Diagnosis present

## 2018-06-21 DIAGNOSIS — I252 Old myocardial infarction: Secondary | ICD-10-CM | POA: Diagnosis not present

## 2018-06-21 DIAGNOSIS — Z91030 Bee allergy status: Secondary | ICD-10-CM

## 2018-06-21 DIAGNOSIS — Z885 Allergy status to narcotic agent status: Secondary | ICD-10-CM

## 2018-06-21 DIAGNOSIS — Z951 Presence of aortocoronary bypass graft: Secondary | ICD-10-CM | POA: Diagnosis not present

## 2018-06-21 DIAGNOSIS — Z7982 Long term (current) use of aspirin: Secondary | ICD-10-CM

## 2018-06-21 DIAGNOSIS — J918 Pleural effusion in other conditions classified elsewhere: Secondary | ICD-10-CM | POA: Diagnosis not present

## 2018-06-21 DIAGNOSIS — F17210 Nicotine dependence, cigarettes, uncomplicated: Secondary | ICD-10-CM | POA: Diagnosis present

## 2018-06-21 DIAGNOSIS — I2510 Atherosclerotic heart disease of native coronary artery without angina pectoris: Secondary | ICD-10-CM | POA: Diagnosis present

## 2018-06-21 DIAGNOSIS — Z8701 Personal history of pneumonia (recurrent): Secondary | ICD-10-CM

## 2018-06-21 DIAGNOSIS — Z888 Allergy status to other drugs, medicaments and biological substances status: Secondary | ICD-10-CM

## 2018-06-21 DIAGNOSIS — F329 Major depressive disorder, single episode, unspecified: Secondary | ICD-10-CM | POA: Diagnosis present

## 2018-06-21 DIAGNOSIS — K6389 Other specified diseases of intestine: Secondary | ICD-10-CM | POA: Diagnosis not present

## 2018-06-21 DIAGNOSIS — I255 Ischemic cardiomyopathy: Secondary | ICD-10-CM | POA: Diagnosis present

## 2018-06-21 DIAGNOSIS — I110 Hypertensive heart disease with heart failure: Secondary | ICD-10-CM | POA: Diagnosis not present

## 2018-06-21 DIAGNOSIS — R5381 Other malaise: Secondary | ICD-10-CM | POA: Diagnosis present

## 2018-06-21 LAB — CBC WITH DIFFERENTIAL/PLATELET
Abs Immature Granulocytes: 0.16 10*3/uL — ABNORMAL HIGH (ref 0.00–0.07)
Basophils Absolute: 0.1 10*3/uL (ref 0.0–0.1)
Basophils Relative: 1 %
EOS PCT: 2 %
Eosinophils Absolute: 0.2 10*3/uL (ref 0.0–0.5)
HCT: 41.3 % (ref 39.0–52.0)
Hemoglobin: 13.6 g/dL (ref 13.0–17.0)
Immature Granulocytes: 2 %
Lymphocytes Relative: 19 %
Lymphs Abs: 2 10*3/uL (ref 0.7–4.0)
MCH: 30.6 pg (ref 26.0–34.0)
MCHC: 32.9 g/dL (ref 30.0–36.0)
MCV: 92.8 fL (ref 80.0–100.0)
MONOS PCT: 7 %
Monocytes Absolute: 0.8 10*3/uL (ref 0.1–1.0)
Neutro Abs: 7.7 10*3/uL (ref 1.7–7.7)
Neutrophils Relative %: 69 %
Platelets: 224 10*3/uL (ref 150–400)
RBC: 4.45 MIL/uL (ref 4.22–5.81)
RDW: 14.6 % (ref 11.5–15.5)
WBC: 10.9 10*3/uL — ABNORMAL HIGH (ref 4.0–10.5)
nRBC: 0 % (ref 0.0–0.2)

## 2018-06-21 LAB — COMPREHENSIVE METABOLIC PANEL
ALK PHOS: 53 U/L (ref 38–126)
ALT: 18 U/L (ref 0–44)
AST: 18 U/L (ref 15–41)
Albumin: 3.6 g/dL (ref 3.5–5.0)
Anion gap: 6 (ref 5–15)
BUN: 19 mg/dL (ref 8–23)
CALCIUM: 8.5 mg/dL — AB (ref 8.9–10.3)
CO2: 26 mmol/L (ref 22–32)
Chloride: 107 mmol/L (ref 98–111)
Creatinine, Ser: 1.15 mg/dL (ref 0.61–1.24)
GFR calc Af Amer: >60 mL/min (ref >60)
GFR calc non Af Amer: >60 mL/min (ref >60)
Glucose, Bld: 188 mg/dL — ABNORMAL HIGH (ref 70–99)
Potassium: 4.3 mmol/L (ref 3.5–5.1)
Sodium: 139 mmol/L (ref 135–145)
TOTAL PROTEIN: 6.9 g/dL (ref 6.5–8.1)
Total Bilirubin: 0.8 mg/dL (ref 0.3–1.2)

## 2018-06-21 LAB — D-DIMER, QUANTITATIVE: D-Dimer, Quant: 1.38 ug/mL-FEU — ABNORMAL HIGH (ref 0.00–0.50)

## 2018-06-21 LAB — BRAIN NATRIURETIC PEPTIDE: B Natriuretic Peptide: 601 pg/mL — ABNORMAL HIGH (ref 0.0–100.0)

## 2018-06-21 LAB — TROPONIN I

## 2018-06-21 MED ORDER — LORATADINE 10 MG PO TABS: 10.0000 mg | ORAL_TABLET | Freq: Every evening | ORAL | Status: DC | PRN

## 2018-06-21 MED ORDER — FUROSEMIDE 10 MG/ML IJ SOLN
40.0000 mg | Freq: Once | INTRAMUSCULAR | Status: AC
  Administered 2018-06-21: 40 mg via INTRAVENOUS
  Filled 2018-06-21: qty 4

## 2018-06-21 MED ORDER — FUROSEMIDE 10 MG/ML IJ SOLN: 20.0000 mg | Freq: Two times a day (BID) | INTRAMUSCULAR | Status: DC

## 2018-06-21 MED ORDER — ATORVASTATIN CALCIUM 40 MG PO TABS
80.0000 mg | ORAL_TABLET | Freq: Every evening | ORAL | Status: DC
  Administered 2018-06-21 – 2018-06-23 (×3): 80 mg via ORAL
  Filled 2018-06-21 (×3): qty 2

## 2018-06-21 MED ORDER — ESCITALOPRAM OXALATE 10 MG PO TABS
20.0000 mg | ORAL_TABLET | Freq: Every day | ORAL | Status: DC
  Administered 2018-06-22 – 2018-06-24 (×3): 20 mg via ORAL
  Filled 2018-06-21 (×4): qty 2

## 2018-06-21 MED ORDER — HYDROCODONE-ACETAMINOPHEN 10-325 MG PO TABS
1.0000 | ORAL_TABLET | Freq: Three times a day (TID) | ORAL | Status: DC | PRN
  Administered 2018-06-22 – 2018-06-23 (×4): 1 via ORAL
  Filled 2018-06-21 (×4): qty 1

## 2018-06-21 MED ORDER — CARVEDILOL 12.5 MG PO TABS
12.5000 mg | ORAL_TABLET | Freq: Every morning | ORAL | Status: DC
  Administered 2018-06-22 – 2018-06-24 (×3): 12.5 mg via ORAL
  Filled 2018-06-21 (×3): qty 1

## 2018-06-21 MED ORDER — ACETAMINOPHEN 650 MG RE SUPP: 650.0000 mg | Freq: Four times a day (QID) | RECTAL | Status: DC | PRN

## 2018-06-21 MED ORDER — CARVEDILOL 12.5 MG PO TABS
25.0000 mg | ORAL_TABLET | Freq: Every day | ORAL | Status: DC
  Administered 2018-06-21 – 2018-06-23 (×3): 25 mg via ORAL
  Filled 2018-06-21 (×3): qty 2

## 2018-06-21 MED ORDER — CELECOXIB 100 MG PO CAPS
200.0000 mg | ORAL_CAPSULE | Freq: Every day | ORAL | Status: DC
  Administered 2018-06-22 – 2018-06-24 (×3): 200 mg via ORAL
  Filled 2018-06-21: qty 2
  Filled 2018-06-21 (×3): qty 1
  Filled 2018-06-21 (×2): qty 2
  Filled 2018-06-21: qty 1

## 2018-06-21 MED ORDER — ASPIRIN EC 81 MG PO TBEC
81.0000 mg | DELAYED_RELEASE_TABLET | Freq: Every day | ORAL | Status: DC
  Administered 2018-06-22 – 2018-06-24 (×3): 81 mg via ORAL
  Filled 2018-06-21 (×4): qty 1

## 2018-06-21 MED ORDER — ISOSORBIDE MONONITRATE ER 30 MG PO TB24
15.0000 mg | ORAL_TABLET | Freq: Every day | ORAL | Status: DC
  Administered 2018-06-22 – 2018-06-24 (×3): 15 mg via ORAL
  Filled 2018-06-21 (×8): qty 1

## 2018-06-21 MED ORDER — FUROSEMIDE 10 MG/ML IJ SOLN
40.0000 mg | Freq: Once | INTRAMUSCULAR | Status: AC
  Administered 2018-06-22: 40 mg via INTRAVENOUS
  Filled 2018-06-21: qty 4

## 2018-06-21 MED ORDER — HEPARIN SODIUM (PORCINE) 5000 UNIT/ML IJ SOLN
5000.0000 [IU] | Freq: Three times a day (TID) | INTRAMUSCULAR | Status: DC
  Administered 2018-06-21 – 2018-06-24 (×8): 5000 [IU] via SUBCUTANEOUS
  Filled 2018-06-21 (×9): qty 1

## 2018-06-21 MED ORDER — IOPAMIDOL (ISOVUE-370) INJECTION 76%
100.0000 mL | Freq: Once | INTRAVENOUS | Status: AC | PRN
  Administered 2018-06-21: 100 mL via INTRAVENOUS

## 2018-06-21 MED ORDER — PANTOPRAZOLE SODIUM 40 MG PO TBEC
40.0000 mg | DELAYED_RELEASE_TABLET | Freq: Every day | ORAL | Status: DC
  Administered 2018-06-22 – 2018-06-24 (×3): 40 mg via ORAL
  Filled 2018-06-21 (×3): qty 1

## 2018-06-21 MED ORDER — ACETAMINOPHEN 325 MG PO TABS: 650.0000 mg | ORAL_TABLET | Freq: Four times a day (QID) | ORAL | Status: DC | PRN

## 2018-06-21 NOTE — ED Triage Notes (Signed)
Family reports pt with SOB since thanksgiving and increase SOB on christmas day. Pt reports weakness and falling on Wednesday. Wife states pt turns gray in color at times. Sats 82 % in triage

## 2018-06-21 NOTE — ED Notes (Signed)
Gave patient 8 oz ice water as requested and approved by Dr Eulis Foster.

## 2018-06-21 NOTE — ED Notes (Signed)
Emptied two urinals of 1460 ml urine.

## 2018-06-21 NOTE — ED Provider Notes (Signed)
[facility] EMERGENCY DEPARTMENT Provider Note   CSN: [id number] Arrival date & time: 06/21/18  1108     History   Chief Complaint Chief Complaint  Patient presents with  . Shortness of Breath    HPI Charles Savage is a 62 y.o. male.  HPI  He presents for periods of shortness of breath caused by exertion, for greater than 1 month.  During this time he has been seen by cardiology and oncology.  He has had medication modifications by both PCP and cardiology within the last 90 days.  He is taking his medications as prescribed.  He has not used Lasix recently because he has not had any increase in his weight, or leg swelling.  He denies chest pain.  He denies orthopnea.  He denies fever, chills, cough, chest pain, focal weakness or paresthesia.  There are no other known modifying factors.   Past Medical History:  Diagnosis Date  . 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 Arthritis    "back; knees" (06/14/2012)  . Cerebrovascular disease    h/o CVA in 07/2011 + left carotid endarterectomy  . CHF (congestive heart failure) ([place])   . Cholelithiasis 07/05/2012   Asymptomatic; identified on CT scanning of the chest;  . Chronic lower back pain   . COPD (chronic obstructive pulmonary disease) ([place])    on 2L O2 at home since 08/2011  . Coronary artery disease    a. s/p CABG in 09/2011 with LIMA-LAD, Seq SVG-OM1-OM2-OM3, SVG-PDA, and SVG-RCA  . DDD (degenerative disc disease), lumbosacral   . Depression 09/2011   "after OHS" (06/14/2012)  . Hyperlipemia   . ICD (implantable cardiac defibrillator) in place   . Ischemic cardiomyopathy    a. EF previously 15-20% in 2013, s/p Medtronic ICD placement b. repeat echo in 2017 showing EF had improved to 55-60%)  . Lung cancer ([place]) 2018   Radiation treatment  . Myocardial infarction ([place]) 08/2011  . Other primary cardiomyopathies   . Pneumonia   . Sleep apnea   . Stroke [facility]) 07/2011     denies residual (06/14/2012)    Patient Active Problem List   Diagnosis Date Noted  . Acute exacerbation of CHF (congestive heart failure) ([place]) 06/21/2018  . Esophageal stricture 03/26/2018  . Esophageal dysphagia 10/01/2017  . GERD (gastroesophageal reflux disease) 10/01/2017  . Ventral hernia without obstruction or gangrene 10/01/2017  . Primary adenocarcinoma of lower lobe of right lung ([place]) 11/10/2016  . History of colonic polyps   . Neutrophilic leukocytosis 40/98/1191  . Hypoglycemia, suspected by symptomatology 08/01/2013  . Chronic systolic heart failure ([place]) 06/02/2013  . Cholelithiasis 07/05/2012  . ICD (implantable cardioverter-defibrillator) in place 06/15/2012  . History of diagnostic tests 12/11/2011  . COPD (chronic obstructive pulmonary disease) 09/29/2011  . Arteriosclerotic cardiovascular disease (ASCVD) 09/19/2011  . Tobacco abuse 09/11/2011  . Hyperlipemia   . Lumbar disc disease   . Cerebrovascular disease     Past Surgical History:  Procedure Laterality Date  . BACK SURGERY    . CARDIAC CATHETERIZATION  09/2011  . CARDIAC DEFIBRILLATOR PLACEMENT  06/14/2012  . CAROTID ENDARTERECTOMY  07/2011   "left" (06/14/2012)  . COLONOSCOPY N/A 10/16/2014   Dr. Gala Romney: rectal polyps removed (hyperplastic). next TCS 10 years.   . CORONARY ARTERY BYPASS GRAFT  10/20/2011   Procedure: CORONARY ARTERY BYPASS GRAFTING (CABG);  Surgeon: [name], MD;  Location: [place];  Service: Open Heart Surgery;  Laterality:  N/A;  CABG x six;  using left internal mammary artery and right leg greater saphenous vein harvested endoscopically  . ESOPHAGOGASTRODUODENOSCOPY (EGD) WITH PROPOFOL N/A 11/12/2017   Procedure: ESOPHAGOGASTRODUODENOSCOPY (EGD) WITH PROPOFOL;  Surgeon: Daneil Dolin, MD;  [facility];  Service: Endoscopy;  Laterality: N/A;  7:30am  . ESOPHAGOGASTRODUODENOSCOPY (EGD) WITH PROPOFOL N/A 12/24/2017   Procedure: ESOPHAGOGASTRODUODENOSCOPY (EGD) WITH  PROPOFOL;  Surgeon: Daneil Dolin, MD;  [facility];  Service: Endoscopy;  Laterality: N/A;  1:00pm  . FOREIGN BODY REMOVAL  06/05/2011   Procedure: FOREIGN BODY REMOVAL ADULT;  Surgeon: Hermelinda Dellen;  Location: [place];  Service: Plastics;  Laterality: Right;  removal foreign body from right side face   . IMPLANTABLE CARDIOVERTER DEFIBRILLATOR IMPLANT N/A 06/14/2012   Procedure: IMPLANTABLE CARDIOVERTER DEFIBRILLATOR IMPLANT;  Surgeon: Evans Lance, MD;  Location: [facility] CATH LAB;  Service: Cardiovascular;  Laterality: N/A;  . KNEE ARTHROSCOPY  2002   "right" (06/14/2012)  . LEFT AND RIGHT HEART CATHETERIZATION WITH CORONARY ANGIOGRAM N/A 10/16/2011   Procedure: LEFT AND RIGHT HEART CATHETERIZATION WITH CORONARY ANGIOGRAM;  Surgeon: Jolaine Artist, MD;  Location: [facility] CATH LAB;  Service: Cardiovascular;  Laterality: N/A;  . LUMBAR [place] SURGERY  2012  . MALONEY DILATION N/A 12/24/2017   Procedure: Venia Minks DILATION;  Surgeon: Daneil Dolin, MD;  [facility];  Service: Endoscopy;  Laterality: N/A;  . MITRAL VALVE REPAIR  10/20/2011   Procedure: MITRAL VALVE REPAIR (MVR);  Surgeon: [name], MD;  Location: [place];  Service: Open Heart Surgery;  Laterality: N/A;  . POSTERIOR LUMBAR FUSION  1996  . TEE WITHOUT CARDIOVERSION  10/17/2011   Procedure: TRANSESOPHAGEAL ECHOCARDIOGRAM (TEE);  Surgeon: Jolaine Artist, MD;  Location: [facility] ENDOSCOPY;  Service: Cardiovascular;  Laterality: N/A;        Home Medications    Prior to Admission medications   Medication Sig Start Date End Date Taking? Authorizing Provider  aspirin 81 MG tablet Take 81 mg by mouth daily.   Yes [provider]  atorvastatin (LIPITOR) 80 MG tablet TAKE ONE (1) TABLET EACH DAY Patient taking differently: Take 80 mg by mouth every evening.  07/10/17  Yes BranchAlphonse Guild, MD  carvedilol (COREG) 12.5 MG tablet Take 1-2 tablets (12.5-25 mg total) by mouth See admin  instructions. Take 12.5 mg am and 25 mg ( 2 tablets) in the pm 10/31/17  Yes Branch, Alphonse Guild, MD  celecoxib (CELEBREX) 200 MG capsule Take 200 mg by mouth daily.  01/04/17  Yes [provider]  EPIPEN 2-PAK 0.3 MG/0.3ML SOAJ injection Inject 0.3 mg as directed daily as needed (for anaphylactic reactions.).  05/19/13  Yes [provider]  escitalopram (LEXAPRO) 20 MG tablet Take 20 mg by mouth daily.   Yes [provider]  furosemide (LASIX) 20 MG tablet Take 10 mg by mouth daily as needed. For swelling/fluid retention. 10/15/17  Yes [provider]  HYDROcodone-acetaminophen (NORCO) 10-325 MG per tablet Take 1 tablet by mouth every 8 (eight) hours as needed. Pain Patient taking differently: Take 1 tablet by mouth every 4 (four) hours as needed for moderate pain.  11/03/11  Yes Lendon Colonel, NP  isosorbide mononitrate (IMDUR) 30 MG 24 hr tablet Take 0.5 tablets (15 mg total) by mouth daily. 05/02/18 07/31/18 Yes Branch, Alphonse Guild, MD  levocetirizine (XYZAL) 5 MG tablet Take 5 mg by mouth at bedtime as needed for allergies.   Yes [provider]  pantoprazole (PROTONIX) 40 MG tablet Take 40 mg by mouth daily before breakfast.    Yes [provider]  spironolactone (ALDACTONE) 25 MG tablet Take 12.5 mg by mouth daily as needed. For swelling/fluid retention. 10/15/17  Yes [provider]    Family History Family History  Problem Relation Age of Onset  . Coronary artery disease Mother   . Coronary artery disease Father   . Arrhythmia Brother   . Colon cancer Neg Hx     Social History Social History   Tobacco Use  . Smoking status: Current Every Day Smoker    Packs/day: 0.50    Years: 46.00    Pack years: 23.00    Types: Cigarettes    Start date: 12/12/1969  . Smokeless tobacco: Never Used  Substance Use Topics  . Alcohol use: No    Alcohol/week: 0.0 standard drinks    Comment: 06/14/2012 "used to drink 12 pk/night; quit  for good 2 yr ago"  . Drug use: No     Allergies   Metoprolol; Peach flavor; Morphine and related; Bee venom; and Lisinopril   Review of Systems Review of Systems  All other systems reviewed and are negative.    Physical Exam Updated Vital Signs BP 122/81   Pulse (!) 57   Temp (!) 97.5 F (36.4 C) (Temporal) Comment: pt drinking beverage in triage  Resp 18   Ht 5\' 11"  (1.803 m)   Wt 94.8 kg   SpO2 95%   BMI 29.15 kg/m   Physical Exam Vitals signs and nursing note reviewed.  Constitutional:      Appearance: He is well-developed.  HENT:     Head: Normocephalic and atraumatic.     Right Ear: External ear normal.     Left Ear: External ear normal.  Eyes:     Conjunctiva/sclera: Conjunctivae normal.     Pupils: Pupils are equal, round, and reactive to light.  Neck:     Musculoskeletal: Normal range of motion and neck supple.     Trachea: Phonation normal.  Cardiovascular:     Rate and Rhythm: Normal rate and regular rhythm.  No extrasystoles are present.    Pulses: No decreased pulses.     Heart sounds: Normal heart sounds. No murmur. No friction rub.     Comments: No JVD Pulmonary:     Effort: Pulmonary effort is normal. No tachypnea, bradypnea, accessory muscle usage or respiratory distress.     Breath sounds: Normal breath sounds. No decreased breath sounds, wheezing or rhonchi.     Comments: Normal oxygenation on nasal cannula supplementation. Chest:     Chest wall: No mass or deformity.  Abdominal:     Palpations: Abdomen is soft.     Tenderness: There is no abdominal tenderness.  Musculoskeletal: Normal range of motion.     Right lower leg: No edema.     Left lower leg: No edema.  Skin:    General: Skin is warm and dry.  Neurological:     Mental Status: He is alert and oriented to person, place, and time.     Cranial Nerves: No cranial nerve deficit.     Sensory: No sensory deficit.     Motor: No abnormal muscle tone.     Coordination: Coordination  normal.  Psychiatric:        Behavior: Behavior normal.        Thought Content: Thought content normal.        Judgment: Judgment normal.  ED Treatments / Results  Labs (all labs ordered are listed, but only abnormal results are displayed) Labs Reviewed  CBC WITH DIFFERENTIAL/PLATELET - Abnormal; Notable for the following components:      Result Value   WBC 10.9 (*)    Abs Immature Granulocytes 0.16 (*)    All other components within normal limits  COMPREHENSIVE METABOLIC PANEL - Abnormal; Notable for the following components:   Glucose, Bld 188 (*)    Calcium 8.5 (*)    All other components within normal limits  BRAIN NATRIURETIC PEPTIDE - Abnormal; Notable for the following components:   B Natriuretic Peptide 601.0 (*)    All other components within normal limits  D-DIMER, QUANTITATIVE (NOT AT [facility]) - Abnormal; Notable for the following components:   D-Dimer, Quant 1.38 (*)    All other components within normal limits  TROPONIN I    EKG EKG Interpretation  Date/Time:  Friday June 21 2018 11:59:44 EST Ventricular Rate:  74 PR Interval:    QRS Duration: 90 QT Interval:  416 QTC Calculation: 462 R Axis:   47 Text Interpretation:  Sinus rhythm since last tracing no significant change Confirmed by Daleen Bo [id number] on 06/21/2018 12:17:13 PM   Radiology Dg Chest 2 View  Result Date: 06/21/2018 CLINICAL DATA:  Family reports pt with SOB since thanksgiving and increase SOB on Christmas day. Pt reports weakness and falling on Wednesday. Hx pneumonia/hx lung cancer/copd/chf/smoker/ hx heart cath and defibrillator placement EXAM: CHEST - 2 VIEW COMPARISON:  05/10/2018 FINDINGS: The patient has had median sternotomy and CABG. LEFT-sided electronic cardiac device lead overlies the RIGHT ventricle. Heart size is normal. The coarse patchy densities identified in the lung bases bilaterally, similar in appearance to previous study. Bilateral pleural thickening.  Emphysematous changes. IMPRESSION: Stable appearance of bilateral LOWER lobe opacities. Electronically Signed   By: Nolon Nations M.D.   On: 06/21/2018 12:58   Ct Angio Chest Pe W/cm &/or Wo Cm  Result Date: 06/21/2018 CLINICAL DATA:  62 year old male with shortness of breath since 05/23/2018 but worse the past 3 days. EXAM: CT ANGIOGRAPHY CHEST WITH CONTRAST TECHNIQUE: Multidetector CT imaging of the chest was performed using the standard protocol during bolus administration of intravenous contrast. Multiplanar CT image reconstructions and MIPs were obtained to evaluate the vascular anatomy. CONTRAST:  156mL ISOVUE-370 IOPAMIDOL (ISOVUE-370) INJECTION 76% COMPARISON:  Prior CT scan of the chest 11/28/2017 FINDINGS: Cardiovascular: Excellent opacification of the pulmonary arteries to the proximal subsegmental level. No evidence of central filling defect to suggest acute pulmonary embolus. The main pulmonary artery is mildly enlarged at 3 cm in diameter. Conventional 3 vessel arch anatomy. Surgical changes of prior multivessel CABG. Cardiomegaly. Left subclavian approach cardiac rhythm maintenance device with a single lead terminating in the right ventricular apex. Surgical changes of prior mitral valve annuloplasty. No pericardial effusion. Mediastinum/Nodes: Unremarkable thyroid gland. Progressive multifocal mediastinal and hilar adenopathy. An index right high paratracheal lymph node now measures 1.2 cm in short axis on image 21 of series 6 compared to 1.0 cm on 11/28/2017. Additional comparison C include a now 1.1 cm high right paratracheal node (image 23, series 6) compared to 0.7 cm previously, 1.3 cm left hilar lymph node (image 38, series 6) compared to 1.0 cm, and a right infrahilar lymph node measuring 1.4 cm (image 55, series 6) compared to 0.7 cm previously. Unremarkable thoracic esophagus. Lungs/Pleura: Combined centrilobular and paraseptal pulmonary emphysema. Mild interseptal thickening and  diffuse mild ground-glass attenuation airspace opacity both increased compared  to prior and suggesting mild interstitial pulmonary edema. 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 Chronic consolidation/scarring present in the posterior right lower lobe without interval change. Small loculated right lateral pleural effusion minimally enlarged compared to prior. Upper Abdomen: Tiny punctate calcifications scattered throughout the liver consistent with old granulomatous disease. No acute abnormality within the visualized upper abdomen. Musculoskeletal: No acute fracture or aggressive appearing lytic or blastic osseous lesion. Review of the MIP images confirms the above findings. IMPRESSION: 1. Negative for acute pulmonary embolus. 2. CT findings are most consistent with mild CHF. 3. Slight interval progression of multifocal mediastinal lymphadenopathy most likely reflects congestion in the setting of acute exacerbation of chronic CHF. 4. 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 Emphysema (ICD10-J43.9). 5. Enlarged main pulmonary artery as can be seen in the setting of pulmonary arterial hypertension. 6. Coronary artery calcifications status post CABG. 7. Cardiomegaly with automated intraventricular defibrillator in place. 8. Slight interval enlargement of loculated right lateral pleural effusion. 9. Chronic scarring/post radiation changes in the posterior right lower lobe, unchanged. Aortic Atherosclerosis (ICD10-I70.0). Electronically Signed   By: Jacqulynn Cadet M.D.   On: 06/21/2018 14:13    Procedures .Critical Care Performed by: Daleen Bo, MD Authorized by: Daleen Bo, MD   Critical care provider  statement:    Critical care time (minutes):  35   Critical care start time:  06/21/2018 12:10 PM   Critical care end time:  06/21/2018 2:33 PM   Critical care time was exclusive of:  Separately billable procedures and treating other patients   Critical care was necessary to treat or prevent imminent or life-threatening deterioration of the following conditions:  Cardiac failure   Critical care was time spent personally by me on the following activities:  Blood draw for specimens, development of treatment plan with patient or surrogate, discussions with consultants, evaluation of patient's response to treatment, examination of patient, obtaining history from patient or surrogate, ordering and performing treatments and interventions, ordering and review of laboratory studies, pulse oximetry, re-evaluation of patient's condition, review of old charts and ordering and review of radiographic studies   (including critical care time)  Medications Ordered in ED Medications  iopamidol (ISOVUE-370) 76 % injection 100 mL (100 mLs Intravenous Contrast Given 06/21/18 1348)  furosemide (LASIX) injection 40 mg (40 mg Intravenous Given 06/21/18 1457)     Initial Impression / Assessment and Plan / ED Course  I have reviewed the triage vital signs and the nursing notes.  Pertinent labs & imaging results that were available during my care of the patient were reviewed by me and considered in my medical decision making (see chart for details).  Clinical Course as of Jun 22 1523  Fri Jun 21, 2018  1226 Case presentation discussed with case management, she states that the patient cannot be sent home from the emergency department with oxygen.   [EW]  1336 Abnormal, high  D-dimer, quantitative(!) [EW]  1336 High  Brain natriuretic peptide(!) [EW]  1336 Normal  Troponin I - ONCE - STAT [EW]  1336 Normal except regular  CBC with Differential(!) [EW]  1336 Normal except glucose high, calcium low    Comprehensive metabolic panel(!) [EW]  4196 No infiltrate, or CHF; images reviewed by me.  DG Chest 2 View [EW]  2229 Consistent with CHF,  no PE, images reviewed by me  CT Angio Chest PE W/Cm &/Or Wo Cm [EW]    Clinical Course User Index [EW] Daleen Bo, MD     Patient Vitals for the past 24 hrs:  BP Temp Temp src Pulse Resp SpO2 Height Weight  06/21/18 1430 122/81 - - (!) 57 18 95 % - -  06/21/18 1400 130/77 - - 65 13 (!) 86 % - -  06/21/18 1330 132/76 - - (!) 58 16 94 % - -  06/21/18 1300 128/65 - - 66 19 96 % - -  06/21/18 1230 136/76 - - 66 (!) 32 95 % - -  06/21/18 1150 - - - 70 20 93 % - -  06/21/18 1132 - - - - - (!) 83 % - -  06/21/18 1130 138/67 (!) 97.5 F (36.4 C) Temporal 75 20 (!) 81 % 5\' 11"  (1.803 m) 94.8 kg    3:24 PM Reevaluation with update and discussion. After initial assessment and treatment, an updated evaluation reveals no change in status he remains comfortable and has no further complaints.Daleen Bo   Medical Decision Making: Shortness of breath likely multifactorial with hypoxia.  No evidence for ACS.  He is on multiple medications, potentially causative versus not adequately adjusted.  No clinical evidence for PE.  D-dimer ordered for screening of low risk for PE.  CT angiogram negative for PE.  CT is consistent with congestive heart failure, acute on chronic.  BNP elevated.  He has a history of ischemic cardiomyopathy with EF 15 to 20%.  Patient takes as needed Lasix, and regular spironolactone.  He has not had overt signs of CHF exacerbation so his wife is not giving him his Lasix recently.  Patient will require hospitalization for oxygen support and diuresis, which hopefully will improve his oxygenation status.  Doubt ACS, or metabolic instability.  CRITICAL CARE-yes Performed by: Daleen Bo   Nursing Notes Reviewed/ Care Coordinated Applicable Imaging Reviewed Interpretation of Laboratory Data incorporated into ED treatment  2:43  PM-Consult complete with hospitalist. Patient case explained and discussed.  She agrees to admit patient for further evaluation and treatment. Call ended at 3:22 PM  Plan: Admit  Final Clinical Impressions(s) / ED Diagnoses   Final diagnoses:  Acute on chronic congestive heart failure, unspecified heart failure type [facility])    ED Discharge Orders    None       Daleen Bo, MD 06/21/18 1524

## 2018-06-21 NOTE — H&P (Addendum)
History and Physical    Charles Savage QHU:[id number] DOB: [birth year] DOA: 06/21/2018  PCP: Sharilyn Sites, MD Patient coming from: Home  Chief Complaint: Shortness of breath  HPI: Charles Savage is a 62 y.o. male with medical history significant of CVA, CHF s/p IVCD, COPD, CAD, hyperlipidemia presenting to the hospital for evaluation of shortness of breath.  Patient reports having dyspnea on exertion since Thanksgiving which has been getting progressively worse.  Denies having any orthopnea, paroxysmal nocturnal dyspnea, or lower extremity edema.  States his cardiologist had stopped Lasix and spironolactone in April as he was not having any swelling in his legs.  Denies increased dietary sodium or fluid intake.  He does not use home oxygen.  Reports having intermittent chest heaviness since Thanksgiving.  Last episode of chest heaviness was on Christmas Day when he also felt like both of his legs were weak and giving out.  States he has been having weakness in his legs previously as well and his cardiologist had done some tests.  He was told that he did not have any blood clots in his legs or any blockages.  States he also had a stress test in November.  ED Course: Oxygen saturation as low as 77% on room air per ED provider.  Afebrile.  White count 10.9.  BNP 601, no previous baseline.  D-dimer 1.38.  Troponin negative and EKG not suggestive of ACS.  CT angiogram of chest negative for acute PE; findings are most consistent with mild CHF.  Also showing slight interval enlargement of a small loculated right lateral pleural effusion.  Patient received IV Lasix 40 mg in the ED.  Review of Systems: As per HPI otherwise 10 point review of systems negative.  Past Medical History:  Diagnosis Date  . 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 Arthritis    "back; knees" (06/14/2012)  . Cerebrovascular disease    h/o CVA in 07/2011 + left carotid  endarterectomy  . CHF (congestive heart failure) ([place])   . Cholelithiasis 07/05/2012   Asymptomatic; identified on CT scanning of the chest;  . Chronic lower back pain   . COPD (chronic obstructive pulmonary disease) ([place])    on 2L O2 at home since 08/2011  . Coronary artery disease    a. s/p CABG in 09/2011 with LIMA-LAD, Seq SVG-OM1-OM2-OM3, SVG-PDA, and SVG-RCA  . DDD (degenerative disc disease), lumbosacral   . Depression 09/2011   "after OHS" (06/14/2012)  . Hyperlipemia   . ICD (implantable cardiac defibrillator) in place   . Ischemic cardiomyopathy    a. EF previously 15-20% in 2013, s/p [employer] ICD placement b. repeat echo in 2017 showing EF had improved to 55-60%)  . Lung cancer ([place]) 2018   Radiation treatment  . Myocardial infarction ([place]) 08/2011  . Other primary cardiomyopathies   . Pneumonia   . Sleep apnea   . Stroke [facility]) 07/2011   denies residual (06/14/2012)    Past Surgical History:  Procedure Laterality Date  . BACK SURGERY    . CARDIAC CATHETERIZATION  09/2011  . CARDIAC DEFIBRILLATOR PLACEMENT  06/14/2012  . CAROTID ENDARTERECTOMY  07/2011   "left" (06/14/2012)  . COLONOSCOPY N/A 10/16/2014   Dr. Gala Romney: rectal polyps removed (hyperplastic). next TCS 10 years.   . CORONARY ARTERY BYPASS GRAFT  10/20/2011   Procedure: CORONARY ARTERY BYPASS GRAFTING (CABG);  Surgeon: [name], MD;  Location: [place];  Service: Open Heart Surgery;  Laterality:  N/A;  CABG x six;  using left internal mammary artery and right leg greater saphenous vein harvested endoscopically  . ESOPHAGOGASTRODUODENOSCOPY (EGD) WITH PROPOFOL N/A 11/12/2017   Procedure: ESOPHAGOGASTRODUODENOSCOPY (EGD) WITH PROPOFOL;  Surgeon: Daneil Dolin, MD;  [facility];  Service: Endoscopy;  Laterality: N/A;  7:30am  . ESOPHAGOGASTRODUODENOSCOPY (EGD) WITH PROPOFOL N/A 12/24/2017   Procedure: ESOPHAGOGASTRODUODENOSCOPY (EGD) WITH PROPOFOL;  Surgeon: Daneil Dolin, MD;  [facility];   Service: Endoscopy;  Laterality: N/A;  1:00pm  . FOREIGN BODY REMOVAL  06/05/2011   Procedure: FOREIGN BODY REMOVAL ADULT;  Surgeon: Hermelinda Dellen;  Location: [place];  Service: Plastics;  Laterality: Right;  removal foreign body from right side face   . IMPLANTABLE CARDIOVERTER DEFIBRILLATOR IMPLANT N/A 06/14/2012   Procedure: IMPLANTABLE CARDIOVERTER DEFIBRILLATOR IMPLANT;  Surgeon: Evans Lance, MD;  Location: [facility] CATH LAB;  Service: Cardiovascular;  Laterality: N/A;  . KNEE ARTHROSCOPY  2002   "right" (06/14/2012)  . LEFT AND RIGHT HEART CATHETERIZATION WITH CORONARY ANGIOGRAM N/A 10/16/2011   Procedure: LEFT AND RIGHT HEART CATHETERIZATION WITH CORONARY ANGIOGRAM;  Surgeon: Jolaine Artist, MD;  Location: [facility] CATH LAB;  Service: Cardiovascular;  Laterality: N/A;  . LUMBAR [place] SURGERY  2012  . MALONEY DILATION N/A 12/24/2017   Procedure: Venia Minks DILATION;  Surgeon: Daneil Dolin, MD;  [facility];  Service: Endoscopy;  Laterality: N/A;  . MITRAL VALVE REPAIR  10/20/2011   Procedure: MITRAL VALVE REPAIR (MVR);  Surgeon: [name], MD;  Location: [place];  Service: Open Heart Surgery;  Laterality: N/A;  . POSTERIOR LUMBAR FUSION  1996  . TEE WITHOUT CARDIOVERSION  10/17/2011   Procedure: TRANSESOPHAGEAL ECHOCARDIOGRAM (TEE);  Surgeon: Jolaine Artist, MD;  Location: [facility] ENDOSCOPY;  Service: Cardiovascular;  Laterality: N/A;     reports that he has been smoking cigarettes. He started smoking about 48 years ago. He has a 23.00 pack-year smoking history. He has never used smokeless tobacco. He reports that he does not drink alcohol or use drugs.  Allergies  Allergen Reactions  . Metoprolol Shortness Of Breath and Nausea And Vomiting  . Peach Flavor Hives, Itching and Other (See Comments)    "peaches; peach flavoring; actually happened after he ate a store-bought peach pie"  . Morphine And Related Hives and Itching  . Bee Venom Hives and Swelling  .  Lisinopril Cough    Family History  Problem Relation Age of Onset  . Coronary artery disease Mother   . Coronary artery disease Father   . Arrhythmia Brother   . Colon cancer Neg Hx     Prior to Admission medications   Medication Sig Start Date End Date Taking? Authorizing Provider  aspirin 81 MG tablet Take 81 mg by mouth daily.   Yes [provider]  atorvastatin (LIPITOR) 80 MG tablet TAKE ONE (1) TABLET EACH DAY Patient taking differently: Take 80 mg by mouth every evening.  07/10/17  Yes BranchAlphonse Guild, MD  carvedilol (COREG) 12.5 MG tablet Take 1-2 tablets (12.5-25 mg total) by mouth See admin instructions. Take 12.5 mg am and 25 mg ( 2 tablets) in the pm 10/31/17  Yes Branch, Alphonse Guild, MD  celecoxib (CELEBREX) 200 MG capsule Take 200 mg by mouth daily.  01/04/17  Yes [provider]  EPIPEN 2-PAK 0.3 MG/0.3ML SOAJ injection Inject 0.3 mg as directed daily as needed (for anaphylactic reactions.).  05/19/13  Yes [provider]  escitalopram (  LEXAPRO) 20 MG tablet Take 20 mg by mouth daily.   Yes [provider]  furosemide (LASIX) 20 MG tablet Take 10 mg by mouth daily as needed. For swelling/fluid retention. 10/15/17  Yes [provider]  HYDROcodone-acetaminophen (NORCO) 10-325 MG per tablet Take 1 tablet by mouth every 8 (eight) hours as needed. Pain Patient taking differently: Take 1 tablet by mouth every 4 (four) hours as needed for moderate pain.  11/03/11  Yes Lendon Colonel, NP  isosorbide mononitrate (IMDUR) 30 MG 24 hr tablet Take 0.5 tablets (15 mg total) by mouth daily. 05/02/18 07/31/18 Yes Branch, Alphonse Guild, MD  levocetirizine (XYZAL) 5 MG tablet Take 5 mg by mouth at bedtime as needed for allergies.   Yes [provider]  pantoprazole (PROTONIX) 40 MG tablet Take 40 mg by mouth daily before breakfast.    Yes [provider]  spironolactone (ALDACTONE) 25 MG tablet Take 12.5 mg by mouth daily as needed.  For swelling/fluid retention. 10/15/17  Yes [provider]    Physical Exam: Vitals:   06/21/18 1630 06/21/18 2006 06/21/18 2041 06/21/18 2148  BP: (!) 131/91 130/73 129/73 121/74  Pulse: 60 60 63 61  Resp: 15 18 17 18   Temp:   97.7 F (36.5 C) 98 F (36.7 C)  TempSrc:   Oral Oral  SpO2: 94% 93% 90% 95%  Weight:   94.2 kg   Height:   5\' 11"  (1.803 m)     Physical Exam  Constitutional: He is oriented to person, place, and time. He appears well-developed and well-nourished. No distress.  HENT:  Head: Normocephalic.  Mouth/Throat: Oropharynx is clear and moist.  Eyes: Right eye exhibits no discharge. Left eye exhibits no discharge.  Neck: Neck supple. JVD present.  Mild JVD  Cardiovascular: Normal rate, regular rhythm and intact distal pulses.  Pulmonary/Chest: Effort normal and breath sounds normal. No respiratory distress. He has no wheezes. He has no rales.  No increased work of breathing On 3 L supplemental oxygen  Abdominal: Soft. Bowel sounds are normal. He exhibits no distension. There is no abdominal tenderness.  Musculoskeletal:        General: No edema.  Neurological: He is alert and oriented to person, place, and time. No cranial nerve deficit.  Speech fluent Tongue midline No facial droop Strength 5 out of 5 in bilateral upper and lower extremities.   Sensation to light touch intact throughout.  Skin: Skin is warm and dry. He is not diaphoretic.  Psychiatric: He has a normal mood and affect. His behavior is normal.     Labs on Admission: I have personally reviewed following labs and imaging studies  CBC: Recent Labs  Lab 06/21/18 1158  WBC 10.9*  NEUTROABS 7.7  HGB 13.6  HCT 41.3  MCV 92.8  PLT 425   Basic Metabolic Panel: Recent Labs  Lab 06/21/18 1231  NA 139  K 4.3  CL 107  CO2 26  GLUCOSE 188*  BUN 19  CREATININE 1.15  CALCIUM 8.5*   GFR: Estimated Creatinine Clearance: 77.1 mL/min (by C-G formula based on SCr of 1.15  mg/dL). Liver Function Tests: Recent Labs  Lab 06/21/18 1231  AST 18  ALT 18  ALKPHOS 53  BILITOT 0.8  PROT 6.9  ALBUMIN 3.6   No results for input(s): LIPASE, AMYLASE in the last 168 hours. No results for input(s): AMMONIA in the last 168 hours. Coagulation Profile: No results for input(s): INR, PROTIME in the last 168 hours. Cardiac  Enzymes: Recent Labs  Lab 06/21/18 1231  TROPONINI <0.03   BNP (last 3 results) No results for input(s): PROBNP in the last 8760 hours. HbA1C: No results for input(s): HGBA1C in the last 72 hours. CBG: No results for input(s): GLUCAP in the last 168 hours. Lipid Profile: No results for input(s): CHOL, HDL, LDLCALC, TRIG, CHOLHDL, LDLDIRECT in the last 72 hours. Thyroid Function Tests: No results for input(s): TSH, T4TOTAL, FREET4, T3FREE, THYROIDAB in the last 72 hours. Anemia Panel: No results for input(s): VITAMINB12, FOLATE, FERRITIN, TIBC, IRON, RETICCTPCT in the last 72 hours. Urine analysis:    Component Value Date/Time   COLORURINE YELLOW 10/19/2011 0609   APPEARANCEUR CLEAR 10/19/2011 0609   LABSPEC 1.019 10/19/2011 0609   PHURINE 7.5 10/19/2011 0609   GLUCOSEU NEGATIVE 10/19/2011 0609   HGBUR NEGATIVE 10/19/2011 0609   BILIRUBINUR NEGATIVE 10/19/2011 0609   KETONESUR NEGATIVE 10/19/2011 0609   PROTEINUR 30 (A) 10/19/2011 0609   UROBILINOGEN 0.2 10/19/2011 0609   NITRITE NEGATIVE 10/19/2011 0609   LEUKOCYTESUR NEGATIVE 10/19/2011 7341    Radiological Exams on Admission: Dg Chest 2 View  Result Date: 06/21/2018 CLINICAL DATA:  Family reports pt with SOB since thanksgiving and increase SOB on Christmas day. Pt reports weakness and falling on Wednesday. Hx pneumonia/hx lung cancer/copd/chf/smoker/ hx heart cath and defibrillator placement EXAM: CHEST - 2 VIEW COMPARISON:  05/10/2018 FINDINGS: The patient has had median sternotomy and CABG. LEFT-sided electronic cardiac device lead overlies the RIGHT ventricle. Heart size is  normal. The coarse patchy densities identified in the lung bases bilaterally, similar in appearance to previous study. Bilateral pleural thickening. Emphysematous changes. IMPRESSION: Stable appearance of bilateral LOWER lobe opacities. Electronically Signed   By: Nolon Nations M.D.   On: 06/21/2018 12:58   Ct Angio Chest Pe W/cm &/or Wo Cm  Result Date: 06/21/2018 CLINICAL DATA:  62 year old male with shortness of breath since 05/23/2018 but worse the past 3 days. EXAM: CT ANGIOGRAPHY CHEST WITH CONTRAST TECHNIQUE: Multidetector CT imaging of the chest was performed using the standard protocol during bolus administration of intravenous contrast. Multiplanar CT image reconstructions and MIPs were obtained to evaluate the vascular anatomy. CONTRAST:  165mL ISOVUE-370 IOPAMIDOL (ISOVUE-370) INJECTION 76% COMPARISON:  Prior CT scan of the chest 11/28/2017 FINDINGS: Cardiovascular: Excellent opacification of the pulmonary arteries to the proximal subsegmental level. No evidence of central filling defect to suggest acute pulmonary embolus. The main pulmonary artery is mildly enlarged at 3 cm in diameter. Conventional 3 vessel arch anatomy. Surgical changes of prior multivessel CABG. Cardiomegaly. Left subclavian approach cardiac rhythm maintenance device with a single lead terminating in the right ventricular apex. Surgical changes of prior mitral valve annuloplasty. No pericardial effusion. Mediastinum/Nodes: Unremarkable thyroid gland. Progressive multifocal mediastinal and hilar adenopathy. An index right high paratracheal lymph node now measures 1.2 cm in short axis on image 21 of series 6 compared to 1.0 cm on 11/28/2017. Additional comparison C include a now 1.1 cm high right paratracheal node (image 23, series 6) compared to 0.7 cm previously, 1.3 cm left hilar lymph node (image 38, series 6) compared to 1.0 cm, and a right infrahilar lymph node measuring 1.4 cm (image 55, series 6) compared to 0.7 cm  previously. Unremarkable thoracic esophagus. Lungs/Pleura: Combined centrilobular and paraseptal pulmonary emphysema. Mild interseptal thickening and diffuse mild ground-glass attenuation airspace opacity both increased compared to prior and suggesting mild interstitial pulmonary edema. Additionally, there is subpleural reticulation, architectural distortion and honeycombing in the periphery of the lung bases  most consistent with pulmonary fibrosis in a usual interstitial pneumonitis pattern. Chronic consolidation/scarring present in the posterior right lower lobe without interval change. Small loculated right lateral pleural effusion minimally enlarged compared to prior. Upper Abdomen: Tiny punctate calcifications scattered throughout the liver consistent with old granulomatous disease. No acute abnormality within the visualized upper abdomen. Musculoskeletal: No acute fracture or aggressive appearing lytic or blastic osseous lesion. Review of the MIP images confirms the above findings. IMPRESSION: 1. Negative for acute pulmonary embolus. 2. CT findings are most consistent with mild CHF. 3. Slight interval progression of multifocal mediastinal lymphadenopathy most likely reflects congestion in the setting of acute exacerbation of chronic CHF. 4. 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 Emphysema (ICD10-J43.9). 5. Enlarged main pulmonary artery as can be seen in the setting of pulmonary arterial hypertension. 6. Coronary artery calcifications status post CABG. 7. Cardiomegaly with automated intraventricular defibrillator in place. 8. Slight interval enlargement of loculated right lateral pleural effusion. 9. Chronic scarring/post radiation changes in the posterior right lower lobe, unchanged. Aortic Atherosclerosis (ICD10-I70.0). Electronically Signed   By: Jacqulynn Cadet M.D.   On:  06/21/2018 14:13    EKG: Independently reviewed.  Sinus rhythm (heart rate 74).  Assessment/Plan Principal Problem:   Acute exacerbation of CHF (congestive heart failure) (HCC) Active Problems:   CAD (coronary artery disease)   Loculated pleural effusion   Angina pectoris (HCC)   Physical deconditioning   OSA (obstructive sleep apnea)   Chronic pain   Acute hypoxic respiratory failure secondary to exacerbation of chronic systolic and diastolic congestive heart failure History of ischemic cardiomyopathy.  Per cardiology documentation, EF previously 15 to 20% in 2013.  He is status post ICD placement and is followed by Dr. Lovena Le.  EF improved to 55 to 60% on most recent echo done in 2017.  He is currently not taking a diuretic.  Presenting with progressive dyspnea on exertion since Thanksgiving. Oxygen saturation as low as 77% on room air per ED provider. BNP 601, no previous baseline.  D-dimer 1.38. CT angiogram of chest negative for acute PE; findings are most consistent with mild CHF. -Received IV Lasix 40 mg in the ED.  Will order additional dose of IV Lasix 40 mg to be administered in the morning. -Monitor intake and output -Daily weights -Low-sodium diet with fluid restriction -Continue to monitor renal function with IV diuresis -Supplemental oxygen  Loculated pleural effusion White count 10.9.  Patient is afebrile and nontoxic-appearing. CTA showing slight interval enlargement of a small loculated right lateral pleural effusion.  Pleural effusion seen on CT chest done in December 2018 as well.  I discussed the patient's case and imaging with Dr. Anselm Pancoast from interventional radiology at [facility].  His recommendation is to get an ultrasound of the chest to assess the size and complexity of the loculated pleural effusion.  Stated he is on call this weekend and can be called tomorrow after the ultrasound to discuss the results.  Patient may need outpatient thoracentesis by CT surgery depending  on what the ultrasound shows. -Ultrasound of chest has been ordered.  Please discuss findings with Dr. Anselm Pancoast. -Repeat CBC in a.m.  Angina, history of CAD Complaining of intermittent chest heaviness since Thanksgiving.  Last episode was on December 25.  No chest pain at present.  Troponin negative and EKG not suggestive of ACS. Last cardiology visit on June 05, 2018.  History of CABG in 2013.  Per documentation, recent NST showed findings consistent with scar and mild peri-infarct ischemia.  Plan was to continue medical management unless he developed refractory symptoms. -Continue aspirin, statin, beta-blocker, Imdur -Outpatient cardiology follow-up  Physical deconditioning, bilateral leg weakness Bilateral lower extremity strength 5 out of 5 on exam.  No focal neuro deficit appreciated. -PT evaluation  OSA -Continue CPAP at night  Chronic pain -Continue home Celebrex, Norco prn  GERD -Continue PPI  DVT prophylaxis: Subcutaneous heparin Code Status: Patient wishes to be full code. Family Communication: Wife and sister at bedside. Disposition Plan: Anticipate discharge to home in 1 to 2 days. Consults called: None Admission status: Observation   Shela Leff MD [facility] Pager [phone]  If 7PM-7AM, please contact night-coverage [email or web address] Password [facility]  06/21/2018, 10:33 PM

## 2018-06-21 NOTE — ED Notes (Signed)
Patient returned from [place]. This nurse went in to check on patient and noticed patient's O2 saturation 77%. Patient had nasal canula in place and was hooked to oxygen on wall but oxygen was not turned on. Patient reports feeling short of breath. Cut O2 on 2L via nasal canula. O2 saturation increased to 88%. Increased oxygen to 3L, O2 saturation increased to 92%. Patient reports shortness of breath is subsiding.

## 2018-06-22 ENCOUNTER — Observation Stay (HOSPITAL_COMMUNITY): Payer: Medicare HMO

## 2018-06-22 LAB — BASIC METABOLIC PANEL
Anion gap: 9 (ref 5–15)
BUN: 15 mg/dL (ref 8–23)
CO2: 26 mmol/L (ref 22–32)
Calcium: 8.6 mg/dL — ABNORMAL LOW (ref 8.9–10.3)
Chloride: 104 mmol/L (ref 98–111)
Creatinine, Ser: 1.05 mg/dL (ref 0.61–1.24)
GFR calc Af Amer: >60 mL/min (ref >60)
GFR calc non Af Amer: >60 mL/min (ref >60)
Glucose, Bld: 149 mg/dL — ABNORMAL HIGH (ref 70–99)
Potassium: 3.6 mmol/L (ref 3.5–5.1)
Sodium: 139 mmol/L (ref 135–145)

## 2018-06-22 LAB — CBC
HCT: 42.6 % (ref 39.0–52.0)
HEMOGLOBIN: 14.3 g/dL (ref 13.0–17.0)
MCH: 31 pg (ref 26.0–34.0)
MCHC: 33.6 g/dL (ref 30.0–36.0)
MCV: 92.2 fL (ref 80.0–100.0)
Platelets: 234 10*3/uL (ref 150–400)
RBC: 4.62 MIL/uL (ref 4.22–5.81)
RDW: 14.4 % (ref 11.5–15.5)
WBC: 9 10*3/uL (ref 4.0–10.5)
nRBC: 0 % (ref 0.0–0.2)

## 2018-06-22 MED ORDER — FUROSEMIDE 10 MG/ML IJ SOLN
40.0000 mg | Freq: Two times a day (BID) | INTRAMUSCULAR | Status: DC
  Administered 2018-06-22 – 2018-06-23 (×3): 40 mg via INTRAVENOUS
  Filled 2018-06-22 (×5): qty 4

## 2018-06-22 NOTE — Progress Notes (Signed)
Patient ID: Charles Savage, male   DOB: August 25, 1955, 62 y.o.   MRN: [id number]  PROGRESS NOTE    Charles Savage  Charles Savage DOB: 11-24-1955 DOA: 06/21/2018 PCP: Sharilyn Sites, MD   Brief Narrative:  Charles Savage is a 62 y.o. male with medical history significant of CVA, CHF s/p IVCD, COPD, CAD, hyperlipidemia presenting to the hospital for evaluation of shortness of breath.  Patient reports having dyspnea on exertion since Thanksgiving which has been getting progressively worse.  Denies having any orthopnea, paroxysmal nocturnal dyspnea, or lower extremity edema.  States his cardiologist had stopped Lasix and spironolactone in April as he was not having any swelling in his legs.  Denies increased dietary sodium or fluid intake.  He does not use home oxygen.  Reports having intermittent chest heaviness since Thanksgiving.  Last episode of chest heaviness was on Christmas Day when he also felt like both of his legs were weak and giving out.  States he has been having weakness in his legs previously as well and his cardiologist had done some tests.  He was told that he did not have any blood clots in his legs or any blockages.  States he also had a stress test in November.  Assessment & Plan:   Principal Problem:   Acute exacerbation of CHF (congestive heart failure) (HCC) Active Problems:   CAD (coronary artery disease)   Loculated pleural effusion   Angina pectoris (HCC)   Physical deconditioning   OSA (obstructive sleep apnea)   Chronic pain  Acute hypoxic respiratory failure secondary to exacerbation of chronic systolic and diastolic congestive heart failure History of ischemic cardiomyopathy.  Per cardiology documentation, EF previously 15 to 20% in 2013.  He is status post ICD placement and is followed by Dr. Lovena Le.  EF improved to 55 to 60% on most recent echo done in 2017.  He is currently not taking a diuretic.  Presenting with progressive dyspnea on exertion since Thanksgiving.  Oxygen saturation as low as 77% on room air per ED provider. BNP 601, no previous baseline.  D-dimer 1.38. CT angiogram of chest negative for acute PE; findings are most consistent with mild CHF. -Received IV Lasix 40 mg in the ED.   continue Lasix 40 mg IV every 12 hours.  Patient continued to be hypoxic especially with ambulation dropped to 78% on room air. -Monitor intake and output -Daily weights -Low-sodium diet with fluid restriction -Continue to monitor renal function with IV diuresis -Supplemental oxygen  Loculated pleural effusion White count 10.9.  Patient is afebrile and nontoxic-appearing. CTA showing slight interval enlargement of a small loculated right lateral pleural effusion.  Pleural effusion seen on CT chest done in December 2018 as well.  I discussed the patient's case and imaging with Dr. Anselm Pancoast from interventional radiology at [facility].  His recommendation is to get an ultrasound of the chest to assess the size and complexity of the loculated pleural effusion.  Stated he is on call this weekend and can be called tomorrow after the ultrasound to discuss the results.  Patient may need outpatient thoracentesis by CT surgery depending on what the ultrasound shows. -Ultrasound of chest has been ordered.  Please discuss findings with Dr. Anselm Pancoast. -Ultrasound chest still pending  history of CAD Serial enzymes negative.  Cardiology follow-up as an outpatient.  Physical deconditioning, bilateral leg weakness Bilateral lower extremity strength 5 out of 5 on exam.  No focal neuro deficit appreciated. -PT evaluation  OSA -Continue CPAP at night  Chronic pain -Continue home Celebrex, Norco prn  GERD -Continue PPI   DVT prophylaxis: Heparin Code Status: Full Family Communication: Wife  disposition Plan: When able to wean off of oxygen    Subjective: Patient feeling better.  Has diuresed about 3 L per his report  Objective: Vitals:   06/21/18 2041 06/21/18 2148 06/21/18  2314 06/22/18 0554  BP: 129/73 121/74  135/70  Pulse: 63 61  (!) 54  Resp: 17 18    Temp: 97.7 F (36.5 C) 98 F (36.7 C)  (!) 97.5 F (36.4 C)  TempSrc: Oral Oral  Oral  SpO2: 90% 95% 95% 93%  Weight: 94.2 kg     Height: 5\' 11"  (1.803 m)       Intake/Output Summary (Last 24 hours) at 06/22/2018 1039 Last data filed at 06/22/2018 0900 Gross per 24 hour  Intake 240 ml  Output 2800 ml  Net -2560 ml   Filed Weights   06/21/18 1130 06/21/18 2041  Weight: 94.8 kg 94.2 kg    Examination:  General exam: Appears calm and comfortable  Respiratory system: Clear to auscultation. Respiratory effort normal. Cardiovascular system: S1 & S2 heard, RRR. No JVD, murmurs, rubs, gallops or clicks. No pedal edema. Gastrointestinal system: Abdomen is nondistended, soft and nontender. No organomegaly or masses felt. Normal bowel sounds heard. Central nervous system: Alert and oriented. No focal neurological deficits. Extremities: Symmetric 5 x 5 power. Skin: No rashes, lesions or ulcers Psychiatry: Judgement and insight appear normal. Mood & affect appropriate.     Data Reviewed: I have personally reviewed following labs and imaging studies  CBC: Recent Labs  Lab 06/21/18 1158 06/22/18 0702  WBC 10.9* 9.0  NEUTROABS 7.7  --   HGB 13.6 14.3  HCT 41.3 42.6  MCV 92.8 92.2  PLT 224 578   Basic Metabolic Panel: Recent Labs  Lab 06/21/18 1231 06/22/18 0702  NA 139 139  K 4.3 3.6  CL 107 104  CO2 26 26  GLUCOSE 188* 149*  BUN 19 15  CREATININE 1.15 1.05  CALCIUM 8.5* 8.6*   GFR: Estimated Creatinine Clearance: 84.4 mL/min (by C-G formula based on SCr of 1.05 mg/dL). Liver Function Tests: Recent Labs  Lab 06/21/18 1231  AST 18  ALT 18  ALKPHOS 53  BILITOT 0.8  PROT 6.9  ALBUMIN 3.6   No results for input(s): LIPASE, AMYLASE in the last 168 hours. No results for input(s): AMMONIA in the last 168 hours. Coagulation Profile: No results for input(s): INR, PROTIME in  the last 168 hours. Cardiac Enzymes: Recent Labs  Lab 06/21/18 1231  TROPONINI <0.03   BNP (last 3 results) No results for input(s): PROBNP in the last 8760 hours. HbA1C: No results for input(s): HGBA1C in the last 72 hours. CBG: No results for input(s): GLUCAP in the last 168 hours. Lipid Profile: No results for input(s): CHOL, HDL, LDLCALC, TRIG, CHOLHDL, LDLDIRECT in the last 72 hours. Thyroid Function Tests: No results for input(s): TSH, T4TOTAL, FREET4, T3FREE, THYROIDAB in the last 72 hours. Anemia Panel: No results for input(s): VITAMINB12, FOLATE, FERRITIN, TIBC, IRON, RETICCTPCT in the last 72 hours. Sepsis Labs: No results for input(s): PROCALCITON, LATICACIDVEN in the last 168 hours.  No results found for this or any previous visit (from the past 240 hour(s)).       Radiology Studies: Dg Chest 2 View  Result Date: 06/21/2018 CLINICAL DATA:  Family reports pt with SOB since thanksgiving and increase SOB on Christmas day. Pt reports weakness  and falling on Wednesday. Hx pneumonia/hx lung cancer/copd/chf/smoker/ hx heart cath and defibrillator placement EXAM: CHEST - 2 VIEW COMPARISON:  05/10/2018 FINDINGS: The patient has had median sternotomy and CABG. LEFT-sided electronic cardiac device lead overlies the RIGHT ventricle. Heart size is normal. The coarse patchy densities identified in the lung bases bilaterally, similar in appearance to previous study. Bilateral pleural thickening. Emphysematous changes. IMPRESSION: Stable appearance of bilateral LOWER lobe opacities. Electronically Signed   By: Nolon Nations M.D.   On: 06/21/2018 12:58   Ct Angio Chest Pe W/cm &/or Wo Cm  Result Date: 06/21/2018 CLINICAL DATA:  62 year old male with shortness of breath since 05/23/2018 but worse the past 3 days. EXAM: CT ANGIOGRAPHY CHEST WITH CONTRAST TECHNIQUE: Multidetector CT imaging of the chest was performed using the standard protocol during bolus administration of  intravenous contrast. Multiplanar CT image reconstructions and MIPs were obtained to evaluate the vascular anatomy. CONTRAST:  14mL ISOVUE-370 IOPAMIDOL (ISOVUE-370) INJECTION 76% COMPARISON:  Prior CT scan of the chest 11/28/2017 FINDINGS: Cardiovascular: Excellent opacification of the pulmonary arteries to the proximal subsegmental level. No evidence of central filling defect to suggest acute pulmonary embolus. The main pulmonary artery is mildly enlarged at 3 cm in diameter. Conventional 3 vessel arch anatomy. Surgical changes of prior multivessel CABG. Cardiomegaly. Left subclavian approach cardiac rhythm maintenance device with a single lead terminating in the right ventricular apex. Surgical changes of prior mitral valve annuloplasty. No pericardial effusion. Mediastinum/Nodes: Unremarkable thyroid gland. Progressive multifocal mediastinal and hilar adenopathy. An index right high paratracheal lymph node now measures 1.2 cm in short axis on image 21 of series 6 compared to 1.0 cm on 11/28/2017. Additional comparison C include a now 1.1 cm high right paratracheal node (image 23, series 6) compared to 0.7 cm previously, 1.3 cm left hilar lymph node (image 38, series 6) compared to 1.0 cm, and a right infrahilar lymph node measuring 1.4 cm (image 55, series 6) compared to 0.7 cm previously. Unremarkable thoracic esophagus. Lungs/Pleura: Combined centrilobular and paraseptal pulmonary emphysema. Mild interseptal thickening and diffuse mild ground-glass attenuation airspace opacity both increased compared to prior and suggesting mild interstitial pulmonary edema. 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 Chronic consolidation/scarring present in the posterior right lower lobe without interval change. Small loculated right lateral pleural effusion  minimally enlarged compared to prior. Upper Abdomen: Tiny punctate calcifications scattered throughout the liver consistent with old granulomatous disease. No acute abnormality within the visualized upper abdomen. Musculoskeletal: No acute fracture or aggressive appearing lytic or blastic osseous lesion. Review of the MIP images confirms the above findings. IMPRESSION: 1. Negative for acute pulmonary embolus. 2. CT findings are most consistent with mild CHF. 3. Slight interval progression of multifocal mediastinal lymphadenopathy most likely reflects congestion in the setting of acute exacerbation of chronic CHF. 4. 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 Emphysema (ICD10-J43.9). 5. Enlarged main pulmonary artery as can be seen in the setting of pulmonary arterial hypertension. 6. Coronary artery calcifications status post CABG. 7. Cardiomegaly with automated intraventricular defibrillator in place. 8. Slight interval enlargement of loculated right lateral pleural effusion. 9. Chronic scarring/post radiation changes in the posterior right lower lobe, unchanged. Aortic Atherosclerosis (ICD10-I70.0). Electronically Signed   By: Jacqulynn Cadet M.D.   On: 06/21/2018 14:13  Scheduled Meds: . aspirin EC  81 mg Oral Daily  . atorvastatin  80 mg Oral QPM  . carvedilol  12.5 mg Oral q morning - 10a  . carvedilol  25 mg Oral QHS  . celecoxib  200 mg Oral Daily  . escitalopram  20 mg Oral Daily  . furosemide  40 mg Intravenous Q12H  . heparin  5,000 Units Subcutaneous Q8H  . isosorbide mononitrate  15 mg Oral Daily  . pantoprazole  40 mg Oral QAC breakfast   Continuous Infusions:   LOS: 0 days    Time spent: 25 minutes    Milayna Rotenberg A, MD [facility] Pager [phone]  If 7PM-7AM, please contact night-coverage [email or web address] Password  [facility] 06/22/2018, 10:39 AM

## 2018-06-22 NOTE — Plan of Care (Signed)
  Problem: Education: Goal: Knowledge of General Education information will improve Description Including pain rating scale, medication(s)/side effects and non-pharmacologic comfort measures 06/22/2018 1002 by Rance Muir, RN Outcome: Progressing 06/22/2018 0745 by Rance Muir, RN Outcome: Progressing   Problem: Health Behavior/Discharge Planning: Goal: Ability to manage health-related needs will improve 06/22/2018 1002 by Rance Muir, RN Outcome: Progressing 06/22/2018 0745 by Rance Muir, RN Outcome: Progressing   Problem: Clinical Measurements: Goal: Ability to maintain clinical measurements within normal limits will improve 06/22/2018 1002 by Rance Muir, RN Outcome: Progressing 06/22/2018 0745 by Rance Muir, RN Outcome: Progressing Goal: Will remain free from infection 06/22/2018 1002 by Rance Muir, RN Outcome: Progressing 06/22/2018 0745 by Rance Muir, RN Outcome: Progressing Goal: Diagnostic test results will improve 06/22/2018 1002 by Rance Muir, RN Outcome: Progressing 06/22/2018 0745 by Rance Muir, RN Outcome: Progressing Goal: Respiratory complications will improve 06/22/2018 1002 by Rance Muir, RN Outcome: Progressing 06/22/2018 0745 by Rance Muir, RN Outcome: Progressing Goal: Cardiovascular complication will be avoided 06/22/2018 1002 by Rance Muir, RN Outcome: Progressing 06/22/2018 0745 by Rance Muir, RN Outcome: Progressing   Problem: Activity: Goal: Risk for activity intolerance will decrease 06/22/2018 1002 by Rance Muir, RN Outcome: Progressing 06/22/2018 0745 by Rance Muir, RN Outcome: Progressing   Problem: Nutrition: Goal: Adequate nutrition will be maintained 06/22/2018 1002 by Rance Muir, RN Outcome: Progressing 06/22/2018 0745 by Rance Muir, RN Outcome: Progressing   Problem: Coping: Goal: Level of anxiety will decrease 06/22/2018 1002 by Rance Muir, RN Outcome: Progressing 06/22/2018 0745 by Rance Muir, RN Outcome: Progressing   Problem:  Elimination: Goal: Will not experience complications related to bowel motility 06/22/2018 1002 by Rance Muir, RN Outcome: Progressing 06/22/2018 0745 by Rance Muir, RN Outcome: Progressing Goal: Will not experience complications related to urinary retention 06/22/2018 1002 by Rance Muir, RN Outcome: Progressing 06/22/2018 0745 by Rance Muir, RN Outcome: Progressing   Problem: Pain Managment: Goal: General experience of comfort will improve 06/22/2018 1002 by Rance Muir, RN Outcome: Progressing 06/22/2018 0745 by Rance Muir, RN Outcome: Progressing   Problem: Safety: Goal: Ability to remain free from injury will improve 06/22/2018 1002 by Rance Muir, RN Outcome: Progressing 06/22/2018 0745 by Rance Muir, RN Outcome: Progressing   Problem: Skin Integrity: Goal: Risk for impaired skin integrity will decrease 06/22/2018 1002 by Rance Muir, RN Outcome: Progressing 06/22/2018 0745 by Rance Muir, RN Outcome: Progressing

## 2018-06-22 NOTE — Plan of Care (Signed)

## 2018-06-22 NOTE — Progress Notes (Signed)
Ambulating O2 sat obtained at this time. Patient on room air walking in hallway has sat of 75-86%.  Patient settled back into bed with 3L O2 sating 93%.

## 2018-06-23 ENCOUNTER — Observation Stay (HOSPITAL_COMMUNITY): Payer: Medicare HMO

## 2018-06-23 DIAGNOSIS — M170 Bilateral primary osteoarthritis of knee: Secondary | ICD-10-CM | POA: Diagnosis present

## 2018-06-23 DIAGNOSIS — M479 Spondylosis, unspecified: Secondary | ICD-10-CM | POA: Diagnosis present

## 2018-06-23 DIAGNOSIS — I252 Old myocardial infarction: Secondary | ICD-10-CM | POA: Diagnosis not present

## 2018-06-23 DIAGNOSIS — R197 Diarrhea, unspecified: Secondary | ICD-10-CM | POA: Diagnosis present

## 2018-06-23 DIAGNOSIS — I5043 Acute on chronic combined systolic (congestive) and diastolic (congestive) heart failure: Secondary | ICD-10-CM | POA: Diagnosis present

## 2018-06-23 DIAGNOSIS — J449 Chronic obstructive pulmonary disease, unspecified: Secondary | ICD-10-CM | POA: Diagnosis present

## 2018-06-23 DIAGNOSIS — F17210 Nicotine dependence, cigarettes, uncomplicated: Secondary | ICD-10-CM | POA: Diagnosis present

## 2018-06-23 DIAGNOSIS — J90 Pleural effusion, not elsewhere classified: Secondary | ICD-10-CM | POA: Diagnosis present

## 2018-06-23 DIAGNOSIS — R112 Nausea with vomiting, unspecified: Secondary | ICD-10-CM | POA: Diagnosis not present

## 2018-06-23 DIAGNOSIS — Z8673 Personal history of transient ischemic attack (TIA), and cerebral infarction without residual deficits: Secondary | ICD-10-CM | POA: Diagnosis not present

## 2018-06-23 DIAGNOSIS — I25119 Atherosclerotic heart disease of native coronary artery with unspecified angina pectoris: Secondary | ICD-10-CM | POA: Diagnosis present

## 2018-06-23 DIAGNOSIS — K219 Gastro-esophageal reflux disease without esophagitis: Secondary | ICD-10-CM | POA: Diagnosis present

## 2018-06-23 DIAGNOSIS — I255 Ischemic cardiomyopathy: Secondary | ICD-10-CM | POA: Diagnosis present

## 2018-06-23 DIAGNOSIS — G8929 Other chronic pain: Secondary | ICD-10-CM | POA: Diagnosis present

## 2018-06-23 DIAGNOSIS — F329 Major depressive disorder, single episode, unspecified: Secondary | ICD-10-CM | POA: Diagnosis present

## 2018-06-23 DIAGNOSIS — I509 Heart failure, unspecified: Secondary | ICD-10-CM | POA: Diagnosis not present

## 2018-06-23 DIAGNOSIS — G4733 Obstructive sleep apnea (adult) (pediatric): Secondary | ICD-10-CM | POA: Diagnosis present

## 2018-06-23 DIAGNOSIS — K6389 Other specified diseases of intestine: Secondary | ICD-10-CM | POA: Diagnosis not present

## 2018-06-23 DIAGNOSIS — J9601 Acute respiratory failure with hypoxia: Secondary | ICD-10-CM | POA: Diagnosis present

## 2018-06-23 DIAGNOSIS — E785 Hyperlipidemia, unspecified: Secondary | ICD-10-CM | POA: Diagnosis present

## 2018-06-23 DIAGNOSIS — I679 Cerebrovascular disease, unspecified: Secondary | ICD-10-CM | POA: Diagnosis present

## 2018-06-23 DIAGNOSIS — Z9981 Dependence on supplemental oxygen: Secondary | ICD-10-CM | POA: Diagnosis not present

## 2018-06-23 DIAGNOSIS — I340 Nonrheumatic mitral (valve) insufficiency: Secondary | ICD-10-CM | POA: Diagnosis present

## 2018-06-23 DIAGNOSIS — Z95810 Presence of automatic (implantable) cardiac defibrillator: Secondary | ICD-10-CM | POA: Diagnosis not present

## 2018-06-23 DIAGNOSIS — Z951 Presence of aortocoronary bypass graft: Secondary | ICD-10-CM | POA: Diagnosis not present

## 2018-06-23 DIAGNOSIS — Z9181 History of falling: Secondary | ICD-10-CM | POA: Diagnosis not present

## 2018-06-23 DIAGNOSIS — R0602 Shortness of breath: Secondary | ICD-10-CM | POA: Diagnosis present

## 2018-06-23 LAB — BASIC METABOLIC PANEL
Anion gap: 13 (ref 5–15)
BUN: 19 mg/dL (ref 8–23)
CALCIUM: 8.8 mg/dL — AB (ref 8.9–10.3)
CO2: 25 mmol/L (ref 22–32)
CREATININE: 1.22 mg/dL (ref 0.61–1.24)
Chloride: 99 mmol/L (ref 98–111)
GFR calc Af Amer: >60 mL/min (ref >60)
GFR calc non Af Amer: >60 mL/min (ref >60)
Glucose, Bld: 144 mg/dL — ABNORMAL HIGH (ref 70–99)
Potassium: 3.5 mmol/L (ref 3.5–5.1)
Sodium: 137 mmol/L (ref 135–145)

## 2018-06-23 LAB — CBC
HCT: 44 % (ref 39.0–52.0)
Hemoglobin: 15.1 g/dL (ref 13.0–17.0)
MCH: 31.2 pg (ref 26.0–34.0)
MCHC: 34.3 g/dL (ref 30.0–36.0)
MCV: 90.9 fL (ref 80.0–100.0)
PLATELETS: 238 10*3/uL (ref 150–400)
RBC: 4.84 MIL/uL (ref 4.22–5.81)
RDW: 14.7 % (ref 11.5–15.5)
WBC: 11.5 10*3/uL — ABNORMAL HIGH (ref 4.0–10.5)
nRBC: 0 % (ref 0.0–0.2)

## 2018-06-23 LAB — HEPATIC FUNCTION PANEL
ALT: 20 U/L (ref 0–44)
AST: 19 U/L (ref 15–41)
Albumin: 4 g/dL (ref 3.5–5.0)
Alkaline Phosphatase: 62 U/L (ref 38–126)
Bilirubin, Direct: 0.2 mg/dL (ref 0.0–0.2)
Indirect Bilirubin: 0.7 mg/dL (ref 0.3–0.9)
TOTAL PROTEIN: 7.8 g/dL (ref 6.5–8.1)
Total Bilirubin: 0.9 mg/dL (ref 0.3–1.2)

## 2018-06-23 LAB — LIPASE, BLOOD: Lipase: 30 U/L (ref 11–51)

## 2018-06-23 LAB — HIV ANTIBODY (ROUTINE TESTING W REFLEX): HIV Screen 4th Generation wRfx: NONREACTIVE

## 2018-06-23 LAB — TROPONIN I

## 2018-06-23 MED ORDER — ONDANSETRON HCL 4 MG/2ML IJ SOLN
4.0000 mg | Freq: Four times a day (QID) | INTRAMUSCULAR | Status: DC | PRN
  Administered 2018-06-23 (×2): 4 mg via INTRAVENOUS
  Filled 2018-06-23: qty 2

## 2018-06-23 MED ORDER — ONDANSETRON HCL 4 MG/2ML IJ SOLN
INTRAMUSCULAR | Status: AC
  Filled 2018-06-23: qty 2

## 2018-06-23 NOTE — Plan of Care (Signed)

## 2018-06-23 NOTE — Progress Notes (Signed)
Patient ID: Charles Savage, male   DOB: 01-28-1956, 62 y.o.   MRN: [id number]  PROGRESS NOTE    Charles Savage  NOI:[id number] DOB: Aug 13, 1955 DOA: 06/21/2018 PCP: Charles Sites, MD   Brief Narrative:   Charles Savage a 62 y.o.malewith medical history significant ofCVA, CHFs/pIVCD, COPD, CAD, hyperlipidemia presenting to the hospital for evaluation of shortness of breath.Patient reports having dyspnea on exertion since Thanksgiving which has been getting progressively worse. Denies having any orthopnea, paroxysmal nocturnal dyspnea, or lower extremity edema. States his cardiologist had stopped Lasix and spironolactone in April as he was not having any swelling in his legs. Denies increased dietary sodium or fluid intake. He does not use home oxygen. Reports having intermittent chest heaviness since Thanksgiving. Last episode of chest heaviness was on Christmas Day when he also felt like both of his legs were weak and giving out. States he has been having weakness in his legs previously as well and his cardiologist had done some tests. He was told that he did not have any blood clots in his legs or any blockages. States he also had a stress test in November.   Assessment & Plan:   Principal Problem:   Acute exacerbation of CHF (congestive heart failure) (HCC) Active Problems:   Nausea & vomiting   CAD (coronary artery disease)   Loculated pleural effusion   Angina pectoris (HCC)   Physical deconditioning   OSA (obstructive sleep apnea)   Chronic pain   Acute hypoxic respiratory failure secondary to exacerbation ofchronic systolic and diastoliccongestive heart failure History of ischemic cardiomyopathy. Per cardiology documentation, EF previously 15 to 20% in 2013. He is status post ICD placement and is followed by Charles Savage. EF improved to 55 to 60% on most recent echo done in 2017. He is currently not taking a diuretic. Presenting with progressive dyspnea on  exertion since Thanksgiving.Oxygen saturationas low as 77%on room airper ED provider. BNP 601, no previous baseline. D-dimer 1.38. CT angiogram of chest negative for acute PE;findings are most consistent with mild CHF. -Received IV Lasix 40 mg in the ED.  continue Lasix 40 mg IV every 12 hours.  Patient continued to be hypoxic especially with ambulation dropped to 78% on room air. -Monitor intake and output -Daily weights -Low-sodium diet with fluid restriction -Continue to monitor renal function with IV diuresis -Supplemental oxygen -Wife reports she thinks he has been chronically hypoxic at home on occasion for over 2 months but this is never been evaluated or documented  New nausea vomiting and diarrhea Patient has developed nausea vomiting and diarrhea without any abdominal pain since this morning.  Abdominal exam is benign.  Check KUB.  Check LFTs and lipase.  Also check a troponin.  Patient reports he is not getting his Norco that is ordered.  Query whether or not this is some mild opiate withdrawal.  Loculated pleural effusion White count 10.9.Patient is afebrile and nontoxic-appearing. CTA showingslight interval enlargement of a small loculated right lateral pleural effusion.Pleural effusion seen on CT chest done in December 2018 as well. I discussed the patient's case and imaging with Dr. Anselm Savage from interventional radiology at [facility] [place]. His recommendation is to get an ultrasound of the chest to assess the size and complexity of the loculated pleural effusion. Stated he is on call this weekend and can be called tomorrow after the ultrasound to discuss the results. Patient may need outpatient thoracentesis by CT surgery depending on what the ultrasound shows. -Ultrasound of chest has been ordered.  Please discuss findings with Dr. Anselm Savage. -Ultrasound chest still pending  history of CAD Serial enzymes negative.  Cardiology follow-up as an outpatient.  Physical  deconditioning,bilateral leg weakness Bilateral lower extremity strength 5 out of 5 on exam. No focal neuro deficit appreciated. -PT evaluation  OSA -Continue CPAP at night  Chronic pain -Continue home Celebrex, Norcoprn  GERD -Continue PPI    DVT prophylaxis: Heparin Code Status: Full  family Communication: (Wife Disposition Plan: When hypoxia resolves    Subjective: Complaining of nausea vomiting and diarrhea that started last night no abdominal pain no chest pain  Objective: Vitals:   06/22/18 1442 06/22/18 2123 06/22/18 2322 06/23/18 0511  BP: 101/67 103/60  (!) 115/97  Pulse: (!) 58 (!) 58 (!) 57 82  Resp: (!) 22 20 20  (!) 22  Temp: (!) 97.3 F (36.3 C) (!) 97.4 F (36.3 C)  97.6 F (36.4 C)  TempSrc: Oral Oral  Oral  SpO2: 98% 96% 94% 95%  Weight:      Height:        Intake/Output Summary (Last 24 hours) at 06/23/2018 0946 Last data filed at 06/23/2018 0750 Gross per 24 hour  Intake 720 ml  Output 1250 ml  Net -530 ml   Filed Weights   06/21/18 1130 06/21/18 2041  Weight: 94.8 kg 94.2 kg    Examination:  General exam: Appears calm and comfortable  Respiratory system: Clear to auscultation. Respiratory effort normal. Cardiovascular system: S1 & S2 heard, RRR. No JVD, murmurs, rubs, gallops or clicks. No pedal edema. Gastrointestinal system: Abdomen is nondistended, soft and nontender. No organomegaly or masses felt. Normal bowel sounds heard. Central nervous system: Alert and oriented. No focal neurological deficits. Extremities: Symmetric 5 x 5 power. Skin: No rashes, lesions or ulcers Psychiatry: Judgement and insight appear normal. Mood & affect appropriate.     Data Reviewed: I have personally reviewed following labs and imaging studies  CBC: Recent Labs  Lab 06/21/18 1158 06/22/18 0702 06/23/18 0454  WBC 10.9* 9.0 11.5*  NEUTROABS 7.7  --   --   HGB 13.6 14.3 15.1  HCT 41.3 42.6 44.0  MCV 92.8 92.2 90.9  PLT 224 234 238     Basic Metabolic Panel: Recent Labs  Lab 06/21/18 1231 06/22/18 0702 06/23/18 0454  NA 139 139 137  K 4.3 3.6 3.5  CL 107 104 99  CO2 26 26 25   GLUCOSE 188* 149* 144*  BUN 19 15 19   CREATININE 1.15 1.05 1.22  CALCIUM 8.5* 8.6* 8.8*   GFR: Estimated Creatinine Clearance: 72.7 mL/min (by C-G formula based on SCr of 1.22 mg/dL). Liver Function Tests: Recent Labs  Lab 06/21/18 1231  AST 18  ALT 18  ALKPHOS 53  BILITOT 0.8  PROT 6.9  ALBUMIN 3.6   No results for input(s): LIPASE, AMYLASE in the last 168 hours. No results for input(s): AMMONIA in the last 168 hours. Coagulation Profile: No results for input(s): INR, PROTIME in the last 168 hours. Cardiac Enzymes: Recent Labs  Lab 06/21/18 1231  TROPONINI <0.03   BNP (last 3 results) No results for input(s): PROBNP in the last 8760 hours. HbA1C: No results for input(s): HGBA1C in the last 72 hours. CBG: No results for input(s): GLUCAP in the last 168 hours. Lipid Profile: No results for input(s): CHOL, HDL, LDLCALC, TRIG, CHOLHDL, LDLDIRECT in the last 72 hours. Thyroid Function Tests: No results for input(s): TSH, T4TOTAL, FREET4, T3FREE, THYROIDAB in the last 72 hours. Anemia Panel: No results for  input(s): VITAMINB12, FOLATE, FERRITIN, TIBC, IRON, RETICCTPCT in the last 72 hours. Sepsis Labs: No results for input(s): PROCALCITON, LATICACIDVEN in the last 168 hours.  No results found for this or any previous visit (from the past 240 hour(s)).       Radiology Studies: Dg Chest 2 View  Result Date: 06/21/2018 CLINICAL DATA:  Family reports pt with SOB since thanksgiving and increase SOB on Christmas day. Pt reports weakness and falling on Wednesday. Hx pneumonia/hx lung cancer/copd/chf/smoker/ hx heart cath and defibrillator placement EXAM: CHEST - 2 VIEW COMPARISON:  05/10/2018 FINDINGS: The patient has had median sternotomy and CABG. LEFT-sided electronic cardiac device lead overlies the RIGHT ventricle.  Heart size is normal. The coarse patchy densities identified in the lung bases bilaterally, similar in appearance to previous study. Bilateral pleural thickening. Emphysematous changes. IMPRESSION: Stable appearance of bilateral LOWER lobe opacities. Electronically Signed   By: Nolon Nations M.D.   On: 06/21/2018 12:58   Ct Angio Chest Pe W/cm &/or Wo Cm  Result Date: 06/21/2018 CLINICAL DATA:  62 year old male with shortness of breath since 05/23/2018 but worse the past 3 days. EXAM: CT ANGIOGRAPHY CHEST WITH CONTRAST TECHNIQUE: Multidetector CT imaging of the chest was performed using the standard protocol during bolus administration of intravenous contrast. Multiplanar CT image reconstructions and MIPs were obtained to evaluate the vascular anatomy. CONTRAST:  17mL ISOVUE-370 IOPAMIDOL (ISOVUE-370) INJECTION 76% COMPARISON:  Prior CT scan of the chest 11/28/2017 FINDINGS: Cardiovascular: Excellent opacification of the pulmonary arteries to the proximal subsegmental level. No evidence of central filling defect to suggest acute pulmonary embolus. The main pulmonary artery is mildly enlarged at 3 cm in diameter. Conventional 3 vessel arch anatomy. Surgical changes of prior multivessel CABG. Cardiomegaly. Left subclavian approach cardiac rhythm maintenance device with a single lead terminating in the right ventricular apex. Surgical changes of prior mitral valve annuloplasty. No pericardial effusion. Mediastinum/Nodes: Unremarkable thyroid gland. Progressive multifocal mediastinal and hilar adenopathy. An index right high paratracheal lymph node now measures 1.2 cm in short axis on image 21 of series 6 compared to 1.0 cm on 11/28/2017. Additional comparison C include a now 1.1 cm high right paratracheal node (image 23, series 6) compared to 0.7 cm previously, 1.3 cm left hilar lymph node (image 38, series 6) compared to 1.0 cm, and a right infrahilar lymph node measuring 1.4 cm (image 55, series 6) compared  to 0.7 cm previously. Unremarkable thoracic esophagus. Lungs/Pleura: Combined centrilobular and paraseptal pulmonary emphysema. Mild interseptal thickening and diffuse mild ground-glass attenuation airspace opacity both increased compared to prior and suggesting mild interstitial pulmonary edema. 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 Chronic consolidation/scarring present in the posterior right lower lobe without interval change. Small loculated right lateral pleural effusion minimally enlarged compared to prior. Upper Abdomen: Tiny punctate calcifications scattered throughout the liver consistent with old granulomatous disease. No acute abnormality within the visualized upper abdomen. Musculoskeletal: No acute fracture or aggressive appearing lytic or blastic osseous lesion. Review of the MIP images confirms the above findings. IMPRESSION: 1. Negative for acute pulmonary embolus. 2. CT findings are most consistent with mild CHF. 3. Slight interval progression of multifocal mediastinal lymphadenopathy most likely reflects congestion in the setting of acute exacerbation of chronic CHF. 4. 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 Emphysema (ICD10-J43.9). 5. Enlarged  main pulmonary artery as can be seen in the setting of pulmonary arterial hypertension. 6. Coronary artery calcifications status post CABG. 7. Cardiomegaly with automated intraventricular defibrillator in place. 8. Slight interval enlargement of loculated right lateral pleural effusion. 9. Chronic scarring/post radiation changes in the posterior right lower lobe, unchanged. Aortic Atherosclerosis (ICD10-I70.0). Electronically Signed   By: Jacqulynn Cadet M.D.    On: 06/21/2018 14:13        Scheduled Meds: . aspirin EC  81 mg Oral Daily  . atorvastatin  80 mg Oral QPM  . carvedilol  12.5 mg Oral q morning - 10a  . carvedilol  25 mg Oral QHS  . celecoxib  200 mg Oral Daily  . escitalopram  20 mg Oral Daily  . furosemide  40 mg Intravenous Q12H  . heparin  5,000 Units Subcutaneous Q8H  . isosorbide mononitrate  15 mg Oral Daily  . pantoprazole  40 mg Oral QAC breakfast   Continuous Infusions:   LOS: 0 days    Time spent: 25 minutes    Jalien Weakland A, MD [facility] Pager [phone]  If 7PM-7AM, please contact night-coverage [email or web address] Password [facility] 06/23/2018, 9:46 AM

## 2018-06-24 ENCOUNTER — Inpatient Hospital Stay (HOSPITAL_COMMUNITY): Payer: Medicare HMO

## 2018-06-24 ENCOUNTER — Other Ambulatory Visit: Payer: Self-pay

## 2018-06-24 LAB — CBC
HCT: 45.9 % (ref 39.0–52.0)
Hemoglobin: 15.4 g/dL (ref 13.0–17.0)
MCH: 31.2 pg (ref 26.0–34.0)
MCHC: 33.6 g/dL (ref 30.0–36.0)
MCV: 93.1 fL (ref 80.0–100.0)
PLATELETS: 239 10*3/uL (ref 150–400)
RBC: 4.93 MIL/uL (ref 4.22–5.81)
RDW: 14.9 % (ref 11.5–15.5)
WBC: 7.5 10*3/uL (ref 4.0–10.5)
nRBC: 0 % (ref 0.0–0.2)

## 2018-06-24 LAB — BASIC METABOLIC PANEL
Anion gap: 11 (ref 5–15)
BUN: 31 mg/dL — ABNORMAL HIGH (ref 8–23)
CO2: 29 mmol/L (ref 22–32)
Calcium: 8.1 mg/dL — ABNORMAL LOW (ref 8.9–10.3)
Chloride: 95 mmol/L — ABNORMAL LOW (ref 98–111)
Creatinine, Ser: 1.56 mg/dL — ABNORMAL HIGH (ref 0.61–1.24)
GFR calc Af Amer: 54 mL/min — ABNORMAL LOW (ref >60)
GFR calc non Af Amer: 47 mL/min — ABNORMAL LOW (ref >60)
Glucose, Bld: 130 mg/dL — ABNORMAL HIGH (ref 70–99)
Potassium: 3.5 mmol/L (ref 3.5–5.1)
Sodium: 135 mmol/L (ref 135–145)

## 2018-06-24 MED ORDER — FUROSEMIDE 10 MG/ML IJ SOLN
40.0000 mg | Freq: Every day | INTRAMUSCULAR | Status: DC
  Administered 2018-06-24: 40 mg via INTRAVENOUS

## 2018-06-24 MED ORDER — FUROSEMIDE 20 MG PO TABS: 10.0000 mg | ORAL_TABLET | Freq: Every day | ORAL | 0 refills | Status: DC

## 2018-06-24 MED ORDER — ISOSORBIDE MONONITRATE ER 30 MG PO TB24: 15.0000 mg | ORAL_TABLET | Freq: Every day | ORAL | 3 refills | Status: DC

## 2018-06-24 NOTE — Telephone Encounter (Signed)
Refilled imdur per fax request from [facility]

## 2018-06-24 NOTE — Progress Notes (Addendum)
SATURATION QUALIFICATIONS: (This note is used to comply with regulatory documentation for home oxygen)  Patient Saturations on Room Air at Rest = 83 %  Patient saturations on 2 L during ambulation= 95%   Please briefly explain why patient needs home oxygen:

## 2018-06-24 NOTE — Progress Notes (Signed)
[name] IV catheter removed and intact. IV site clean dry and intact. Discharge instructions including medications and follow up appointments were reviewed and discussed with patient's wife Pam Kouns. All questions were answered and no further questions at this time. Pt in stable condition and in no acute distress at time of discharge. Pt will be escorted by nurse tech.

## 2018-06-24 NOTE — Care Management Note (Signed)
Case Management Note  Patient Details  Name: Charles Savage MRN: [id number] Date of Birth: 1956/01/18  Subjective/Objective:        Admitted with CHF. Pt from home, lives with wife, ind with ADL's, drives, has insurance and PCP. Pt uses CPAP at night, provided by [facility]. Pt is compliant with diet and medication regimen. Will need to DC home with oxygen. Pt requests [facility], CMS provider list given also.             Action/Plan: DC home today with self care and home O2. Blake Divine, [facility] rep, given referral and port O2 tank will be delivered to pt room prior to DC.   Expected Discharge Date:  06/24/18               Expected Discharge Plan:  Home/Self Care  In-House Referral:  NA  Discharge planning Services  CM Consult  Post Acute Care Choice:  Durable Medical Equipment Choice offered to:  Patient, Spouse  DME Arranged:  Oxygen DME Agency:  [place].  Status of Service:  Completed, signed off  Sherald Barge, RN 06/24/2018, 12:37 PM

## 2018-06-24 NOTE — Discharge Summary (Addendum)
Physician Discharge Summary  Charles Savage:[id number] DOB: 06-08-1956 DOA: 06/21/2018  PCP: Charles Sites, MD  Admit date: 06/21/2018 Discharge date: 06/24/2018  Time spent: 35 minutes  Recommendations for Outpatient Follow-up:  1. PCP x1 week    Discharge Diagnoses:  Principal Problem:   Acute exacerbation of CHF (congestive heart failure) (HCC) Active Problems:   Nausea & vomiting   CAD (coronary artery disease)   Loculated pleural effusion   Angina pectoris (HCC)   Physical deconditioning   OSA (obstructive sleep apnea)   Chronic pain   CHF (congestive heart failure) (Learned)   Discharge Condition: Stable and improved  Diet recommendation: Cardiac low salt  Filed Weights   06/21/18 1130 06/21/18 2041 06/24/18 0500  Weight: 94.8 kg 94.2 kg 91.6 kg    History of present illness:  Charles Savage a 62 y.o.malewith medical history significant ofCVA, CHFs/pIVCD, COPD, CAD, hyperlipidemia presenting to the hospital for evaluation of shortness of breath.Patient reports having dyspnea on exertion since Thanksgiving which has been getting progressively worse. Denies having any orthopnea, paroxysmal nocturnal dyspnea, or lower extremity edema. States his cardiologist had stopped Lasix and spironolactone in April as he was not having any swelling in his legs. Denies increased dietary sodium or fluid intake. He does not use home oxygen. Reports having intermittent chest heaviness since Thanksgiving. Last episode of chest heaviness was on Christmas Day when he also felt like both of his legs were weak and giving out. States he has been having weakness in his legs previously as well and his cardiologist had done some tests. He was told that he did not have any blood clots in his legs or any blockages. States he also had a stress test in November.  Hospital Course:  Acute hypoxic respiratory failure secondary to exacerbation ofchronic systolic and  diastoliccongestive heart failure History of ischemic cardiomyopathy. Per cardiology documentation, EF previously 15 to 20% in 2013. He is status post ICD placement and is followed by Dr. Lovena Le. EF improved to 55 to 60% on most recent echo done in 2017. He is currently not taking a diuretic. Presenting with progressive dyspnea on exertion since Thanksgiving.Oxygen saturationas low as 77%on room airper ED provider. BNP 601, no previous baseline. D-dimer 1.38. CT angiogram of chest negative for acute PE;findings are most consistent with mild CHF. -Received  Lasix 40 mg IV every 12 hours. Patient continued to be hypoxic especially with ambulation dropped to 78% on room air. -Wife reports she thinks he has been chronically hypoxic at home on occasion for over 2 months but this is never been evaluated or documented.  Patient will be discharged home on higher dose and home Lasix doses previously to 20 mg daily.  He was previously taken as needed a couple times a week.  He is also being discharged on supplemental oxygen as needed.  This can be weaned as an outpatient.  Patient is being discharged home in stable and improved condition back to his baseline status.  New nausea vomiting and diarrhea Work-up was negative for this.  This quickly resolved in less than 24 hours.  Loculated pleural effusion chronic White count 10.9.Patient is afebrile and nontoxic-appearing.  Ultrasound ordered and never was done due to unavailability in the hospital.  Patient was afebrile with normal white count did not show any signs of infection.  Recommend follow-up as an outpatient for the small loculated pleural effusion.  history of CAD Serial enzymes negative.    OSA -Continue CPAP at night  Chronic  pain -Continue home Celebrex, Norcoprn  GERD -Continue PPI   Discharge Exam: Vitals:   06/24/18 0629 06/24/18 1029  BP: 90/63 113/65  Pulse: 74 72  Resp: 18   Temp: (!) 97.5 F (36.4 C)    SpO2: 91%     General: Alert and oriented no apparent distress Cardiovascular: Regular rate and rhythm without murmurs rubs or gallops Respiratory: Clear to auscultation bilateral no wheezes rhonchi rales  Discharge Instructions   Discharge Instructions    Diet - low sodium heart healthy   Complete by:  As directed    Increase activity slowly   Complete by:  As directed       Allergies  Allergen Reactions  . Metoprolol Shortness Of Breath and Nausea And Vomiting  . Peach Flavor Hives, Itching and Other (See Comments)    "peaches; peach flavoring; actually happened after he ate a store-bought peach pie"  . Morphine And Related Hives and Itching  . Bee Venom Hives and Swelling  . Lisinopril Cough   Follow-up Information    Charles Sites, MD Follow up in 1 week(s).   Specialty:  Family Medicine Contact information: [place] [address] [phone]        Charles Lenis, MD .   Specialty:  Cardiology Contact information: [address] [place] [address] [phone]            The results of significant diagnostics from this hospitalization (including imaging, microbiology, ancillary and laboratory) are listed below for reference.    Significant Diagnostic Studies: Dg Chest 2 View  Result Date: 06/21/2018 CLINICAL DATA:  Family reports pt with SOB since thanksgiving and increase SOB on Christmas day. Pt reports weakness and falling on Wednesday. Hx pneumonia/hx lung cancer/copd/chf/smoker/ hx heart cath and defibrillator placement EXAM: CHEST - 2 VIEW COMPARISON:  05/10/2018 FINDINGS: The patient has had median sternotomy and CABG. LEFT-sided electronic cardiac device lead overlies the RIGHT ventricle. Heart size is normal. The coarse patchy densities identified in the lung bases bilaterally, similar in appearance to previous study. Bilateral pleural thickening. Emphysematous changes. IMPRESSION: Stable appearance of bilateral  LOWER lobe opacities. Electronically Signed   By: Nolon Nations M.D.   On: 06/21/2018 12:58   Dg Abd 1 View  Result Date: 06/23/2018 CLINICAL DATA:  Nausea and vomiting starting this am EXAM: ABDOMEN - 1 VIEW COMPARISON:  PET-CT 10/13/2016, CT of the abdomen on 07/25/2013 and 04/28/2011 FINDINGS: There is paucity of bowel gas, limiting evaluation of bowel loops. No evidence for free intraperitoneal air. No abnormal calcifications or evidence for organomegaly. Remote lumbosacral re-stage fusion. IMPRESSION: Paucity of bowel gas, limiting evaluation of bowel loops. Electronically Signed   By: Nolon Nations M.D.   On: 06/23/2018 11:38   Ct Angio Chest Pe W/cm &/or Wo Cm  Result Date: 06/21/2018 CLINICAL DATA:  62 year old male with shortness of breath since 05/23/2018 but worse the past 3 days. EXAM: CT ANGIOGRAPHY CHEST WITH CONTRAST TECHNIQUE: Multidetector CT imaging of the chest was performed using the standard protocol during bolus administration of intravenous contrast. Multiplanar CT image reconstructions and MIPs were obtained to evaluate the vascular anatomy. CONTRAST:  125mL ISOVUE-370 IOPAMIDOL (ISOVUE-370) INJECTION 76% COMPARISON:  Prior CT scan of the chest 11/28/2017 FINDINGS: Cardiovascular: Excellent opacification of the pulmonary arteries to the proximal subsegmental level. No evidence of central filling defect to suggest acute pulmonary embolus. The main pulmonary artery is mildly enlarged at 3 cm in diameter. Conventional 3 vessel arch anatomy. Surgical changes  of prior multivessel CABG. Cardiomegaly. Left subclavian approach cardiac rhythm maintenance device with a single lead terminating in the right ventricular apex. Surgical changes of prior mitral valve annuloplasty. No pericardial effusion. Mediastinum/Nodes: Unremarkable thyroid gland. Progressive multifocal mediastinal and hilar adenopathy. An index right high paratracheal lymph node now measures 1.2 cm in short axis on image  21 of series 6 compared to 1.0 cm on 11/28/2017. Additional comparison C include a now 1.1 cm high right paratracheal node (image 23, series 6) compared to 0.7 cm previously, 1.3 cm left hilar lymph node (image 38, series 6) compared to 1.0 cm, and a right infrahilar lymph node measuring 1.4 cm (image 55, series 6) compared to 0.7 cm previously. Unremarkable thoracic esophagus. Lungs/Pleura: Combined centrilobular and paraseptal pulmonary emphysema. Mild interseptal thickening and diffuse mild ground-glass attenuation airspace opacity both increased compared to prior and suggesting mild interstitial pulmonary edema. 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 Chronic consolidation/scarring present in the posterior right lower lobe without interval change. Small loculated right lateral pleural effusion minimally enlarged compared to prior. Upper Abdomen: Tiny punctate calcifications scattered throughout the liver consistent with old granulomatous disease. No acute abnormality within the visualized upper abdomen. Musculoskeletal: No acute fracture or aggressive appearing lytic or blastic osseous lesion. Review of the MIP images confirms the above findings. IMPRESSION: 1. Negative for acute pulmonary embolus. 2. CT findings are most consistent with mild CHF. 3. Slight interval progression of multifocal mediastinal lymphadenopathy most likely reflects congestion in the setting of acute exacerbation of chronic CHF. 4. 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 Emphysema (ICD10-J43.9). 5. Enlarged main pulmonary artery as can be seen in the setting of pulmonary arterial hypertension. 6. Coronary artery calcifications status post CABG.  7. Cardiomegaly with automated intraventricular defibrillator in place. 8. Slight interval enlargement of loculated right lateral pleural effusion. 9. Chronic scarring/post radiation changes in the posterior right lower lobe, unchanged. Aortic Atherosclerosis (ICD10-I70.0). Electronically Signed   By: Jacqulynn Cadet M.D.   On: 06/21/2018 14:13    Microbiology: No results found for this or any previous visit (from the past 240 hour(s)).   Labs: Basic Metabolic Panel: Recent Labs  Lab 06/21/18 1231 06/22/18 0702 06/23/18 0454 06/24/18 0549  NA 139 139 137 135  K 4.3 3.6 3.5 3.5  CL 107 104 99 95*  CO2 26 26 25 29   GLUCOSE 188* 149* 144* 130*  BUN 19 15 19  31*  CREATININE 1.15 1.05 1.22 1.56*  CALCIUM 8.5* 8.6* 8.8* 8.1*   Liver Function Tests: Recent Labs  Lab 06/21/18 1231 06/23/18 0454  AST 18 19  ALT 18 20  ALKPHOS 53 62  BILITOT 0.8 0.9  PROT 6.9 7.8  ALBUMIN 3.6 4.0   Recent Labs  Lab 06/23/18 0454  LIPASE 30   No results for input(s): AMMONIA in the last 168 hours. CBC: Recent Labs  Lab 06/21/18 1158 06/22/18 0702 06/23/18 0454 06/24/18 0549  WBC 10.9* 9.0 11.5* 7.5  NEUTROABS 7.7  --   --   --   HGB 13.6 14.3 15.1 15.4  HCT 41.3 42.6 44.0 45.9  MCV 92.8 92.2 90.9 93.1  PLT 224 234 238 239   Cardiac Enzymes: Recent Labs  Lab 06/21/18 1231 06/23/18 1017  TROPONINI <0.03 <0.03   BNP: BNP (last 3 results) Recent  Labs    06/21/18 1231  BNP 601.0*    ProBNP (last 3 results) No results for input(s): PROBNP in the last 8760 hours.  CBG: No results for input(s): GLUCAP in the last 168 hours.     Signed:  Phillips Grout MD.  [facility] 06/24/2018, 11:24 AM   There was an attempt to an ultrasound-guided thoracentesis for his loculated pleural effusion yesterday but there was not enough fluid to drain so this was aborted.

## 2018-06-24 NOTE — Progress Notes (Signed)
Per Radiology, patient does not have enough fluid to be removed and procedure has been cancelled.

## 2018-06-25 DIAGNOSIS — R0602 Shortness of breath: Secondary | ICD-10-CM | POA: Diagnosis not present

## 2018-06-25 DIAGNOSIS — J9690 Respiratory failure, unspecified, unspecified whether with hypoxia or hypercapnia: Secondary | ICD-10-CM | POA: Diagnosis not present

## 2018-06-25 DIAGNOSIS — J1520 Pneumonia due to staphylococcus, unspecified: Secondary | ICD-10-CM | POA: Diagnosis not present

## 2018-06-25 DIAGNOSIS — G4733 Obstructive sleep apnea (adult) (pediatric): Secondary | ICD-10-CM | POA: Diagnosis not present

## 2018-06-25 DIAGNOSIS — I509 Heart failure, unspecified: Secondary | ICD-10-CM | POA: Diagnosis not present

## 2018-06-29 DIAGNOSIS — J1520 Pneumonia due to staphylococcus, unspecified: Secondary | ICD-10-CM | POA: Diagnosis not present

## 2018-06-29 DIAGNOSIS — I5022 Chronic systolic (congestive) heart failure: Secondary | ICD-10-CM | POA: Diagnosis not present

## 2018-06-29 DIAGNOSIS — I509 Heart failure, unspecified: Secondary | ICD-10-CM | POA: Diagnosis not present

## 2018-06-29 DIAGNOSIS — G4733 Obstructive sleep apnea (adult) (pediatric): Secondary | ICD-10-CM | POA: Diagnosis not present

## 2018-06-29 DIAGNOSIS — R0602 Shortness of breath: Secondary | ICD-10-CM | POA: Diagnosis not present

## 2018-06-29 DIAGNOSIS — J9690 Respiratory failure, unspecified, unspecified whether with hypoxia or hypercapnia: Secondary | ICD-10-CM | POA: Diagnosis not present

## 2018-07-02 DIAGNOSIS — E6609 Other obesity due to excess calories: Secondary | ICD-10-CM | POA: Diagnosis not present

## 2018-07-02 DIAGNOSIS — I2510 Atherosclerotic heart disease of native coronary artery without angina pectoris: Secondary | ICD-10-CM | POA: Diagnosis not present

## 2018-07-02 DIAGNOSIS — G8929 Other chronic pain: Secondary | ICD-10-CM | POA: Diagnosis not present

## 2018-07-02 DIAGNOSIS — Z6831 Body mass index (BMI) 31.0-31.9, adult: Secondary | ICD-10-CM | POA: Diagnosis not present

## 2018-07-02 DIAGNOSIS — I509 Heart failure, unspecified: Secondary | ICD-10-CM | POA: Diagnosis not present

## 2018-07-02 DIAGNOSIS — J449 Chronic obstructive pulmonary disease, unspecified: Secondary | ICD-10-CM | POA: Diagnosis not present

## 2018-07-02 DIAGNOSIS — E876 Hypokalemia: Secondary | ICD-10-CM | POA: Diagnosis not present

## 2018-07-08 ENCOUNTER — Other Ambulatory Visit: Payer: Self-pay | Admitting: Cardiology

## 2018-07-16 DIAGNOSIS — G4733 Obstructive sleep apnea (adult) (pediatric): Secondary | ICD-10-CM | POA: Diagnosis not present

## 2018-07-16 DIAGNOSIS — R0602 Shortness of breath: Secondary | ICD-10-CM | POA: Diagnosis not present

## 2018-07-16 DIAGNOSIS — J9690 Respiratory failure, unspecified, unspecified whether with hypoxia or hypercapnia: Secondary | ICD-10-CM | POA: Diagnosis not present

## 2018-07-16 DIAGNOSIS — J1520 Pneumonia due to staphylococcus, unspecified: Secondary | ICD-10-CM | POA: Diagnosis not present

## 2018-07-16 DIAGNOSIS — I509 Heart failure, unspecified: Secondary | ICD-10-CM | POA: Diagnosis not present

## 2018-07-21 LAB — CUP PACEART REMOTE DEVICE CHECK
Battery Remaining Longevity: 78 mo
Battery Voltage: 2.99 V
Brady Statistic RV Percent Paced: 0.02 %
Date Time Interrogation Session: 20191216111609
HIGH POWER IMPEDANCE MEASURED VALUE: 82 Ohm
Implantable Lead Implant Date: 20131220
Implantable Lead Model: 6935
Implantable Pulse Generator Implant Date: 20131220
Lead Channel Impedance Value: 399 Ohm
Lead Channel Impedance Value: 456 Ohm
Lead Channel Pacing Threshold Amplitude: 0.75 V
Lead Channel Pacing Threshold Pulse Width: 0.4 ms
Lead Channel Sensing Intrinsic Amplitude: 7.125 mV
Lead Channel Sensing Intrinsic Amplitude: 7.125 mV
Lead Channel Setting Pacing Amplitude: 2 V
Lead Channel Setting Pacing Pulse Width: 0.4 ms
Lead Channel Setting Sensing Sensitivity: 0.3 mV
MDC IDC LEAD LOCATION: 753860

## 2018-07-23 NOTE — Progress Notes (Addendum)
Cardiology Office Note    Date:  07/24/2018   ID:  Charles Savage, DOB Apr 29, 1956, MRN [id number]  PCP:  Sharilyn Sites, MD  Cardiologist: Carlyle Dolly, MD    Chief Complaint  Patient presents with  . Hospitalization Follow-up    History of Present Illness:    Charles Savage is a 63 y.o. male with past medical history of CAD(s/p CABG in 09/2011 with LIMA-LAD, Seq SVG-OM1-OM2-OM3, SVG-PDA, and SVG-RCA), ischemic cardiomyopathy (EF previously 15-20% in 2013, s/p Medtronic ICD placement with repeat echo in 2017 showing EF had improved to 55-60%), severe MR (s/p repair with annuloplasty ring in 09/2011), HTN, HLD, OSA, carotid artery stenosis,CVA,and history of lung cancer who presents to the office today for hospital follow-up.  He was last examined by myself in 05/2018 in regards to follow-up from his recent stress test which had shown scar and mild peri-infarct ischemia. This had been reviewed with Dr. Harl Bowie who recommended medical therapy with consideration of a cardiac catheterization if he developed refractory or progressive symptoms. At the time of his visit, he reported that his dyspnea had significantly improved after being started on Albuterol and Spiriva by his PCP. Denied any associated chest pain. Continued medical management was recommended.  In the interim, he was admitted to [facility] from 06/21/2018 to 06/24/2018 for evaluation of worsening dyspnea on exertion. BNP was found to be elevated to 601 and D-dimer was elevated to 1.38. CTA was negative for an acute PE but did show findings consistent with mild CHF along with chronic changes of pulmonary fibrosis in a pattern most consistent with interstitial pneumonitis and emphysema. He had previously been taking Lasix 10 mg daily as needed and it was recommended he take 10 mg daily at the time of discharge. Creatinine was elevated to 1.56 on discharge (had been 1.15 on admission).   In talking with the patient today, he  reports having been started on 2 L [place] oxygen supplementation at the time of hospital discharge. He does not have a pulse oximeter at home and has been unable to follow this. He reports having baseline dyspnea on exertion but denies any recent orthopnea, PND, or lower extremity edema. Family members note that weight has been stable on his home scales and had been stable leading up to his recent admission.  He does report a tightness along his entire precordium which occurs when climbing stairs and this has been occurring for the past several months per his report. He denies any recent change in his symptoms. Has not noticed any other episodes of chest discomfort when carrying out routine activities but says he has been less active due to the need for oxygen along with chronic joint pain.  Past Medical History:  Diagnosis Date  . 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 Arthritis    "back; knees" (06/14/2012)  . Cerebrovascular disease    h/o CVA in 07/2011 + left carotid endarterectomy  . CHF (congestive heart failure) ([place])   . Cholelithiasis 07/05/2012   Asymptomatic; identified on CT scanning of the chest;  . Chronic lower back pain   . COPD (chronic obstructive pulmonary disease) ([place])    on 2L O2 at home since 08/2011  . Coronary artery disease    a. s/p CABG in 09/2011 with LIMA-LAD, Seq SVG-OM1-OM2-OM3, SVG-PDA, and SVG-RCA  . DDD (degenerative disc disease), lumbosacral   . Depression 09/2011   "after OHS" (06/14/2012)  . Hyperlipemia   .  ICD (implantable cardiac defibrillator) in place   . Ischemic cardiomyopathy    a. EF previously 15-20% in 2013, s/p Medtronic ICD placement b. repeat echo in 2017 showing EF had improved to 55-60%)  . Lung cancer ([place]) 2018   Radiation treatment  . Myocardial infarction ([place]) 08/2011  . Other primary cardiomyopathies   . Pneumonia   . Sleep apnea   . Stroke [facility]) 07/2011   denies residual  (06/14/2012)    Past Surgical History:  Procedure Laterality Date  . BACK SURGERY    . CARDIAC CATHETERIZATION  09/2011  . CARDIAC DEFIBRILLATOR PLACEMENT  06/14/2012  . CAROTID ENDARTERECTOMY  07/2011   "left" (06/14/2012)  . COLONOSCOPY N/A 10/16/2014   Dr. Gala Romney: rectal polyps removed (hyperplastic). next TCS 10 years.   . CORONARY ARTERY BYPASS GRAFT  10/20/2011   Procedure: CORONARY ARTERY BYPASS GRAFTING (CABG);  Surgeon: [name], MD;  Location: [place];  Service: Open Heart Surgery;  Laterality: N/A;  CABG x six;  using left internal mammary artery and right leg greater saphenous vein harvested endoscopically  . ESOPHAGOGASTRODUODENOSCOPY (EGD) WITH PROPOFOL N/A 11/12/2017   Procedure: ESOPHAGOGASTRODUODENOSCOPY (EGD) WITH PROPOFOL;  Surgeon: Daneil Dolin, MD;  [facility];  Service: Endoscopy;  Laterality: N/A;  7:30am  . ESOPHAGOGASTRODUODENOSCOPY (EGD) WITH PROPOFOL N/A 12/24/2017   Procedure: ESOPHAGOGASTRODUODENOSCOPY (EGD) WITH PROPOFOL;  Surgeon: Daneil Dolin, MD;  [facility];  Service: Endoscopy;  Laterality: N/A;  1:00pm  . FOREIGN BODY REMOVAL  06/05/2011   Procedure: FOREIGN BODY REMOVAL ADULT;  Surgeon: Hermelinda Dellen;  Location: [place];  Service: Plastics;  Laterality: Right;  removal foreign body from right side face   . IMPLANTABLE CARDIOVERTER DEFIBRILLATOR IMPLANT N/A 06/14/2012   Procedure: IMPLANTABLE CARDIOVERTER DEFIBRILLATOR IMPLANT;  Surgeon: Evans Lance, MD;  Location: [facility] C[facility] LAB;  Service: Cardiovascular;  Laterality: N/A;  . KNEE ARTHROSCOPY  2002   "right" (06/14/2012)  . LEFT AND RIGHT HEART CATHETERIZATION WITH CORONARY ANGIOGRAM N/A 10/16/2011   Procedure: LEFT AND RIGHT HEART CATHETERIZATION WITH CORONARY ANGIOGRAM;  Surgeon: Jolaine Artist, MD;  Location: [facility] CATH LAB;  Service: Cardiovascular;  Laterality: N/A;  . LUMBAR [place] SURGERY  2012  . MALONEY DILATION N/A 12/24/2017   Procedure: Venia Minks  DILATION;  Surgeon: Daneil Dolin, MD;  [facility];  Service: Endoscopy;  Laterality: N/A;  . MITRAL VALVE REPAIR  10/20/2011   Procedure: MITRAL VALVE REPAIR (MVR);  Surgeon: [name], MD;  Location: [place];  Service: Open Heart Surgery;  Laterality: N/A;  . POSTERIOR LUMBAR FUSION  1996  . TEE WITHOUT CARDIOVERSION  10/17/2011   Procedure: TRANSESOPHAGEAL ECHOCARDIOGRAM (TEE);  Surgeon: Jolaine Artist, MD;  Location[facility] ENDOSCOPY;  Service: Cardiovascular;  Laterality: N/A;    Current Medications: Outpatient Medications Prior to Visit  Medication Sig Dispense Refill  . aspirin 81 MG tablet Take 81 mg by mouth daily.    Marland Kitchen atorvastatin (LIPITOR) 80 MG tablet TAKE 1 TABLET EVERY DAY 90 tablet 3  . carvedilol (COREG) 12.5 MG tablet Take 1-2 tablets (12.5-25 mg total) by mouth See admin instructions. Take 12.5 mg am and 25 mg ( 2 tablets) in the pm 270 tablet 3  . EPIPEN 2-PAK 0.3 MG/0.3ML SOAJ injection Inject 0.3 mg as directed daily as needed (for anaphylactic reactions.).     Marland Kitchen escitalopram (LEXAPRO) 20 MG tablet Take 20 mg by mouth daily.    . furosemide (LASIX) 20  MG tablet TAKE 1/2 TABLET EVERY DAY 45 tablet 3  . HYDROcodone-acetaminophen (NORCO) 10-325 MG per tablet Take 1 tablet by mouth every 8 (eight) hours as needed. Pain (Patient taking differently: Take 1 tablet by mouth every 4 (four) hours as needed for moderate pain. ) 21 tablet 0  . levocetirizine (XYZAL) 5 MG tablet Take 5 mg by mouth at bedtime as needed for allergies.    . pantoprazole (PROTONIX) 40 MG tablet Take 40 mg by mouth daily before breakfast.     . spironolactone (ALDACTONE) 25 MG tablet Take 12.5 mg by mouth daily as needed. For swelling/fluid retention.    . isosorbide mononitrate (IMDUR) 30 MG 24 hr tablet Take 0.5 tablets (15 mg total) by mouth daily. 45 tablet 3   No facility-administered medications prior to visit.      Allergies:   Metoprolol; Peach flavor; Morphine and related; Bee  venom; and Lisinopril   Social History   Socioeconomic History  . Marital status: Married    Spouse name: Not on file  . Number of children: Not on file  . Years of education: Not on file  . Highest education level: Not on file  Occupational History  . Not on file  Social Needs  . Financial resource strain: Not on file  . Food insecurity:    Worry: Not on file    Inability: Not on file  . Transportation needs:    Medical: No    Non-medical: No  Tobacco Use  . Smoking status: Current Every Day Smoker    Packs/day: 0.50    Years: 46.00    Pack years: 23.00    Types: Cigarettes    Start date: 12/12/1969  . Smokeless tobacco: Never Used  Substance and Sexual Activity  . 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 Drug use: No  . Sexual activity: Never    Comment: Quit drinking alcohol 2 yrs ago  Lifestyle  . Physical activity:    Days per week: Not on file    Minutes per session: Not on file  . Stress: Not on file  Relationships  . Social connections:    Talks on phone: Not on file    Gets together: Not on file    Attends religious service: Not on file    Active member of club or organization: Not on file    Attends meetings of clubs or organizations: Not on file    Relationship status: Not on file  Other Topics Concern  . 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  Lives with wife.     Family History:  The patient's family history includes Arrhythmia in his brother; Coronary artery disease in his father and mother.   Review of Systems:   Please see the history of present illness.     General:  No chills, fever, night sweats or weight changes.  Cardiovascular:  No edema, orthopnea, palpitations, paroxysmal nocturnal dyspnea. Positive for chest pain and dyspnea on exertion.  Dermatological: No rash, lesions/masses Respiratory: No cough,  Positive for dyspnea Urologic: No hematuria, dysuria Abdominal:   No nausea, vomiting, diarrhea, bright red blood per rectum, melena, or hematemesis Neurologic:  No visual changes, wkns, changes in mental status. All other systems reviewed and are otherwise negative except as noted above.   Physical Exam:  VS:  BP 104/68 (BP Location: Right Arm)   Pulse 84   Ht 5\' 11"  (1.803 m)   Wt 209 lb (94.8 kg)   SpO2 (!) 89% Comment: w2 L oxygen  BMI 29.15 kg/m    General: Well developed, well nourished Caucasian male appearing in no acute distress. Head: Normocephalic, atraumatic, sclera non-icteric, no xanthomas, nares are without discharge.  Neck: No carotid bruits. JVD not elevated.  Lungs: Respirations regular and unlabored, without wheezes or rales. On 2L [place].  Heart: Regular rate and rhythm. No S3 or S4.  No murmur, no rubs, or gallops appreciated. Abdomen: Soft, non-tender, non-distended with normoactive bowel sounds. No hepatomegaly. No rebound/guarding. No obvious abdominal masses. Msk:  Strength and tone appear normal for age. No joint deformities or effusions. Extremities: No clubbing or cyanosis. No lower extremity edema.  Distal pedal pulses are 2+ bilaterally. Neuro: Alert and oriented X 3. Moves all extremities spontaneously. No focal deficits noted. Psych:  Responds to questions appropriately with a normal affect. Skin: No rashes or lesions noted  Wt Readings from Last 3 Encounters:  07/24/18 209 lb (94.8 kg)  06/24/18 201 lb 15.1 oz (91.6 kg)  06/06/18 209 lb 12.8 oz (95.2 kg)     Studies/Labs Reviewed:   EKG:  EKG is not ordered today.    Recent Labs: 06/21/2018: B Natriuretic Peptide 601.0 06/23/2018: ALT 20 06/24/2018: BUN 31; Creatinine, Ser 1.56; Hemoglobin 15.4; Platelets 239; Potassium 3.5; Sodium 135   Lipid Panel    Component Value Date/Time   CHOL 138 02/17/2013 0815   TRIG 229 (H) 02/17/2013 0815   HDL 28 (L) 02/17/2013 0815   CHOLHDL 4.9 02/17/2013  0815   VLDL 46 (H) 02/17/2013 0815   LDLCALC 64 02/17/2013 0815    Additional studies/ records that were reviewed today include:   Echocardiogram: 09/2015 Study Conclusions  - Left ventricle: The cavity size was mildly dilated. Wall   thickness was normal. Systolic function was normal. The estimated   ejection fraction was in the range of 55% to 60%. Wall motion was   normal; there were no regional wall motion abnormalities.   Features are consistent with a pseudonormal left ventricular   filling pattern, with concomitant abnormal relaxation and   increased filling pressure (grade 2 diastolic dysfunction). - Aortic valve: There was mild regurgitation. - Mitral valve: Mildly thickened leaflets . There was trivial   regurgitation. Valve area by pressure half-time: 2.27 cm^2. - Left atrium: The atrium was mildly dilated. - Right ventricle: Pacer wire or catheter noted in right ventricle. - Right atrium: The atrium was mildly dilated. - Atrial septum: No defect or patent foramen ovale was identified. - Tricuspid valve: There was trivial regurgitation. - Pulmonary arteries: PA peak pressure: 34 mm Hg (S). - Pericardium, extracardiac: There was no pericardial effusion.  Impressions:  - Mildly dilated LV chamber size with LVEF 55-60%. Grade 2   diastolic dysfunction with increased LV filling pressure. Mild   left atrial enlargement. Status post mitral annuloplasty with   mildly thickened leaflets and trivial mitral regurgitation. Mild   aortic regurgitation. Device wire noted within the right heart.   Trivial tricuspid regurgitation with PASP 34 mmHg.  NST: 04/2018  No diagnostic ST segment changes to indicate ischemia.  Small, mild intensity, apical to basal inferior defect that is partially reversible, particularly in the mid to apical zones. This is consistent with scar and mild peri-infarct ischemia.  Small, mild intensity, apical anterior defect that is partially  reversible  and consistent with scar with mild peri-infarct ischemia.  This is an intermediate risk study.  Nuclear stress EF: 48%.   Assessment:    1. Coronary artery disease involving native coronary artery of native heart with angina pectoris ([place])   2. DOE (dyspnea on exertion)   3. History of cardiomyopathy   4. Mitral valve disease   5. Essential hypertension   6. Mixed hyperlipidemia   7. Interstitial pulmonary disease ([place])      Plan:   In order of problems listed above:  1. CAD/ Dyspnea on Exertion - he is s/p CABG in 09/2011 with LIMA-LAD, Seq SVG-OM1-OM2-OM3, SVG-PDA, and SVG-RCA. Recent NST in 04/2018 showed scar and mild peri-infarct ischemia with medical management initially recommended and consideration of a cardiac catheterization if he developed refractory or progressive symptoms.  - he is still having dyspnea on exertion which is likely multifactorial in the setting of his CHF, COPD, and interstitial lung disease. However, he reports dyspnea was his main anginal equivalent in 2013 as well. - will obtain a repeat echocardiogram to reassess for any structural abnormalities along with his mitral valve given recent CHF exacerbation. Will titrate Imdur to 30mg  daily and obtain recent labs from his PCP to reassess kidney function as this was trending upwards at the time of recent hospital discharge. If echo shows EF is reduced again, noted to have WMA, or his symptoms persist despite titration of medical therapy, would consider cardiac catheterization at the time of his next office visit if renal function has stabilized. This was reviewed with the patient today. - continue ASA, statin, and BB therapy.   Addendum: 07/25/2018: Received labs from PCP. [name] creatinine was elevated to 1.56 on 06/24/2018, improved to 1.04 when checked on 07/02/2018.  2. History of Cardiomyopathy - EF previously reduced to 15-20% in 2013 and he is s/p Medtronic ICD placement. Echocardiogram in  2017 showed his EF had improved to 55-60%. EF was read as 48% by recent NST and given his progressive dyspnea on exertion and recent hospitalization for a CHF exacerbation, will obtain a repeat echocardiogram to assess for any structural abnormalities.  - he appears euvolemic by examination and remains on low-dose Lasix 10mg  daily. Will obtain recent labs from his PCP as these were checked 2-3 weeks ago per his report.   3. Mitral regurgitation - s/p repair with annuloplasty ring in 09/2011. Will obtain a repeat echocardiogram as outlined above in the setting of his progressive dyspnea.   4. HTN - BP is well-controlled at 104/68 during today's visit. Remains on Coreg 12.5mg  in AM/25mg  in PM along with Imdur which is being titrated as outlined above. Spironolactone and Lisinopril previously discontinued by his PCP due to orthostasis and variable kidney function.   5. HLD - followed by PCP. Goal LDL is < 70 with known CAD. - continue Atorvastatin 80mg  daily.   6. Interstitial Lung Disease - CTA during recent admission showed chronic changes of pulmonary fibrosis in a pattern most consistent with interstitial pneumonitis and emphysema. Reviewed with the patient and his family today. Was previously followed by Dr. Luan Pulling and they are in agreement to follow-up with him again given the patient's persistent oxygen requirement at this time.    Medication Adjustments/Labs and Tests Ordered: Current medicines are reviewed at length with the patient today.  Concerns regarding medicines are outlined above.  Medication changes, Labs and Tests ordered today are listed in the Patient Instructions below. Patient Instructions  Medication Instructions:  Your physician has recommended  you make the following change in your medication:  Increase Imdur to 30 mg Daily  Start Nitro as needed for chest pain    If you need a refill on your cardiac medications before your next appointment, please call your pharmacy.    Lab work: NONE  If you have labs (blood work) drawn today and your tests are completely normal, you will receive your results only by: Marland Kitchen MyChart Message (if you have MyChart) OR . A paper copy in the mail If you have any lab test that is abnormal or we need to change your treatment, we will call you to review the results.  Testing/Procedures: Your physician has requested that you have an echocardiogram. Echocardiography is a painless test that uses sound waves to create images of your heart. It provides your doctor with information about the size and shape of your heart and how well your heart's chambers and valves are working. This procedure takes approximately one hour. There are no restrictions for this procedure.  You have been referred to Dr. Luan Pulling    Follow-Up: At [facility], you and your health needs are our priority.  As part of our continuing mission to provide you with exceptional heart care, we have created designated Provider Care Teams.  These Care Teams include your primary Cardiologist (physician) and Advanced Practice Providers (APPs -  Physician Assistants and Nurse Practitioners) who all work together to provide you with the care you need, when you need it. You will need a follow up appointment in 3-4 weeks.  Please call our office 2 months in advance to schedule this appointment.  You may see Carlyle Dolly, MD or one of the following Advanced Practice Providers on your designated Care Team:   Bernerd Pho, PA-C [facility]) . Ermalinda Barrios, PA-C ([place])  Any Other Special Instructions Will Be Listed Below (If Applicable). Thank you for choosing [address]!     Signed, Erma Heritage, PA-C  07/24/2018 8:01 PM    [facility] [address] [place], [place] [address] Phone: [phone] Fax: [phone]

## 2018-07-24 ENCOUNTER — Encounter: Payer: Self-pay | Admitting: *Deleted

## 2018-07-24 ENCOUNTER — Ambulatory Visit (INDEPENDENT_AMBULATORY_CARE_PROVIDER_SITE_OTHER): Payer: Medicare HMO | Admitting: Student

## 2018-07-24 ENCOUNTER — Encounter: Payer: Self-pay | Admitting: Student

## 2018-07-24 VITALS — BP 104/68 | HR 84 | Ht 71.0 in | Wt 209.0 lb

## 2018-07-24 DIAGNOSIS — Z8679 Personal history of other diseases of the circulatory system: Secondary | ICD-10-CM

## 2018-07-24 DIAGNOSIS — I10 Essential (primary) hypertension: Secondary | ICD-10-CM | POA: Diagnosis not present

## 2018-07-24 DIAGNOSIS — R0609 Other forms of dyspnea: Secondary | ICD-10-CM | POA: Diagnosis not present

## 2018-07-24 DIAGNOSIS — I25119 Atherosclerotic heart disease of native coronary artery with unspecified angina pectoris: Secondary | ICD-10-CM

## 2018-07-24 DIAGNOSIS — J849 Interstitial pulmonary disease, unspecified: Secondary | ICD-10-CM | POA: Diagnosis not present

## 2018-07-24 DIAGNOSIS — I059 Rheumatic mitral valve disease, unspecified: Secondary | ICD-10-CM

## 2018-07-24 DIAGNOSIS — E782 Mixed hyperlipidemia: Secondary | ICD-10-CM | POA: Diagnosis not present

## 2018-07-24 MED ORDER — ISOSORBIDE MONONITRATE ER 30 MG PO TB24: 30.0000 mg | ORAL_TABLET | Freq: Every day | ORAL | 3 refills | Status: AC

## 2018-07-24 MED ORDER — NITROGLYCERIN 0.4 MG SL SUBL: 0.4000 mg | SUBLINGUAL_TABLET | SUBLINGUAL | 3 refills | Status: AC | PRN

## 2018-07-24 MED ORDER — TIOTROPIUM BROMIDE MONOHYDRATE 18 MCG IN CAPS: 18.0000 ug | ORAL_CAPSULE | Freq: Every day | RESPIRATORY_TRACT | 12 refills | Status: AC

## 2018-07-24 NOTE — Patient Instructions (Signed)
Medication Instructions:  Your physician has recommended you make the following change in your medication:  Increase Imdur to 30 mg Daily  Start Nitro as needed for chest pain    If you need a refill on your cardiac medications before your next appointment, please call your pharmacy.   Lab work: NONE  If you have labs (blood work) drawn today and your tests are completely normal, you will receive your results only by: Marland Kitchen MyChart Message (if you have MyChart) OR . A paper copy in the mail If you have any lab test that is abnormal or we need to change your treatment, we will call you to review the results.  Testing/Procedures: Your physician has requested that you have an echocardiogram. Echocardiography is a painless test that uses sound waves to create images of your heart. It provides your doctor with information about the size and shape of your heart and how well your heart's chambers and valves are working. This procedure takes approximately one hour. There are no restrictions for this procedure.  You have been referred to Dr. Luan Pulling    Follow-Up: At [facility], you and your health needs are our priority.  As part of our continuing mission to provide you with exceptional heart care, we have created designated Provider Care Teams.  These Care Teams include your primary Cardiologist (physician) and Advanced Practice Providers (APPs -  Physician Assistants and Nurse Practitioners) who all work together to provide you with the care you need, when you need it. You will need a follow up appointment in 3-4 weeks.  Please call our office 2 months in advance to schedule this appointment.  You may see Carlyle Dolly, MD or one of the following Advanced Practice Providers on your designated Care Team:   Bernerd Pho, PA-C [facility]) . Ermalinda Barrios, PA-C ([place])  Any Other Special Instructions Will Be Listed Below (If Applicable). Thank you for choosing [facility]!

## 2018-07-25 DIAGNOSIS — I509 Heart failure, unspecified: Secondary | ICD-10-CM | POA: Diagnosis not present

## 2018-07-25 DIAGNOSIS — J1520 Pneumonia due to staphylococcus, unspecified: Secondary | ICD-10-CM | POA: Diagnosis not present

## 2018-07-25 DIAGNOSIS — G4733 Obstructive sleep apnea (adult) (pediatric): Secondary | ICD-10-CM | POA: Diagnosis not present

## 2018-07-25 DIAGNOSIS — R0602 Shortness of breath: Secondary | ICD-10-CM | POA: Diagnosis not present

## 2018-07-25 DIAGNOSIS — I5022 Chronic systolic (congestive) heart failure: Secondary | ICD-10-CM | POA: Diagnosis not present

## 2018-07-25 DIAGNOSIS — J9690 Respiratory failure, unspecified, unspecified whether with hypoxia or hypercapnia: Secondary | ICD-10-CM | POA: Diagnosis not present

## 2018-07-30 DIAGNOSIS — R0602 Shortness of breath: Secondary | ICD-10-CM | POA: Diagnosis not present

## 2018-07-30 DIAGNOSIS — J1520 Pneumonia due to staphylococcus, unspecified: Secondary | ICD-10-CM | POA: Diagnosis not present

## 2018-07-30 DIAGNOSIS — G4733 Obstructive sleep apnea (adult) (pediatric): Secondary | ICD-10-CM | POA: Diagnosis not present

## 2018-07-30 DIAGNOSIS — I5022 Chronic systolic (congestive) heart failure: Secondary | ICD-10-CM | POA: Diagnosis not present

## 2018-07-30 DIAGNOSIS — I509 Heart failure, unspecified: Secondary | ICD-10-CM | POA: Diagnosis not present

## 2018-07-30 DIAGNOSIS — J9690 Respiratory failure, unspecified, unspecified whether with hypoxia or hypercapnia: Secondary | ICD-10-CM | POA: Diagnosis not present

## 2018-07-31 ENCOUNTER — Ambulatory Visit (HOSPITAL_COMMUNITY)
Admission: RE | Admit: 2018-07-31 | Discharge: 2018-07-31 | Disposition: A | Discharge status: Home / Self Care | Payer: Medicare HMO | Source: Ambulatory Visit | Attending: Student | Admitting: Student

## 2018-07-31 DIAGNOSIS — G4733 Obstructive sleep apnea (adult) (pediatric): Secondary | ICD-10-CM | POA: Insufficient documentation

## 2018-07-31 DIAGNOSIS — J449 Chronic obstructive pulmonary disease, unspecified: Secondary | ICD-10-CM | POA: Insufficient documentation

## 2018-07-31 DIAGNOSIS — Z95810 Presence of automatic (implantable) cardiac defibrillator: Secondary | ICD-10-CM | POA: Diagnosis not present

## 2018-07-31 DIAGNOSIS — E785 Hyperlipidemia, unspecified: Secondary | ICD-10-CM | POA: Insufficient documentation

## 2018-07-31 DIAGNOSIS — I509 Heart failure, unspecified: Secondary | ICD-10-CM | POA: Insufficient documentation

## 2018-07-31 DIAGNOSIS — I2510 Atherosclerotic heart disease of native coronary artery without angina pectoris: Secondary | ICD-10-CM | POA: Diagnosis not present

## 2018-07-31 DIAGNOSIS — Z8679 Personal history of other diseases of the circulatory system: Secondary | ICD-10-CM | POA: Diagnosis not present

## 2018-07-31 DIAGNOSIS — R0602 Shortness of breath: Secondary | ICD-10-CM

## 2018-07-31 DIAGNOSIS — I252 Old myocardial infarction: Secondary | ICD-10-CM | POA: Insufficient documentation

## 2018-07-31 NOTE — Progress Notes (Signed)
*  PRELIMINARY RESULTS* Echocardiogram 2D Echocardiogram has been performed.  Charles Savage 07/31/2018, 11:39 AM

## 2018-08-01 NOTE — Progress Notes (Signed)
With his hypoxia on 2L [place] and recent CT findings I would wait for evaluation by Dr Luan Pulling prior to committing to cath, ongoing hypoxia would suggest primary issue is lung. If thought not to be a primary issue by pulmonary then would reconsider cath,would plan for RHC/LHC since he PA pressures were elevated by echo   Carlyle Dolly MD

## 2018-08-07 DIAGNOSIS — J8410 Pulmonary fibrosis, unspecified: Secondary | ICD-10-CM | POA: Diagnosis not present

## 2018-08-07 DIAGNOSIS — I2510 Atherosclerotic heart disease of native coronary artery without angina pectoris: Secondary | ICD-10-CM | POA: Diagnosis not present

## 2018-08-07 DIAGNOSIS — Z6831 Body mass index (BMI) 31.0-31.9, adult: Secondary | ICD-10-CM | POA: Diagnosis not present

## 2018-08-07 DIAGNOSIS — E118 Type 2 diabetes mellitus with unspecified complications: Secondary | ICD-10-CM | POA: Diagnosis not present

## 2018-08-07 DIAGNOSIS — E785 Hyperlipidemia, unspecified: Secondary | ICD-10-CM | POA: Diagnosis not present

## 2018-08-12 DIAGNOSIS — J449 Chronic obstructive pulmonary disease, unspecified: Secondary | ICD-10-CM | POA: Diagnosis not present

## 2018-08-12 DIAGNOSIS — G4733 Obstructive sleep apnea (adult) (pediatric): Secondary | ICD-10-CM | POA: Diagnosis not present

## 2018-08-12 DIAGNOSIS — I2510 Atherosclerotic heart disease of native coronary artery without angina pectoris: Secondary | ICD-10-CM | POA: Diagnosis not present

## 2018-08-12 DIAGNOSIS — J9611 Chronic respiratory failure with hypoxia: Secondary | ICD-10-CM | POA: Diagnosis not present

## 2018-08-13 ENCOUNTER — Other Ambulatory Visit (HOSPITAL_COMMUNITY): Payer: Self-pay | Admitting: Respiratory Therapy

## 2018-08-13 DIAGNOSIS — J441 Chronic obstructive pulmonary disease with (acute) exacerbation: Secondary | ICD-10-CM

## 2018-08-16 DIAGNOSIS — R0602 Shortness of breath: Secondary | ICD-10-CM | POA: Diagnosis not present

## 2018-08-16 DIAGNOSIS — I509 Heart failure, unspecified: Secondary | ICD-10-CM | POA: Diagnosis not present

## 2018-08-16 DIAGNOSIS — G4733 Obstructive sleep apnea (adult) (pediatric): Secondary | ICD-10-CM | POA: Diagnosis not present

## 2018-08-16 DIAGNOSIS — J9690 Respiratory failure, unspecified, unspecified whether with hypoxia or hypercapnia: Secondary | ICD-10-CM | POA: Diagnosis not present

## 2018-08-16 DIAGNOSIS — J1520 Pneumonia due to staphylococcus, unspecified: Secondary | ICD-10-CM | POA: Diagnosis not present

## 2018-08-28 ENCOUNTER — Encounter: Payer: Self-pay | Admitting: Cardiology

## 2018-08-28 ENCOUNTER — Ambulatory Visit: Payer: Medicare HMO | Admitting: Cardiology

## 2018-08-28 VITALS — BP 108/68 | HR 78 | Ht 71.0 in | Wt 210.0 lb

## 2018-08-28 DIAGNOSIS — I2510 Atherosclerotic heart disease of native coronary artery without angina pectoris: Secondary | ICD-10-CM | POA: Diagnosis not present

## 2018-08-28 DIAGNOSIS — R062 Wheezing: Secondary | ICD-10-CM | POA: Diagnosis not present

## 2018-08-28 DIAGNOSIS — E782 Mixed hyperlipidemia: Secondary | ICD-10-CM | POA: Diagnosis not present

## 2018-08-28 DIAGNOSIS — I509 Heart failure, unspecified: Secondary | ICD-10-CM | POA: Diagnosis not present

## 2018-08-28 DIAGNOSIS — I5022 Chronic systolic (congestive) heart failure: Secondary | ICD-10-CM | POA: Diagnosis not present

## 2018-08-28 DIAGNOSIS — G4733 Obstructive sleep apnea (adult) (pediatric): Secondary | ICD-10-CM | POA: Diagnosis not present

## 2018-08-28 NOTE — Patient Instructions (Signed)
Medication Instructions:  Your physician recommends that you continue on your current medications as directed. Please refer to the Current Medication list given to you today.   Labwork: I will request labs from pcp  Testing/Procedures: none  Follow-Up: Your physician recommends that you schedule a follow-up appointment in: 2 months    Any Other Special Instructions Will Be Listed Below (If Applicable).     If you need a refill on your cardiac medications before your next appointment, please call your pharmacy.

## 2018-08-28 NOTE — Progress Notes (Signed)
Clinical Summary Charles Savage is a 63 y.o.male seen today for follow up of the following medical problems.    1. CAD/ICM  - Echo 11/2011 LVEF 15-20%,  - CABG 09/2011 x 6 vessels (LIMA-LAD with sequential SVG to OM1, OM2, OM3. SVG-PDA o LCX, SVG to RCA.  - he has an ICD, Medtronic dual chamber ,that is followed by EP Dr. Lovena Le.  - did not tolerate coreg 25mg  bid, we cut back to 12.5mg  in AM and 25mg  in PM. Overall medication titration has been limited by soft bp's. -echo 09/2015 that shows LVEF has normalized at 55-60%   04/2018 nuclear stress inferior infarct with mild isschemia, intermediate risk 07/2018 echo LVEF 45-50%, grade II diastolic dysfunction, PASP 60  - no recent edema. Home weights 209-211 lbs.  - compliant with meds - ongoing SOB that appears to be lung related, significant hypoxia requiring home O2. Underoign evaluation by Dr Luan Pulling  2. Mitral valve regurgitation - repair with 28 mm annuloplasty ring 09/2011 - recent echo with normal function  3. Hyperlipidemia - complitn with statin   4. OSA  - 09/2015 sleep study with severe OSA. - followed by Dr Luan Pulling   5. Carotid stenosis - history of left CEA   6. Lung cancer - on radiation treatmentper family report - resolved. Followed Dr Tammi Klippel oncology.   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  - on home O2 2 L. Home O2 sats low 90s.  Past Medical History:  Diagnosis Date  . 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 Arthritis    "back; knees" (06/14/2012)  . Cerebrovascular disease    h/o CVA in 07/2011 + left carotid endarterectomy  . CHF (congestive heart failure) ([place])   . Cholelithiasis 07/05/2012   Asymptomatic; identified on CT scanning of the chest;  . Chronic lower back pain   . COPD (chronic obstructive pulmonary disease) ([place])    on 2L O2 at home since 08/2011  .  Coronary artery disease    a. s/p CABG in 09/2011 with LIMA-LAD, Seq SVG-OM1-OM2-OM3, SVG-PDA, and SVG-RCA  . DDD (degenerative disc disease), lumbosacral   . Depression 09/2011   "after OHS" (06/14/2012)  . Hyperlipemia   . ICD (implantable cardiac defibrillator) in place   . Ischemic cardiomyopathy    a. EF previously 15-20% in 2013, s/p Medtronic ICD placement b. repeat echo in 2017 showing EF had improved to 55-60%)  . Lung cancer ([place]) 2018   Radiation treatment  . Myocardial infarction ([place]) 08/2011  . Other primary cardiomyopathies   . Pneumonia   . Sleep apnea   . Stroke [facility]) 07/2011   denies residual (06/14/2012)     Allergies  Allergen Reactions  . Metoprolol Shortness Of Breath and Nausea And Vomiting  . Peach Flavor Hives, Itching and Other (See Comments)    "peaches; peach flavoring; actually happened after he ate a store-bought peach pie"  . Morphine And Related Hives and Itching  . Bee Venom Hives and Swelling  . Lisinopril Cough     Current Outpatient Medications  Medication Sig Dispense Refill  . aspirin 81 MG tablet Take 81 mg by mouth daily.    Marland Kitchen atorvastatin (LIPITOR) 80 MG tablet TAKE 1 TABLET EVERY DAY 90 tablet 3  . carvedilol (COREG) 12.5 MG tablet Take 1-2 tablets (12.5-25 mg total) by mouth See admin instructions. Take 12.5 mg am and 25  mg ( 2 tablets) in the pm 270 tablet 3  . EPIPEN 2-PAK 0.3 MG/0.3ML SOAJ injection Inject 0.3 mg as directed daily as needed (for anaphylactic reactions.).     Marland Kitchen escitalopram (LEXAPRO) 20 MG tablet Take 20 mg by mouth daily.    . furosemide (LASIX) 20 MG tablet TAKE 1/2 TABLET EVERY DAY 45 tablet 3  . HYDROcodone-acetaminophen (NORCO) 10-325 MG per tablet Take 1 tablet by mouth every 8 (eight) hours as needed. Pain (Patient taking differently: Take 1 tablet by mouth every 4 (four) hours as needed for moderate pain. ) 21 tablet 0  . isosorbide mononitrate (IMDUR) 30 MG 24 hr tablet Take 1 tablet (30 mg total) by mouth  daily. 90 tablet 3  . levocetirizine (XYZAL) 5 MG tablet Take 5 mg by mouth at bedtime as needed for allergies.    . nitroGLYCERIN (NITROSTAT) 0.4 MG SL tablet Place 1 tablet (0.4 mg total) under the tongue every 5 (five) minutes as needed for chest pain. 90 tablet 3  . pantoprazole (PROTONIX) 40 MG tablet Take 40 mg by mouth daily before breakfast.     . spironolactone (ALDACTONE) 25 MG tablet Take 12.5 mg by mouth daily as needed. For swelling/fluid retention.    Marland Kitchen tiotropium (SPIRIVA HANDIHALER) 18 MCG inhalation capsule Place 1 capsule (18 mcg total) into inhaler and inhale daily. 30 capsule 12   No current facility-administered medications for this visit.      Past Surgical History:  Procedure Laterality Date  . BACK SURGERY    . CARDIAC CATHETERIZATION  09/2011  . CARDIAC DEFIBRILLATOR PLACEMENT  06/14/2012  . CAROTID ENDARTERECTOMY  07/2011   "left" (06/14/2012)  . COLONOSCOPY N/A 10/16/2014   Dr. Gala Romney: rectal polyps removed (hyperplastic). next TCS 10 years.   . CORONARY ARTERY BYPASS GRAFT  10/20/2011   Procedure: CORONARY ARTERY BYPASS GRAFTING (CABG);  Surgeon: [name], MD;  Location: [place];  Service: Open Heart Surgery;  Laterality: N/A;  CABG x six;  using left internal mammary artery and right leg greater saphenous vein harvested endoscopically  . ESOPHAGOGASTRODUODENOSCOPY (EGD) WITH PROPOFOL N/A 11/12/2017   Procedure: ESOPHAGOGASTRODUODENOSCOPY (EGD) WITH PROPOFOL;  Surgeon: Daneil Dolin, MD;  [facility];  Service: Endoscopy;  Laterality: N/A;  7:30am  . ESOPHAGOGASTRODUODENOSCOPY (EGD) WITH PROPOFOL N/A 12/24/2017   Procedure: ESOPHAGOGASTRODUODENOSCOPY (EGD) WITH PROPOFOL;  Surgeon: Daneil Dolin, MD;  [facility];  Service: Endoscopy;  Laterality: N/A;  1:00pm  . FOREIGN BODY REMOVAL  06/05/2011   Procedure: FOREIGN BODY REMOVAL ADULT;  Surgeon: Hermelinda Dellen;  Location: [place];  Service: Plastics;  Laterality:  Right;  removal foreign body from right side face   . IMPLANTABLE CARDIOVERTER DEFIBRILLATOR IMPLANT N/A 06/14/2012   Procedure: IMPLANTABLE CARDIOVERTER DEFIBRILLATOR IMPLANT;  Surgeon: Evans Lance, MD;  Location: [facility] CATH LAB;  Service: Cardiovascular;  Laterality: N/A;  . KNEE ARTHROSCOPY  2002   "right" (06/14/2012)  . LEFT AND RIGHT HEART CATHETERIZATION WITH CORONARY ANGIOGRAM N/A 10/16/2011   Procedure: LEFT AND RIGHT HEART CATHETERIZATION WITH CORONARY ANGIOGRAM;  Surgeon: Jolaine Artist, MD;  Location: [facility] CATH LAB;  Service: Cardiovascular;  Laterality: N/A;  . LUMBAR [place] SURGERY  2012  . MALONEY DILATION N/A 12/24/2017   Procedure: Venia Minks DILATION;  Surgeon: Daneil Dolin, MD;  [facility];  Service: Endoscopy;  Laterality: N/A;  . MITRAL VALVE REPAIR  10/20/2011   Procedure: MITRAL VALVE REPAIR (MVR);  Surgeon: Fernande Boyden  Cyndia Bent, MD;  Location: [place] [facility];  Service: Open Heart Surgery;  Laterality: N/A;  . POSTERIOR LUMBAR FUSION  1996  . TEE WITHOUT CARDIOVERSION  10/17/2011   Procedure: TRANSESOPHAGEAL ECHOCARDIOGRAM (TEE);  Surgeon: Jolaine Artist, MD;  Location: [facility] ENDOSCOPY;  Service: Cardiovascular;  Laterality: N/A;     Allergies  Allergen Reactions  . Metoprolol Shortness Of Breath and Nausea And Vomiting  . Peach Flavor Hives, Itching and Other (See Comments)    "peaches; peach flavoring; actually happened after he ate a store-bought peach pie"  . Morphine And Related Hives and Itching  . Bee Venom Hives and Swelling  . Lisinopril Cough      Family History  Problem Relation Age of Onset  . Coronary artery disease Mother   . Coronary artery disease Father   . Arrhythmia Brother   . Colon cancer Neg Hx      Social History Mr. Ballen reports that he has been smoking cigarettes. He started smoking about 48 years ago. He has a 23.00 pack-year smoking history. He has never used smokeless tobacco. Mr. Alicea reports no history of alcohol  use.   Review of Systems CONSTITUTIONAL: No weight loss, fever, chills, weakness or fatigue.  HEENT: Eyes: No visual loss, blurred vision, double vision or yellow sclerae.No hearing loss, sneezing, congestion, runny nose or sore throat.  SKIN: No rash or itching.  CARDIOVASCULAR: per hpi RESPIRATORY: per hpi GASTROINTESTINAL: No anorexia, nausea, vomiting or diarrhea. No abdominal pain or blood.  GENITOURINARY: No burning on urination, no polyuria NEUROLOGICAL: No headache, dizziness, syncope, paralysis, ataxia, numbness or tingling in the extremities. No change in bowel or bladder control.  MUSCULOSKELETAL: No muscle, back pain, joint pain or stiffness.  LYMPHATICS: No enlarged nodes. No history of splenectomy.  PSYCHIATRIC: No history of depression or anxiety.  ENDOCRINOLOGIC: No reports of sweating, cold or heat intolerance. No polyuria or polydipsia.  Marland Kitchen   Physical Examination Vitals:   08/28/18 1316  BP: 108/68  Pulse: 78  SpO2: (!) 75%   Vitals:   08/28/18 1316  Weight: 210 lb (95.3 kg)  Height: 5\' 11"  (1.803 m)    Gen: resting comfortably, no acute distress HEENT: no scleral icterus, pupils equal round and reactive, no palptable cervical adenopathy,  CV: RRR, no m/r/g, no jvd Resp: mild bilatearl dry crackles GI: abdomen is soft, non-tender, non-distended, normal bowel sounds, no hepatosplenomegaly MSK: extremities are warm, no edema.  Skin: warm, no rash Neuro:  no focal deficits Psych: appropriate affect   Diagnostic Studies  11/2011 Echo LVEF 15-20%, anular MV ring with trivial MR, severe RV dysfunction,  09/2011 TEE LEFT VENTRICLE: EF = 25% Mildly dilated. Global HK.   RIGHT VENTRICLE: Moderately HK  LEFT ATRIUM: Moderately dilated  LEFT ATRIAL APPENDAGE: No thrombus  RIGHT ATRIUM: Normal  AORTIC VALVE: Trileaflet. Trivial AI. No AS.  MITRAL VALVE: Structurally normal. Mild to moderate MR.  TRICUSPID VALVE: Normal Trivial TR  PULMONIC  VALVE: Normal  INTERATRIAL SEPTUM: No ASD or PFO.  PERICARDIUM: Moderate effusion along RA//RV. No tamponade.  DESCENDING AORTA: Severe plaque.  09/2011 Cath Findings: On milrinone 0.25 mg.kg/min  RA = 5  RV = 30/5/7  PA = 30/16 (23)  PCW = 20 (no significant v-waves)  Fick cardiac output/index = 5.46/2.8  PVR = 0.5 [place]  SVR = 981  FA sat = 93%  PA sat = 67%, 71% (on milrinone)  Ao Pressure: 92/63 (76)  LV Pressure: 98/8/18  There was no signficant gradient across  the aortic valve on pullback.  Left main: 80-90% distal  LAD: Flush occlusion at ostium. Mild filling in mid and distal segments through L to L and R to L collaterals  LCX: Large. Dominant. 70-80% mid AV groove. OM-1 small to moderate vessel with mild ostial disease. OM-2 very large 99% lesion in mid section at trifurcation. 90% lesion in lowest branch. PDA 50-60 ostial  RCA: Small to moderate-sized, non- dominant vessel ending with acute marginal branch. Diffuse 60% prox to mid. 95% midsection. R to L collats to LAD from acute marginal.  LV-gram done in the RAO projection: Ejection fraction = 25-30% with global HK 3+ MR  L subclavian: Widely patent to chest wall with mild plaquing  Assessment: 1. 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 Ischemic CM with EF 25-30%  3. 3+ mitral regurgitation  4. Well compensated hemodynamics on milrinone  Plan/Discussion: Will need CABG/MVR. Check PFTs and TEE. Continue milrinone. Transfer stepdown. Consult TCTS.   09/2015 echo Study Conclusions  - Left ventricle: The cavity size was mildly dilated. Wall thickness was normal. Systolic function was normal. The estimated ejection fraction was in the range of 55% to 60%. Wall motion was normal; there were no regional wall motion abnormalities. Features are consistent with a pseudonormal left ventricular filling pattern, with concomitant abnormal relaxation  and increased filling pressure (grade 2 diastolic dysfunction). - Aortic valve: There was mild regurgitation. - Mitral valve: Mildly thickened leaflets . There was trivial regurgitation. Valve area by pressure half-time: 2.27 cm^2. - Left atrium: The atrium was mildly dilated. - Right ventricle: Pacer wire or catheter noted in right ventricle. - Right atrium: The atrium was mildly dilated. - Atrial septum: No defect or patent foramen ovale was identified. - Tricuspid valve: There was trivial regurgitation. - Pulmonary arteries: PA peak pressure: 34 mm Hg (S). - Pericardium, extracardiac: There was no pericardial effusion.  Impressions:  - Mildly dilated LV chamber size with LVEF 55-60%. Grade 2 diastolic dysfunction with increased LV filling pressure. Mild left atrial enlargement. Status post mitral annuloplasty with mildly thickened leaflets and trivial mitral regurgitation. Mild aortic regurgitation. Device wire noted within the right heart. Trivial tricuspid regurgitation with PASP 34 mmHg.   04/2018 nuclear stress  No diagnostic ST segment changes to indicate ischemia.  Small, mild intensity, apical to basal inferior defect that is partially reversible, particularly in the mid to apical zones. This is consistent with scar and mild peri-infarct ischemia.  Small, mild intensity, apical anterior defect that is partially reversible and consistent with scar with mild peri-infarct ischemia.  This is an intermediate risk study.  Nuclear stress EF: 48%.   07/2018 echo 1. The left ventricle has mildly reduced systolic function of 09-60%. The cavity size is mildly increased. There is no increased left ventricular wall thickness. Echo evidence of pseudonormalization in diastolic relaxation.  2. There is moderate hypokinesis of the basiliar-mid septal left ventricular segment.  3. The right ventricle has low normal systolic function. The cavity in normal in size. There  is no increase in right ventricular wall thickness. Right ventricular systolic pressure is severely elevated with an estimated pressure of 59.6 mmHg.  4. Mildly dilated left atrial size.  5. Status post mitral annuloplasty There is mild thickening.  6. The aortic valve is tricuspid. There is mild aortic annular calcification noted.  7. The pulmonic valve is grossly normal.  8. The aortic root is normal in size and structure.  Assessment and Plan   1.  CAD/ICM/Chronic systolic HF -appears euvolemic. HF is compensated and not playing a role in his ongoing significant hypoxia. O2 sats mid 70s here on room air, improved back on his home O2 - continue curernt meds    2. Mitral valve regurgitation - s/p repair - recent echo shows stable repair, continue to monitor  3. Hyperlipidemia - he will continue statin    F/u 2 months   Arnoldo Lenis, M.D.

## 2018-09-03 DIAGNOSIS — E6609 Other obesity due to excess calories: Secondary | ICD-10-CM | POA: Diagnosis not present

## 2018-09-03 DIAGNOSIS — G894 Chronic pain syndrome: Secondary | ICD-10-CM | POA: Diagnosis not present

## 2018-09-03 DIAGNOSIS — Z1389 Encounter for screening for other disorder: Secondary | ICD-10-CM | POA: Diagnosis not present

## 2018-09-03 DIAGNOSIS — Z6831 Body mass index (BMI) 31.0-31.9, adult: Secondary | ICD-10-CM | POA: Diagnosis not present

## 2018-09-09 ENCOUNTER — Ambulatory Visit (INDEPENDENT_AMBULATORY_CARE_PROVIDER_SITE_OTHER): Payer: Medicare HMO | Admitting: *Deleted

## 2018-09-09 ENCOUNTER — Other Ambulatory Visit: Payer: Self-pay

## 2018-09-09 DIAGNOSIS — I255 Ischemic cardiomyopathy: Secondary | ICD-10-CM | POA: Diagnosis not present

## 2018-09-09 DIAGNOSIS — I5022 Chronic systolic (congestive) heart failure: Secondary | ICD-10-CM

## 2018-09-10 LAB — CUP PACEART REMOTE DEVICE CHECK
Battery Remaining Longevity: 74 mo
Date Time Interrogation Session: 20200316052308
HighPow Impedance: 70 Ohm
Implantable Lead Implant Date: 20131220
Implantable Lead Location: 753860
Implantable Pulse Generator Implant Date: 20131220
Lead Channel Impedance Value: 342 Ohm
Lead Channel Pacing Threshold Amplitude: 0.625 V
Lead Channel Pacing Threshold Pulse Width: 0.4 ms
Lead Channel Sensing Intrinsic Amplitude: 4.375 mV
Lead Channel Setting Pacing Amplitude: 2 V
Lead Channel Setting Pacing Pulse Width: 0.4 ms
Lead Channel Setting Sensing Sensitivity: 0.3 mV
MDC IDC MSMT BATTERY VOLTAGE: 3.01 V
MDC IDC MSMT LEADCHNL RV IMPEDANCE VALUE: 399 Ohm
MDC IDC MSMT LEADCHNL RV SENSING INTR AMPL: 4.375 mV
MDC IDC STAT BRADY RV PERCENT PACED: 0.01 %

## 2018-09-11 ENCOUNTER — Ambulatory Visit (HOSPITAL_COMMUNITY)
Admission: RE | Admit: 2018-09-11 | Discharge: 2018-09-11 | Disposition: A | Discharge status: Home / Self Care | Payer: Medicare HMO | Source: Ambulatory Visit | Attending: Pulmonary Disease | Admitting: Pulmonary Disease

## 2018-09-11 ENCOUNTER — Other Ambulatory Visit: Payer: Self-pay

## 2018-09-11 DIAGNOSIS — J441 Chronic obstructive pulmonary disease with (acute) exacerbation: Secondary | ICD-10-CM | POA: Insufficient documentation

## 2018-09-11 LAB — PULMONARY FUNCTION TEST
DL/VA % pred: 39 %
DL/VA: 1.67 ml/min/mmHg/L
DLCO unc % pred: 22 %
DLCO unc: 5.61 ml/min/mmHg
FEF 25-75 Post: 1.5 L/sec
FEF 25-75 Pre: 1.18 L/sec
FEF2575-%CHANGE-POST: 27 %
FEF2575-%PRED-POST: 56 %
FEF2575-%Pred-Pre: 44 %
FEV1-%Change-Post: 6 %
FEV1-%Pred-Post: 63 %
FEV1-%Pred-Pre: 59 %
FEV1-Post: 2.07 L
FEV1-Pre: 1.93 L
FEV1FVC-%CHANGE-POST: 2 %
FEV1FVC-%PRED-PRE: 92 %
FEV6-%Change-Post: 3 %
FEV6-%Pred-Post: 68 %
FEV6-%Pred-Pre: 66 %
FEV6-Post: 2.83 L
FEV6-Pre: 2.73 L
FEV6FVC-%Change-Post: 0 %
FEV6FVC-%Pred-Post: 102 %
FEV6FVC-%Pred-Pre: 103 %
FVC-%CHANGE-POST: 4 %
FVC-%Pred-Post: 66 %
FVC-%Pred-Pre: 63 %
FVC-PRE: 2.77 L
FVC-Post: 2.89 L
Post FEV1/FVC ratio: 71 %
Post FEV6/FVC ratio: 98 %
Pre FEV1/FVC ratio: 70 %
Pre FEV6/FVC Ratio: 99 %
RV % pred: 69 %
RV: 1.53 L
TLC % pred: 65 %
TLC: 4.3 L

## 2018-09-11 MED ORDER — ALBUTEROL SULFATE (2.5 MG/3ML) 0.083% IN NEBU
2.5000 mg | INHALATION_SOLUTION | Freq: Once | RESPIRATORY_TRACT | Status: AC
  Administered 2018-09-11: 2.5 mg via RESPIRATORY_TRACT

## 2018-09-14 DIAGNOSIS — R062 Wheezing: Secondary | ICD-10-CM | POA: Diagnosis not present

## 2018-09-14 DIAGNOSIS — G4733 Obstructive sleep apnea (adult) (pediatric): Secondary | ICD-10-CM | POA: Diagnosis not present

## 2018-09-14 DIAGNOSIS — J1520 Pneumonia due to staphylococcus, unspecified: Secondary | ICD-10-CM | POA: Diagnosis not present

## 2018-09-14 DIAGNOSIS — I509 Heart failure, unspecified: Secondary | ICD-10-CM | POA: Diagnosis not present

## 2018-09-17 ENCOUNTER — Encounter: Payer: Self-pay | Admitting: Cardiology

## 2018-09-17 DIAGNOSIS — J8410 Pulmonary fibrosis, unspecified: Secondary | ICD-10-CM | POA: Diagnosis not present

## 2018-09-17 DIAGNOSIS — J449 Chronic obstructive pulmonary disease, unspecified: Secondary | ICD-10-CM | POA: Diagnosis not present

## 2018-09-17 DIAGNOSIS — J9611 Chronic respiratory failure with hypoxia: Secondary | ICD-10-CM | POA: Diagnosis not present

## 2018-09-17 NOTE — Progress Notes (Signed)
Remote ICD transmission.   

## 2018-09-23 ENCOUNTER — Encounter: Payer: Self-pay | Admitting: Cardiology

## 2018-09-25 DIAGNOSIS — R062 Wheezing: Secondary | ICD-10-CM | POA: Diagnosis not present

## 2018-09-25 DIAGNOSIS — I5022 Chronic systolic (congestive) heart failure: Secondary | ICD-10-CM | POA: Diagnosis not present

## 2018-09-25 DIAGNOSIS — I509 Heart failure, unspecified: Secondary | ICD-10-CM | POA: Diagnosis not present

## 2018-09-25 DIAGNOSIS — G4733 Obstructive sleep apnea (adult) (pediatric): Secondary | ICD-10-CM | POA: Diagnosis not present

## 2018-09-26 ENCOUNTER — Telehealth: Payer: Self-pay | Admitting: *Deleted

## 2018-09-26 NOTE — Telephone Encounter (Signed)
   Cardiac Questionnaire:    Since your last visit or hospitalization:    1. Have you been having new or worsening chest pain? No   2. Have you been having new or worsening shortness of breath? No 3. Have you been having new or worsening leg swelling, wt gain, or increase in abdominal girth (pants fitting more tightly)? No   4. Have you had any passing out spells? NO    *A YES to any of these questions would result in the appointment being kept. *If all the answers to these questions are NO, we should indicate that given the current situation regarding the worldwide coronarvirus pandemic, at the recommendation of the CDC, we are looking to limit gatherings in our waiting area, and thus will reschedule their appointment beyond four weeks from today.

## 2018-09-28 DIAGNOSIS — R062 Wheezing: Secondary | ICD-10-CM | POA: Diagnosis not present

## 2018-09-28 DIAGNOSIS — I509 Heart failure, unspecified: Secondary | ICD-10-CM | POA: Diagnosis not present

## 2018-09-28 DIAGNOSIS — G4733 Obstructive sleep apnea (adult) (pediatric): Secondary | ICD-10-CM | POA: Diagnosis not present

## 2018-09-28 DIAGNOSIS — I5022 Chronic systolic (congestive) heart failure: Secondary | ICD-10-CM | POA: Diagnosis not present

## 2018-10-01 ENCOUNTER — Encounter: Payer: 59 | Admitting: Internal Medicine

## 2018-10-07 DIAGNOSIS — G894 Chronic pain syndrome: Secondary | ICD-10-CM | POA: Diagnosis not present

## 2018-10-07 DIAGNOSIS — J449 Chronic obstructive pulmonary disease, unspecified: Secondary | ICD-10-CM | POA: Diagnosis not present

## 2018-10-07 DIAGNOSIS — E7849 Other hyperlipidemia: Secondary | ICD-10-CM | POA: Diagnosis not present

## 2018-10-07 DIAGNOSIS — Z681 Body mass index (BMI) 19 or less, adult: Secondary | ICD-10-CM | POA: Diagnosis not present

## 2018-10-10 ENCOUNTER — Other Ambulatory Visit: Payer: Self-pay | Admitting: Cardiology

## 2018-10-11 DIAGNOSIS — F419 Anxiety disorder, unspecified: Secondary | ICD-10-CM | POA: Diagnosis not present

## 2018-10-11 DIAGNOSIS — J449 Chronic obstructive pulmonary disease, unspecified: Secondary | ICD-10-CM | POA: Diagnosis not present

## 2018-10-11 DIAGNOSIS — Z0001 Encounter for general adult medical examination with abnormal findings: Secondary | ICD-10-CM | POA: Diagnosis not present

## 2018-10-11 DIAGNOSIS — I2510 Atherosclerotic heart disease of native coronary artery without angina pectoris: Secondary | ICD-10-CM | POA: Diagnosis not present

## 2018-10-11 DIAGNOSIS — Z1389 Encounter for screening for other disorder: Secondary | ICD-10-CM | POA: Diagnosis not present

## 2018-10-15 DIAGNOSIS — R0689 Other abnormalities of breathing: Secondary | ICD-10-CM | POA: Diagnosis not present

## 2018-10-15 DIAGNOSIS — J1520 Pneumonia due to staphylococcus, unspecified: Secondary | ICD-10-CM | POA: Diagnosis not present

## 2018-10-15 DIAGNOSIS — I509 Heart failure, unspecified: Secondary | ICD-10-CM | POA: Diagnosis not present

## 2018-10-15 DIAGNOSIS — I499 Cardiac arrhythmia, unspecified: Secondary | ICD-10-CM | POA: Diagnosis not present

## 2018-10-15 DIAGNOSIS — G4733 Obstructive sleep apnea (adult) (pediatric): Secondary | ICD-10-CM | POA: Diagnosis not present

## 2018-10-15 DIAGNOSIS — R404 Transient alteration of awareness: Secondary | ICD-10-CM | POA: Diagnosis not present

## 2018-10-15 DIAGNOSIS — R062 Wheezing: Secondary | ICD-10-CM | POA: Diagnosis not present

## 2018-10-15 DIAGNOSIS — R001 Bradycardia, unspecified: Secondary | ICD-10-CM | POA: Diagnosis not present

## 2018-10-25 DIAGNOSIS — 419620001 Death: Secondary | SNOMED CT | POA: Diagnosis not present

## 2018-10-25 DEATH — deceased

## 2018-11-19 ENCOUNTER — Ambulatory Visit: Payer: Medicare HMO | Admitting: Cardiology

## 2018-12-06 ENCOUNTER — Ambulatory Visit: Payer: Self-pay | Admitting: Urology

## 2018-12-18 ENCOUNTER — Encounter: Payer: Medicare HMO | Admitting: Internal Medicine
